# Patient Record
Sex: Male | Born: 1950 | Race: White | Hispanic: No | Marital: Married | Smoking: Former smoker
Health system: Southern US, Community
[De-identification: ages and names within clinical notes are randomized; demographics above are authoritative.]

## PROBLEM LIST (undated history)

## (undated) DIAGNOSIS — M1990 Unspecified osteoarthritis, unspecified site: Secondary | ICD-10-CM

## (undated) DIAGNOSIS — I639 Cerebral infarction, unspecified: Secondary | ICD-10-CM

## (undated) DIAGNOSIS — I10 Essential (primary) hypertension: Secondary | ICD-10-CM

## (undated) DIAGNOSIS — G629 Polyneuropathy, unspecified: Secondary | ICD-10-CM

## (undated) DIAGNOSIS — G4730 Sleep apnea, unspecified: Secondary | ICD-10-CM

## (undated) DIAGNOSIS — Z8739 Personal history of other diseases of the musculoskeletal system and connective tissue: Secondary | ICD-10-CM

## (undated) DIAGNOSIS — E119 Type 2 diabetes mellitus without complications: Secondary | ICD-10-CM

## (undated) DIAGNOSIS — I4891 Unspecified atrial fibrillation: Secondary | ICD-10-CM

## (undated) DIAGNOSIS — I213 ST elevation (STEMI) myocardial infarction of unspecified site: Secondary | ICD-10-CM

## (undated) DIAGNOSIS — E7800 Pure hypercholesterolemia, unspecified: Secondary | ICD-10-CM

## (undated) DIAGNOSIS — I472 Ventricular tachycardia, unspecified: Secondary | ICD-10-CM

## (undated) DIAGNOSIS — H3320 Serous retinal detachment, unspecified eye: Secondary | ICD-10-CM

## (undated) DIAGNOSIS — I739 Peripheral vascular disease, unspecified: Secondary | ICD-10-CM

## (undated) DIAGNOSIS — C439 Malignant melanoma of skin, unspecified: Secondary | ICD-10-CM

## (undated) DIAGNOSIS — M549 Dorsalgia, unspecified: Secondary | ICD-10-CM

## (undated) DIAGNOSIS — F419 Anxiety disorder, unspecified: Secondary | ICD-10-CM

## (undated) DIAGNOSIS — N189 Chronic kidney disease, unspecified: Secondary | ICD-10-CM

## (undated) DIAGNOSIS — I5021 Acute systolic (congestive) heart failure: Secondary | ICD-10-CM

## (undated) DIAGNOSIS — I2510 Atherosclerotic heart disease of native coronary artery without angina pectoris: Secondary | ICD-10-CM

## (undated) DIAGNOSIS — I219 Acute myocardial infarction, unspecified: Secondary | ICD-10-CM

## (undated) HISTORY — DX: Malignant melanoma of skin, unspecified: C43.9

## (undated) HISTORY — PX: APPENDECTOMY: SHX54

## (undated) HISTORY — DX: Peripheral vascular disease, unspecified: I73.9

## (undated) HISTORY — PX: RETINAL DETACHMENT SURGERY: SHX105

## (undated) HISTORY — DX: Serous retinal detachment, unspecified eye: H33.20

---

## 2004-07-25 ENCOUNTER — Ambulatory Visit (HOSPITAL_COMMUNITY): Admission: RE | Admit: 2004-07-25 | Discharge: 2004-07-25 | Payer: Self-pay | Admitting: Family Medicine

## 2005-04-05 ENCOUNTER — Ambulatory Visit (HOSPITAL_COMMUNITY): Admission: RE | Admit: 2005-04-05 | Discharge: 2005-04-05 | Payer: Self-pay | Admitting: Family Medicine

## 2007-01-28 ENCOUNTER — Emergency Department (HOSPITAL_COMMUNITY): Admission: EM | Admit: 2007-01-28 | Discharge: 2007-01-28 | Payer: Self-pay | Admitting: Emergency Medicine

## 2010-06-19 NOTE — Consult Note (Signed)
Xavier Caldwell, Xavier NO.:  [id number]   MEDICAL RECORD NO.:  [id number]          PATIENT TYPE:  EMS   LOCATION:  ED                            FACILITY:  [facility]   PHYSICIAN:  Dalia Heading, M.D.  DATE OF BIRTH:  May 27, 1950   DATE OF CONSULTATION:  01/28/2007  DATE OF DISCHARGE:                                 CONSULTATION   AGE:  60 years old.   REASON FOR CONSULTATION:  Perirectal abscess.   REFERRING PHYSICIAN:  [facility].   HISTORY OF PRESENT ILLNESS:  The patient is a 60 year old white male who  presented in the emergency room with an enlarging perirectal abscess.  He had been seen by Dr. Phillips Odor in the office earlier this week.  He  states his blood sugars have been elevated recently as he has not been  taking his medications.   PAST MEDICAL AND SURGICAL HISTORY:  Are noted on the chart.   LABORATORY DATA:  Noted on the chart.   PHYSICAL EXAMINATION:  On examination, a large perirectal abscess is  noted on the left side of the anus.   PROCEDURE NOTE:  Informed consent was obtained.  Incision and drainage  of the abscess was performed.  Cultures were taken.  The abscess cavity  was fully evacuated.  A dressing was then applied.  This was performed  using a topical anesthetic and prepped with Betadine.  He tolerated the  procedure well.   IMPRESSION:  Perirectal abscess.   PLAN:  Patient has been told to soak in a tub with Epsom salt twice a  day.  He will be started on:  1. Ciprofloxacin 500 mg p.o. b.i.d. x10 days.  2. Flagyl 250 mg p.o. 3 times a day x10 days.  3. Vicodin 1-2 tablets p.o. q.4 h p.r.n. pain.   He is to follow-up Dr. Franky Macho on February 03, 2007.  He is to  return to the emergency room should his condition worsen.      Dalia Heading, M.D.  Electronically Signed     MAJ/MEDQ  D:  01/28/2007  T:  01/28/2007  Job:  [id number]   cc:   Corrie Mckusick, M.D.  Fax: [phone]

## 2010-11-09 LAB — BASIC METABOLIC PANEL
CO2: 16 — ABNORMAL LOW
Calcium: 9.2
Chloride: 98
Creatinine, Ser: 1.24
GFR calc Af Amer: >60
Glucose, Bld: 323 — ABNORMAL HIGH
Potassium: 3 — ABNORMAL LOW

## 2010-11-09 LAB — BLOOD GAS, ARTERIAL
Acid-base deficit: 9 — ABNORMAL HIGH
Bicarbonate: 14.9 — ABNORMAL LOW
O2 Saturation: 97.7
TCO2: 13.1
pO2, Arterial: 105 — ABNORMAL HIGH

## 2010-11-09 LAB — CBC
Hemoglobin: 14.8
MCHC: 34.4
RBC: 4.85
WBC: 15.5 — ABNORMAL HIGH

## 2010-11-09 LAB — CULTURE, ROUTINE-ABSCESS

## 2010-11-09 LAB — DIFFERENTIAL
Basophils Absolute: 0
Basophils Relative: 0
Eosinophils Absolute: 0 — ABNORMAL LOW
Eosinophils Relative: 0
Lymphocytes Relative: 11 — ABNORMAL LOW

## 2012-02-09 ENCOUNTER — Encounter (HOSPITAL_COMMUNITY): Payer: Self-pay | Admitting: Emergency Medicine

## 2012-02-09 ENCOUNTER — Emergency Department (HOSPITAL_COMMUNITY)
Admission: EM | Admit: 2012-02-09 | Discharge: 2012-02-09 | Disposition: A | Discharge status: Home / Self Care | Payer: BC Managed Care – PPO | Attending: Emergency Medicine | Admitting: Emergency Medicine

## 2012-02-09 DIAGNOSIS — Z79899 Other long term (current) drug therapy: Secondary | ICD-10-CM | POA: Insufficient documentation

## 2012-02-09 DIAGNOSIS — E7800 Pure hypercholesterolemia, unspecified: Secondary | ICD-10-CM | POA: Insufficient documentation

## 2012-02-09 DIAGNOSIS — E119 Type 2 diabetes mellitus without complications: Secondary | ICD-10-CM | POA: Insufficient documentation

## 2012-02-09 DIAGNOSIS — M5430 Sciatica, unspecified side: Secondary | ICD-10-CM | POA: Insufficient documentation

## 2012-02-09 DIAGNOSIS — M545 Low back pain, unspecified: Secondary | ICD-10-CM | POA: Insufficient documentation

## 2012-02-09 DIAGNOSIS — F17200 Nicotine dependence, unspecified, uncomplicated: Secondary | ICD-10-CM | POA: Insufficient documentation

## 2012-02-09 HISTORY — DX: Pure hypercholesterolemia, unspecified: E78.00

## 2012-02-09 HISTORY — DX: Type 2 diabetes mellitus without complications: E11.9

## 2012-02-09 MED ORDER — PREDNISONE 50 MG PO TABS
60.0000 mg | ORAL_TABLET | Freq: Once | ORAL | Status: AC
  Administered 2012-02-09: 60 mg via ORAL
  Filled 2012-02-09: qty 1

## 2012-02-09 MED ORDER — PREDNISONE 10 MG PO TABS: 20.0000 mg | ORAL_TABLET | Freq: Every day | ORAL | Status: DC

## 2012-02-09 MED ORDER — HYDROCODONE-ACETAMINOPHEN 5-325 MG PO TABS: 1.0000 | ORAL_TABLET | ORAL | Status: AC | PRN

## 2012-02-09 MED ORDER — HYDROCODONE-ACETAMINOPHEN 5-325 MG PO TABS
2.0000 | ORAL_TABLET | Freq: Once | ORAL | Status: AC
  Administered 2012-02-09: 2 via ORAL
  Filled 2012-02-09: qty 2

## 2012-02-09 NOTE — ED Notes (Signed)
Patient states he was lifting a TV onto a blanket and felt a pain in the right side of his lower back.  Patient states he went to work and worked 12 hours; states pain has gotten progressively worse.

## 2012-02-09 NOTE — ED Provider Notes (Signed)
History     CSN: [id number]  Arrival date & time 02/09/12  0405   First MD Initiated Contact with Patient 02/09/12 [phone]      Chief Complaint  Patient presents with  . Back Pain    (Consider location/radiation/quality/duration/timing/severity/associated sxs/prior treatment) HPI Xavier Caldwell is a 62 y.o. male who presents to the Emergency Department complaining of back pain that began on Friday when he tried to lift a TV. Friday he had right sided back pain and was able to go to work. When he tried to get out of bed Saturday morning he had a great deal of difficulty due to the pain. Pain extends across his lower back and goes down his right buttock and leg. Pain is sharp and increases with walking. Nothing makes it better.    Past Medical History  Diagnosis Date  . Diabetes mellitus without complication   . High cholesterol     Past Surgical History  Procedure Date  . Appendectomy     No family history on file.  History  Substance Use Topics  . Smoking status: Current Every Day Smoker  . Smokeless tobacco: Not on file  . Alcohol Use: Yes      Review of Systems  Constitutional: Negative for fever.       10 Systems reviewed and are negative for acute change except as noted in the HPI.  HENT: Negative for congestion.   Eyes: Negative for discharge and redness.  Respiratory: Negative for cough and shortness of breath.   Cardiovascular: Negative for chest pain.  Gastrointestinal: Negative for vomiting and abdominal pain.  Musculoskeletal: Positive for back pain.  Skin: Negative for rash.  Neurological: Negative for syncope, numbness and headaches.  Psychiatric/Behavioral:       No behavior change.    Allergies  Review of patient's allergies indicates no known allergies.  Home Medications   Current Outpatient Rx  Name  Route  Sig  Dispense  Refill  . GLYBURIDE-METFORMIN 2.5-500 MG PO TABS   Oral   Take 1 tablet by mouth daily with breakfast.         .  LISINOPRIL 20 MG PO TABS   Oral   Take 20 mg by mouth daily.         Marland Kitchen SIMVASTATIN 10 MG PO TABS   Oral   Take 10 mg by mouth at bedtime.           BP 154/77  Pulse 118  Temp 97.7 F (36.5 C) (Oral)  Resp 20  Ht 5\' 11"  (1.803 m)  Wt 217 lb (98.431 kg)  BMI 30.27 kg/m2  SpO2 98%  Physical Exam  Nursing note and vitals reviewed. Constitutional: He appears well-developed and well-nourished.       Awake, alert, nontoxic appearance.  HENT:  Head: Atraumatic.  Eyes: Right eye exhibits no discharge. Left eye exhibits no discharge.  Neck: Neck supple.  Pulmonary/Chest: Effort normal and breath sounds normal. He exhibits no tenderness.  Abdominal: Soft. Bowel sounds are normal. There is no tenderness. There is no rebound.  Musculoskeletal: He exhibits no tenderness.       Baseline ROM, no obvious new focal weakness.No spinal tenderness to percussion. Mild lumbar paraspinal muscle tenderness with palpation, right greater than left. Positive straight leg raise on the right.  Neurological:       Mental status and motor strength appears baseline for patient and situation.  Skin: No rash noted.  Psychiatric: He has a normal mood and affect.  ED Course  Procedures (including critical care time)     MDM  Patient presents with lower back pain and sciatica after trying to lift a TV. Initiated steroid therapy and was given an analgesic. Pt stable in ED with no significant deterioration in condition.The patient appears reasonably screened and/or stabilized for discharge and I doubt any other medical condition or other [facility] requiring further screening, evaluation, or treatment in the ED at this time prior to discharge.  MDM Reviewed: nursing note and vitals           Nicoletta Dress. Colon Branch, MD 02/09/12 [phone]

## 2012-03-24 ENCOUNTER — Ambulatory Visit (HOSPITAL_COMMUNITY)
Admission: RE | Admit: 2012-03-24 | Discharge: 2012-03-24 | Disposition: A | Discharge status: Home / Self Care | Payer: BC Managed Care – PPO | Source: Ambulatory Visit | Attending: Family Medicine | Admitting: Family Medicine

## 2012-03-24 ENCOUNTER — Other Ambulatory Visit (HOSPITAL_COMMUNITY): Payer: Self-pay | Admitting: Family Medicine

## 2012-03-24 DIAGNOSIS — M545 Low back pain, unspecified: Secondary | ICD-10-CM

## 2012-04-03 ENCOUNTER — Ambulatory Visit (HOSPITAL_COMMUNITY): Payer: BC Managed Care – PPO

## 2012-04-03 ENCOUNTER — Other Ambulatory Visit (HOSPITAL_COMMUNITY): Payer: Self-pay | Admitting: Orthopedic Surgery

## 2012-04-03 DIAGNOSIS — M545 Low back pain, unspecified: Secondary | ICD-10-CM

## 2012-04-06 ENCOUNTER — Ambulatory Visit (HOSPITAL_COMMUNITY)
Admission: RE | Admit: 2012-04-06 | Discharge: 2012-04-06 | Disposition: A | Discharge status: Home / Self Care | Payer: BC Managed Care – PPO | Source: Ambulatory Visit | Attending: Orthopedic Surgery | Admitting: Orthopedic Surgery

## 2012-04-06 DIAGNOSIS — M545 Low back pain, unspecified: Secondary | ICD-10-CM | POA: Insufficient documentation

## 2012-04-06 DIAGNOSIS — M5126 Other intervertebral disc displacement, lumbar region: Secondary | ICD-10-CM | POA: Insufficient documentation

## 2012-04-06 DIAGNOSIS — M79609 Pain in unspecified limb: Secondary | ICD-10-CM | POA: Insufficient documentation

## 2014-01-19 ENCOUNTER — Encounter: Payer: BC Managed Care – PPO | Attending: "Endocrinology | Admitting: Nutrition

## 2014-01-19 VITALS — Ht 71.0 in | Wt 199.0 lb

## 2014-01-19 DIAGNOSIS — E1165 Type 2 diabetes mellitus with hyperglycemia: Secondary | ICD-10-CM

## 2014-01-19 DIAGNOSIS — IMO0002 Reserved for concepts with insufficient information to code with codable children: Secondary | ICD-10-CM

## 2014-01-19 DIAGNOSIS — E118 Type 2 diabetes mellitus with unspecified complications: Secondary | ICD-10-CM | POA: Diagnosis present

## 2014-01-19 DIAGNOSIS — Z713 Dietary counseling and surveillance: Secondary | ICD-10-CM | POA: Insufficient documentation

## 2014-01-19 NOTE — Progress Notes (Signed)
  Medical Nutrition Therapy:  Appt start time: 1430 end time:  1610.   Assessment:  Primary concerns today: Diabetes.  Most recent A1C was 8.6% today. He comes in with his wife. Has back problems that limits his physical movement. He is taking 2 of of Metformin daily and 300 mg of Invokana. Occasionally gets dizzy at times. Complains of going to the bathroom a lot and is drinking a lot of water to replace fluid losses from [place]. Had been on prednisone in the recent past and may have contributed to elevated A1C. Past A1C was 7.4%. Sees Dr. [name] for DM. Is trying to eat right but his blood sugars have not improved he noted.  Preferred Learning Style:     No preference indicated   Learning Readiness:     Ready  Change in progress   MEDICATIONS: See list   DIETARY INTAKE:  24-hr recall:  B ( AM): 1 egg, 1 slice toast and 1 slice bacon, water, coffee Snk ( AM): none  L ( PM): ham sandwich and carrots, water Snk ( PM)none D ( PM): hamburger patty, green beans, carrots, water Snk ( PM): fruit sometimes Beverages: water  Usual physical activity:Very limited due to back pain  Estimated energy needs: 1800 calories 180 g carbohydrates 135 g protein 50 g fat  Progress Towards Goal(s):  In progress.   Nutritional Diagnosis:  NB-1.1 Food and nutrition-related knowledge deficit As related to Diabetes.  As evidenced by A1C 8.6%.    Intervention:  Nutrition Counseling for diabetes, target ranges for blood sugars, meal planning, and complications related to DM. Plan:  Aim for 3-4 Carb Choices per meal (45-60 grams) +/- 1 either way  Avoid snacks between meals  Include protein in moderation with your meals and snacks Consider reading food labels for Total Carbohydrate and Fat Grams of foods Try to do chair exercises as tolerated Check blood sugars fasting and 2 hours after largest meal daily and record on BS log. Keep a food journal and bring at next visit. Consider taking  medication of Metformin and Invokana as directed by MD Goal: Get A1C down to 7%  or lower in three months.    Teaching Method Utilized:  Visual Auditory Hands on  Handouts given during visit include: The Plate Method Carb Counting and Food Label handouts Meal Plan Card   Barriers to learning/adherence to lifestyle change: back problems limits physical activity.  Demonstrated degree of understanding via:  Teach Back   Monitoring/Evaluation:  Dietary intake, exercise, meal planning, SBG, and body weight in 1 month(s).

## 2014-01-20 NOTE — Patient Instructions (Signed)
Plan:  Aim for 3-4 Carb Choices per meal (45-60 grams) +/- 1 either way  Avoid snacks between meals  Include protein in moderation with your meals and snacks Consider reading food labels for Total Carbohydrate and Fat Grams of foods Try to do chair exercises as tolerated Check blood sugars fasting and 2 hours after largest meal daily and record on BS log. Keep a food journal and bring at next visit. Consider taking medication of Metformin and Invokana as directed by MD Goal: Get A1C down to 7%  or lower in three months

## 2014-01-21 ENCOUNTER — Ambulatory Visit: Payer: BC Managed Care – PPO | Admitting: Nutrition

## 2014-02-24 ENCOUNTER — Ambulatory Visit: Payer: BC Managed Care – PPO | Admitting: Nutrition

## 2014-03-10 ENCOUNTER — Telehealth: Payer: Self-pay | Admitting: Neurology

## 2014-03-10 NOTE — Telephone Encounter (Signed)
Pt canceled new patient appt and does not feel like he needs to be seen at this time notified the referring dr

## 2014-04-19 ENCOUNTER — Encounter (HOSPITAL_COMMUNITY): Payer: Self-pay

## 2014-04-19 ENCOUNTER — Other Ambulatory Visit: Payer: Self-pay

## 2014-04-19 ENCOUNTER — Encounter (HOSPITAL_COMMUNITY)
Admission: RE | Admit: 2014-04-19 | Discharge: 2014-04-19 | Disposition: A | Discharge status: Home / Self Care | Payer: 59 | Source: Ambulatory Visit | Attending: Ophthalmology | Admitting: Ophthalmology

## 2014-04-19 DIAGNOSIS — Z01810 Encounter for preprocedural cardiovascular examination: Secondary | ICD-10-CM | POA: Insufficient documentation

## 2014-04-19 DIAGNOSIS — Z01812 Encounter for preprocedural laboratory examination: Secondary | ICD-10-CM | POA: Diagnosis not present

## 2014-04-19 DIAGNOSIS — H269 Unspecified cataract: Secondary | ICD-10-CM | POA: Insufficient documentation

## 2014-04-19 HISTORY — DX: Sleep apnea, unspecified: G47.30

## 2014-04-19 HISTORY — DX: Polyneuropathy, unspecified: G62.9

## 2014-04-19 HISTORY — DX: Dorsalgia, unspecified: M54.9

## 2014-04-19 HISTORY — DX: Essential (primary) hypertension: I10

## 2014-04-19 LAB — BASIC METABOLIC PANEL
Anion gap: 11 (ref 5–15)
BUN: 14 mg/dL (ref 6–23)
CALCIUM: 9.6 mg/dL (ref 8.4–10.5)
CHLORIDE: 103 mmol/L (ref 96–112)
CO2: 22 mmol/L (ref 19–32)
CREATININE: 0.66 mg/dL (ref 0.50–1.35)
GFR calc non Af Amer: >90 mL/min (ref >90)
Glucose, Bld: 123 mg/dL — ABNORMAL HIGH (ref 70–99)
Potassium: 4.5 mmol/L (ref 3.5–5.1)
Sodium: 136 mmol/L (ref 135–145)

## 2014-04-19 LAB — CBC
HCT: 43.6 % (ref 39.0–52.0)
Hemoglobin: 14.9 g/dL (ref 13.0–17.0)
MCH: 30.4 pg (ref 26.0–34.0)
MCHC: 34.2 g/dL (ref 30.0–36.0)
MCV: 89 fL (ref 78.0–100.0)
PLATELETS: 225 10*3/uL (ref 150–400)
RBC: 4.9 MIL/uL (ref 4.22–5.81)
RDW: 11.9 % (ref 11.5–15.5)
WBC: 12.1 10*3/uL — ABNORMAL HIGH (ref 4.0–10.5)

## 2014-04-19 NOTE — Progress Notes (Signed)
   04/19/14 1451  OBSTRUCTIVE SLEEP APNEA  Have you ever been diagnosed with sleep apnea through a sleep study? No  Do you snore loudly (loud enough to be heard through closed doors)?  0  Do you often feel tired, fatigued, or sleepy during the daytime? 1  Has anyone observed you stop breathing during your sleep? 0  Do you have, or are you being treated for high blood pressure? 1  BMI more than 35 kg/m2? 0  Age over 62 years old? 1  Neck circumference greater than 40 cm/16 inches? 0  Gender: 1  Obstructive Sleep Apnea Score 4

## 2014-04-19 NOTE — Patient Instructions (Signed)
Your procedure is scheduled on: 04/25/2014  Report to [facility] at  800  AM.  Call this number if you have problems the morning of surgery: [phone]   Do not eat food or drink liquids :After Midnight.      Take these medicines the morning of surgery with A SIP OF WATER: cymbalta, hydrocodone, lisinopril, lyrica   Do not wear jewelry, make-up or nail polish.  Do not wear lotions, powders, or perfumes.  Do not shave 48 hours prior to surgery.  Do not bring valuables to the hospital.  Contacts, dentures or bridgework may not be worn into surgery.  Leave suitcase in the car. After surgery it may be brought to your room.  For patients admitted to the hospital, checkout time is 11:00 AM the day of discharge.   Patients discharged the day of surgery will not be allowed to drive home.  :     Please read over the following fact sheets that you were given: Coughing and Deep Breathing, Surgical Site Infection Prevention, Anesthesia Post-op Instructions and Care and Recovery After Surgery    Cataract A cataract is a clouding of the lens of the eye. When a lens becomes cloudy, vision is reduced based on the degree and nature of the clouding. Many cataracts reduce vision to some degree. Some cataracts make people more near-sighted as they develop. Other cataracts increase glare. Cataracts that are ignored and become worse can sometimes look white. The white color can be seen through the pupil. CAUSES   Aging. However, cataracts may occur at any age, even in newborns.   Certain drugs.   Trauma to the eye.   Certain diseases such as diabetes.   Specific eye diseases such as chronic inflammation inside the eye or a sudden attack of a rare form of glaucoma.   Inherited or acquired medical problems.  SYMPTOMS   Gradual, progressive drop in vision in the affected eye.   Severe, rapid visual loss. This most often happens when trauma is the cause.  DIAGNOSIS  To detect a cataract, an eye  doctor examines the lens. Cataracts are best diagnosed with an exam of the eyes with the pupils enlarged (dilated) by drops.  TREATMENT  For an early cataract, vision may improve by using different eyeglasses or stronger lighting. If that does not help your vision, surgery is the only effective treatment. A cataract needs to be surgically removed when vision loss interferes with your everyday activities, such as driving, reading, or watching TV. A cataract may also have to be removed if it prevents examination or treatment of another eye problem. Surgery removes the cloudy lens and usually replaces it with a substitute lens (intraocular lens, IOL).  At a time when both you and your doctor agree, the cataract will be surgically removed. If you have cataracts in both eyes, only one is usually removed at a time. This allows the operated eye to heal and be out of danger from any possible problems after surgery (such as infection or poor wound healing). In rare cases, a cataract may be doing damage to your eye. In these cases, your caregiver may advise surgical removal right away. The vast majority of people who have cataract surgery have better vision afterward. HOME CARE INSTRUCTIONS  If you are not planning surgery, you may be asked to do the following:  Use different eyeglasses.   Use stronger or brighter lighting.   Ask your eye doctor about reducing your medicine dose or changing  medicines if it is thought that a medicine caused your cataract. Changing medicines does not make the cataract go away on its own.   Become familiar with your surroundings. Poor vision can lead to injury. Avoid bumping into things on the affected side. You are at a higher risk for tripping or falling.   Exercise extreme care when driving or operating machinery.   Wear sunglasses if you are sensitive to bright light or experiencing problems with glare.  SEEK IMMEDIATE MEDICAL CARE IF:   You have a worsening or sudden  vision loss.   You notice redness, swelling, or increasing pain in the eye.   You have a fever.  Document Released: 01/21/2005 Document Revised: 01/10/2011 Document Reviewed: 09/14/2010 [facility] Patient Information 2012 [place].PATIENT INSTRUCTIONS POST-ANESTHESIA  IMMEDIATELY FOLLOWING SURGERY:  Do not drive or operate machinery for the first twenty four hours after surgery.  Do not make any important decisions for twenty four hours after surgery or while taking narcotic pain medications or sedatives.  If you develop intractable nausea and vomiting or a severe headache please notify your doctor immediately.  FOLLOW-UP:  Please make an appointment with your surgeon as instructed. You do not need to follow up with anesthesia unless specifically instructed to do so.  WOUND CARE INSTRUCTIONS (if applicable):  Keep a dry clean dressing on the anesthesia/puncture wound site if there is drainage.  Once the wound has quit draining you may leave it open to air.  Generally you should leave the bandage intact for twenty four hours unless there is drainage.  If the epidural site drains for more than 36-48 hours please call the anesthesia department.  QUESTIONS?:  Please feel free to call your physician or the hospital operator if you have any questions, and they will be happy to assist you.

## 2014-04-22 MED ORDER — LIDOCAINE HCL 3.5 % OP GEL
OPHTHALMIC | Status: AC
  Filled 2014-04-22: qty 1

## 2014-04-22 MED ORDER — TETRACAINE HCL 0.5 % OP SOLN
OPHTHALMIC | Status: AC
  Filled 2014-04-22: qty 2

## 2014-04-22 MED ORDER — CYCLOPENTOLATE-PHENYLEPHRINE OP SOLN OPTIME - NO CHARGE
OPHTHALMIC | Status: AC
  Filled 2014-04-22: qty 2

## 2014-04-25 ENCOUNTER — Ambulatory Visit (HOSPITAL_COMMUNITY): Payer: 59 | Admitting: Anesthesiology

## 2014-04-25 ENCOUNTER — Encounter (HOSPITAL_COMMUNITY)
Admission: RE | Disposition: A | Discharge status: Home / Self Care | Payer: Self-pay | Source: Ambulatory Visit | Attending: Ophthalmology

## 2014-04-25 ENCOUNTER — Encounter (HOSPITAL_COMMUNITY): Payer: Self-pay | Admitting: *Deleted

## 2014-04-25 ENCOUNTER — Ambulatory Visit (HOSPITAL_COMMUNITY)
Admission: RE | Admit: 2014-04-25 | Discharge: 2014-04-25 | Disposition: A | Discharge status: Home / Self Care | Payer: 59 | Source: Ambulatory Visit | Attending: Ophthalmology | Admitting: Ophthalmology

## 2014-04-25 ENCOUNTER — Ambulatory Visit: Payer: Self-pay | Admitting: Neurology

## 2014-04-25 DIAGNOSIS — H2511 Age-related nuclear cataract, right eye: Secondary | ICD-10-CM | POA: Insufficient documentation

## 2014-04-25 DIAGNOSIS — Z87891 Personal history of nicotine dependence: Secondary | ICD-10-CM | POA: Insufficient documentation

## 2014-04-25 DIAGNOSIS — I10 Essential (primary) hypertension: Secondary | ICD-10-CM | POA: Insufficient documentation

## 2014-04-25 DIAGNOSIS — E119 Type 2 diabetes mellitus without complications: Secondary | ICD-10-CM | POA: Diagnosis not present

## 2014-04-25 HISTORY — PX: CATARACT EXTRACTION W/PHACO: SHX586

## 2014-04-25 LAB — GLUCOSE, CAPILLARY: GLUCOSE-CAPILLARY: 162 mg/dL — AB (ref 70–99)

## 2014-04-25 SURGERY — PHACOEMULSIFICATION, CATARACT, WITH IOL INSERTION
Anesthesia: Monitor Anesthesia Care | Site: Eye | Laterality: Right

## 2014-04-25 MED ORDER — EPINEPHRINE HCL 1 MG/ML IJ SOLN
INTRAMUSCULAR | Status: AC
  Filled 2014-04-25: qty 1

## 2014-04-25 MED ORDER — BSS IO SOLN
INTRAOCULAR | Status: DC | PRN
  Administered 2014-04-25: 15 mL

## 2014-04-25 MED ORDER — TETRACAINE HCL 0.5 % OP SOLN
1.0000 [drp] | OPHTHALMIC | Status: AC
  Administered 2014-04-25 (×3): 1 [drp] via OPHTHALMIC

## 2014-04-25 MED ORDER — NA HYALUR & NA CHOND-NA HYALUR 0.55-0.5 ML IO KIT
PACK | INTRAOCULAR | Status: DC | PRN
  Administered 2014-04-25: 1 via OPHTHALMIC

## 2014-04-25 MED ORDER — CYCLOPENTOLATE-PHENYLEPHRINE 0.2-1 % OP SOLN
1.0000 [drp] | OPHTHALMIC | Status: AC
  Administered 2014-04-25 (×3): 1 [drp] via OPHTHALMIC

## 2014-04-25 MED ORDER — FENTANYL CITRATE 0.05 MG/ML IJ SOLN
INTRAMUSCULAR | Status: AC
  Filled 2014-04-25: qty 2

## 2014-04-25 MED ORDER — MIDAZOLAM HCL 2 MG/2ML IJ SOLN
1.0000 mg | INTRAMUSCULAR | Status: DC | PRN
  Administered 2014-04-25: 2 mg via INTRAVENOUS

## 2014-04-25 MED ORDER — MIDAZOLAM HCL 5 MG/5ML IJ SOLN
INTRAMUSCULAR | Status: DC | PRN
  Administered 2014-04-25 (×2): 1 mg via INTRAVENOUS

## 2014-04-25 MED ORDER — LIDOCAINE HCL 3.5 % OP GEL
OPHTHALMIC | Status: DC | PRN
  Administered 2014-04-25: 1 via OPHTHALMIC

## 2014-04-25 MED ORDER — MIDAZOLAM HCL 2 MG/2ML IJ SOLN
INTRAMUSCULAR | Status: AC
  Filled 2014-04-25: qty 2

## 2014-04-25 MED ORDER — TETRACAINE 0.5 % OP SOLN OPTIME - NO CHARGE
OPHTHALMIC | Status: DC | PRN
  Administered 2014-04-25: 1 [drp] via OPHTHALMIC

## 2014-04-25 MED ORDER — LACTATED RINGERS IV SOLN
INTRAVENOUS | Status: DC
  Administered 2014-04-25: 09:00:00 via INTRAVENOUS

## 2014-04-25 MED ORDER — POVIDONE-IODINE 5 % OP SOLN
OPHTHALMIC | Status: DC | PRN
  Administered 2014-04-25: 1 via OPHTHALMIC

## 2014-04-25 MED ORDER — PHENYLEPHRINE-KETOROLAC 1-0.3 % IO SOLN
Freq: Once | INTRAOCULAR | Status: AC
  Administered 2014-04-25: 500 mL via OPHTHALMIC

## 2014-04-25 MED ORDER — FENTANYL CITRATE 0.05 MG/ML IJ SOLN
25.0000 ug | INTRAMUSCULAR | Status: AC
  Administered 2014-04-25 (×2): 25 ug via INTRAVENOUS

## 2014-04-25 SURGICAL SUPPLY — 8 items
CLOTH BEACON ORANGE TIMEOUT ST (SAFETY) IMPLANT
GLOVE BIOGEL PI IND STRL 7.0 (GLOVE) IMPLANT
GLOVE BIOGEL PI INDICATOR 7.0 (GLOVE)
GLOVE EXAM NITRILE MD LF STRL (GLOVE) IMPLANT
INST SET CATARACT ~~LOC~~ (KITS) ×2 IMPLANT
LENS ALC ACRYL/TECN (Ophthalmic Related) ×2 IMPLANT
PAD ARMBOARD 7.5X6 YLW CONV (MISCELLANEOUS) IMPLANT
WATER STERILE IRR 250ML POUR (IV SOLUTION) IMPLANT

## 2014-04-25 NOTE — Discharge Instructions (Signed)
GIBBS NAUGLE 04/25/2014 Dr. Iona Hansen Post operative Instructions for Cataract Patients  These instructions are for Xavier Caldwell and pertain to the operative eye.  1.  Resume your normal diet and previous oral medicines.  2. Your Follow-up appointment is at Dr. Iona Hansen' office in [place] on 04/26/14 @ 9:15am  3. You may leave the hospital when your driver is present and your nurse releases you.  4. Begin Pred Forte (prednisolone acetate 1%), Acular LS (ketorolac tromethamine .4%) and Gatifloxacin 0.5% eye drops; 1 drop each 4 times daily to operative eye. Moxifloxacin 0.5% may be substituted for Gatifloxacin using the same instructions.  68. Page Dr. Iona Hansen via beeper [phone] for significant pain in or around operative eye that is not relieved by Tylenol.  6. If you took Plavix before surgery, restart it at the usual dose on the evening of surgery.  7. Wear dark glasses as necessary for excessive light sensitivity.  8. Do no forcefully rub you your operative eye.  9. Keep your operative eye dry for 1 week. You may gently clean your eyelids with a damp washcloth.  10. You may resume normal occupational activities in one week and resume driving as tolerated after the first post operative visit.  11. It is normal to have blurred vision and a scratchy sensation following surgery.  Dr. Iona Hansen: [phone]

## 2014-04-25 NOTE — Transfer of Care (Signed)
Immediate Anesthesia Transfer of Care Note  Patient: Xavier Caldwell  Procedure(s) Performed: Procedure(s) with comments: CATARACT EXTRACTION PHACO AND INTRAOCULAR LENS PLACEMENT (IOC) (Right) - CDE:4.70  Patient Location: Short Stay  Anesthesia Type:MAC  Level of Consciousness: awake  Airway & Oxygen Therapy: Patient Spontanous Breathing  Post-op Assessment: Report given to RN  Post vital signs: Reviewed  Last Vitals:  Filed Vitals:   04/25/14 0925  BP: 125/67  Pulse:   Temp:   Resp: 13    Complications: No apparent anesthesia complications

## 2014-04-25 NOTE — Anesthesia Postprocedure Evaluation (Signed)
  Anesthesia Post-op Note  Patient: Xavier Caldwell  Procedure(s) Performed: Procedure(s) with comments: CATARACT EXTRACTION PHACO AND INTRAOCULAR LENS PLACEMENT (IOC) (Right) - CDE:4.70  Patient Location: Short Stay  Anesthesia Type:MAC  Level of Consciousness: awake, alert  and oriented  Airway and Oxygen Therapy: Patient Spontanous Breathing  Post-op Pain: none  Post-op Assessment: Post-op Vital signs reviewed, Patient's Cardiovascular Status Stable, Respiratory Function Stable, Patent Airway and No signs of Nausea or vomiting  Post-op Vital Signs: Reviewed and stable  Last Vitals:  Filed Vitals:   04/25/14 0925  BP: 125/67  Pulse:   Temp:   Resp: 13    Complications: No apparent anesthesia complications

## 2014-04-25 NOTE — Anesthesia Preprocedure Evaluation (Signed)
Anesthesia Evaluation  Patient identified by MRN, date of birth, ID band Patient awake    Reviewed: Allergy & Precautions, NPO status , Patient's Chart, lab work & pertinent test results  Airway Mallampati: II  TM Distance: >3 FB     Dental  (+) Teeth Intact   Pulmonary former smoker,  breath sounds clear to auscultation        Cardiovascular hypertension, Pt. on medications Rhythm:Regular Rate:Normal     Neuro/Psych    GI/Hepatic negative GI ROS,   Endo/Other  diabetes, Well Controlled, Type 2, Oral Hypoglycemic Agents  Renal/GU      Musculoskeletal   Abdominal   Peds  Hematology   Anesthesia Other Findings   Reproductive/Obstetrics                             Anesthesia Physical Anesthesia Plan  ASA: II  Anesthesia Plan: MAC   Post-op Pain Management:    Induction: Intravenous  Airway Management Planned: Nasal Cannula  Additional Equipment:   Intra-op Plan:   Post-operative Plan:   Informed Consent: I have reviewed the patients History and Physical, chart, labs and discussed the procedure including the risks, benefits and alternatives for the proposed anesthesia with the patient or authorized representative who has indicated his/her understanding and acceptance.     Plan Discussed with:   Anesthesia Plan Comments:         Anesthesia Quick Evaluation

## 2014-04-25 NOTE — Op Note (Signed)
04/25/2014  10:09 AM  PATIENT:  Xavier Caldwell  64 y.o. male  PRE-OPERATIVE DIAGNOSIS:  nuclear cataract right eye  POST-OPERATIVE DIAGNOSIS:  nuclear cataract right eye  PROCEDURE:  Procedure(s): CATARACT EXTRACTION PHACO AND INTRAOCULAR LENS PLACEMENT ([place])  SURGEON:  Surgeon(s): Williams Che, MD  ASSISTANTS: Bonney Roussel, CST   ANESTHESIA STAFF: Anesthesiologist: Lerry Liner, MD CRNA: Ollen Bowl, CRNA  ANESTHESIA:   topical and MAC  REQUESTED LENS POWER: 9.5  LENS IMPLANT INFORMATION:  Alcon [id number]  S/n [id number]  Exp 07/2015  CUMULATIVE DISSIPATED ENERGY:not recorded  INDICATIONS:see scanned office H&P  OP FINDINGS:moderate NS  COMPLICATIONS:None  PROCEDURE:  The patient was brought to the operating room in good condition.  The operative eye was prepped and draped in the usual fashion for intraocular surgery.  Lidocaine gel was dropped onto the eye.  A 2.4 mm 10 O'clock near clear corneal stepped incision and a 12 O'clock stab incision were created.  Viscoat was instilled into the anterior chamber.  The 5 mm anterior capsulorhexis was performed with a bent needle cystotome and Utrata forceps.  The lens was hydrodissected and hydrodelineated with a cannula and balanced salt solution and rotated with a Kuglen hook.  Phacoemulsification was perfomed in the divide and conquer technique.  The remaining cortex was removed with I&A and the capsular surfaces polished as necessary.  Provisc was placed into the capsular bag and the lens inserted with the Alcon inserter.  The viscoelastic was removed with I&A and the lens "rocked" into position.  The wounds were hydrated and te anterior chamber was refilled with balanced salt solution.  The wounds were checked for leakage and rehydrated as necessary.  The lid speculum and drapes were removed and the patient was transported to short stay in good condition.

## 2014-04-25 NOTE — Brief Op Note (Signed)
04/25/2014  10:09 AM  PATIENT:  Xavier Caldwell  64 y.o. male  PRE-OPERATIVE DIAGNOSIS:  nuclear cataract right eye  POST-OPERATIVE DIAGNOSIS:  nuclear cataract right eye  PROCEDURE:  Procedure(s): CATARACT EXTRACTION PHACO AND INTRAOCULAR LENS PLACEMENT ([place])  SURGEON:  Surgeon(s): Williams Che, MD  ASSISTANTS: Bonney Roussel, CST   ANESTHESIA STAFF: Anesthesiologist: Lerry Liner, MD CRNA: Ollen Bowl, CRNA  ANESTHESIA:   topical and MAC  REQUESTED LENS POWER: 9.5  LENS IMPLANT INFORMATION:  Alcon SN60WF  S/n [id number]  Exp 07/2015  CUMULATIVE DISSIPATED ENERGY:not recorded  INDICATIONS:see scanned office H&P  OP FINDINGS:moderate NS  COMPLICATIONS:None  DICTATION #: none  PLAN OF CARE: as above   PATIENT DISPOSITION:  Short Stay

## 2014-04-25 NOTE — H&P (Signed)
I have reviewed the pre printed H&P, the patient was re-examined, and I have identified no significant interval changes in the patient's medical condition.  There is no change in the plan of care since the history and physical of record. 

## 2014-04-26 ENCOUNTER — Encounter (HOSPITAL_COMMUNITY): Payer: Self-pay | Admitting: Ophthalmology

## 2014-07-06 ENCOUNTER — Encounter (HOSPITAL_COMMUNITY): Payer: Self-pay

## 2014-07-06 ENCOUNTER — Encounter (HOSPITAL_COMMUNITY)
Admission: RE | Admit: 2014-07-06 | Discharge: 2014-07-06 | Disposition: A | Discharge status: Home / Self Care | Payer: 59 | Source: Ambulatory Visit | Attending: Ophthalmology | Admitting: Ophthalmology

## 2014-07-06 DIAGNOSIS — H2512 Age-related nuclear cataract, left eye: Secondary | ICD-10-CM | POA: Diagnosis not present

## 2014-07-06 DIAGNOSIS — Z01818 Encounter for other preprocedural examination: Secondary | ICD-10-CM | POA: Insufficient documentation

## 2014-07-06 HISTORY — DX: Anxiety disorder, unspecified: F41.9

## 2014-07-06 HISTORY — DX: Personal history of other diseases of the musculoskeletal system and connective tissue: Z87.39

## 2014-07-06 HISTORY — DX: Unspecified osteoarthritis, unspecified site: M19.90

## 2014-07-06 LAB — BASIC METABOLIC PANEL
ANION GAP: 10 (ref 5–15)
BUN: 10 mg/dL (ref 6–20)
CALCIUM: 9.5 mg/dL (ref 8.9–10.3)
CO2: 25 mmol/L (ref 22–32)
CREATININE: 0.69 mg/dL (ref 0.61–1.24)
Chloride: 103 mmol/L (ref 101–111)
GFR calc Af Amer: >60 mL/min (ref >60)
GFR calc non Af Amer: >60 mL/min (ref >60)
Glucose, Bld: 116 mg/dL — ABNORMAL HIGH (ref 65–99)
Potassium: 4.4 mmol/L (ref 3.5–5.1)
SODIUM: 138 mmol/L (ref 135–145)

## 2014-07-06 LAB — CBC WITH DIFFERENTIAL/PLATELET
BASOS ABS: 0.1 10*3/uL (ref 0.0–0.1)
Basophils Relative: 0 % (ref 0–1)
Eosinophils Absolute: 0.1 10*3/uL (ref 0.0–0.7)
Eosinophils Relative: 1 % (ref 0–5)
HEMATOCRIT: 42.8 % (ref 39.0–52.0)
Hemoglobin: 14.5 g/dL (ref 13.0–17.0)
LYMPHS ABS: 3.4 10*3/uL (ref 0.7–4.0)
LYMPHS PCT: 24 % (ref 12–46)
MCH: 30.3 pg (ref 26.0–34.0)
MCHC: 33.9 g/dL (ref 30.0–36.0)
MCV: 89.4 fL (ref 78.0–100.0)
MONO ABS: 0.7 10*3/uL (ref 0.1–1.0)
MONOS PCT: 5 % (ref 3–12)
NEUTROS ABS: 9.7 10*3/uL — AB (ref 1.7–7.7)
NEUTROS PCT: 70 % (ref 43–77)
Platelets: 266 10*3/uL (ref 150–400)
RBC: 4.79 MIL/uL (ref 4.22–5.81)
RDW: 13 % (ref 11.5–15.5)
WBC: 13.9 10*3/uL — ABNORMAL HIGH (ref 4.0–10.5)

## 2014-07-06 NOTE — Patient Instructions (Signed)
Your procedure is scheduled on: 07/11/2014  Report to [facility] at  700  AM.  Call this number if you have problems the morning of surgery: [phone]   Do not eat food or drink liquids :After Midnight.      Take these medicines the morning of surgery with A SIP OF WATER: cymbalta, hydrocodone, lisinopril, lyrica   Do not wear jewelry, make-up or nail polish.  Do not wear lotions, powders, or perfumes.  Do not shave 48 hours prior to surgery.  Do not bring valuables to the hospital.  Contacts, dentures or bridgework may not be worn into surgery.  Leave suitcase in the car. After surgery it may be brought to your room.  For patients admitted to the hospital, checkout time is 11:00 AM the day of discharge.   Patients discharged the day of surgery will not be allowed to drive home.  :     Please read over the following fact sheets that you were given: Coughing and Deep Breathing, Surgical Site Infection Prevention, Anesthesia Post-op Instructions and Care and Recovery After Surgery    Cataract A cataract is a clouding of the lens of the eye. When a lens becomes cloudy, vision is reduced based on the degree and nature of the clouding. Many cataracts reduce vision to some degree. Some cataracts make people more near-sighted as they develop. Other cataracts increase glare. Cataracts that are ignored and become worse can sometimes look white. The white color can be seen through the pupil. CAUSES   Aging. However, cataracts may occur at any age, even in newborns.   Certain drugs.   Trauma to the eye.   Certain diseases such as diabetes.   Specific eye diseases such as chronic inflammation inside the eye or a sudden attack of a rare form of glaucoma.   Inherited or acquired medical problems.  SYMPTOMS   Gradual, progressive drop in vision in the affected eye.   Severe, rapid visual loss. This most often happens when trauma is the cause.  DIAGNOSIS  To detect a cataract, an eye doctor  examines the lens. Cataracts are best diagnosed with an exam of the eyes with the pupils enlarged (dilated) by drops.  TREATMENT  For an early cataract, vision may improve by using different eyeglasses or stronger lighting. If that does not help your vision, surgery is the only effective treatment. A cataract needs to be surgically removed when vision loss interferes with your everyday activities, such as driving, reading, or watching TV. A cataract may also have to be removed if it prevents examination or treatment of another eye problem. Surgery removes the cloudy lens and usually replaces it with a substitute lens (intraocular lens, IOL).  At a time when both you and your doctor agree, the cataract will be surgically removed. If you have cataracts in both eyes, only one is usually removed at a time. This allows the operated eye to heal and be out of danger from any possible problems after surgery (such as infection or poor wound healing). In rare cases, a cataract may be doing damage to your eye. In these cases, your caregiver may advise surgical removal right away. The vast majority of people who have cataract surgery have better vision afterward. HOME CARE INSTRUCTIONS  If you are not planning surgery, you may be asked to do the following:  Use different eyeglasses.   Use stronger or brighter lighting.   Ask your eye doctor about reducing your medicine dose or changing  medicines if it is thought that a medicine caused your cataract. Changing medicines does not make the cataract go away on its own.   Become familiar with your surroundings. Poor vision can lead to injury. Avoid bumping into things on the affected side. You are at a higher risk for tripping or falling.   Exercise extreme care when driving or operating machinery.   Wear sunglasses if you are sensitive to bright light or experiencing problems with glare.  SEEK IMMEDIATE MEDICAL CARE IF:   You have a worsening or sudden vision  loss.   You notice redness, swelling, or increasing pain in the eye.   You have a fever.  Document Released: 01/21/2005 Document Revised: 01/10/2011 Document Reviewed: 09/14/2010 [facility] Patient Information 2012 [place].PATIENT INSTRUCTIONS POST-ANESTHESIA  IMMEDIATELY FOLLOWING SURGERY:  Do not drive or operate machinery for the first twenty four hours after surgery.  Do not make any important decisions for twenty four hours after surgery or while taking narcotic pain medications or sedatives.  If you develop intractable nausea and vomiting or a severe headache please notify your doctor immediately.  FOLLOW-UP:  Please make an appointment with your surgeon as instructed. You do not need to follow up with anesthesia unless specifically instructed to do so.  WOUND CARE INSTRUCTIONS (if applicable):  Keep a dry clean dressing on the anesthesia/puncture wound site if there is drainage.  Once the wound has quit draining you may leave it open to air.  Generally you should leave the bandage intact for twenty four hours unless there is drainage.  If the epidural site drains for more than 36-48 hours please call the anesthesia department.  QUESTIONS?:  Please feel free to call your physician or the hospital operator if you have any questions, and they will be happy to assist you.

## 2014-07-06 NOTE — Pre-Procedure Instructions (Signed)
Patient given information to sign up for my chart at home. 

## 2014-07-08 MED ORDER — TETRACAINE HCL 0.5 % OP SOLN
OPHTHALMIC | Status: AC
  Filled 2014-07-08: qty 2

## 2014-07-08 MED ORDER — CYCLOPENTOLATE-PHENYLEPHRINE OP SOLN OPTIME - NO CHARGE
OPHTHALMIC | Status: AC
  Filled 2014-07-08: qty 2

## 2014-07-08 MED ORDER — LIDOCAINE HCL 3.5 % OP GEL
OPHTHALMIC | Status: AC
  Filled 2014-07-08: qty 1

## 2014-07-11 ENCOUNTER — Ambulatory Visit (HOSPITAL_COMMUNITY): Payer: 59 | Admitting: Anesthesiology

## 2014-07-11 ENCOUNTER — Ambulatory Visit (HOSPITAL_COMMUNITY)
Admission: RE | Admit: 2014-07-11 | Discharge: 2014-07-11 | Disposition: A | Discharge status: Home / Self Care | Payer: 59 | Source: Ambulatory Visit | Attending: Ophthalmology | Admitting: Ophthalmology

## 2014-07-11 ENCOUNTER — Encounter (HOSPITAL_COMMUNITY): Payer: Self-pay

## 2014-07-11 ENCOUNTER — Encounter (HOSPITAL_COMMUNITY)
Admission: RE | Disposition: A | Discharge status: Home / Self Care | Payer: Self-pay | Source: Ambulatory Visit | Attending: Ophthalmology

## 2014-07-11 DIAGNOSIS — H2512 Age-related nuclear cataract, left eye: Secondary | ICD-10-CM | POA: Insufficient documentation

## 2014-07-11 HISTORY — PX: CATARACT EXTRACTION W/PHACO: SHX586

## 2014-07-11 LAB — GLUCOSE, CAPILLARY: Glucose-Capillary: 111 mg/dL — ABNORMAL HIGH (ref 65–99)

## 2014-07-11 SURGERY — PHACOEMULSIFICATION, CATARACT, WITH IOL INSERTION
Anesthesia: Monitor Anesthesia Care | Laterality: Left

## 2014-07-11 MED ORDER — LIDOCAINE HCL 3.5 % OP GEL: 1.0000 "application " | Freq: Once | OPHTHALMIC | Status: DC

## 2014-07-11 MED ORDER — PHENYLEPHRINE-KETOROLAC 1-0.3 % IO SOLN
INTRAOCULAR | Status: AC
  Filled 2014-07-11: qty 4

## 2014-07-11 MED ORDER — NA HYALUR & NA CHOND-NA HYALUR 0.55-0.5 ML IO KIT
PACK | INTRAOCULAR | Status: DC | PRN
  Administered 2014-07-11: 1 via OPHTHALMIC

## 2014-07-11 MED ORDER — TETRACAINE HCL 0.5 % OP SOLN
1.0000 [drp] | OPHTHALMIC | Status: AC
  Administered 2014-07-11 (×3): 1 [drp] via OPHTHALMIC

## 2014-07-11 MED ORDER — TETRACAINE 0.5 % OP SOLN OPTIME - NO CHARGE
OPHTHALMIC | Status: DC | PRN
  Administered 2014-07-11: 2 [drp] via OPHTHALMIC

## 2014-07-11 MED ORDER — MIDAZOLAM HCL 2 MG/2ML IJ SOLN
1.0000 mg | INTRAMUSCULAR | Status: DC | PRN
  Administered 2014-07-11: 2 mg via INTRAVENOUS

## 2014-07-11 MED ORDER — FENTANYL CITRATE (PF) 100 MCG/2ML IJ SOLN
INTRAMUSCULAR | Status: AC
  Filled 2014-07-11: qty 2

## 2014-07-11 MED ORDER — POVIDONE-IODINE 5 % OP SOLN
OPHTHALMIC | Status: DC | PRN
  Administered 2014-07-11: 1 via OPHTHALMIC

## 2014-07-11 MED ORDER — CYCLOPENTOLATE-PHENYLEPHRINE 0.2-1 % OP SOLN
1.0000 [drp] | OPHTHALMIC | Status: AC
  Administered 2014-07-11 (×3): 1 [drp] via OPHTHALMIC

## 2014-07-11 MED ORDER — MIDAZOLAM HCL 2 MG/2ML IJ SOLN
INTRAMUSCULAR | Status: AC
  Filled 2014-07-11: qty 2

## 2014-07-11 MED ORDER — BSS IO SOLN
INTRAOCULAR | Status: DC | PRN
  Administered 2014-07-11: 15 mL

## 2014-07-11 MED ORDER — LIDOCAINE HCL 3.5 % OP GEL
OPHTHALMIC | Status: DC | PRN
  Administered 2014-07-11: 1 via OPHTHALMIC

## 2014-07-11 MED ORDER — PHENYLEPHRINE-KETOROLAC 1-0.3 % IO SOLN
INTRAOCULAR | Status: DC | PRN
  Administered 2014-07-11: 500 mL via OPHTHALMIC

## 2014-07-11 MED ORDER — FENTANYL CITRATE (PF) 100 MCG/2ML IJ SOLN
25.0000 ug | INTRAMUSCULAR | Status: AC
  Administered 2014-07-11 (×2): 25 ug via INTRAVENOUS

## 2014-07-11 MED ORDER — LACTATED RINGERS IV SOLN
INTRAVENOUS | Status: DC
  Administered 2014-07-11: 08:00:00 via INTRAVENOUS

## 2014-07-11 MED ORDER — SODIUM CHLORIDE 0.9 % IJ SOLN
INTRAMUSCULAR | Status: AC
  Filled 2014-07-11: qty 3

## 2014-07-11 SURGICAL SUPPLY — 7 items
CLOTH BEACON ORANGE TIMEOUT ST (SAFETY) ×2 IMPLANT
GLOVE BIOGEL PI IND STRL 7.0 (GLOVE) ×2 IMPLANT
GLOVE BIOGEL PI INDICATOR 7.0 (GLOVE) ×2
INST SET CATARACT ~~LOC~~ (KITS) ×2 IMPLANT
LENS ALC ACRYL/TECN (Ophthalmic Related) ×2 IMPLANT
PAD ARMBOARD 7.5X6 YLW CONV (MISCELLANEOUS) ×2 IMPLANT
WATER STERILE IRR 250ML POUR (IV SOLUTION) ×2 IMPLANT

## 2014-07-11 NOTE — Transfer of Care (Signed)
Immediate Anesthesia Transfer of Care Note  Patient: Xavier Caldwell  Procedure(s) Performed: Procedure(s): CATARACT EXTRACTION PHACO AND INTRAOCULAR LENS PLACEMENT LEFT EYE CDE=8.32 (Left)  Patient Location: Short Stay  Anesthesia Type:MAC  Level of Consciousness: awake, alert , oriented and patient cooperative  Airway & Oxygen Therapy: Patient Spontanous Breathing  Post-op Assessment: Report given to RN, Post -op Vital signs reviewed and stable and Patient moving all extremities  Post vital signs: Reviewed and stable  Last Vitals:  Filed Vitals:   07/11/14 0730  BP: 130/75  Pulse: 82  Temp: 37.4 C    Complications: No apparent anesthesia complications

## 2014-07-11 NOTE — Anesthesia Preprocedure Evaluation (Signed)
Anesthesia Evaluation  Patient identified by MRN, date of birth, ID band Patient awake    Reviewed: Allergy & Precautions, NPO status , Patient's Chart, lab work & pertinent test results  Airway Mallampati: II  TM Distance: >3 FB     Dental  (+) Teeth Intact   Pulmonary former smoker,  breath sounds clear to auscultation        Cardiovascular hypertension, Pt. on medications Rhythm:Regular Rate:Normal     Neuro/Psych    GI/Hepatic negative GI ROS,   Endo/Other  diabetes, Well Controlled, Type 2, Oral Hypoglycemic Agents  Renal/GU      Musculoskeletal   Abdominal   Peds  Hematology   Anesthesia Other Findings   Reproductive/Obstetrics                             Anesthesia Physical Anesthesia Plan  ASA: II  Anesthesia Plan: MAC   Post-op Pain Management:    Induction: Intravenous  Airway Management Planned: Nasal Cannula  Additional Equipment:   Intra-op Plan:   Post-operative Plan:   Informed Consent: I have reviewed the patients History and Physical, chart, labs and discussed the procedure including the risks, benefits and alternatives for the proposed anesthesia with the patient or authorized representative who has indicated his/her understanding and acceptance.     Plan Discussed with:   Anesthesia Plan Comments:         Anesthesia Quick Evaluation

## 2014-07-11 NOTE — Discharge Instructions (Signed)
Xavier Caldwell 07/11/2014 Dr. Iona Hansen Post operative Instructions for Cataract Patients  These instructions are for Xavier Caldwell and pertain to the operative eye.  1.  Resume your normal diet and previous oral medicines.  2. Your Follow-up appointment is at Dr. Iona Hansen' office in [place] on 07/12/14 at 10:45am  3. You may leave the hospital when your driver is present and your nurse releases you.  4. Begin Pred Forte (prednisolone acetate 1%), Acular LS (ketorolac tromethamine .4%) and Gatifloxacin 0.5% eye drops; 1 drop each 4 times daily to operative eye. Begin 3 hours after discharge from Short Stay Unit.  Moxifloxacin 0.5% may be substituted for Gatifloxacin using the same instructions.  48. Page Dr. Iona Hansen via beeper [phone] for significant pain in or around operative eye that is not relieved by Tylenol.  6. If you took Plavix before surgery, restart it at the usual dose on the evening of surgery.  7. Wear dark glasses as necessary for excessive light sensitivity.  8. Do no forcefully rub you your operative eye.  9. Keep your operative eye dry for 1 week. You may gently clean your eyelids with a damp washcloth.  10. You may resume normal occupational activities in one week and resume driving as tolerated after the first post operative visit.  11. It is normal to have blurred vision and a scratchy sensation following surgery.  Dr. Iona Hansen: [phone]  PATIENT INSTRUCTIONS POST-ANESTHESIA  IMMEDIATELY FOLLOWING SURGERY:  Do not drive or operate machinery for the first twenty four hours after surgery.  Do not make any important decisions for twenty four hours after surgery or while taking narcotic pain medications or sedatives.  If you develop intractable nausea and vomiting or a severe headache please notify your doctor immediately.  FOLLOW-UP:  Please make an appointment with your surgeon as instructed. You do not need to follow up with anesthesia unless specifically instructed to do  so.  WOUND CARE INSTRUCTIONS (if applicable):  Keep a dry clean dressing on the anesthesia/puncture wound site if there is drainage.  Once the wound has quit draining you may leave it open to air.  Generally you should leave the bandage intact for twenty four hours unless there is drainage.  If the epidural site drains for more than 36-48 hours please call the anesthesia department.  QUESTIONS?:  Please feel free to call your physician or the hospital operator if you have any questions, and they will be happy to assist you.

## 2014-07-11 NOTE — H&P (Signed)
I have reviewed the pre printed H&P, the patient was re-examined, and I have identified no significant interval changes in the patient's medical condition.  There is no change in the plan of care since the history and physical of record. 

## 2014-07-11 NOTE — Anesthesia Postprocedure Evaluation (Signed)
  Anesthesia Post-op Note  Patient: Xavier Caldwell  Procedure(s) Performed: Procedure(s): CATARACT EXTRACTION PHACO AND INTRAOCULAR LENS PLACEMENT LEFT EYE CDE=8.32 (Left)  Patient Location: Short Stay  Anesthesia Type:MAC  Level of Consciousness: awake, alert , oriented and patient cooperative  Airway and Oxygen Therapy: Patient Spontanous Breathing  Post-op Pain: none  Post-op Assessment: Post-op Vital signs reviewed, Patient's Cardiovascular Status Stable, Respiratory Function Stable, Patent Airway, No signs of Nausea or vomiting and Pain level controlled  Post-op Vital Signs: Reviewed and stable  Last Vitals:  Filed Vitals:   07/11/14 0730  BP: 130/75  Pulse: 82  Temp: 83.6 C    Complications: No apparent anesthesia complications

## 2014-07-11 NOTE — Op Note (Signed)
07/11/2014  9:04 AM  PATIENT:  Xavier Caldwell  64 y.o. male  PRE-OPERATIVE DIAGNOSIS:  nuclear cataract left eye, difficulty performing daily activities  POST-OPERATIVE DIAGNOSIS:  nuclear cataract left eye, difficulty performing daily activities  PROCEDURE:  Procedure(s): CATARACT EXTRACTION PHACO AND INTRAOCULAR LENS PLACEMENT LEFT EYE CDE=8.32  SURGEON:  Surgeon(s): Williams Che, MD  ASSISTANTS:  Bonney Roussel, CST   ANESTHESIA STAFF: Anesthesiologist: Lerry Liner, MD CRNA: Charmaine Downs, CRNA  ANESTHESIA:   topical and MAC  REQUESTED LENS POWER: 13.0  LENS IMPLANT INFORMATION:  AlconSn60WF  S/n [id number]    Exp 09/2016  CUMULATIVE DISSIPATED ENERGY:8.32  INDICATIONS:see office H&P indications  OP FINDINGS:mod dense NS  COMPLICATIONS:None  PROCEDURE: The was brought to the operating room in good condition and had general anesthesia administered, then prepped and draped in the usual fashion for intraocular surgery. A 360 degree conjunctival peritomy was performed with bishop harmon forceps and iris scissors. The cornea was excised with a #11 blade. The ocular contents were removed with an evisceration spoon. The ocular interior was carefully stripped with a frazier suction until free of all pigment. Hemostasis was obtained with gentile pressure from a sterile test tube inserted into the posterior pole. A 50VW silicone ball implant Was placed with an implant inserter. Good tissue overlap was noted. The sclera was winged and sutured with 5-0 white vicryl interrupted sutures. The conjunctiva was closed with a running 5-0 white vicryl suture. Bacitracin Oph ung was placed onto the suture line and a silicone conformer placed. A retrobulbar marcaine injection was performed to insure postoperative comfort. The patient was extubated and transferred to PACU for recovery.

## 2014-07-11 NOTE — Brief Op Note (Signed)
07/11/2014  9:04 AM  PATIENT:  Xavier Caldwell  64 y.o. male  PRE-OPERATIVE DIAGNOSIS:  nuclear cataract left eye, difficulty performing daily activities  POST-OPERATIVE DIAGNOSIS:  nuclear cataract left eye, difficulty performing daily activities  PROCEDURE:  Procedure(s): CATARACT EXTRACTION PHACO AND INTRAOCULAR LENS PLACEMENT LEFT EYE CDE=8.32  SURGEON:  Surgeon(s): Williams Che, MD  ASSISTANTS:  Bonney Roussel, CST   ANESTHESIA STAFF: Anesthesiologist: Lerry Liner, MD CRNA: Charmaine Downs, CRNA  ANESTHESIA:   topical and MAC  REQUESTED LENS POWER: 13.0  LENS IMPLANT INFORMATION:  AlconSn60WF  S/n [id number]    Exp 09/2016  CUMULATIVE DISSIPATED ENERGY:8.32  INDICATIONS:see office H&P indications  OP FINDINGS:mod dense NS  COMPLICATIONS:None  DICTATION #: none  PLAN OF CARE: as above   PATIENT DISPOSITION:  Short Stay

## 2014-07-12 ENCOUNTER — Encounter (HOSPITAL_COMMUNITY): Payer: Self-pay | Admitting: Ophthalmology

## 2014-12-12 ENCOUNTER — Other Ambulatory Visit: Payer: Self-pay | Admitting: "Endocrinology

## 2014-12-13 LAB — COMPREHENSIVE METABOLIC PANEL
A/G RATIO: 1.9 (ref 1.1–2.5)
ALK PHOS: 72 IU/L (ref 39–117)
ALT: 13 IU/L (ref 0–44)
AST: 15 IU/L (ref 0–40)
Albumin: 4.3 g/dL (ref 3.6–4.8)
BILIRUBIN TOTAL: 0.3 mg/dL (ref 0.0–1.2)
BUN/Creatinine Ratio: 12 (ref 10–22)
BUN: 10 mg/dL (ref 8–27)
CO2: 22 mmol/L (ref 18–29)
Calcium: 9.8 mg/dL (ref 8.6–10.2)
Chloride: 98 mmol/L (ref 97–106)
Creatinine, Ser: 0.83 mg/dL (ref 0.76–1.27)
GFR calc Af Amer: 109 mL/min/{1.73_m2} (ref >59)
GFR calc non Af Amer: 94 mL/min/{1.73_m2} (ref >59)
GLUCOSE: 129 mg/dL — AB (ref 65–99)
Globulin, Total: 2.3 g/dL (ref 1.5–4.5)
Potassium: 4.6 mmol/L (ref 3.5–5.2)
Sodium: 137 mmol/L (ref 136–144)
Total Protein: 6.6 g/dL (ref 6.0–8.5)

## 2014-12-13 LAB — HGB A1C W/O EAG: HEMOGLOBIN A1C: 7.9 % — AB (ref 4.8–5.6)

## 2014-12-19 ENCOUNTER — Encounter: Payer: Self-pay | Admitting: "Endocrinology

## 2014-12-19 ENCOUNTER — Ambulatory Visit (INDEPENDENT_AMBULATORY_CARE_PROVIDER_SITE_OTHER): Payer: PPO | Admitting: "Endocrinology

## 2014-12-19 VITALS — BP 145/77 | HR 87 | Ht 70.3 in | Wt 212.0 lb

## 2014-12-19 DIAGNOSIS — Z794 Long term (current) use of insulin: Secondary | ICD-10-CM | POA: Diagnosis not present

## 2014-12-19 DIAGNOSIS — E118 Type 2 diabetes mellitus with unspecified complications: Secondary | ICD-10-CM | POA: Diagnosis not present

## 2014-12-19 DIAGNOSIS — IMO0002 Reserved for concepts with insufficient information to code with codable children: Secondary | ICD-10-CM

## 2014-12-19 DIAGNOSIS — E1169 Type 2 diabetes mellitus with other specified complication: Secondary | ICD-10-CM | POA: Insufficient documentation

## 2014-12-19 DIAGNOSIS — I10 Essential (primary) hypertension: Secondary | ICD-10-CM

## 2014-12-19 DIAGNOSIS — E1165 Type 2 diabetes mellitus with hyperglycemia: Secondary | ICD-10-CM

## 2014-12-19 DIAGNOSIS — E785 Hyperlipidemia, unspecified: Secondary | ICD-10-CM

## 2014-12-19 NOTE — Patient Instructions (Signed)

## 2014-12-19 NOTE — Progress Notes (Signed)
Subjective:    Patient ID: Xavier Caldwell, male    DOB: 1951/01/24,    Past Medical History  Diagnosis Date  . Diabetes mellitus without complication ([place])   . High cholesterol   . Hypertension   . Neuropathy ([place])   . Back pain   . Sleep apnea     Stop Bang score of 4  . Anxiety   . History of gout   . Arthritis    Past Surgical History  Procedure Laterality Date  . Appendectomy    . Cataract extraction w/phaco Right 04/25/2014    Procedure: CATARACT EXTRACTION PHACO AND INTRAOCULAR LENS PLACEMENT (IOC);  Surgeon: Williams Che, MD;  Location: [facility];  Service: Ophthalmology;  Laterality: Right;  CDE:4.70  . Cataract extraction w/phaco Left 07/11/2014    Procedure: CATARACT EXTRACTION PHACO AND INTRAOCULAR LENS PLACEMENT LEFT EYE CDE=8.32;  Surgeon: Williams Che, MD;  Location: [facility];  Service: Ophthalmology;  Laterality: Left;   Social History   Social History  . Marital Status: Married    Spouse Name: N/A  . Number of Children: N/A  . Years of Education: N/A   Social History Main Topics  . Smoking status: Former Smoker -- 1.50 packs/day for 30 years    Types: Cigarettes    Quit date: 07/05/2012  . Smokeless tobacco: None  . Alcohol Use: No  . Drug Use: No  . Sexual Activity: No   Other Topics Concern  . None   Social History Narrative   Outpatient Encounter Prescriptions as of 12/19/2014  Medication Sig  . ALPRAZolam (XANAX) 0.5 MG tablet Take 0.5 mg by mouth 2 (two) times daily as needed for anxiety.  . canagliflozin (INVOKANA) 300 MG TABS tablet Take 300 mg by mouth daily before breakfast.  . DULoxetine (CYMBALTA) 60 MG capsule Take 60 mg by mouth daily.  Marland Kitchen HYDROcodone-acetaminophen (NORCO/VICODIN) 5-325 MG per tablet Take 1 tablet by mouth every 6 (six) hours as needed for moderate pain.  . Insulin Glargine (TOUJEO SOLOSTAR) 300 UNIT/ML SOPN Inject 20 Units into the skin.  Marland Kitchen lisinopril (PRINIVIL,ZESTRIL) 40 MG tablet Take 40 mg by mouth daily.  .  metFORMIN (GLUCOPHAGE) 1000 MG tablet Take 1,000 mg by mouth 2 (two) times daily with a meal.  . pregabalin (LYRICA) 75 MG capsule Take 75 mg by mouth daily. In the morning and 150 mg in the evening.  Marland Kitchen rOPINIRole (REQUIP) 1 MG tablet Take 1 mg by mouth 3 (three) times daily.  . simvastatin (ZOCOR) 20 MG tablet Take 20 mg by mouth daily.  Marland Kitchen aspirin EC 81 MG tablet Take 81 mg by mouth daily.  . predniSONE (DELTASONE) 10 MG tablet Take 2 tablets (20 mg total) by mouth daily. (Patient not taking: Reported on 01/19/2014)   No facility-administered encounter medications on file as of 12/19/2014.   ALLERGIES: No Known Allergies VACCINATION STATUS:  There is no immunization history on file for this patient.  Diabetes He presents for his follow-up diabetic visit. He has type 2 diabetes mellitus. Pertinent negatives for hypoglycemia include no confusion, headaches, pallor or seizures. Pertinent negatives for diabetes include no chest pain, no fatigue, no polydipsia, no polyphagia, no polyuria and no weakness.  Hyperlipidemia Pertinent negatives include no chest pain, myalgias or shortness of breath.  Hypertension Pertinent negatives include no chest pain, headaches, neck pain, palpitations or shortness of breath.     Review of Systems  Constitutional: Negative for fatigue and unexpected weight change.  HENT: Negative  for dental problem, mouth sores and trouble swallowing.   Eyes: Negative for visual disturbance.  Respiratory: Negative for cough, choking, chest tightness, shortness of breath and wheezing.   Cardiovascular: Negative for chest pain, palpitations and leg swelling.  Gastrointestinal: Negative for nausea, vomiting, abdominal pain, diarrhea, constipation and abdominal distention.  Endocrine: Negative for polydipsia, polyphagia and polyuria.  Genitourinary: Negative for dysuria, urgency, hematuria and flank pain.  Musculoskeletal: Negative for myalgias, back pain, gait problem and  neck pain.  Skin: Negative for pallor, rash and wound.  Neurological: Negative for seizures, syncope, weakness, numbness and headaches.  Psychiatric/Behavioral: Negative.  Negative for confusion and dysphoric mood.    Objective:    BP 145/77 mmHg  Pulse 87  Ht 5' 10.3" (1.786 m)  Wt 212 lb (96.163 kg)  BMI 30.15 kg/m2  SpO2 98%  Wt Readings from Last 3 Encounters:  12/19/14 212 lb (96.163 kg)  07/11/14 200 lb (90.719 kg)  07/06/14 200 lb (90.719 kg)    Physical Exam  Constitutional: He is oriented to person, place, and time. He appears well-developed and well-nourished. He is cooperative. No distress.  HENT:  Head: Normocephalic and atraumatic.  Eyes: EOM are normal.  Neck: Normal range of motion. Neck supple. No tracheal deviation present. No thyromegaly present.  Cardiovascular: Normal rate, S1 normal, S2 normal and normal heart sounds.  Exam reveals no gallop.   No murmur heard. Pulses:      Dorsalis pedis pulses are 1+ on the right side, and 1+ on the left side.       Posterior tibial pulses are 1+ on the right side, and 1+ on the left side.  Pulmonary/Chest: Breath sounds normal. No respiratory distress. He has no wheezes.  Abdominal: Soft. Bowel sounds are normal. He exhibits no distension. There is no tenderness. There is no guarding and no CVA tenderness.  Musculoskeletal: He exhibits no edema.       Right shoulder: He exhibits no swelling and no deformity.  Neurological: He is alert and oriented to person, place, and time. He has normal strength and normal reflexes. No cranial nerve deficit or sensory deficit. Gait normal.  Skin: Skin is warm and dry. No rash noted. No cyanosis. Nails show no clubbing.  Psychiatric: He has a normal mood and affect. His speech is normal and behavior is normal. Judgment and thought content normal. Cognition and memory are normal.    Results for orders placed or performed in visit on 12/12/14  Comprehensive metabolic panel  Result Value  Ref Range   Glucose 129 (H) 65 - 99 mg/dL   BUN 10 8 - 27 mg/dL   Creatinine, Ser 0.83 0.76 - 1.27 mg/dL   GFR calc non Af Amer 94 >59 mL/min/1.73   GFR calc Af Amer 109 >59 mL/min/1.73   BUN/Creatinine Ratio 12 10 - 22   Sodium 137 136 - 144 mmol/L   Potassium 4.6 3.5 - 5.2 mmol/L   Chloride 98 97 - 106 mmol/L   CO2 22 18 - 29 mmol/L   Calcium 9.8 8.6 - 10.2 mg/dL   Total Protein 6.6 6.0 - 8.5 g/dL   Albumin 4.3 3.6 - 4.8 g/dL   Globulin, Total 2.3 1.5 - 4.5 g/dL   Albumin/Globulin Ratio 1.9 1.1 - 2.5   Bilirubin Total 0.3 0.0 - 1.2 mg/dL   Alkaline Phosphatase 72 39 - 117 IU/L   AST 15 0 - 40 IU/L   ALT 13 0 - 44 IU/L  Hgb A1c w/o eAG  Result Value Ref Range   Hgb A1c MFr Bld 7.9 (H) 4.8 - 5.6 %   Complete Blood Count (Most recent): Lab Results  Component Value Date   WBC 13.9* 07/06/2014   HGB 14.5 07/06/2014   HCT 42.8 07/06/2014   MCV 89.4 07/06/2014   PLT 266 07/06/2014   Chemistry (most recent): Lab Results  Component Value Date   NA 137 12/12/2014   K 4.6 12/12/2014   CL 98 12/12/2014   CO2 22 12/12/2014   BUN 10 12/12/2014   CREATININE 0.83 12/12/2014   Diabetic Labs (most recent): Lab Results  Component Value Date   HGBA1C 7.9* 12/12/2014    Assessment & Plan:   1. Uncontrolled type 2 diabetes mellitus with complication, with long-term current use of insulin ([place])   - patient remains at a high risk for more acute and chronic complications of diabetes which include CAD, CVA, CKD, retinopathy, and neuropathy. These are all discussed in detail with the patient.  Patient came with above target glucose profile, and  recent A1c of 7.9 %.  Glucose logs and insulin administration records pertaining to this visit,  to be scanned into patient's records.  Recent labs reviewed.   - I have re-counseled the patient on diet management and weight loss  by adopting a carbohydrate restricted / protein rich  Diet.  - Suggestion is made for patient to avoid simple  carbohydrates   from their diet including Cakes , Desserts, Ice Cream,  Soda (  diet and regular) , Sweet Tea , Candies,  Chips, Cookies, Artificial Sweeteners,   and "Sugar-free" Products .  This will help patient to have stable blood glucose profile and potentially avoid unintended  Weight gain.  - Patient is advised to stick to a routine mealtimes to eat 3 meals  a day and avoid unnecessary snacks ( to snack only to correct hypoglycemia).  - The patient  has been  scheduled with Jearld Fenton, RDN, CDE for individualized DM education.  - I have approached patient with the following individualized plan to manage diabetes and patient agrees.  -He will continue on a basal insulin, increase  Toujeo to 20 units qhs, associated with monitoring of BG QAM. -I will continue Metformin 1000mg  po BID and Invokana 300mg  po qday.  - Patient will be considered for incretin therapy as appropriate next visit. - Patient specific target  for A1c; LDL, HDL, Triglycerides, and  Waist Circumference were discussed in detail.  2) BP/HTN: Controlled. Continue current medications including ACEI/ARB. 3) Lipids/HPL:  Controlled, continue statins. 4)  Weight/Diet: CDE consult in progress, exercise, and carbohydrates information provided.  5) Chronic Care/Health Maintenance:  -Patient is on ACEI/ARB and Statin medications and encouraged to continue to follow up with Ophthalmology, Podiatrist at least yearly or according to recommendations, and advised to  stay away from smoking. I have recommended yearly flu vaccine and pneumonia vaccination at least every 5 years; moderate intensity exercise for up to 150 minutes weekly; and  sleep for at least 7 hours a day.  I advised patient to maintain close follow up with their PCP for primary care needs.  Patient is asked to bring meter and  blood glucose logs during their next visit.   Follow up plan: Return in about 3 months (around 03/21/2015) for diabetes, high blood  pressure, high cholesterol.  Glade Lloyd, MD Phone: [phone]  Fax: [phone]   12/19/2014, 11:40 AM

## 2015-01-09 ENCOUNTER — Other Ambulatory Visit: Payer: Self-pay | Admitting: "Endocrinology

## 2015-01-26 ENCOUNTER — Other Ambulatory Visit: Payer: Self-pay

## 2015-01-26 ENCOUNTER — Telehealth: Payer: Self-pay

## 2015-01-26 ENCOUNTER — Other Ambulatory Visit: Payer: Self-pay | Admitting: "Endocrinology

## 2015-01-26 MED ORDER — CANAGLIFLOZIN 300 MG PO TABS: 300.0000 mg | ORAL_TABLET | Freq: Every day | ORAL | Status: DC

## 2015-01-26 MED ORDER — EMPAGLIFLOZIN 25 MG PO TABS: 25.0000 mg | ORAL_TABLET | Freq: Every day | ORAL | Status: DC

## 2015-01-26 NOTE — Telephone Encounter (Signed)
Pt states he can no longer afford the Invokana. The copay is now $175.  He is requesting something cheaper

## 2015-02-02 ENCOUNTER — Other Ambulatory Visit: Payer: Self-pay | Admitting: "Endocrinology

## 2015-02-02 NOTE — Telephone Encounter (Signed)
Pt notified by voicemail.

## 2015-02-02 NOTE — Telephone Encounter (Signed)
I will send a prescription for Jardiance 25 mg by mouth daily to his pharmacy, however he will need to be a coupon from clinic.

## 2015-02-20 LAB — HEMOGLOBIN A1C: Hemoglobin A1C: 7.7

## 2015-03-14 ENCOUNTER — Other Ambulatory Visit: Payer: Self-pay

## 2015-03-14 MED ORDER — INSULIN PEN NEEDLE 31G X 4 MM MISC: 1.0000 | Freq: Every day | Status: DC

## 2015-03-23 ENCOUNTER — Ambulatory Visit (INDEPENDENT_AMBULATORY_CARE_PROVIDER_SITE_OTHER): Payer: PPO | Admitting: "Endocrinology

## 2015-03-23 ENCOUNTER — Encounter: Payer: Self-pay | Admitting: "Endocrinology

## 2015-03-23 VITALS — BP 128/78 | HR 85 | Ht 70.3 in | Wt 210.0 lb

## 2015-03-23 DIAGNOSIS — E1165 Type 2 diabetes mellitus with hyperglycemia: Secondary | ICD-10-CM | POA: Diagnosis not present

## 2015-03-23 DIAGNOSIS — I10 Essential (primary) hypertension: Secondary | ICD-10-CM

## 2015-03-23 DIAGNOSIS — Z794 Long term (current) use of insulin: Secondary | ICD-10-CM | POA: Diagnosis not present

## 2015-03-23 DIAGNOSIS — E785 Hyperlipidemia, unspecified: Secondary | ICD-10-CM

## 2015-03-23 DIAGNOSIS — IMO0002 Reserved for concepts with insufficient information to code with codable children: Secondary | ICD-10-CM

## 2015-03-23 DIAGNOSIS — E118 Type 2 diabetes mellitus with unspecified complications: Secondary | ICD-10-CM | POA: Diagnosis not present

## 2015-03-23 NOTE — Progress Notes (Signed)
Subjective:    Patient ID: Xavier Caldwell, male    DOB: 1950-05-26,    Past Medical History  Diagnosis Date  . Diabetes mellitus without complication ([place])   . High cholesterol   . Hypertension   . Neuropathy ([place])   . Back pain   . Sleep apnea     Stop Bang score of 4  . Anxiety   . History of gout   . Arthritis    Past Surgical History  Procedure Laterality Date  . Appendectomy    . Cataract extraction w/phaco Right 04/25/2014    Procedure: CATARACT EXTRACTION PHACO AND INTRAOCULAR LENS PLACEMENT (IOC);  Surgeon: Williams Che, MD;  Location: [facility];  Service: Ophthalmology;  Laterality: Right;  CDE:4.70  . Cataract extraction w/phaco Left 07/11/2014    Procedure: CATARACT EXTRACTION PHACO AND INTRAOCULAR LENS PLACEMENT LEFT EYE CDE=8.32;  Surgeon: Williams Che, MD;  Location: [facility];  Service: Ophthalmology;  Laterality: Left;  . Retinal detachment surgery     Social History   Social History  . Marital Status: Married    Spouse Name: N/A  . Number of Children: N/A  . Years of Education: N/A   Social History Main Topics  . Smoking status: Former Smoker -- 1.50 packs/day for 30 years    Types: Cigarettes    Quit date: 07/05/2012  . Smokeless tobacco: None  . Alcohol Use: No  . Drug Use: No  . Sexual Activity: No   Other Topics Concern  . None   Social History Narrative   Outpatient Encounter Prescriptions as of 03/23/2015  Medication Sig  . ALPRAZolam (XANAX) 0.5 MG tablet Take 0.5 mg by mouth 2 (two) times daily as needed for anxiety.  Marland Kitchen aspirin EC 81 MG tablet Take 81 mg by mouth daily.  . DULoxetine (CYMBALTA) 60 MG capsule Take 60 mg by mouth daily.  . empagliflozin (JARDIANCE) 25 MG TABS tablet Take 25 mg by mouth daily.  Marland Kitchen HYDROcodone-acetaminophen (NORCO/VICODIN) 5-325 MG per tablet Take 1 tablet by mouth every 6 (six) hours as needed for moderate pain.  . Insulin Glargine (TOUJEO SOLOSTAR) 300 UNIT/ML SOPN Inject 20 Units into the skin.  .  Insulin Pen Needle 31G X 4 MM MISC 1 each by Does not apply route at bedtime.  Marland Kitchen lisinopril (PRINIVIL,ZESTRIL) 40 MG tablet Take 40 mg by mouth daily.  . metFORMIN (GLUCOPHAGE) 1000 MG tablet TAKE ONE TABLET BY MOUTH TWICE DAILY  . predniSONE (DELTASONE) 10 MG tablet Take 2 tablets (20 mg total) by mouth daily. (Patient not taking: Reported on 01/19/2014)  . pregabalin (LYRICA) 75 MG capsule Take 75 mg by mouth daily. In the morning and 150 mg in the evening.  Marland Kitchen rOPINIRole (REQUIP) 1 MG tablet Take 1 mg by mouth 3 (three) times daily.  . simvastatin (ZOCOR) 20 MG tablet Take 20 mg by mouth daily.   No facility-administered encounter medications on file as of 03/23/2015.   ALLERGIES: No Known Allergies VACCINATION STATUS:  There is no immunization history on file for this patient.  Diabetes He presents for his follow-up diabetic visit. He has type 2 diabetes mellitus. His disease course has been improving. Pertinent negatives for hypoglycemia include no confusion, headaches, pallor or seizures. Pertinent negatives for diabetes include no chest pain, no fatigue, no polydipsia, no polyphagia, no polyuria and no weakness. Symptoms are improving. Risk factors for coronary artery disease include diabetes mellitus, hypertension and tobacco exposure. Current diabetic treatment includes insulin injections and  oral agent (monotherapy). He is compliant with treatment most of the time. His weight is increasing steadily. He is following a generally unhealthy diet. When asked about meal planning, he reported none. He has had a previous visit with a dietitian. He never participates in exercise. His breakfast blood glucose range is generally 140-180 mg/dl. An ACE inhibitor/angiotensin II receptor blocker is being taken.  Hyperlipidemia Pertinent negatives include no chest pain, myalgias or shortness of breath.  Hypertension Pertinent negatives include no chest pain, headaches, neck pain, palpitations or  shortness of breath.     Review of Systems  Constitutional: Negative for fatigue and unexpected weight change.  HENT: Negative for dental problem, mouth sores and trouble swallowing.   Eyes: Negative for visual disturbance.  Respiratory: Negative for cough, choking, chest tightness, shortness of breath and wheezing.   Cardiovascular: Negative for chest pain, palpitations and leg swelling.  Gastrointestinal: Negative for nausea, vomiting, abdominal pain, diarrhea, constipation and abdominal distention.  Endocrine: Negative for polydipsia, polyphagia and polyuria.  Genitourinary: Negative for dysuria, urgency, hematuria and flank pain.  Musculoskeletal: Negative for myalgias, back pain, gait problem and neck pain.  Skin: Negative for pallor, rash and wound.  Neurological: Negative for seizures, syncope, weakness, numbness and headaches.  Psychiatric/Behavioral: Negative.  Negative for confusion and dysphoric mood.    Objective:    BP 128/78 mmHg  Pulse 85  Ht 5' 10.3" (1.786 m)  Wt 210 lb (95.255 kg)  BMI 29.86 kg/m2  SpO2 100%  Wt Readings from Last 3 Encounters:  03/23/15 210 lb (95.255 kg)  12/19/14 212 lb (96.163 kg)  07/11/14 200 lb (90.719 kg)    Physical Exam  Constitutional: He is oriented to person, place, and time. He appears well-developed and well-nourished. He is cooperative. No distress.  HENT:  Head: Normocephalic and atraumatic.  Eyes: EOM are normal.  Neck: Normal range of motion. Neck supple. No tracheal deviation present. No thyromegaly present.  Cardiovascular: Normal rate, S1 normal, S2 normal and normal heart sounds.  Exam reveals no gallop.   No murmur heard. Pulses:      Dorsalis pedis pulses are 1+ on the right side, and 1+ on the left side.       Posterior tibial pulses are 1+ on the right side, and 1+ on the left side.  Pulmonary/Chest: Breath sounds normal. No respiratory distress. He has no wheezes.  Abdominal: Soft. Bowel sounds are normal. He  exhibits no distension. There is no tenderness. There is no guarding and no CVA tenderness.  Musculoskeletal: He exhibits no edema.       Right shoulder: He exhibits no swelling and no deformity.  Neurological: He is alert and oriented to person, place, and time. He has normal strength and normal reflexes. No cranial nerve deficit or sensory deficit. Gait normal.  Skin: Skin is warm and dry. No rash noted. No cyanosis. Nails show no clubbing.  Psychiatric: He has a normal mood and affect. His speech is normal and behavior is normal. Judgment and thought content normal. Cognition and memory are normal.    Results for orders placed or performed in visit on 03/23/15  Hemoglobin A1c  Result Value Ref Range   Hemoglobin A1C 7.7    Complete Blood Count (Most recent): Lab Results  Component Value Date   WBC 13.9* 07/06/2014   HGB 14.5 07/06/2014   HCT 42.8 07/06/2014   MCV 89.4 07/06/2014   PLT 266 07/06/2014   Chemistry (most recent): Lab Results  Component Value Date  NA 137 12/12/2014   K 4.6 12/12/2014   CL 98 12/12/2014   CO2 22 12/12/2014   BUN 10 12/12/2014   CREATININE 0.83 12/12/2014   Diabetic Labs (most recent): Lab Results  Component Value Date   HGBA1C 7.7 02/20/2015   HGBA1C 7.9* 12/12/2014    Assessment & Plan:   1. Uncontrolled type 2 diabetes mellitus with complication, with long-term current use of insulin ([place])   - patient remains at a high risk for more acute and chronic complications of diabetes which include CAD, CVA, CKD, retinopathy, and neuropathy. These are all discussed in detail with the patient.  Patient came with above target glucose profile, and  recent A1c  7.7% stable from 7.9 %.  Glucose logs and insulin administration records pertaining to this visit,  to be scanned into patient's records.  Recent labs reviewed.   - I have re-counseled the patient on diet management and weight loss  by adopting a carbohydrate restricted / protein rich   Diet.  - Suggestion is made for patient to avoid simple carbohydrates   from their diet including Cakes , Desserts, Ice Cream,  Soda (  diet and regular) , Sweet Tea , Candies,  Chips, Cookies, Artificial Sweeteners,   and "Sugar-free" Products .  This will help patient to have stable blood glucose profile and potentially avoid unintended  Weight gain.  - Patient is advised to stick to a routine mealtimes to eat 3 meals  a day and avoid unnecessary snacks ( to snack only to correct hypoglycemia).  - The patient  has been  scheduled with Jearld Fenton, RDN, CDE for individualized DM education.  - I have approached patient with the following individualized plan to manage diabetes and patient agrees.  -He will continue on a basal insulin, Toujeo  20 units qhs, associated with monitoring of BG QAM. -I will continue Metformin 1000mg  po BID and Jardiance 10 mg by mouth daily. - Patient will be considered for incretin therapy as appropriate next visit. - Patient specific target  for A1c; LDL, HDL, Triglycerides, and  Waist Circumference were discussed in detail.  2) BP/HTN: Controlled. Continue current medications including ACEI/ARB. 3) Lipids/HPL:  Controlled, continue statins. 4)  Weight/Diet: CDE consult in progress, exercise, and carbohydrates information provided.  5) Chronic Care/Health Maintenance:  -Patient is on ACEI/ARB and Statin medications and encouraged to continue to follow up with Ophthalmology, Podiatrist at least yearly or according to recommendations, and advised to  stay away from smoking. I have recommended yearly flu vaccine and pneumonia vaccination at least every 5 years; moderate intensity exercise for up to 150 minutes weekly; and  sleep for at least 7 hours a day.  I advised patient to maintain close follow up with their PCP for primary care needs.  Patient is asked to bring meter and  blood glucose logs during their next visit.   Follow up plan: Return in about 3  months (around 06/20/2015) for diabetes, high blood pressure, high cholesterol, follow up with pre-visit labs, meter, and logs.  Glade Lloyd, MD Phone: [phone]  Fax: [phone]   03/23/2015, 10:52 AM

## 2015-03-23 NOTE — Patient Instructions (Signed)

## 2015-04-10 ENCOUNTER — Other Ambulatory Visit: Payer: Self-pay | Admitting: "Endocrinology

## 2015-05-08 ENCOUNTER — Telehealth: Payer: Self-pay

## 2015-05-08 ENCOUNTER — Other Ambulatory Visit: Payer: Self-pay | Admitting: "Endocrinology

## 2015-05-08 NOTE — Telephone Encounter (Signed)
We can try Jardiance prescription to see if it is covered better.

## 2015-05-08 NOTE — Telephone Encounter (Signed)
Pts Invokana is now costing him $179. He is in the "dougnut hole" he is requesting a new prescription for a cheaper medication.

## 2015-05-11 NOTE — Telephone Encounter (Signed)
Pt aware.

## 2015-06-12 ENCOUNTER — Other Ambulatory Visit: Payer: Self-pay | Admitting: "Endocrinology

## 2015-06-13 LAB — BASIC METABOLIC PANEL
BUN/Creatinine Ratio: 13 (ref 10–24)
BUN: 9 mg/dL (ref 8–27)
CALCIUM: 9.4 mg/dL (ref 8.6–10.2)
CHLORIDE: 99 mmol/L (ref 96–106)
CO2: 24 mmol/L (ref 18–29)
Creatinine, Ser: 0.67 mg/dL — ABNORMAL LOW (ref 0.76–1.27)
GFR calc Af Amer: 119 mL/min/{1.73_m2} (ref >59)
GFR, EST NON AFRICAN AMERICAN: 103 mL/min/{1.73_m2} (ref >59)
GLUCOSE: 115 mg/dL — AB (ref 65–99)
POTASSIUM: 4.5 mmol/L (ref 3.5–5.2)
SODIUM: 138 mmol/L (ref 134–144)

## 2015-06-13 LAB — HGB A1C W/O EAG: Hgb A1c MFr Bld: 8.2 % — ABNORMAL HIGH (ref 4.8–5.6)

## 2015-06-21 ENCOUNTER — Ambulatory Visit (INDEPENDENT_AMBULATORY_CARE_PROVIDER_SITE_OTHER): Payer: PPO | Admitting: "Endocrinology

## 2015-06-21 ENCOUNTER — Encounter: Payer: Self-pay | Admitting: "Endocrinology

## 2015-06-21 VITALS — BP 110/67 | HR 96 | Ht 70.3 in | Wt 212.0 lb

## 2015-06-21 DIAGNOSIS — I10 Essential (primary) hypertension: Secondary | ICD-10-CM | POA: Diagnosis not present

## 2015-06-21 DIAGNOSIS — E785 Hyperlipidemia, unspecified: Secondary | ICD-10-CM | POA: Diagnosis not present

## 2015-06-21 DIAGNOSIS — IMO0002 Reserved for concepts with insufficient information to code with codable children: Secondary | ICD-10-CM

## 2015-06-21 DIAGNOSIS — E1165 Type 2 diabetes mellitus with hyperglycemia: Secondary | ICD-10-CM

## 2015-06-21 DIAGNOSIS — E118 Type 2 diabetes mellitus with unspecified complications: Secondary | ICD-10-CM | POA: Diagnosis not present

## 2015-06-21 DIAGNOSIS — Z794 Long term (current) use of insulin: Secondary | ICD-10-CM

## 2015-06-21 NOTE — Progress Notes (Signed)
Subjective:    Patient ID: Xavier Caldwell, male    DOB: Jun 22, 1950,    Past Medical History  Diagnosis Date  . Diabetes mellitus without complication ([place])   . High cholesterol   . Hypertension   . Neuropathy ([place])   . Back pain   . Sleep apnea     Stop Bang score of 4  . Anxiety   . History of gout   . Arthritis    Past Surgical History  Procedure Laterality Date  . Appendectomy    . Cataract extraction w/phaco Right 04/25/2014    Procedure: CATARACT EXTRACTION PHACO AND INTRAOCULAR LENS PLACEMENT (IOC);  Surgeon: Williams Che, MD;  Location: [facility];  Service: Ophthalmology;  Laterality: Right;  CDE:4.70  . Cataract extraction w/phaco Left 07/11/2014    Procedure: CATARACT EXTRACTION PHACO AND INTRAOCULAR LENS PLACEMENT LEFT EYE CDE=8.32;  Surgeon: Williams Che, MD;  Location: [facility];  Service: Ophthalmology;  Laterality: Left;  . Retinal detachment surgery     Social History   Social History  . Marital Status: Married    Spouse Name: N/A  . Number of Children: N/A  . Years of Education: N/A   Social History Main Topics  . Smoking status: Former Smoker -- 1.50 packs/day for 30 years    Types: Cigarettes    Quit date: 07/05/2012  . Smokeless tobacco: None  . Alcohol Use: No  . Drug Use: No  . Sexual Activity: No   Other Topics Concern  . None   Social History Narrative   Outpatient Encounter Prescriptions as of 06/21/2015  Medication Sig  . ALPRAZolam (XANAX) 0.5 MG tablet Take 0.5 mg by mouth 2 (two) times daily as needed for anxiety.  Marland Kitchen aspirin EC 81 MG tablet Take 81 mg by mouth daily.  . DULoxetine (CYMBALTA) 60 MG capsule Take 60 mg by mouth daily.  . empagliflozin (JARDIANCE) 25 MG TABS tablet Take 25 mg by mouth daily.  Marland Kitchen HYDROcodone-acetaminophen (NORCO/VICODIN) 5-325 MG per tablet Take 1 tablet by mouth every 6 (six) hours as needed for moderate pain.  . Insulin Glargine (TOUJEO SOLOSTAR) 300 UNIT/ML SOPN Inject 20 Units into the skin.  .  Insulin Pen Needle 31G X 4 MM MISC 1 each by Does not apply route at bedtime.  Marland Kitchen lisinopril (PRINIVIL,ZESTRIL) 40 MG tablet Take 40 mg by mouth daily.  . metFORMIN (GLUCOPHAGE) 1000 MG tablet TAKE ONE TABLET BY MOUTH TWICE DAILY  . pregabalin (LYRICA) 75 MG capsule Take 75 mg by mouth daily. In the morning and 150 mg in the evening.  Marland Kitchen rOPINIRole (REQUIP) 1 MG tablet Take 1 mg by mouth 3 (three) times daily.  . simvastatin (ZOCOR) 20 MG tablet Take 20 mg by mouth daily.  . [DISCONTINUED] predniSONE (DELTASONE) 10 MG tablet Take 2 tablets (20 mg total) by mouth daily. (Patient not taking: Reported on 01/19/2014)   No facility-administered encounter medications on file as of 06/21/2015.   ALLERGIES: No Known Allergies VACCINATION STATUS:  There is no immunization history on file for this patient.  Diabetes He presents for his follow-up diabetic visit. He has type 2 diabetes mellitus. His disease course has been improving. Pertinent negatives for hypoglycemia include no confusion, headaches, pallor or seizures. Pertinent negatives for diabetes include no chest pain, no fatigue, no polydipsia, no polyphagia, no polyuria and no weakness. Symptoms are improving. Risk factors for coronary artery disease include diabetes mellitus, hypertension and tobacco exposure. Current diabetic treatment includes insulin injections  and oral agent (monotherapy). He is compliant with treatment most of the time. His weight is increasing steadily. He is following a generally unhealthy diet. When asked about meal planning, he reported none. He has had a previous visit with a dietitian. He never participates in exercise. His breakfast blood glucose range is generally 130-140 mg/dl. An ACE inhibitor/angiotensin II receptor blocker is being taken.  Hyperlipidemia Pertinent negatives include no chest pain, myalgias or shortness of breath.  Hypertension Pertinent negatives include no chest pain, headaches, neck pain,  palpitations or shortness of breath.     Review of Systems  Constitutional: Negative for fatigue and unexpected weight change.  HENT: Negative for dental problem, mouth sores and trouble swallowing.   Eyes: Negative for visual disturbance.  Respiratory: Negative for cough, choking, chest tightness, shortness of breath and wheezing.   Cardiovascular: Negative for chest pain, palpitations and leg swelling.  Gastrointestinal: Negative for nausea, vomiting, abdominal pain, diarrhea, constipation and abdominal distention.  Endocrine: Negative for polydipsia, polyphagia and polyuria.  Genitourinary: Negative for dysuria, urgency, hematuria and flank pain.  Musculoskeletal: Negative for myalgias, back pain, gait problem and neck pain.  Skin: Negative for pallor, rash and wound.  Neurological: Negative for seizures, syncope, weakness, numbness and headaches.  Psychiatric/Behavioral: Negative.  Negative for confusion and dysphoric mood.    Objective:    BP 110/67 mmHg  Pulse 102  Ht 5' 10.3" (1.786 m)  Wt 212 lb (96.163 kg)  BMI 30.15 kg/m2  SpO2 96%  Wt Readings from Last 3 Encounters:  06/21/15 212 lb (96.163 kg)  03/23/15 210 lb (95.255 kg)  12/19/14 212 lb (96.163 kg)    Physical Exam  Constitutional: He is oriented to person, place, and time. He appears well-developed and well-nourished. He is cooperative. No distress.  HENT:  Head: Normocephalic and atraumatic.  Eyes: EOM are normal.  Neck: Normal range of motion. Neck supple. No tracheal deviation present. No thyromegaly present.  Cardiovascular: Normal rate, S1 normal, S2 normal and normal heart sounds.  Exam reveals no gallop.   No murmur heard. Pulses:      Dorsalis pedis pulses are 1+ on the right side, and 1+ on the left side.       Posterior tibial pulses are 1+ on the right side, and 1+ on the left side.  Pulmonary/Chest: Breath sounds normal. No respiratory distress. He has no wheezes.  Abdominal: Soft. Bowel sounds  are normal. He exhibits no distension. There is no tenderness. There is no guarding and no CVA tenderness.  Musculoskeletal: He exhibits no edema.       Right shoulder: He exhibits no swelling and no deformity.  Neurological: He is alert and oriented to person, place, and time. He has normal strength and normal reflexes. No cranial nerve deficit or sensory deficit. Gait normal.  Skin: Skin is warm and dry. No rash noted. No cyanosis. Nails show no clubbing.  Psychiatric: He has a normal mood and affect. His speech is normal and behavior is normal. Judgment and thought content normal. Cognition and memory are normal.    Results for orders placed or performed in visit on [id number]  Basic metabolic panel  Result Value Ref Range   Glucose 115 (H) 65 - 99 mg/dL   BUN 9 8 - 27 mg/dL   Creatinine, Ser 0.67 (L) 0.76 - 1.27 mg/dL   GFR calc non Af Amer 103 >59 mL/min/1.73   GFR calc Af Amer 119 >59 mL/min/1.73   BUN/Creatinine Ratio 13 10 -  24   Sodium 138 134 - 144 mmol/L   Potassium 4.5 3.5 - 5.2 mmol/L   Chloride 99 96 - 106 mmol/L   CO2 24 18 - 29 mmol/L   Calcium 9.4 8.6 - 10.2 mg/dL  Hgb A1c w/o eAG  Result Value Ref Range   Hgb A1c MFr Bld 8.2 (H) 4.8 - 5.6 %   Diabetic Labs (most recent): Lab Results  Component Value Date   HGBA1C 8.2* 06/12/2015   HGBA1C 7.7 02/20/2015   HGBA1C 7.9* 12/12/2014    Assessment & Plan:   1. Uncontrolled type 2 diabetes mellitus with complication, with long-term current use of insulin ([place])   - patient remains at a high risk for more acute and chronic complications of diabetes which include CAD, CVA, CKD, retinopathy, and neuropathy. These are all discussed in detail with the patient.  Patient came with above target glucose profile, and  recent A1c  8.2%  Still recovering from his recent high dose steroid exposure.   Glucose logs and insulin administration records pertaining to this visit,  to be scanned into patient's records.  Recent labs  reviewed.   - I have re-counseled the patient on diet management and weight loss  by adopting a carbohydrate restricted / protein rich  Diet.  - Suggestion is made for patient to avoid simple carbohydrates   from their diet including Cakes , Desserts, Ice Cream,  Soda (  diet and regular) , Sweet Tea , Candies,  Chips, Cookies, Artificial Sweeteners,   and "Sugar-free" Products .  This will help patient to have stable blood glucose profile and potentially avoid unintended  Weight gain.  - Patient is advised to stick to a routine mealtimes to eat 3 meals  a day and avoid unnecessary snacks ( to snack only to correct hypoglycemia).  - The patient  has been  scheduled with Jearld Fenton, RDN, CDE for individualized DM education.  - I have approached patient with the following individualized plan to manage diabetes and patient agrees.  -He will continue on a basal insulin, Toujeo  20 units qhs, associated with monitoring of BG QAM. -I will continue Metformin 1000mg  po BID and Jardiance 25 mg by mouth daily. - Patient will be considered for incretin therapy as appropriate next visit. - Patient specific target  for A1c; LDL, HDL, Triglycerides, and  Waist Circumference were discussed in detail.  2) BP/HTN: Controlled. Continue current medications including ACEI/ARB. 3) Lipids/HPL:  Controlled, continue statins. 4)  Weight/Diet: CDE consult in progress, exercise, and carbohydrates information provided.  5) Chronic Care/Health Maintenance:  -Patient is on ACEI/ARB and Statin medications and encouraged to continue to follow up with Ophthalmology, Podiatrist at least yearly or according to recommendations, and advised to  stay away from smoking. I have recommended yearly flu vaccine and pneumonia vaccination at least every 5 years; moderate intensity exercise for up to 150 minutes weekly; and  sleep for at least 7 hours a day.  I advised patient to maintain close follow up with their PCP for primary  care needs.  Patient is asked to bring meter and  blood glucose logs during their next visit.   Follow up plan: Return in about 3 months (around 09/21/2015) for diabetes, high blood pressure, high cholesterol, follow up with pre-visit labs, meter, and logs.  Glade Lloyd, MD Phone: [phone]  Fax: [phone]   06/21/2015, 10:31 AM

## 2015-06-21 NOTE — Patient Instructions (Signed)

## 2015-07-10 ENCOUNTER — Other Ambulatory Visit: Payer: Self-pay | Admitting: "Endocrinology

## 2015-07-10 ENCOUNTER — Other Ambulatory Visit: Payer: Self-pay

## 2015-07-10 MED ORDER — EMPAGLIFLOZIN 25 MG PO TABS: 25.0000 mg | ORAL_TABLET | Freq: Every day | ORAL | Status: DC

## 2015-09-28 ENCOUNTER — Other Ambulatory Visit: Payer: Self-pay | Admitting: "Endocrinology

## 2015-09-29 LAB — COMPREHENSIVE METABOLIC PANEL WITH GFR
ALT: 16 IU/L (ref 0–44)
AST: 15 IU/L (ref 0–40)
Albumin/Globulin Ratio: 2.2 (ref 1.2–2.2)
Albumin: 4.4 g/dL (ref 3.6–4.8)
Alkaline Phosphatase: 73 IU/L (ref 39–117)
BUN/Creatinine Ratio: 18 (ref 10–24)
BUN: 13 mg/dL (ref 8–27)
Bilirubin Total: 0.2 mg/dL (ref 0.0–1.2)
CO2: 24 mmol/L (ref 18–29)
Calcium: 9.1 mg/dL (ref 8.6–10.2)
Chloride: 103 mmol/L (ref 96–106)
Creatinine, Ser: 0.71 mg/dL — ABNORMAL LOW (ref 0.76–1.27)
GFR calc Af Amer: 115 mL/min/1.73
GFR calc non Af Amer: 100 mL/min/1.73
Globulin, Total: 2 g/dL (ref 1.5–4.5)
Glucose: 133 mg/dL — ABNORMAL HIGH (ref 65–99)
Potassium: 4.8 mmol/L (ref 3.5–5.2)
Sodium: 141 mmol/L (ref 134–144)
Total Protein: 6.4 g/dL (ref 6.0–8.5)

## 2015-09-29 LAB — HGB A1C W/O EAG: Hgb A1c MFr Bld: 7.6 % — ABNORMAL HIGH (ref 4.8–5.6)

## 2015-10-06 ENCOUNTER — Encounter: Payer: Self-pay | Admitting: "Endocrinology

## 2015-10-06 ENCOUNTER — Ambulatory Visit (INDEPENDENT_AMBULATORY_CARE_PROVIDER_SITE_OTHER): Payer: PPO | Admitting: "Endocrinology

## 2015-10-06 VITALS — BP 108/65 | HR 74 | Ht 70.3 in | Wt 210.0 lb

## 2015-10-06 DIAGNOSIS — I10 Essential (primary) hypertension: Secondary | ICD-10-CM

## 2015-10-06 DIAGNOSIS — E118 Type 2 diabetes mellitus with unspecified complications: Secondary | ICD-10-CM

## 2015-10-06 DIAGNOSIS — E785 Hyperlipidemia, unspecified: Secondary | ICD-10-CM

## 2015-10-06 DIAGNOSIS — Z794 Long term (current) use of insulin: Secondary | ICD-10-CM | POA: Diagnosis not present

## 2015-10-06 DIAGNOSIS — IMO0002 Reserved for concepts with insufficient information to code with codable children: Secondary | ICD-10-CM

## 2015-10-06 DIAGNOSIS — E1165 Type 2 diabetes mellitus with hyperglycemia: Secondary | ICD-10-CM

## 2015-10-06 NOTE — Patient Instructions (Signed)

## 2015-10-06 NOTE — Progress Notes (Signed)
Subjective:    Patient ID: Xavier Caldwell, male    DOB: 1950/09/09,    Past Medical History:  Diagnosis Date  . Anxiety   . Arthritis   . Back pain   . Diabetes mellitus without complication ([place])   . High cholesterol   . History of gout   . Hypertension   . Neuropathy ([place])   . Sleep apnea    Stop Bang score of 4   Past Surgical History:  Procedure Laterality Date  . APPENDECTOMY    . CATARACT EXTRACTION W/PHACO Right 04/25/2014   Procedure: CATARACT EXTRACTION PHACO AND INTRAOCULAR LENS PLACEMENT (IOC);  Surgeon: Williams Che, MD;  Location: [facility];  Service: Ophthalmology;  Laterality: Right;  CDE:4.70  . CATARACT EXTRACTION W/PHACO Left 07/11/2014   Procedure: CATARACT EXTRACTION PHACO AND INTRAOCULAR LENS PLACEMENT LEFT EYE CDE=8.32;  Surgeon: Williams Che, MD;  Location: [facility];  Service: Ophthalmology;  Laterality: Left;  . RETINAL DETACHMENT SURGERY     Social History   Social History  . Marital status: Married    Spouse name: N/A  . Number of children: N/A  . Years of education: N/A   Social History Main Topics  . Smoking status: Former Smoker    Packs/day: 1.50    Years: 30.00    Types: Cigarettes    Quit date: 07/05/2012  . Smokeless tobacco: Never Used  . Alcohol use No  . Drug use: No  . Sexual activity: No   Other Topics Concern  . None   Social History Narrative  . None   Outpatient Encounter Prescriptions as of 10/06/2015  Medication Sig  . canagliflozin (INVOKANA) 100 MG TABS tablet Take by mouth daily before breakfast.  . ALPRAZolam (XANAX) 0.5 MG tablet Take 0.5 mg by mouth 2 (two) times daily as needed for anxiety.  Marland Kitchen aspirin EC 81 MG tablet Take 81 mg by mouth daily.  . DULoxetine (CYMBALTA) 60 MG capsule Take 60 mg by mouth daily.  Marland Kitchen HYDROcodone-acetaminophen (NORCO/VICODIN) 5-325 MG per tablet Take 1 tablet by mouth every 6 (six) hours as needed for moderate pain.  . Insulin Glargine (TOUJEO SOLOSTAR) 300 UNIT/ML SOPN Inject 20  Units into the skin.  . Insulin Pen Needle 31G X 4 MM MISC 1 each by Does not apply route at bedtime.  Marland Kitchen lisinopril (PRINIVIL,ZESTRIL) 40 MG tablet Take 40 mg by mouth daily.  . metFORMIN (GLUCOPHAGE) 1000 MG tablet TAKE ONE TABLET BY MOUTH TWICE DAILY  . pregabalin (LYRICA) 75 MG capsule Take 75 mg by mouth daily. In the morning and 150 mg in the evening.  Marland Kitchen rOPINIRole (REQUIP) 1 MG tablet Take 1 mg by mouth 3 (three) times daily.  . simvastatin (ZOCOR) 20 MG tablet Take 20 mg by mouth daily.  . [DISCONTINUED] empagliflozin (JARDIANCE) 25 MG TABS tablet Take 25 mg by mouth daily.   No facility-administered encounter medications on file as of 10/06/2015.    ALLERGIES: No Known Allergies VACCINATION STATUS:  There is no immunization history on file for this patient.  Diabetes  He presents for his follow-up diabetic visit. He has type 2 diabetes mellitus. His disease course has been improving. Pertinent negatives for hypoglycemia include no confusion, headaches, pallor or seizures. Pertinent negatives for diabetes include no chest pain, no fatigue, no polydipsia, no polyphagia, no polyuria and no weakness. Symptoms are improving. Risk factors for coronary artery disease include diabetes mellitus, hypertension and tobacco exposure. Current diabetic treatment includes insulin injections and  oral agent (monotherapy). He is compliant with treatment most of the time. His weight is increasing steadily. He is following a generally unhealthy diet. When asked about meal planning, he reported none. He has had a previous visit with a dietitian. He never participates in exercise. His breakfast blood glucose range is generally 130-140 mg/dl. An ACE inhibitor/angiotensin II receptor blocker is being taken.  Hyperlipidemia  Pertinent negatives include no chest pain, myalgias or shortness of breath.  Hypertension  Pertinent negatives include no chest pain, headaches, neck pain, palpitations or shortness of breath.      Review of Systems  Constitutional: Negative for fatigue and unexpected weight change.  HENT: Negative for dental problem, mouth sores and trouble swallowing.   Eyes: Negative for visual disturbance.  Respiratory: Negative for cough, choking, chest tightness, shortness of breath and wheezing.   Cardiovascular: Negative for chest pain, palpitations and leg swelling.  Gastrointestinal: Negative for abdominal distention, abdominal pain, constipation, diarrhea, nausea and vomiting.  Endocrine: Negative for polydipsia, polyphagia and polyuria.  Genitourinary: Negative for dysuria, flank pain, hematuria and urgency.  Musculoskeletal: Negative for back pain, gait problem, myalgias and neck pain.  Skin: Negative for pallor, rash and wound.  Neurological: Negative for seizures, syncope, weakness, numbness and headaches.  Psychiatric/Behavioral: Negative.  Negative for confusion and dysphoric mood.    Objective:    BP 108/65   Pulse 74   Ht 5' 10.3" (1.786 m)   Wt 210 lb (95.3 kg)   BMI 29.88 kg/m   Wt Readings from Last 3 Encounters:  10/06/15 210 lb (95.3 kg)  06/21/15 212 lb (96.2 kg)  03/23/15 210 lb (95.3 kg)    Physical Exam  Constitutional: He is oriented to person, place, and time. He appears well-developed and well-nourished. He is cooperative. No distress.  HENT:  Head: Normocephalic and atraumatic.  Eyes: EOM are normal.  Neck: Normal range of motion. Neck supple. No tracheal deviation present. No thyromegaly present.  Cardiovascular: Normal rate, S1 normal, S2 normal and normal heart sounds.  Exam reveals no gallop.   No murmur heard. Pulses:      Dorsalis pedis pulses are 1+ on the right side, and 1+ on the left side.       Posterior tibial pulses are 1+ on the right side, and 1+ on the left side.  Pulmonary/Chest: Breath sounds normal. No respiratory distress. He has no wheezes.  Abdominal: Soft. Bowel sounds are normal. He exhibits no distension. There is no  tenderness. There is no guarding and no CVA tenderness.  Musculoskeletal: He exhibits no edema.       Right shoulder: He exhibits no swelling and no deformity.  Neurological: He is alert and oriented to person, place, and time. He has normal strength and normal reflexes. No cranial nerve deficit or sensory deficit. Gait normal.  Skin: Skin is warm and dry. No rash noted. No cyanosis. Nails show no clubbing.  Psychiatric: He has a normal mood and affect. His speech is normal and behavior is normal. Judgment and thought content normal. Cognition and memory are normal.    Results for orders placed or performed in visit on 09/28/15  Comprehensive metabolic panel  Result Value Ref Range   Glucose 133 (H) 65 - 99 mg/dL   BUN 13 8 - 27 mg/dL   Creatinine, Ser 0.71 (L) 0.76 - 1.27 mg/dL   GFR calc non Af Amer 100 >59 mL/min/1.73   GFR calc Af Amer 115 >59 mL/min/1.73   BUN/Creatinine Ratio 18  10 - 24   Sodium 141 134 - 144 mmol/L   Potassium 4.8 3.5 - 5.2 mmol/L   Chloride 103 96 - 106 mmol/L   CO2 24 18 - 29 mmol/L   Calcium 9.1 8.6 - 10.2 mg/dL   Total Protein 6.4 6.0 - 8.5 g/dL   Albumin 4.4 3.6 - 4.8 g/dL   Globulin, Total 2.0 1.5 - 4.5 g/dL   Albumin/Globulin Ratio 2.2 1.2 - 2.2   Bilirubin Total 0.2 0.0 - 1.2 mg/dL   Alkaline Phosphatase 73 39 - 117 IU/L   AST 15 0 - 40 IU/L   ALT 16 0 - 44 IU/L  Hgb A1c w/o eAG  Result Value Ref Range   Hgb A1c MFr Bld 7.6 (H) 4.8 - 5.6 %   Diabetic Labs (most recent): Lab Results  Component Value Date   HGBA1C 7.6 (H) 09/28/2015   HGBA1C 8.2 (H) 06/12/2015   HGBA1C 7.7 02/20/2015    Assessment & Plan:   1. Uncontrolled type 2 diabetes mellitus with complication, with long-term current use of insulin ([place])   - patient remains at a high risk for more acute and chronic complications of diabetes which include CAD, CVA, CKD, retinopathy, and neuropathy. These are all discussed in detail with the patient.  Patient came with above target  glucose profile, and  recent A1c  7.6% improving from 8.2%  .    Glucose logs and insulin administration records pertaining to this visit,  to be scanned into patient's records.  Recent labs reviewed.   - I have re-counseled the patient on diet management and weight loss  by adopting a carbohydrate restricted / protein rich  Diet.  - Suggestion is made for patient to avoid simple carbohydrates   from their diet including Cakes , Desserts, Ice Cream,  Soda (  diet and regular) , Sweet Tea , Candies,  Chips, Cookies, Artificial Sweeteners,   and "Sugar-free" Products .  This will help patient to have stable blood glucose profile and potentially avoid unintended  Weight gain.  - Patient is advised to stick to a routine mealtimes to eat 3 meals  a day and avoid unnecessary snacks ( to snack only to correct hypoglycemia).  - The patient  has been  scheduled with Jearld Fenton, RDN, CDE for individualized DM education.  - I have approached patient with the following individualized plan to manage diabetes and patient agrees.  -He will continue on a basal insulin, Toujeo  20 units qhs, associated with monitoring of BG QAM. -I will continue Metformin 1000mg  po BID and Jardiance 25 mg by mouth daily. - Patient will be considered for incretin therapy as appropriate next visit. - Patient specific target  for A1c; LDL, HDL, Triglycerides, and  Waist Circumference were discussed in detail.  2) BP/HTN: Controlled. Continue current medications including ACEI/ARB. 3) Lipids/HPL:  Controlled, continue statins. 4)  Weight/Diet: CDE consult in progress, exercise, and carbohydrates information provided.  5) Chronic Care/Health Maintenance:  -Patient is on ACEI/ARB and Statin medications and encouraged to continue to follow up with Ophthalmology, Podiatrist at least yearly or according to recommendations, and advised to  stay away from smoking. I have recommended yearly flu vaccine and pneumonia vaccination at  least every 5 years; moderate intensity exercise for up to 150 minutes weekly; and  sleep for at least 7 hours a day.  I advised patient to maintain close follow up with their PCP for primary care needs.  Patient is asked to bring meter  and  blood glucose logs during their next visit.   Follow up plan: Return in about 3 months (around 01/05/2016) for follow up with pre-visit labs, meter, and logs.  Glade Lloyd, MD Phone: [phone]  Fax: [phone]   10/06/2015, 2:14 PM

## 2015-10-09 ENCOUNTER — Other Ambulatory Visit: Payer: Self-pay | Admitting: "Endocrinology

## 2015-12-11 ENCOUNTER — Other Ambulatory Visit: Payer: Self-pay | Admitting: "Endocrinology

## 2015-12-14 ENCOUNTER — Other Ambulatory Visit: Payer: Self-pay | Admitting: "Endocrinology

## 2015-12-14 MED ORDER — INSULIN GLARGINE 300 UNIT/ML ~~LOC~~ SOPN: 20.0000 [IU] | PEN_INJECTOR | Freq: Every day | SUBCUTANEOUS | 3 refills | Status: DC

## 2016-01-01 ENCOUNTER — Other Ambulatory Visit: Payer: Self-pay | Admitting: "Endocrinology

## 2016-01-02 LAB — COMPREHENSIVE METABOLIC PANEL
A/G RATIO: 1.4 (ref 1.2–2.2)
ALBUMIN: 3.8 g/dL (ref 3.6–4.8)
ALK PHOS: 66 IU/L (ref 39–117)
ALT: 13 IU/L (ref 0–44)
AST: 12 IU/L (ref 0–40)
BILIRUBIN TOTAL: 0.3 mg/dL (ref 0.0–1.2)
BUN / CREAT RATIO: 16 (ref 10–24)
BUN: 11 mg/dL (ref 8–27)
CHLORIDE: 101 mmol/L (ref 96–106)
CO2: 22 mmol/L (ref 18–29)
Calcium: 8.9 mg/dL (ref 8.6–10.2)
Creatinine, Ser: 0.68 mg/dL — ABNORMAL LOW (ref 0.76–1.27)
GFR calc non Af Amer: 102 mL/min/{1.73_m2} (ref >59)
GFR, EST AFRICAN AMERICAN: 117 mL/min/{1.73_m2} (ref >59)
GLOBULIN, TOTAL: 2.7 g/dL (ref 1.5–4.5)
GLUCOSE: 134 mg/dL — AB (ref 65–99)
POTASSIUM: 4.6 mmol/L (ref 3.5–5.2)
SODIUM: 141 mmol/L (ref 134–144)
TOTAL PROTEIN: 6.5 g/dL (ref 6.0–8.5)

## 2016-01-02 LAB — T4, FREE: FREE T4: 1.05 ng/dL (ref 0.82–1.77)

## 2016-01-02 LAB — LIPID PANEL W/O CHOL/HDL RATIO
CHOLESTEROL TOTAL: 160 mg/dL (ref 100–199)
HDL: 38 mg/dL — ABNORMAL LOW (ref >39)
LDL Calculated: 95 mg/dL (ref 0–99)
Triglycerides: 133 mg/dL (ref 0–149)
VLDL CHOLESTEROL CAL: 27 mg/dL (ref 5–40)

## 2016-01-02 LAB — HGB A1C W/O EAG: Hgb A1c MFr Bld: 7.7 % — ABNORMAL HIGH (ref 4.8–5.6)

## 2016-01-02 LAB — MICROALBUMIN, URINE: Microalbumin, Urine: <3 ug/mL

## 2016-01-02 LAB — TSH: TSH: 0.41 u[IU]/mL — AB (ref 0.450–4.500)

## 2016-01-09 ENCOUNTER — Ambulatory Visit (INDEPENDENT_AMBULATORY_CARE_PROVIDER_SITE_OTHER): Payer: PPO | Admitting: "Endocrinology

## 2016-01-09 ENCOUNTER — Encounter: Payer: Self-pay | Admitting: "Endocrinology

## 2016-01-09 VITALS — BP 130/79 | HR 75 | Ht 70.3 in | Wt 214.0 lb

## 2016-01-09 DIAGNOSIS — I10 Essential (primary) hypertension: Secondary | ICD-10-CM | POA: Diagnosis not present

## 2016-01-09 DIAGNOSIS — E118 Type 2 diabetes mellitus with unspecified complications: Secondary | ICD-10-CM

## 2016-01-09 DIAGNOSIS — E782 Mixed hyperlipidemia: Secondary | ICD-10-CM | POA: Diagnosis not present

## 2016-01-09 DIAGNOSIS — E1165 Type 2 diabetes mellitus with hyperglycemia: Secondary | ICD-10-CM

## 2016-01-09 DIAGNOSIS — E11319 Type 2 diabetes mellitus with unspecified diabetic retinopathy without macular edema: Secondary | ICD-10-CM

## 2016-01-09 DIAGNOSIS — Z794 Long term (current) use of insulin: Secondary | ICD-10-CM

## 2016-01-09 DIAGNOSIS — IMO0002 Reserved for concepts with insufficient information to code with codable children: Secondary | ICD-10-CM

## 2016-01-09 MED ORDER — INSULIN GLARGINE 300 UNIT/ML ~~LOC~~ SOPN: 24.0000 [IU] | PEN_INJECTOR | Freq: Every day | SUBCUTANEOUS | 3 refills | Status: DC

## 2016-01-09 NOTE — Progress Notes (Signed)
Subjective:    Patient ID: Xavier Caldwell, male    DOB: May 14, 1950,    Past Medical History:  Diagnosis Date  . Anxiety   . Arthritis   . Back pain   . Diabetes mellitus without complication ([place])   . High cholesterol   . History of gout   . Hypertension   . Neuropathy ([place])   . Sleep apnea    Stop Bang score of 4   Past Surgical History:  Procedure Laterality Date  . APPENDECTOMY    . CATARACT EXTRACTION W/PHACO Right 04/25/2014   Procedure: CATARACT EXTRACTION PHACO AND INTRAOCULAR LENS PLACEMENT (IOC);  Surgeon: Williams Che, MD;  Location: [facility];  Service: Ophthalmology;  Laterality: Right;  CDE:4.70  . CATARACT EXTRACTION W/PHACO Left 07/11/2014   Procedure: CATARACT EXTRACTION PHACO AND INTRAOCULAR LENS PLACEMENT LEFT EYE CDE=8.32;  Surgeon: Williams Che, MD;  Location: [facility];  Service: Ophthalmology;  Laterality: Left;  . RETINAL DETACHMENT SURGERY     Social History   Social History  . Marital status: Married    Spouse name: N/A  . Number of children: N/A  . Years of education: N/A   Social History Main Topics  . Smoking status: Former Smoker    Packs/day: 1.50    Years: 30.00    Types: Cigarettes    Quit date: 07/05/2012  . Smokeless tobacco: Never Used  . Alcohol use No  . Drug use: No  . Sexual activity: No   Other Topics Concern  . None   Social History Narrative  . None   Outpatient Encounter Prescriptions as of 01/09/2016  Medication Sig  . ALPRAZolam (XANAX) 0.5 MG tablet Take 0.5 mg by mouth 2 (two) times daily as needed for anxiety.  Marland Kitchen aspirin EC 81 MG tablet Take 81 mg by mouth daily.  . canagliflozin (INVOKANA) 100 MG TABS tablet Take by mouth daily before breakfast.  . DULoxetine (CYMBALTA) 60 MG capsule Take 60 mg by mouth daily.  Marland Kitchen HYDROcodone-acetaminophen (NORCO/VICODIN) 5-325 MG per tablet Take 1 tablet by mouth every 6 (six) hours as needed for moderate pain.  . Insulin Glargine (TOUJEO SOLOSTAR) 300 UNIT/ML SOPN Inject 24  Units into the skin at bedtime.  . Insulin Pen Needle 31G X 4 MM MISC 1 each by Does not apply route at bedtime.  Marland Kitchen lisinopril (PRINIVIL,ZESTRIL) 40 MG tablet Take 40 mg by mouth daily.  . metFORMIN (GLUCOPHAGE) 1000 MG tablet TAKE ONE TABLET BY MOUTH TWICE DAILY  . pregabalin (LYRICA) 75 MG capsule Take 75 mg by mouth daily. In the morning and 150 mg in the evening.  Marland Kitchen rOPINIRole (REQUIP) 1 MG tablet Take 1 mg by mouth 3 (three) times daily.  . simvastatin (ZOCOR) 20 MG tablet Take 20 mg by mouth daily.  . [DISCONTINUED] Insulin Glargine (TOUJEO SOLOSTAR) 300 UNIT/ML SOPN Inject 20 Units into the skin at bedtime.   No facility-administered encounter medications on file as of 01/09/2016.    ALLERGIES: No Known Allergies VACCINATION STATUS:  There is no immunization history on file for this patient.  Diabetes  He presents for his follow-up diabetic visit. He has type 2 diabetes mellitus. His disease course has been stable. Pertinent negatives for hypoglycemia include no confusion, headaches, pallor or seizures. Pertinent negatives for diabetes include no chest pain, no fatigue, no polydipsia, no polyphagia, no polyuria and no weakness. Symptoms are stable. Risk factors for coronary artery disease include diabetes mellitus, hypertension and tobacco exposure. Current diabetic  treatment includes insulin injections and oral agent (monotherapy). He is compliant with treatment most of the time. His weight is increasing steadily. He is following a generally unhealthy diet. When asked about meal planning, he reported none. He has had a previous visit with a dietitian. He never participates in exercise. His breakfast blood glucose range is generally 130-140 mg/dl. An ACE inhibitor/angiotensin II receptor blocker is being taken.  Hyperlipidemia  Pertinent negatives include no chest pain, myalgias or shortness of breath.  Hypertension  Pertinent negatives include no chest pain, headaches, neck pain,  palpitations or shortness of breath.     Review of Systems  Constitutional: Negative for fatigue and unexpected weight change.  HENT: Negative for dental problem, mouth sores and trouble swallowing.   Eyes: Negative for visual disturbance.  Respiratory: Negative for cough, choking, chest tightness, shortness of breath and wheezing.   Cardiovascular: Negative for chest pain, palpitations and leg swelling.  Gastrointestinal: Negative for abdominal distention, abdominal pain, constipation, diarrhea, nausea and vomiting.  Endocrine: Negative for polydipsia, polyphagia and polyuria.  Genitourinary: Negative for dysuria, flank pain, hematuria and urgency.  Musculoskeletal: Negative for back pain, gait problem, myalgias and neck pain.  Skin: Negative for pallor, rash and wound.  Neurological: Negative for seizures, syncope, weakness, numbness and headaches.  Psychiatric/Behavioral: Negative.  Negative for confusion and dysphoric mood.    Objective:    BP 130/79   Pulse 75   Ht 5' 10.3" (1.786 m)   Wt 214 lb (97.1 kg)   BMI 30.44 kg/m   Wt Readings from Last 3 Encounters:  01/09/16 214 lb (97.1 kg)  10/06/15 210 lb (95.3 kg)  06/21/15 212 lb (96.2 kg)    Physical Exam  Constitutional: He is oriented to person, place, and time. He appears well-developed and well-nourished. He is cooperative. No distress.  HENT:  Head: Normocephalic and atraumatic.  Eyes: EOM are normal.  Neck: Normal range of motion. Neck supple. No tracheal deviation present. No thyromegaly present.  Cardiovascular: Normal rate, S1 normal, S2 normal and normal heart sounds.  Exam reveals no gallop.   No murmur heard. Pulses:      Dorsalis pedis pulses are 1+ on the right side, and 1+ on the left side.       Posterior tibial pulses are 1+ on the right side, and 1+ on the left side.  Pulmonary/Chest: Breath sounds normal. No respiratory distress. He has no wheezes.  Abdominal: Soft. Bowel sounds are normal. He  exhibits no distension. There is no tenderness. There is no guarding and no CVA tenderness.  Musculoskeletal: He exhibits no edema.       Right shoulder: He exhibits no swelling and no deformity.  Neurological: He is alert and oriented to person, place, and time. He has normal strength and normal reflexes. No cranial nerve deficit or sensory deficit. Gait normal.  Skin: Skin is warm and dry. No rash noted. No cyanosis. Nails show no clubbing.  Psychiatric: He has a normal mood and affect. His speech is normal and behavior is normal. Judgment and thought content normal. Cognition and memory are normal.    Results for orders placed or performed in visit on 01/01/16  Comprehensive metabolic panel  Result Value Ref Range   Glucose 134 (H) 65 - 99 mg/dL   BUN 11 8 - 27 mg/dL   Creatinine, Ser 0.68 (L) 0.76 - 1.27 mg/dL   GFR calc non Af Amer 102 >59 mL/min/1.73   GFR calc Af Amer 117 >59 mL/min/1.73  BUN/Creatinine Ratio 16 10 - 24   Sodium 141 134 - 144 mmol/L   Potassium 4.6 3.5 - 5.2 mmol/L   Chloride 101 96 - 106 mmol/L   CO2 22 18 - 29 mmol/L   Calcium 8.9 8.6 - 10.2 mg/dL   Total Protein 6.5 6.0 - 8.5 g/dL   Albumin 3.8 3.6 - 4.8 g/dL   Globulin, Total 2.7 1.5 - 4.5 g/dL   Albumin/Globulin Ratio 1.4 1.2 - 2.2   Bilirubin Total 0.3 0.0 - 1.2 mg/dL   Alkaline Phosphatase 66 39 - 117 IU/L   AST 12 0 - 40 IU/L   ALT 13 0 - 44 IU/L  Lipid Panel w/o Chol/HDL Ratio  Result Value Ref Range   Cholesterol, Total 160 100 - 199 mg/dL   Triglycerides 133 0 - 149 mg/dL   HDL 38 (L) >39 mg/dL   VLDL Cholesterol Cal 27 5 - 40 mg/dL   LDL Calculated 95 0 - 99 mg/dL  Hgb A1c w/o eAG  Result Value Ref Range   Hgb A1c MFr Bld 7.7 (H) 4.8 - 5.6 %  T4, free  Result Value Ref Range   Free T4 1.05 0.82 - 1.77 ng/dL  TSH  Result Value Ref Range   TSH 0.410 (L) 0.450 - 4.500 uIU/mL  Microalbumin, urine  Result Value Ref Range   Microalbum.,U,Random <3.0 Not Estab. ug/mL   Diabetic Labs  (most recent): Lab Results  Component Value Date   HGBA1C 7.7 (H) 01/01/2016   HGBA1C 7.6 (H) 09/28/2015   HGBA1C 8.2 (H) 06/12/2015    Assessment & Plan:   1. Uncontrolled type 2 diabetes mellitus with complication, with long-term current use of insulin ([place])   - patient remains at a high risk for more acute and chronic complications of diabetes which include CAD, CVA, CKD, retinopathy, and neuropathy. These are all discussed in detail with the patient.  Patient came with near target glucose profile, and  recent A1c  7.7% improving from 8.2%  .    Glucose logs and insulin administration records pertaining to this visit,  to be scanned into patient's records.  Recent labs reviewed.   - I have re-counseled the patient on diet management and weight loss  by adopting a carbohydrate restricted / protein rich  Diet.  - Suggestion is made for patient to avoid simple carbohydrates   from their diet including Cakes , Desserts, Ice Cream,  Soda (  diet and regular) , Sweet Tea , Candies,  Chips, Cookies, Artificial Sweeteners,   and "Sugar-free" Products .  This will help patient to have stable blood glucose profile and potentially avoid unintended  Weight gain.  - Patient is advised to stick to a routine mealtimes to eat 3 meals  a day and avoid unnecessary snacks ( to snack only to correct hypoglycemia).  - The patient  has been  scheduled with Jearld Fenton, RDN, CDE for individualized DM education.  - I have approached patient with the following individualized plan to manage diabetes and patient agrees.  - I will increase Toujeo  To 24 units qhs, associated with monitoring of BG QAM. -I will continue Metformin 1000mg  po BID and Invokana 100 mg by mouth daily. - Patient will be considered for incretin therapy as appropriate next visit. - Patient specific target  for A1c; LDL, HDL, Triglycerides, and  Waist Circumference were discussed in detail.  2) BP/HTN: Controlled. Continue current  medications including ACEI/ARB. 3) Lipids/HPL:  Controlled, continue statins. 4)  Weight/Diet: CDE consult in progress, exercise, and carbohydrates information provided.  5) Chronic Care/Health Maintenance:  -Patient is on ACEI/ARB and Statin medications and encouraged to continue to follow up with Ophthalmology, Podiatrist at least yearly or according to recommendations, and advised to  stay away from smoking. I have recommended yearly flu vaccine and pneumonia vaccination at least every 5 years; moderate intensity exercise for up to 150 minutes weekly; and  sleep for at least 7 hours a day.  I advised patient to maintain close follow up with their PCP for primary care needs.  Patient is asked to bring meter and  blood glucose logs during their next visit.   Follow up plan: Return in about 3 months (around 04/08/2016).  Glade Lloyd, MD Phone: [phone]  Fax: [phone]   01/09/2016, 8:57 AM

## 2016-01-09 NOTE — Patient Instructions (Signed)

## 2016-01-11 ENCOUNTER — Other Ambulatory Visit: Payer: Self-pay | Admitting: "Endocrinology

## 2016-03-20 DIAGNOSIS — E669 Obesity, unspecified: Secondary | ICD-10-CM | POA: Diagnosis not present

## 2016-03-20 DIAGNOSIS — Z1389 Encounter for screening for other disorder: Secondary | ICD-10-CM | POA: Diagnosis not present

## 2016-03-20 DIAGNOSIS — M1991 Primary osteoarthritis, unspecified site: Secondary | ICD-10-CM | POA: Diagnosis not present

## 2016-03-20 DIAGNOSIS — G894 Chronic pain syndrome: Secondary | ICD-10-CM | POA: Diagnosis not present

## 2016-03-20 DIAGNOSIS — Z0001 Encounter for general adult medical examination with abnormal findings: Secondary | ICD-10-CM | POA: Diagnosis not present

## 2016-03-20 DIAGNOSIS — M2550 Pain in unspecified joint: Secondary | ICD-10-CM | POA: Diagnosis not present

## 2016-03-20 DIAGNOSIS — Z6831 Body mass index (BMI) 31.0-31.9, adult: Secondary | ICD-10-CM | POA: Diagnosis not present

## 2016-03-20 DIAGNOSIS — E11319 Type 2 diabetes mellitus with unspecified diabetic retinopathy without macular edema: Secondary | ICD-10-CM | POA: Diagnosis not present

## 2016-03-20 DIAGNOSIS — E1165 Type 2 diabetes mellitus with hyperglycemia: Secondary | ICD-10-CM | POA: Diagnosis not present

## 2016-03-20 DIAGNOSIS — E6609 Other obesity due to excess calories: Secondary | ICD-10-CM | POA: Diagnosis not present

## 2016-04-01 ENCOUNTER — Other Ambulatory Visit: Payer: Self-pay | Admitting: "Endocrinology

## 2016-04-01 DIAGNOSIS — Z794 Long term (current) use of insulin: Secondary | ICD-10-CM | POA: Diagnosis not present

## 2016-04-01 DIAGNOSIS — E118 Type 2 diabetes mellitus with unspecified complications: Secondary | ICD-10-CM | POA: Diagnosis not present

## 2016-04-01 DIAGNOSIS — E1165 Type 2 diabetes mellitus with hyperglycemia: Secondary | ICD-10-CM | POA: Diagnosis not present

## 2016-04-01 LAB — COMPREHENSIVE METABOLIC PANEL
ALT: 11 U/L (ref 9–46)
AST: 12 U/L (ref 10–35)
Albumin: 3.9 g/dL (ref 3.6–5.1)
Alkaline Phosphatase: 58 U/L (ref 40–115)
BILIRUBIN TOTAL: 0.5 mg/dL (ref 0.2–1.2)
BUN: 16 mg/dL (ref 7–25)
CHLORIDE: 103 mmol/L (ref 98–110)
CO2: 26 mmol/L (ref 20–31)
CREATININE: 0.83 mg/dL (ref 0.70–1.25)
Calcium: 9.3 mg/dL (ref 8.6–10.3)
Glucose, Bld: 133 mg/dL — ABNORMAL HIGH (ref 65–99)
Potassium: 4.4 mmol/L (ref 3.5–5.3)
SODIUM: 137 mmol/L (ref 135–146)
TOTAL PROTEIN: 6.2 g/dL (ref 6.1–8.1)

## 2016-04-01 LAB — HEMOGLOBIN A1C
HEMOGLOBIN A1C: 7.6 % — AB (ref <5.7)
Mean Plasma Glucose: 171 mg/dL

## 2016-04-03 DIAGNOSIS — T380X5A Adverse effect of glucocorticoids and synthetic analogues, initial encounter: Secondary | ICD-10-CM | POA: Diagnosis not present

## 2016-04-03 DIAGNOSIS — H40042 Steroid responder, left eye: Secondary | ICD-10-CM | POA: Diagnosis not present

## 2016-04-03 DIAGNOSIS — H33022 Retinal detachment with multiple breaks, left eye: Secondary | ICD-10-CM | POA: Diagnosis not present

## 2016-04-08 ENCOUNTER — Other Ambulatory Visit: Payer: Self-pay | Admitting: "Endocrinology

## 2016-04-08 ENCOUNTER — Encounter: Payer: Self-pay | Admitting: "Endocrinology

## 2016-04-08 ENCOUNTER — Ambulatory Visit (INDEPENDENT_AMBULATORY_CARE_PROVIDER_SITE_OTHER): Payer: Medicare HMO | Admitting: "Endocrinology

## 2016-04-08 ENCOUNTER — Telehealth: Payer: Self-pay

## 2016-04-08 VITALS — BP 146/82 | HR 72 | Ht 70.3 in | Wt 218.0 lb

## 2016-04-08 DIAGNOSIS — E118 Type 2 diabetes mellitus with unspecified complications: Secondary | ICD-10-CM

## 2016-04-08 DIAGNOSIS — Z794 Long term (current) use of insulin: Secondary | ICD-10-CM

## 2016-04-08 DIAGNOSIS — I10 Essential (primary) hypertension: Secondary | ICD-10-CM

## 2016-04-08 DIAGNOSIS — E1165 Type 2 diabetes mellitus with hyperglycemia: Secondary | ICD-10-CM

## 2016-04-08 DIAGNOSIS — E782 Mixed hyperlipidemia: Secondary | ICD-10-CM

## 2016-04-08 DIAGNOSIS — IMO0002 Reserved for concepts with insufficient information to code with codable children: Secondary | ICD-10-CM

## 2016-04-08 NOTE — Progress Notes (Signed)
Subjective:    Patient ID: Xavier Caldwell, male    DOB: [birth year],    Past Medical History:  Diagnosis Date  . Anxiety   . Arthritis   . Back pain   . Diabetes mellitus without complication ([place])   . High cholesterol   . History of gout   . Hypertension   . Neuropathy ([place])   . Sleep apnea    Stop Bang score of 4   Past Surgical History:  Procedure Laterality Date  . APPENDECTOMY    . CATARACT EXTRACTION W/PHACO Right 04/25/2014   Procedure: CATARACT EXTRACTION PHACO AND INTRAOCULAR LENS PLACEMENT (IOC);  Surgeon: Williams Che, MD;  Location: [facility];  Service: Ophthalmology;  Laterality: Right;  CDE:4.70  . CATARACT EXTRACTION W/PHACO Left 07/11/2014   Procedure: CATARACT EXTRACTION PHACO AND INTRAOCULAR LENS PLACEMENT LEFT EYE CDE=8.32;  Surgeon: Williams Che, MD;  Location: [facility];  Service: Ophthalmology;  Laterality: Left;  . RETINAL DETACHMENT SURGERY     Social History   Social History  . Marital status: Married    Spouse name: N/A  . Number of children: N/A  . Years of education: N/A   Social History Main Topics  . Smoking status: Former Smoker    Packs/day: 1.50    Years: 30.00    Types: Cigarettes    Quit date: 07/05/2012  . Smokeless tobacco: Never Used  . Alcohol use No  . Drug use: No  . Sexual activity: No   Other Topics Concern  . None   Social History Narrative  . None   Outpatient Encounter Prescriptions as of 04/08/2016  Medication Sig  . B Complex-C (SUPER B COMPLEX PO) Take by mouth.  . Cholecalciferol (VITAMIN D PO) Take by mouth daily.  Marland Kitchen lidocaine (LIDODERM) 5 % Place 1 patch onto the skin daily. Remove & Discard patch within 12 hours or as directed by MD  . naloxegol oxalate (MOVANTIK) 25 MG TABS tablet Take by mouth daily.  Marland Kitchen ALPRAZolam (XANAX) 0.5 MG tablet Take 0.5 mg by mouth 2 (two) times daily as needed for anxiety.  Marland Kitchen aspirin EC 81 MG tablet Take 81 mg by mouth daily.  . canagliflozin (INVOKANA) 100 MG TABS tablet Take by  mouth daily before breakfast.  . DULoxetine (CYMBALTA) 60 MG capsule Take 60 mg by mouth daily.  Marland Kitchen HYDROcodone-acetaminophen (NORCO/VICODIN) 5-325 MG per tablet Take 1 tablet by mouth every 6 (six) hours as needed for moderate pain.  . Insulin Glargine (TOUJEO SOLOSTAR) 300 UNIT/ML SOPN Inject 24 Units into the skin at bedtime.  . Insulin Pen Needle 31G X 4 MM MISC 1 each by Does not apply route at bedtime.  Marland Kitchen lisinopril (PRINIVIL,ZESTRIL) 40 MG tablet Take 40 mg by mouth daily.  . metFORMIN (GLUCOPHAGE) 1000 MG tablet TAKE ONE TABLET BY MOUTH TWICE DAILY  . pregabalin (LYRICA) 75 MG capsule Take 75 mg by mouth daily. In the morning and 150 mg in the evening.  Marland Kitchen rOPINIRole (REQUIP) 1 MG tablet Take 1 mg by mouth 3 (three) times daily.  . simvastatin (ZOCOR) 20 MG tablet Take 20 mg by mouth daily.   No facility-administered encounter medications on file as of 04/08/2016.    ALLERGIES: No Known Allergies VACCINATION STATUS:  There is no immunization history on file for this patient.  Diabetes  He presents for his follow-up diabetic visit. He has type 2 diabetes mellitus. His disease course has been improving. Pertinent negatives for hypoglycemia include no confusion, headaches,  pallor or seizures. Pertinent negatives for diabetes include no chest pain, no fatigue, no polydipsia, no polyphagia, no polyuria and no weakness. Symptoms are improving. Risk factors for coronary artery disease include diabetes mellitus, hypertension and tobacco exposure. Current diabetic treatment includes insulin injections and oral agent (monotherapy). He is compliant with treatment most of the time. His weight is increasing steadily. He is following a generally unhealthy diet. When asked about meal planning, he reported none. He has had a previous visit with a dietitian. He never participates in exercise. His breakfast blood glucose range is generally 130-140 mg/dl. An ACE inhibitor/angiotensin II receptor blocker is  being taken.  Hyperlipidemia  Pertinent negatives include no chest pain, myalgias or shortness of breath.  Hypertension  Pertinent negatives include no chest pain, headaches, neck pain, palpitations or shortness of breath.     Review of Systems  Constitutional: Negative for fatigue and unexpected weight change.  HENT: Negative for dental problem, mouth sores and trouble swallowing.   Eyes: Negative for visual disturbance.  Respiratory: Negative for cough, choking, chest tightness, shortness of breath and wheezing.   Cardiovascular: Negative for chest pain, palpitations and leg swelling.  Gastrointestinal: Negative for abdominal distention, abdominal pain, constipation, diarrhea, nausea and vomiting.  Endocrine: Negative for polydipsia, polyphagia and polyuria.  Genitourinary: Negative for dysuria, flank pain, hematuria and urgency.  Musculoskeletal: Negative for back pain, gait problem, myalgias and neck pain.  Skin: Negative for pallor, rash and wound.  Neurological: Negative for seizures, syncope, weakness, numbness and headaches.  Psychiatric/Behavioral: Negative.  Negative for confusion and dysphoric mood.    Objective:    BP (!) 146/82   Pulse 72   Ht 5' 10.3" (1.786 m)   Wt 218 lb (98.9 kg)   BMI 31.01 kg/m   Wt Readings from Last 3 Encounters:  04/08/16 218 lb (98.9 kg)  01/09/16 214 lb (97.1 kg)  10/06/15 210 lb (95.3 kg)    Physical Exam  Constitutional: He is oriented to person, place, and time. He appears well-developed and well-nourished. He is cooperative. No distress.  HENT:  Head: Normocephalic and atraumatic.  Eyes: EOM are normal.  Neck: Normal range of motion. Neck supple. No tracheal deviation present. No thyromegaly present.  Cardiovascular: Normal rate, S1 normal, S2 normal and normal heart sounds.  Exam reveals no gallop.   No murmur heard. Pulses:      Dorsalis pedis pulses are 1+ on the right side, and 1+ on the left side.       Posterior tibial  pulses are 1+ on the right side, and 1+ on the left side.  Pulmonary/Chest: Breath sounds normal. No respiratory distress. He has no wheezes.  Abdominal: Soft. Bowel sounds are normal. He exhibits no distension. There is no tenderness. There is no guarding and no CVA tenderness.  Musculoskeletal: He exhibits no edema.       Right shoulder: He exhibits no swelling and no deformity.  Neurological: He is alert and oriented to person, place, and time. He has normal strength and normal reflexes. No cranial nerve deficit or sensory deficit. Gait normal.  Skin: Skin is warm and dry. No rash noted. No cyanosis. Nails show no clubbing.  Psychiatric: He has a normal mood and affect. His speech is normal and behavior is normal. Judgment and thought content normal. Cognition and memory are normal.    Results for orders placed or performed in visit on 04/01/16  Comprehensive metabolic panel  Result Value Ref Range   Sodium 137 135 -  146 mmol/L   Potassium 4.4 3.5 - 5.3 mmol/L   Chloride 103 98 - 110 mmol/L   CO2 26 20 - 31 mmol/L   Glucose, Bld 133 (H) 65 - 99 mg/dL   BUN 16 7 - 25 mg/dL   Creat 0.83 0.70 - 1.25 mg/dL   Total Bilirubin 0.5 0.2 - 1.2 mg/dL   Alkaline Phosphatase 58 40 - 115 U/L   AST 12 10 - 35 U/L   ALT 11 9 - 46 U/L   Total Protein 6.2 6.1 - 8.1 g/dL   Albumin 3.9 3.6 - 5.1 g/dL   Calcium 9.3 8.6 - 10.3 mg/dL  Hemoglobin A1c  Result Value Ref Range   Hgb A1c MFr Bld 7.6 (H) <5.7 %   Mean Plasma Glucose 171 mg/dL   Diabetic Labs (most recent): Lab Results  Component Value Date   HGBA1C 7.6 (H) 04/01/2016   HGBA1C 7.7 (H) 01/01/2016   HGBA1C 7.6 (H) 09/28/2015    Assessment & Plan:   1. Uncontrolled type 2 diabetes mellitus with complication, with long-term current use of insulin ([place])   - patient remains at a high risk for more acute and chronic complications of diabetes which include CAD, CVA, CKD, retinopathy, and neuropathy. These are all discussed in detail with  the patient.  Patient came with near target glucose profile, and  recent A1c  7.6% improving from 8.2%  .    Glucose logs and insulin administration records pertaining to this visit,  to be scanned into patient's records.  Recent labs reviewed.   - I have re-counseled the patient on diet management and weight loss  by adopting a carbohydrate restricted / protein rich  Diet.  - Suggestion is made for patient to avoid simple carbohydrates   from their diet including Cakes , Desserts, Ice Cream,  Soda (  diet and regular) , Sweet Tea , Candies,  Chips, Cookies, Artificial Sweeteners,   and "Sugar-free" Products .  This will help patient to have stable blood glucose profile and potentially avoid unintended  Weight gain.  - Patient is advised to stick to a routine mealtimes to eat 3 meals  a day and avoid unnecessary snacks ( to snack only to correct hypoglycemia).  - The patient  has been  scheduled with Jearld Fenton, RDN, CDE for individualized DM education.  - I have approached patient with the following individualized plan to manage diabetes and patient agrees.  - I will continueToujeo  24 units qhs, associated with monitoring of BG QAM. -I will continue Metformin 1000mg  po BID and Invokana 100 mg by mouth daily. - Patient will be considered for incretin therapy as appropriate next visit. - Patient specific target  for A1c; LDL, HDL, Triglycerides, and  Waist Circumference were discussed in detail.  2) BP/HTN: Controlled. Continue current medications including ACEI/ARB. 3) Lipids/HPL:  Controlled, continue statins. 4)  Weight/Diet: CDE consult in progress, exercise, and carbohydrates information provided.  5) Chronic Care/Health Maintenance:  -Patient is on ACEI/ARB and Statin medications and encouraged to continue to follow up with Ophthalmology, Podiatrist at least yearly or according to recommendations, and advised to  stay away from smoking. I have recommended yearly flu vaccine and  pneumonia vaccination at least every 5 years; moderate intensity exercise for up to 150 minutes weekly; and  sleep for at least 7 hours a day.  I advised patient to maintain close follow up with their PCP for primary care needs.  Patient is asked to bring meter  and  blood glucose logs during their next visit.   Follow up plan: Return in about 3 months (around 07/09/2016) for follow up with pre-visit labs, meter, and logs.  Glade Lloyd, MD Phone: [phone]  Fax: [phone]   04/08/2016, 9:09 AM

## 2016-04-08 NOTE — Patient Instructions (Signed)

## 2016-04-08 NOTE — Telephone Encounter (Signed)
Pt received triage letter from [facility]> Please call [phone]

## 2016-04-08 NOTE — Telephone Encounter (Signed)
Patient needs ov due to polypharmacy and need for propofol.

## 2016-04-08 NOTE — Telephone Encounter (Signed)
Tried to call with no answer  

## 2016-04-08 NOTE — Telephone Encounter (Signed)
Gastroenterology Pre-Procedure Review  Request Date: Requesting Physician:   PATIENT REVIEW QUESTIONS: The patient responded to the following health history questions as indicated:    1. Diabetes Melitis: YES 2. Joint replacements in the past 12 months: NO 3. Major health problems in the past 3 months: NO 4. Has an artificial valve or MVP: NO 5. Has a defibrillator: NO 6. Has been advised in past to take antibiotics in advance of a procedure like teeth cleaning: NO 7. Family history of colon cancer: NO 8. Alcohol Use:NO 9. History of sleep apnea: NO 10. History of coronary artery or other vascular stents placed within the last 12 months: NO    MEDICATIONS & ALLERGIES:    Patient reports the following regarding taking any blood thinners:   Plavix? NO Aspirin? NO Coumadin? NO Brilinta? NO Xarelto? NO Eliquis? NO Pradaxa? NO Savaysa? NO Effient? NO  Patient confirms/reports the following medications:  Current Outpatient Prescriptions  Medication Sig Dispense Refill  . ALPRAZolam (XANAX) 0.5 MG tablet Take 0.5 mg by mouth 2 (two) times daily as needed for anxiety.    Marland Kitchen aspirin EC 81 MG tablet Take 81 mg by mouth daily.    . B Complex-C (SUPER B COMPLEX PO) Take by mouth.    . canagliflozin (INVOKANA) 100 MG TABS tablet Take by mouth daily before breakfast.    . Cholecalciferol (VITAMIN D PO) Take by mouth daily.    . DULoxetine (CYMBALTA) 60 MG capsule Take 60 mg by mouth daily.    Marland Kitchen HYDROcodone-acetaminophen (NORCO/VICODIN) 5-325 MG per tablet Take 1 tablet by mouth every 6 (six) hours as needed for moderate pain.    . Insulin Glargine (TOUJEO SOLOSTAR) 300 UNIT/ML SOPN Inject 24 Units into the skin at bedtime. 3 pen 3  . Insulin Pen Needle 31G X 4 MM MISC 1 each by Does not apply route at bedtime. 100 each 5  . lidocaine (LIDODERM) 5 % Place 1 patch onto the skin daily. Remove & Discard patch within 12 hours or as directed by MD    . lisinopril (PRINIVIL,ZESTRIL) 40 MG tablet  Take 40 mg by mouth daily.    . metFORMIN (GLUCOPHAGE) 1000 MG tablet TAKE ONE TABLET BY MOUTH TWICE DAILY 60 tablet 2  . naloxegol oxalate (MOVANTIK) 25 MG TABS tablet Take by mouth daily.    . pregabalin (LYRICA) 75 MG capsule Take 75 mg by mouth daily. In the morning and 150 mg in the evening.    Marland Kitchen rOPINIRole (REQUIP) 1 MG tablet Take 1 mg by mouth 3 (three) times daily.    . simvastatin (ZOCOR) 20 MG tablet Take 20 mg by mouth daily.     No current facility-administered medications for this visit.     Patient confirms/reports the following allergies:  No Known Allergies  No orders of the defined types were placed in this encounter.   AUTHORIZATION INFORMATION Primary Insurance:ANTEA ,  ID #: [id number],  Group #: [id number] Pre-Cert / Auth required:  Pre-Cert / Auth #:   SCHEDULE INFORMATION: Procedure has been scheduled as follows:  Date: , Time:   Location:   This Gastroenterology Pre-Precedure Review Form is being routed to the following provider(s):

## 2016-04-09 NOTE — Telephone Encounter (Signed)
Pt is set up for OV on 05/09/16 @ 11:00 am. He is aware

## 2016-04-10 DIAGNOSIS — H33022 Retinal detachment with multiple breaks, left eye: Secondary | ICD-10-CM | POA: Diagnosis not present

## 2016-04-10 DIAGNOSIS — T380X5A Adverse effect of glucocorticoids and synthetic analogues, initial encounter: Secondary | ICD-10-CM | POA: Diagnosis not present

## 2016-04-10 DIAGNOSIS — H353112 Nonexudative age-related macular degeneration, right eye, intermediate dry stage: Secondary | ICD-10-CM | POA: Diagnosis not present

## 2016-04-10 DIAGNOSIS — H40042 Steroid responder, left eye: Secondary | ICD-10-CM | POA: Diagnosis not present

## 2016-04-10 DIAGNOSIS — E113553 Type 2 diabetes mellitus with stable proliferative diabetic retinopathy, bilateral: Secondary | ICD-10-CM | POA: Diagnosis not present

## 2016-05-01 DIAGNOSIS — T8529XA Other mechanical complication of intraocular lens, initial encounter: Secondary | ICD-10-CM | POA: Diagnosis not present

## 2016-05-01 DIAGNOSIS — H44752 Retained (nonmagnetic) (old) foreign body in vitreous body, left eye: Secondary | ICD-10-CM | POA: Diagnosis not present

## 2016-05-09 ENCOUNTER — Ambulatory Visit: Payer: Self-pay | Admitting: Gastroenterology

## 2016-06-07 DIAGNOSIS — J069 Acute upper respiratory infection, unspecified: Secondary | ICD-10-CM | POA: Diagnosis not present

## 2016-06-07 DIAGNOSIS — Z79899 Other long term (current) drug therapy: Secondary | ICD-10-CM | POA: Diagnosis not present

## 2016-06-07 DIAGNOSIS — E6609 Other obesity due to excess calories: Secondary | ICD-10-CM | POA: Diagnosis not present

## 2016-06-07 DIAGNOSIS — H5330 Unspecified disorder of binocular vision: Secondary | ICD-10-CM | POA: Diagnosis not present

## 2016-06-07 DIAGNOSIS — G959 Disease of spinal cord, unspecified: Secondary | ICD-10-CM | POA: Diagnosis not present

## 2016-06-07 DIAGNOSIS — R69 Illness, unspecified: Secondary | ICD-10-CM | POA: Diagnosis not present

## 2016-06-07 DIAGNOSIS — E119 Type 2 diabetes mellitus without complications: Secondary | ICD-10-CM | POA: Diagnosis not present

## 2016-06-07 DIAGNOSIS — Z6830 Body mass index (BMI) 30.0-30.9, adult: Secondary | ICD-10-CM | POA: Diagnosis not present

## 2016-06-09 ENCOUNTER — Other Ambulatory Visit: Payer: Self-pay | Admitting: "Endocrinology

## 2016-06-11 ENCOUNTER — Telehealth: Payer: Self-pay | Admitting: "Endocrinology

## 2016-06-11 NOTE — Telephone Encounter (Signed)
Freda Munro is asking if Dr. [name] would change Xavier Caldwell Invokana to [facility] purposes. Freda Munro states that she has assistance to pay for the [employer]

## 2016-06-12 ENCOUNTER — Other Ambulatory Visit: Payer: Self-pay | Admitting: "Endocrinology

## 2016-06-12 MED ORDER — EMPAGLIFLOZIN 10 MG PO TABS: 10.0000 mg | ORAL_TABLET | Freq: Every day | ORAL | 3 refills | Status: DC

## 2016-06-12 NOTE — Telephone Encounter (Signed)
Yes, I will change his Invokana to Jardiance 10 mg by mouth every morning.

## 2016-06-13 DIAGNOSIS — Z1211 Encounter for screening for malignant neoplasm of colon: Secondary | ICD-10-CM | POA: Diagnosis not present

## 2016-06-13 DIAGNOSIS — T380X5A Adverse effect of glucocorticoids and synthetic analogues, initial encounter: Secondary | ICD-10-CM | POA: Diagnosis not present

## 2016-06-13 DIAGNOSIS — H401121 Primary open-angle glaucoma, left eye, mild stage: Secondary | ICD-10-CM | POA: Diagnosis not present

## 2016-06-14 ENCOUNTER — Telehealth: Payer: Self-pay | Admitting: *Deleted

## 2016-06-14 ENCOUNTER — Other Ambulatory Visit: Payer: Self-pay | Admitting: "Endocrinology

## 2016-06-14 MED ORDER — EMPAGLIFLOZIN 10 MG PO TABS: 10.0000 mg | ORAL_TABLET | Freq: Every day | ORAL | 0 refills | Status: DC

## 2016-06-14 NOTE — Patient Outreach (Addendum)
[facility]) Care Management  06/14/2016  Xavier Caldwell [birth year] [id number]   Care Coordination  Xavier Caldwell CM received an email from Xavier Caldwell at [facility] [phone] ext. [phone] inquiring if pt eligible for Xavier Caldwell services and noting he is in donut hole with medications Xavier Caldwell CM checked Pt demographic information This pt is listed per [employer] media tab to have [name] which would make him not eligible for Xavier Caldwell pharmacy/CM services at this time if verified to be correct  Plan Xavier Caldwell updated and encouraged to have him check with [employer] CM services (using toll free numbers for customer services or his pharmacy/medication vendor on the insurance card and/or his pharmacist for possible medication donut hole assistance programs available to him if he is verified to be an Xavier Caldwell patient  Xavier Millin L. Lavina Hamman, RN, BSN, [facility] [phone]

## 2016-06-18 ENCOUNTER — Other Ambulatory Visit: Payer: Self-pay | Admitting: "Endocrinology

## 2016-06-24 ENCOUNTER — Other Ambulatory Visit: Payer: Self-pay

## 2016-06-24 MED ORDER — EMPAGLIFLOZIN 10 MG PO TABS: 10.0000 mg | ORAL_TABLET | Freq: Every day | ORAL | 1 refills | Status: DC

## 2016-06-26 ENCOUNTER — Ambulatory Visit: Payer: Self-pay | Admitting: Nurse Practitioner

## 2016-07-03 ENCOUNTER — Other Ambulatory Visit: Payer: Self-pay | Admitting: "Endocrinology

## 2016-07-03 DIAGNOSIS — E1165 Type 2 diabetes mellitus with hyperglycemia: Secondary | ICD-10-CM | POA: Diagnosis not present

## 2016-07-03 DIAGNOSIS — Z794 Long term (current) use of insulin: Secondary | ICD-10-CM | POA: Diagnosis not present

## 2016-07-03 DIAGNOSIS — E118 Type 2 diabetes mellitus with unspecified complications: Secondary | ICD-10-CM | POA: Diagnosis not present

## 2016-07-04 LAB — COMPREHENSIVE METABOLIC PANEL
A/G RATIO: 1.5 (ref 1.2–2.2)
ALT: 13 IU/L (ref 0–44)
AST: 13 IU/L (ref 0–40)
Albumin: 4 g/dL (ref 3.6–4.8)
Alkaline Phosphatase: 72 IU/L (ref 39–117)
BILIRUBIN TOTAL: 0.2 mg/dL (ref 0.0–1.2)
BUN/Creatinine Ratio: 20 (ref 10–24)
BUN: 15 mg/dL (ref 8–27)
CALCIUM: 9.4 mg/dL (ref 8.6–10.2)
CHLORIDE: 102 mmol/L (ref 96–106)
CO2: 22 mmol/L (ref 18–29)
Creatinine, Ser: 0.76 mg/dL (ref 0.76–1.27)
GFR, EST AFRICAN AMERICAN: 112 mL/min/{1.73_m2} (ref >59)
GFR, EST NON AFRICAN AMERICAN: 97 mL/min/{1.73_m2} (ref >59)
GLOBULIN, TOTAL: 2.6 g/dL (ref 1.5–4.5)
Glucose: 159 mg/dL — ABNORMAL HIGH (ref 65–99)
POTASSIUM: 5.1 mmol/L (ref 3.5–5.2)
SODIUM: 138 mmol/L (ref 134–144)
Total Protein: 6.6 g/dL (ref 6.0–8.5)

## 2016-07-04 LAB — HGB A1C W/O EAG: Hgb A1c MFr Bld: 8.7 % — ABNORMAL HIGH (ref 4.8–5.6)

## 2016-07-10 ENCOUNTER — Encounter: Payer: Self-pay | Admitting: "Endocrinology

## 2016-07-10 ENCOUNTER — Ambulatory Visit (INDEPENDENT_AMBULATORY_CARE_PROVIDER_SITE_OTHER): Payer: Medicare HMO | Admitting: "Endocrinology

## 2016-07-10 VITALS — BP 129/80 | HR 77 | Ht 70.3 in | Wt 212.0 lb

## 2016-07-10 DIAGNOSIS — E11319 Type 2 diabetes mellitus with unspecified diabetic retinopathy without macular edema: Secondary | ICD-10-CM | POA: Diagnosis not present

## 2016-07-10 DIAGNOSIS — E1165 Type 2 diabetes mellitus with hyperglycemia: Secondary | ICD-10-CM

## 2016-07-10 DIAGNOSIS — E782 Mixed hyperlipidemia: Secondary | ICD-10-CM

## 2016-07-10 DIAGNOSIS — I10 Essential (primary) hypertension: Secondary | ICD-10-CM

## 2016-07-10 DIAGNOSIS — IMO0002 Reserved for concepts with insufficient information to code with codable children: Secondary | ICD-10-CM

## 2016-07-10 DIAGNOSIS — E118 Type 2 diabetes mellitus with unspecified complications: Secondary | ICD-10-CM | POA: Diagnosis not present

## 2016-07-10 DIAGNOSIS — Z794 Long term (current) use of insulin: Secondary | ICD-10-CM | POA: Diagnosis not present

## 2016-07-10 MED ORDER — EMPAGLIFLOZIN 25 MG PO TABS: 25.0000 mg | ORAL_TABLET | Freq: Every day | ORAL | 1 refills | Status: DC

## 2016-07-10 NOTE — Progress Notes (Signed)
Subjective:    Patient ID: Xavier Caldwell, male    DOB: [birth year],    Past Medical History:  Diagnosis Date  . Anxiety   . Arthritis   . Back pain   . Diabetes mellitus without complication ([place])   . High cholesterol   . History of gout   . Hypertension   . Neuropathy   . Sleep apnea    Stop Bang score of 4   Past Surgical History:  Procedure Laterality Date  . APPENDECTOMY    . CATARACT EXTRACTION W/PHACO Right 04/25/2014   Procedure: CATARACT EXTRACTION PHACO AND INTRAOCULAR LENS PLACEMENT (I[facility]);  Surgeon: Williams Che, MD;  Location: [facility];  Service: Ophthalmology;  Laterality: Right;  CDE:4.70  . CATARACT EXTRACTION W/PHACO Left 07/11/2014   Procedure: CATARACT EXTRACTION PHACO AND INTRAOCULAR LENS PLACEMENT LEFT EYE CDE=8.32;  Surgeon: Williams Che, MD;  Location: [facility];  Service: Ophthalmology;  Laterality: Left;  . RETINAL DETACHMENT SURGERY     Social History   Social History  . Marital status: Married    Spouse name: N/A  . Number of children: N/A  . Years of education: N/A   Social History Main Topics  . Smoking status: Former Smoker    Packs/day: 1.50    Years: 30.00    Types: Cigarettes    Quit date: 07/05/2012  . Smokeless tobacco: Never Used  . Alcohol use No  . Drug use: No  . Sexual activity: No   Other Topics Concern  . None   Social History Narrative  . None   Outpatient Encounter Prescriptions as of 07/10/2016  Medication Sig  . ALPRAZolam (XANAX) 0.5 MG tablet Take 0.5 mg by mouth 2 (two) times daily as needed for anxiety.  Marland Kitchen aspirin EC 81 MG tablet Take 81 mg by mouth daily.  . B Complex-C (SUPER B COMPLEX PO) Take by mouth.  . Cholecalciferol (VITAMIN D PO) Take by mouth daily.  . DULoxetine (CYMBALTA) 60 MG capsule Take 60 mg by mouth daily.  . empagliflozin (JARDIANCE) 25 MG TABS tablet Take 25 mg by mouth daily.  Marland Kitchen HYDROcodone-acetaminophen (NORCO/VICODIN) 5-325 MG per tablet Take 1 tablet by mouth every 6 (six) hours as needed  for moderate pain.  . Insulin Glargine (TOUJEO SOLOSTAR) 300 UNIT/ML SOPN Inject 24 Units into the skin at bedtime.  . lidocaine (LIDODERM) 5 % Place 1 patch onto the skin daily. Remove & Discard patch within 12 hours or as directed by MD  . lisinopril (PRINIVIL,ZESTRIL) 40 MG tablet Take 40 mg by mouth daily.  . metFORMIN (GLUCOPHAGE) 1000 MG tablet TAKE 1 TABLET BY MOUTH TWICE DAILY  . naloxegol oxalate (MOVANTIK) 25 MG TABS tablet Take by mouth daily.  . pregabalin (LYRICA) 75 MG capsule Take 75 mg by mouth daily. In the morning and 150 mg in the evening.  Marland Kitchen rOPINIRole (REQUIP) 1 MG tablet Take 1 mg by mouth 3 (three) times daily.  . simvastatin (ZOCOR) 20 MG tablet Take 20 mg by mouth daily.  Marland Kitchen UNIFINE PENTIPS 31G X 5 MM MISC USE TO INJECT INSULIN AT BEDTIME  . [DISCONTINUED] empagliflozin (JARDIANCE) 10 MG TABS tablet Take 10 mg by mouth daily with breakfast.   No facility-administered encounter medications on file as of 07/10/2016.    ALLERGIES: No Known Allergies VACCINATION STATUS:  There is no immunization history on file for this patient.  Diabetes  He presents for his follow-up diabetic visit. He has type 2 diabetes mellitus. His disease  course has been worsening. Pertinent negatives for hypoglycemia include no confusion, headaches, pallor or seizures. Pertinent negatives for diabetes include no chest pain, no fatigue, no polydipsia, no polyphagia, no polyuria and no weakness. Symptoms are worsening. Risk factors for coronary artery disease include diabetes mellitus, hypertension and tobacco exposure. Current diabetic treatment includes insulin injections and oral agent (monotherapy). He is compliant with treatment most of the time. His weight is decreasing steadily. He is following a generally unhealthy diet. When asked about meal planning, he reported none. He has had a previous visit with a dietitian. He never participates in exercise. His breakfast blood glucose range is generally  140-180 mg/dl. His overall blood glucose range is 180-200 mg/dl. An ACE inhibitor/angiotensin II receptor blocker is being taken.  Hyperlipidemia  Pertinent negatives include no chest pain, myalgias or shortness of breath.  Hypertension  Pertinent negatives include no chest pain, headaches, neck pain, palpitations or shortness of breath.     Review of Systems  Constitutional: Negative for fatigue and unexpected weight change.  HENT: Negative for dental problem, mouth sores and trouble swallowing.   Eyes: Negative for visual disturbance.  Respiratory: Negative for cough, choking, chest tightness, shortness of breath and wheezing.   Cardiovascular: Negative for chest pain, palpitations and leg swelling.  Gastrointestinal: Negative for abdominal distention, abdominal pain, constipation, diarrhea, nausea and vomiting.  Endocrine: Negative for polydipsia, polyphagia and polyuria.  Genitourinary: Negative for dysuria, flank pain, hematuria and urgency.  Musculoskeletal: Negative for back pain, gait problem, myalgias and neck pain.  Skin: Negative for pallor, rash and wound.  Neurological: Negative for seizures, syncope, weakness, numbness and headaches.  Psychiatric/Behavioral: Negative.  Negative for confusion and dysphoric mood.    Objective:    BP 129/80   Pulse 77   Ht 5' 10.3" (1.786 m)   Wt 212 lb (96.2 kg)   BMI 30.16 kg/m   Wt Readings from Last 3 Encounters:  07/10/16 212 lb (96.2 kg)  04/08/16 218 lb (98.9 kg)  01/09/16 214 lb (97.1 kg)    Physical Exam  Constitutional: He is oriented to person, place, and time. He appears well-developed and well-nourished. He is cooperative. No distress.  HENT:  Head: Normocephalic and atraumatic.  Eyes: EOM are normal.  Neck: Normal range of motion. Neck supple. No tracheal deviation present. No thyromegaly present.  Cardiovascular: Normal rate, S1 normal, S2 normal and normal heart sounds.  Exam reveals no gallop.   No murmur  heard. Pulses:      Dorsalis pedis pulses are 1+ on the right side, and 1+ on the left side.       Posterior tibial pulses are 1+ on the right side, and 1+ on the left side.  Pulmonary/Chest: Breath sounds normal. No respiratory distress. He has no wheezes.  Abdominal: Soft. Bowel sounds are normal. He exhibits no distension. There is no tenderness. There is no guarding and no CVA tenderness.  Musculoskeletal: He exhibits no edema.       Right shoulder: He exhibits no swelling and no deformity.  Neurological: He is alert and oriented to person, place, and time. He has normal strength and normal reflexes. No cranial nerve deficit or sensory deficit. Gait normal.  Skin: Skin is warm and dry. No rash noted. No cyanosis. Nails show no clubbing.  Psychiatric: He has a normal mood and affect. His speech is normal and behavior is normal. Judgment and thought content normal. Cognition and memory are normal.    Results for orders placed or  performed in visit on 07/03/16  Comprehensive metabolic panel  Result Value Ref Range   Glucose 159 (H) 65 - 99 mg/dL   BUN 15 8 - 27 mg/dL   Creatinine, Ser 0.76 0.76 - 1.27 mg/dL   GFR calc non Af Amer 97 >59 mL/min/1.73   GFR calc Af Amer 112 >59 mL/min/1.73   BUN/Creatinine Ratio 20 10 - 24   Sodium 138 134 - 144 mmol/L   Potassium 5.1 3.5 - 5.2 mmol/L   Chloride 102 96 - 106 mmol/L   CO2 22 18 - 29 mmol/L   Calcium 9.4 8.6 - 10.2 mg/dL   Total Protein 6.6 6.0 - 8.5 g/dL   Albumin 4.0 3.6 - 4.8 g/dL   Globulin, Total 2.6 1.5 - 4.5 g/dL   Albumin/Globulin Ratio 1.5 1.2 - 2.2   Bilirubin Total 0.2 0.0 - 1.2 mg/dL   Alkaline Phosphatase 72 39 - 117 IU/L   AST 13 0 - 40 IU/L   ALT 13 0 - 44 IU/L  Hgb A1c w/o eAG  Result Value Ref Range   Hgb A1c MFr Bld 8.7 (H) 4.8 - 5.6 %   Diabetic Labs (most recent): Lab Results  Component Value Date   HGBA1C 8.7 (H) 07/03/2016   HGBA1C 7.6 (H) 04/01/2016   HGBA1C 7.7 (H) 01/01/2016    Assessment & Plan:    1. Uncontrolled type 2 diabetes mellitus with complication, with long-term current use of insulin ([place])   - patient remains at a high risk for more acute and chronic complications of diabetes which include CAD, CVA, CKD, retinopathy, and neuropathy. These are all discussed in detail with the patient.  Patient came with above target glucose profile, and  recent A1c  8.7% increasing from 7.6%.    Glucose logs and insulin administration records pertaining to this visit,  to be scanned into patient's records.  Recent labs reviewed.   - I have re-counseled the patient on diet management and weight loss  by adopting a carbohydrate restricted / protein rich  Diet.  - Suggestion is made for patient to avoid simple carbohydrates   from his diet including Cakes , Desserts, Ice Cream,  Soda (  diet and regular) , Sweet Tea , Candies,  Chips, Cookies, Artificial Sweeteners,   and "Sugar-free" Products .  This will help patient to have stable blood glucose profile and potentially avoid unintended  Weight gain.  - Patient is advised to stick to a routine mealtimes to eat 3 meals  a day and avoid unnecessary snacks ( to snack only to correct hypoglycemia).  - The patient  has been  scheduled with Jearld Fenton, RDN, CDE for individualized DM education.  - I have approached patient with the following individualized plan to manage diabetes and patient agrees.  - I will increase Toujeo  to 30 units qhs, associated with monitoring of blood glucose 2 times a day-before breakfast and at bedtime.  -I will continue Metformin 1000mg  po BID and  Increase Jardiance to 25 mg by mouth every morning. Side effects and precautions discussed with him.   - Patient will be considered for incretin therapy as appropriate next visit. - Patient specific target  for A1c; LDL, HDL, Triglycerides, and  Waist Circumference were discussed in detail.  2) BP/HTN: Controlled. Continue current medications including ACEI/ARB. 3)  Lipids/HPL:  Controlled, continue statins. 4)  Weight/Diet: CDE consult in progress, exercise, and carbohydrates information provided.  5) Chronic Care/Health Maintenance:  -Patient is on ACEI/ARB and  Statin medications and encouraged to continue to follow up with Ophthalmology, Podiatrist at least yearly or according to recommendations, and advised to  stay away from smoking. I have recommended yearly flu vaccine and pneumonia vaccination at least every 5 years; moderate intensity exercise for up to 150 minutes weekly; and  sleep for at least 7 hours a day.  I advised patient to maintain close follow up with their PCP for primary care needs.  Patient is asked to bring meter and  blood glucose logs during his next visit.   Follow up plan: Return for meter, and logs.  Glade Lloyd, MD Phone: [phone]  Fax: [phone]   07/10/2016, 9:51 AM

## 2016-07-10 NOTE — Patient Instructions (Signed)

## 2016-07-22 DIAGNOSIS — R2689 Other abnormalities of gait and mobility: Secondary | ICD-10-CM | POA: Diagnosis not present

## 2016-07-22 DIAGNOSIS — G20 Parkinson's disease: Secondary | ICD-10-CM | POA: Diagnosis not present

## 2016-07-22 DIAGNOSIS — Z79899 Other long term (current) drug therapy: Secondary | ICD-10-CM | POA: Diagnosis not present

## 2016-07-22 DIAGNOSIS — G3184 Mild cognitive impairment, so stated: Secondary | ICD-10-CM | POA: Diagnosis not present

## 2016-07-22 DIAGNOSIS — E1140 Type 2 diabetes mellitus with diabetic neuropathy, unspecified: Secondary | ICD-10-CM | POA: Diagnosis not present

## 2016-07-22 DIAGNOSIS — I10 Essential (primary) hypertension: Secondary | ICD-10-CM | POA: Diagnosis not present

## 2016-07-22 DIAGNOSIS — E785 Hyperlipidemia, unspecified: Secondary | ICD-10-CM | POA: Diagnosis not present

## 2016-07-22 DIAGNOSIS — M549 Dorsalgia, unspecified: Secondary | ICD-10-CM | POA: Diagnosis not present

## 2016-07-22 DIAGNOSIS — M5416 Radiculopathy, lumbar region: Secondary | ICD-10-CM | POA: Diagnosis not present

## 2016-07-22 DIAGNOSIS — M545 Low back pain: Secondary | ICD-10-CM | POA: Diagnosis not present

## 2016-08-02 ENCOUNTER — Other Ambulatory Visit: Payer: Self-pay | Admitting: "Endocrinology

## 2016-08-08 ENCOUNTER — Other Ambulatory Visit: Payer: Self-pay

## 2016-08-08 MED ORDER — METFORMIN HCL 1000 MG PO TABS
1000.0000 mg | ORAL_TABLET | Freq: Two times a day (BID) | ORAL | 1 refills | Status: DC
Start: 2016-08-08 — End: 2018-06-25

## 2016-09-18 ENCOUNTER — Other Ambulatory Visit: Payer: Self-pay

## 2016-09-18 ENCOUNTER — Ambulatory Visit (INDEPENDENT_AMBULATORY_CARE_PROVIDER_SITE_OTHER): Payer: Medicare HMO | Admitting: Nurse Practitioner

## 2016-09-18 ENCOUNTER — Encounter: Payer: Self-pay | Admitting: Nurse Practitioner

## 2016-09-18 ENCOUNTER — Telehealth: Payer: Self-pay

## 2016-09-18 DIAGNOSIS — K5900 Constipation, unspecified: Secondary | ICD-10-CM

## 2016-09-18 DIAGNOSIS — R69 Illness, unspecified: Secondary | ICD-10-CM

## 2016-09-18 MED ORDER — PEG 3350-KCL-NA BICARB-NACL 420 G PO SOLR: 4000.0000 mL | ORAL | 0 refills | Status: DC

## 2016-09-18 NOTE — Patient Instructions (Addendum)
Xavier Caldwell  09/18/2016     @PREFPERIOPPHARMACY @   Your procedure is scheduled on  09/23/2016   Report to [facility] at 9:30AM.   Call this number if you have problems the morning of surgery:  [phone]   Remember:  Do not eat food or drink liquids after midnight.  Take these medicines the morning of surgery with A SIP OF WATER  Xanax, cymbalta, hydrocodone, lisinopril, lyrica, requip. Take 1/2 of your usual insulin dosage the night before your procedure. DO NOT take any medications for diabetes the morning of your procedure.   Do not wear jewelry, make-up or nail polish.  Do not wear lotions, powders, or perfumes, or deoderant.  Do not shave 48 hours prior to surgery.  Men may shave face and neck.  Do not bring valuables to the hospital.  [facility] is not responsible for any belongings or valuables.  Contacts, dentures or bridgework may not be worn into surgery.  Leave your suitcase in the car.  After surgery it may be brought to your room.  For patients admitted to the hospital, discharge time will be determined by your treatment team.  Patients discharged the day of surgery will not be allowed to drive home.   Name and phone number of your driver:   Family Special instructions:  Follow the diet and prep instructions given to you by Dr Roseanne Kaufman office.  Please read over the following fact sheets that you were given. Anesthesia Post-op Instructions and Care and Recovery After Surgery       Colonoscopy, Adult A colonoscopy is an exam to look at the entire large intestine. During the exam, a lubricated, bendable tube is inserted into the anus and then passed into the rectum, colon, and other parts of the large intestine. A colonoscopy is often done as a part of normal colorectal screening or in response to certain symptoms, such as anemia, persistent diarrhea, abdominal pain, and blood in the stool. The exam can help screen for and diagnose medical  problems, including:  Tumors.  Polyps.  Inflammation.  Areas of bleeding.  Tell a health care provider about:  Any allergies you have.  All medicines you are taking, including vitamins, herbs, eye drops, creams, and over-the-counter medicines.  Any problems you or family members have had with anesthetic medicines.  Any blood disorders you have.  Any surgeries you have had.  Any medical conditions you have.  Any problems you have had passing stool. What are the risks? Generally, this is a safe procedure. However, problems may occur, including:  Bleeding.  A tear in the intestine.  A reaction to medicines given during the exam.  Infection (rare).  What happens before the procedure? Eating and drinking restrictions Follow instructions from your health care provider about eating and drinking, which may include:  A few days before the procedure - follow a low-fiber diet. Avoid nuts, seeds, dried fruit, raw fruits, and vegetables.  1-3 days before the procedure - follow a clear liquid diet. Drink only clear liquids, such as clear broth or bouillon, black coffee or tea, clear juice, clear soft drinks or sports drinks, gelatin dessert, and popsicles. Avoid any liquids that contain red or purple dye.  On the day of the procedure - do not eat or drink anything during the 2 hours before the procedure, or within the time period that your health care provider recommends.  Bowel prep If you were prescribed  an oral bowel prep to clean out your colon:  Take it as told by your health care provider. Starting the day before your procedure, you will need to drink a large amount of medicated liquid. The liquid will cause you to have multiple loose stools until your stool is almost clear or light green.  If your skin or anus gets irritated from diarrhea, you may use these to relieve the irritation: ? Medicated wipes, such as adult wet wipes with aloe and vitamin E. ? A skin  soothing-product like petroleum jelly.  If you vomit while drinking the bowel prep, take a break for up to 60 minutes and then begin the bowel prep again. If vomiting continues and you cannot take the bowel prep without vomiting, call your health care provider.  General instructions  Ask your health care provider about changing or stopping your regular medicines. This is especially important if you are taking diabetes medicines or blood thinners.  Plan to have someone take you home from the hospital or clinic. What happens during the procedure?  An IV tube may be inserted into one of your veins.  You will be given medicine to help you relax (sedative).  To reduce your risk of infection: ? Your health care team will wash or sanitize their hands. ? Your anal area will be washed with soap.  You will be asked to lie on your side with your knees bent.  Your health care provider will lubricate a long, thin, flexible tube. The tube will have a camera and a light on the end.  The tube will be inserted into your anus.  The tube will be gently eased through your rectum and colon.  Air will be delivered into your colon to keep it open. You may feel some pressure or cramping.  The camera will be used to take images during the procedure.  A small tissue sample may be removed from your body to be examined under a microscope (biopsy). If any potential problems are found, the tissue will be sent to a lab for testing.  If small polyps are found, your health care provider may remove them and have them checked for cancer cells.  The tube that was inserted into your anus will be slowly removed. The procedure may vary among health care providers and hospitals. What happens after the procedure?  Your blood pressure, heart rate, breathing rate, and blood oxygen level will be monitored until the medicines you were given have worn off.  Do not drive for 24 hours after the exam.  You may have a  small amount of blood in your stool.  You may pass gas and have mild abdominal cramping or bloating due to the air that was used to inflate your colon during the exam.  It is up to you to get the results of your procedure. Ask your health care provider, or the department performing the procedure, when your results will be ready. This information is not intended to replace advice given to you by your health care provider. Make sure you discuss any questions you have with your health care provider. Document Released: 01/19/2000 Document Revised: 11/22/2015 Document Reviewed: 04/04/2015 Elsevier Interactive Patient Education  2018 [employer].  Colonoscopy, Adult, Care After This sheet gives you information about how to care for yourself after your procedure. Your health care provider may also give you more specific instructions. If you have problems or questions, contact your health care provider. What can I expect after the  procedure? After the procedure, it is common to have:  A small amount of blood in your stool for 24 hours after the procedure.  Some gas.  Mild abdominal cramping or bloating.  Follow these instructions at home: General instructions   For the first 24 hours after the procedure: ? Do not drive or use machinery. ? Do not sign important documents. ? Do not drink alcohol. ? Do your regular daily activities at a slower pace than normal. ? Eat soft, easy-to-digest foods. ? Rest often.  Take over-the-counter or prescription medicines only as told by your health care provider.  It is up to you to get the results of your procedure. Ask your health care provider, or the department performing the procedure, when your results will be ready. Relieving cramping and bloating  Try walking around when you have cramps or feel bloated.  Apply heat to your abdomen as told by your health care provider. Use a heat source that your health care provider recommends, such as a moist  heat pack or a heating pad. ? Place a towel between your skin and the heat source. ? Leave the heat on for 20-30 minutes. ? Remove the heat if your skin turns bright red. This is especially important if you are unable to feel pain, heat, or cold. You may have a greater risk of getting burned. Eating and drinking  Drink enough fluid to keep your urine clear or pale yellow.  Resume your normal diet as instructed by your health care provider. Avoid heavy or fried foods that are hard to digest.  Avoid drinking alcohol for as long as instructed by your health care provider. Contact a health care provider if:  You have blood in your stool 2-3 days after the procedure. Get help right away if:  You have more than a small spotting of blood in your stool.  You pass large blood clots in your stool.  Your abdomen is swollen.  You have nausea or vomiting.  You have a fever.  You have increasing abdominal pain that is not relieved with medicine. This information is not intended to replace advice given to you by your health care provider. Make sure you discuss any questions you have with your health care provider. Document Released: 09/05/2003 Document Revised: 10/16/2015 Document Reviewed: 04/04/2015 Elsevier Interactive Patient Education  2018 [employer] Anesthesia is a term that refers to techniques, procedures, and medicines that help a person stay safe and comfortable during a medical procedure. Monitored anesthesia care, or sedation, is one type of anesthesia. Your anesthesia specialist may recommend sedation if you will be having a procedure that does not require you to be unconscious, such as:  Cataract surgery.  A dental procedure.  A biopsy.  A colonoscopy.  During the procedure, you may receive a medicine to help you relax (sedative). There are three levels of sedation:  Mild sedation. At this level, you may feel awake and relaxed. You will be  able to follow directions.  Moderate sedation. At this level, you will be sleepy. You may not remember the procedure.  Deep sedation. At this level, you will be asleep. You will not remember the procedure.  The more medicine you are given, the deeper your level of sedation will be. Depending on how you respond to the procedure, the anesthesia specialist may change your level of sedation or the type of anesthesia to fit your needs. An anesthesia specialist will monitor you closely during the procedure.  Let your health care provider know about:  Any allergies you have.  All medicines you are taking, including vitamins, herbs, eye drops, creams, and over-the-counter medicines.  Any use of steroids (by mouth or as a cream).  Any problems you or family members have had with sedatives and anesthetic medicines.  Any blood disorders you have.  Any surgeries you have had.  Any medical conditions you have, such as sleep apnea.  Whether you are pregnant or may be pregnant.  Any use of cigarettes, alcohol, or street drugs. What are the risks? Generally, this is a safe procedure. However, problems may occur, including:  Getting too much medicine (oversedation).  Nausea.  Allergic reaction to medicines.  Trouble breathing. If this happens, a breathing tube may be used to help with breathing. It will be removed when you are awake and breathing on your own.  Heart trouble.  Lung trouble.  Before the procedure Staying hydrated Follow instructions from your health care provider about hydration, which may include:  Up to 2 hours before the procedure - you may continue to drink clear liquids, such as water, clear fruit juice, black coffee, and plain tea.  Eating and drinking restrictions Follow instructions from your health care provider about eating and drinking, which may include:  8 hours before the procedure - stop eating heavy meals or foods such as meat, fried foods, or fatty  foods.  6 hours before the procedure - stop eating light meals or foods, such as toast or cereal.  6 hours before the procedure - stop drinking milk or drinks that contain milk.  2 hours before the procedure - stop drinking clear liquids.  Medicines Ask your health care provider about:  Changing or stopping your regular medicines. This is especially important if you are taking diabetes medicines or blood thinners.  Taking medicines such as aspirin and ibuprofen. These medicines can thin your blood. Do not take these medicines before your procedure if your health care provider instructs you not to.  Tests and exams  You will have a physical exam.  You may have blood tests done to show: ? How well your kidneys and liver are working. ? How well your blood can clot.  General instructions  Plan to have someone take you home from the hospital or clinic.  If you will be going home right after the procedure, plan to have someone with you for 24 hours.  What happens during the procedure?  Your blood pressure, heart rate, breathing, level of pain and overall condition will be monitored.  An IV tube will be inserted into one of your veins.  Your anesthesia specialist will give you medicines as needed to keep you comfortable during the procedure. This may mean changing the level of sedation.  The procedure will be performed. After the procedure  Your blood pressure, heart rate, breathing rate, and blood oxygen level will be monitored until the medicines you were given have worn off.  Do not drive for 24 hours if you received a sedative.  You may: ? Feel sleepy, clumsy, or nauseous. ? Feel forgetful about what happened after the procedure. ? Have a sore throat if you had a breathing tube during the procedure. ? Vomit. This information is not intended to replace advice given to you by your health care provider. Make sure you discuss any questions you have with your health care  provider. Document Released: 10/17/2004 Document Revised: 06/30/2015 Document Reviewed: 05/14/2015 Elsevier Interactive Patient Education  2018 [employer] Monitored Anesthesia Care, Care After These instructions provide you with information about caring for yourself after your procedure. Your health care provider may also give you more specific instructions. Your treatment has been planned according to current medical practices, but problems sometimes occur. Call your health care provider if you have any problems or questions after your procedure. What can I expect after the procedure? After your procedure, it is common to:  Feel sleepy for several hours.  Feel clumsy and have poor balance for several hours.  Feel forgetful about what happened after the procedure.  Have poor judgment for several hours.  Feel nauseous or vomit.  Have a sore throat if you had a breathing tube during the procedure.  Follow these instructions at home: For at least 24 hours after the procedure:   Do not: ? Participate in activities in which you could fall or become injured. ? Drive. ? Use heavy machinery. ? Drink alcohol. ? Take sleeping pills or medicines that cause drowsiness. ? Make important decisions or sign legal documents. ? Take care of children on your own.  Rest. Eating and drinking  Follow the diet that is recommended by your health care provider.  If you vomit, drink water, juice, or soup when you can drink without vomiting.  Make sure you have little or no nausea before eating solid foods. General instructions  Have a responsible adult stay with you until you are awake and alert.  Take over-the-counter and prescription medicines only as told by your health care provider.  If you smoke, do not smoke without supervision.  Keep all follow-up visits as told by your health care provider. This is important. Contact a health care provider if:  You keep feeling nauseous or you  keep vomiting.  You feel light-headed.  You develop a rash.  You have a fever. Get help right away if:  You have trouble breathing. This information is not intended to replace advice given to you by your health care provider. Make sure you discuss any questions you have with your health care provider. Document Released: 05/14/2015 Document Revised: 09/13/2015 Document Reviewed: 05/14/2015 Elsevier Interactive Patient Education  [employer].

## 2016-09-18 NOTE — Progress Notes (Signed)
Primary Care Physician:  Redmond School, MD Primary Gastroenterologist:  Dr. Gala Romney  Chief Complaint  Patient presents with  . Colonoscopy    never had tcs; hx abd pain/constipation but not now    HPI:   Xavier Caldwell is a 66 y.o. male who presents to schedule colonoscopy. Initially attempted phone triage but this is aborted due to polypharmacy and likely need for propofol. Based on triage report patient on Xanax, Cymbalta, hydrocodone. No previous colonoscopy or endoscopy found in our system.  Today he states he has never had a colonoscopy before. About 1 month ago he was having hemorrhoid pain associated with constipation and using OTC medications but his hemorrhoids have improved. Constipation is somewhat better than what it was, but continues intermittently; this is new for him as of the past 1-1.5 years. Denies hematochezia, melena, abdominal pain, N/V, unintentional weight loss, fever, chills. Denies chest pain, dyspnea, dizziness, lightheadedness, syncope, near syncope. Denies any other upper or lower GI symptoms.   Has tried [employer] which did not help.  Past Medical History:  Diagnosis Date  . Anxiety   . Arthritis   . Back pain   . Diabetes mellitus without complication ([place])   . High cholesterol   . History of gout   . Hypertension   . Neuropathy   . 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 Sleep apnea    Stop Bang score of 4    Past Surgical History:  Procedure Laterality Date  . APPENDECTOMY    . CATARACT EXTRACTION W/PHACO Right 04/25/2014   Procedure: CATARACT EXTRACTION PHACO AND INTRAOCULAR LENS PLACEMENT (IOC);  Surgeon: Williams Che, MD;  Location: [facility];  Service: Ophthalmology;  Laterality: Right;  CDE:4.70  . CATARACT EXTRACTION W/PHACO Left 07/11/2014   Procedure: CATARACT EXTRACTION PHACO AND INTRAOCULAR LENS PLACEMENT LEFT EYE CDE=8.32;  Surgeon: Williams Che, MD;  Location: [facility];  Service: Ophthalmology;  Laterality:  Left;  . RETINAL DETACHMENT SURGERY      Current Outpatient Prescriptions  Medication Sig Dispense Refill  . ALPRAZolam (XANAX) 0.5 MG tablet Take 0.5 mg by mouth 2 (two) times daily as needed for anxiety.    Marland Kitchen aspirin EC 81 MG tablet Take 81 mg by mouth daily.    . B Complex-C (SUPER B COMPLEX PO) Take by mouth daily.     . DULoxetine (CYMBALTA) 60 MG capsule Take 60 mg by mouth daily.    . empagliflozin (JARDIANCE) 10 MG TABS tablet Take 10 mg by mouth daily.    . ergocalciferol (VITAMIN D2) 50000 units capsule Take 50,000 Units by mouth once a week.    Marland Kitchen HYDROcodone-acetaminophen (NORCO/VICODIN) 5-325 MG per tablet Take 1 tablet by mouth every 6 (six) hours as needed for moderate pain.    . Insulin Glargine (TOUJEO SOLOSTAR) 300 UNIT/ML SOPN Inject 30 Units into the skin at bedtime. 4.5 mL 3  . lidocaine (LIDODERM) 5 % Place 1 patch onto the skin as needed. Remove & Discard patch within 12 hours or as directed by MD     . lisinopril (PRINIVIL,ZESTRIL) 40 MG tablet Take 40 mg by mouth daily.    . metFORMIN (GLUCOPHAGE) 1000 MG tablet Take 1 tablet (1,000 mg total) by mouth 2 (two) times daily. 180 tablet 1  . naloxegol oxalate (MOVANTIK) 25 MG TABS tablet Take by mouth daily.    . pregabalin (LYRICA) 75 MG capsule Take 75 mg by mouth daily.  In the morning and 150 mg in the evening.    Marland Kitchen rOPINIRole (REQUIP) 1 MG tablet Take 1 mg by mouth 3 (three) times daily.    . simvastatin (ZOCOR) 20 MG tablet Take 20 mg by mouth daily.    Marland Kitchen UNIFINE PENTIPS 31G X 5 MM MISC USE TO INJECT INSULIN AT BEDTIME 100 each 5   No current facility-administered medications for this visit.     Allergies as of 09/18/2016  . (No Known Allergies)    Family History  Problem Relation Age of Onset  . Colon cancer Neg Hx     Social History   Social History  . Marital status: Married    Spouse name: N/A  . Number of children: N/A  . Years of education: N/A   Occupational History  . Not on file.    Social History Main Topics  . Smoking status: Former Smoker    Packs/day: 1.50    Years: 30.00    Types: Cigarettes    Quit date: 07/05/2012  . Smokeless tobacco: Never Used  . Alcohol use No  . Drug use: No  . Sexual activity: No   Other Topics Concern  . Not on file   Social History Narrative  . No narrative on file    Review of Systems: General: Negative for anorexia, weight loss, fever, chills, fatigue, weakness. Eyes: Negative for vision changes.  ENT: Negative for hoarseness, difficulty swallowing , nasal congestion. CV: Negative for chest pain, angina, palpitations, dyspnea on exertion, peripheral edema.  Respiratory: Negative for dyspnea at rest, dyspnea on exertion, cough, sputum, wheezing.  GI: See history of present illness. GU:  Negative for dysuria, hematuria, urinary incontinence, urinary frequency, nocturnal urination.  MS: Negative for joint pain, low back pain.  Derm: Negative for rash or itching.  Neuro: Negative for weakness, abnormal sensation, seizure, frequent headaches, memory loss, confusion.  Psych: Negative for anxiety, depression, suicidal ideation, hallucinations.  Endo: Negative for unusual weight change.  Heme: Negative for bruising or bleeding. Allergy: Negative for rash or hives.    Physical Exam: BP (!) 160/77   Pulse 90   Temp (!) 97.1 F (36.2 C) (Oral)   Ht 5\' 11"  (1.803 m)   Wt 216 lb 3.2 oz (98.1 kg)   BMI 30.15 kg/m  General:   Alert and oriented. Pleasant and cooperative. Well-nourished and well-developed.  Head:  Normocephalic and atraumatic. Eyes:  Wearing sunglasses today. Ears:  Normal auditory acuity. Cardiovascular:  S1, S2 present without murmurs appreciated. Extremities without clubbing or edema. Respiratory:  Clear to auscultation bilaterally. No wheezes, rales, or rhonchi. No distress.  Gastrointestinal:  +BS, rounded but soft, non-tender and non-distended. No HSM noted. No guarding or rebound. No masses  appreciated.  Rectal:  Deferred  Musculoskalatal:  Symmetrical without gross deformities. Ambulates with a cane. Neurologic:  Alert and oriented x4;  grossly normal neurologically. Psych:  Alert and cooperative. Normal mood and affect. Heme/Lymph/Immune: No excessive bruising noted.    09/18/2016 10:54 AM   Disclaimer: This note was dictated with voice recognition software. Similar sounding words can inadvertently be transcribed and may not be corrected upon review.

## 2016-09-18 NOTE — Assessment & Plan Note (Signed)
The patient started having constipation about 1-1-1/2 years ago. He did have a bout of worsening/significant constipation but this is resolved. He is not generally satisfied with his bowel regimen at this time. He is currently on Movantik 25 mg daily. We'll likely need further constipation management. He is on chronic pain medications and therapy directed towards [facility] would likely be most beneficial. Has previously tried and failed Linzess. We will proceed with scheduling of his overdue colonoscopy. Return for follow-up in 2 months to discuss constipation and see if his symptoms are improved after cleanout from colonoscopy bowel prep.  Proceed with TCS on propofol/MAC with Dr. Gala Romney in near future: the risks, benefits, and alternatives have been discussed with the patient in detail. The patient states understanding and desires to proceed.  The patient is currently on Xanax, Cymbalta, hydrocodone. No other anticoagulants, anxiolytics, chronic pain medications, or antidepressants. We will schedule the procedure on propofol/MAC to promote adequate sedation.

## 2016-09-18 NOTE — Assessment & Plan Note (Signed)
The patient is taking multiple medications which will necessitate the use of propofol/MAC for his procedure.

## 2016-09-18 NOTE — Progress Notes (Signed)
cc'ed to pcp °

## 2016-09-18 NOTE — Telephone Encounter (Signed)
Called and informed pt's wife of pre-op appt 09/20/16 at 2:15pm.

## 2016-09-18 NOTE — Patient Instructions (Signed)
1. Continue your current medications. 2. We will schedule your colonoscopy for you. 3. Return for follow-up in 2 months to further address any residual constipation symptoms. 4. Call us if you have any questions or concerns.

## 2016-09-20 ENCOUNTER — Encounter (HOSPITAL_COMMUNITY): Payer: Self-pay

## 2016-09-20 ENCOUNTER — Telehealth: Payer: Self-pay

## 2016-09-20 ENCOUNTER — Encounter (HOSPITAL_COMMUNITY)
Admission: RE | Admit: 2016-09-20 | Discharge: 2016-09-20 | Disposition: A | Discharge status: Home / Self Care | Payer: Medicare HMO | Source: Ambulatory Visit | Attending: Internal Medicine | Admitting: Internal Medicine

## 2016-09-20 DIAGNOSIS — D120 Benign neoplasm of cecum: Secondary | ICD-10-CM | POA: Diagnosis not present

## 2016-09-20 DIAGNOSIS — R194 Change in bowel habit: Secondary | ICD-10-CM | POA: Diagnosis present

## 2016-09-20 DIAGNOSIS — G4730 Sleep apnea, unspecified: Secondary | ICD-10-CM | POA: Diagnosis not present

## 2016-09-20 DIAGNOSIS — E7800 Pure hypercholesterolemia, unspecified: Secondary | ICD-10-CM | POA: Diagnosis not present

## 2016-09-20 DIAGNOSIS — F419 Anxiety disorder, unspecified: Secondary | ICD-10-CM | POA: Diagnosis not present

## 2016-09-20 DIAGNOSIS — Z87891 Personal history of nicotine dependence: Secondary | ICD-10-CM | POA: Diagnosis not present

## 2016-09-20 DIAGNOSIS — Z79899 Other long term (current) drug therapy: Secondary | ICD-10-CM | POA: Diagnosis not present

## 2016-09-20 DIAGNOSIS — Z7982 Long term (current) use of aspirin: Secondary | ICD-10-CM | POA: Diagnosis not present

## 2016-09-20 DIAGNOSIS — K621 Rectal polyp: Secondary | ICD-10-CM | POA: Diagnosis not present

## 2016-09-20 DIAGNOSIS — K635 Polyp of colon: Secondary | ICD-10-CM | POA: Diagnosis not present

## 2016-09-20 DIAGNOSIS — M109 Gout, unspecified: Secondary | ICD-10-CM | POA: Diagnosis not present

## 2016-09-20 DIAGNOSIS — E119 Type 2 diabetes mellitus without complications: Secondary | ICD-10-CM | POA: Diagnosis not present

## 2016-09-20 DIAGNOSIS — Z794 Long term (current) use of insulin: Secondary | ICD-10-CM | POA: Diagnosis not present

## 2016-09-20 DIAGNOSIS — R69 Illness, unspecified: Secondary | ICD-10-CM | POA: Diagnosis not present

## 2016-09-20 DIAGNOSIS — I10 Essential (primary) hypertension: Secondary | ICD-10-CM | POA: Diagnosis not present

## 2016-09-20 LAB — CBC WITH DIFFERENTIAL/PLATELET
BASOS ABS: 0.1 10*3/uL (ref 0.0–0.1)
Basophils Relative: 0 %
EOS PCT: 1 %
Eosinophils Absolute: 0.1 10*3/uL (ref 0.0–0.7)
HEMATOCRIT: 43.3 % (ref 39.0–52.0)
Hemoglobin: 14.9 g/dL (ref 13.0–17.0)
LYMPHS ABS: 3.1 10*3/uL (ref 0.7–4.0)
LYMPHS PCT: 28 %
MCH: 30.3 pg (ref 26.0–34.0)
MCHC: 34.4 g/dL (ref 30.0–36.0)
MCV: 88.2 fL (ref 78.0–100.0)
Monocytes Absolute: 0.7 10*3/uL (ref 0.1–1.0)
Monocytes Relative: 7 %
Neutro Abs: 7.1 10*3/uL (ref 1.7–7.7)
Neutrophils Relative %: 64 %
PLATELETS: 221 10*3/uL (ref 150–400)
RBC: 4.91 MIL/uL (ref 4.22–5.81)
RDW: 13.4 % (ref 11.5–15.5)
WBC: 11.1 10*3/uL — ABNORMAL HIGH (ref 4.0–10.5)

## 2016-09-20 LAB — BASIC METABOLIC PANEL
Anion gap: 10 (ref 5–15)
BUN: 14 mg/dL (ref 6–20)
CHLORIDE: 106 mmol/L (ref 101–111)
CO2: 24 mmol/L (ref 22–32)
Calcium: 9.1 mg/dL (ref 8.9–10.3)
Creatinine, Ser: 0.94 mg/dL (ref 0.61–1.24)
GFR calc Af Amer: >60 mL/min (ref >60)
GLUCOSE: 209 mg/dL — AB (ref 65–99)
POTASSIUM: 4.2 mmol/L (ref 3.5–5.1)
Sodium: 140 mmol/L (ref 135–145)

## 2016-09-20 NOTE — Telephone Encounter (Signed)
Pt is aware to be at the hospital Monday morning at 9:45 for an 11:00 am TCS. He is also aware to drink the second half of prep at 6:00 am and nothing at 8:00 am.

## 2016-09-23 ENCOUNTER — Ambulatory Visit (HOSPITAL_COMMUNITY): Payer: Medicare HMO | Admitting: Anesthesiology

## 2016-09-23 ENCOUNTER — Ambulatory Visit (HOSPITAL_COMMUNITY)
Admission: RE | Admit: 2016-09-23 | Discharge: 2016-09-23 | Disposition: A | Discharge status: Home / Self Care | Payer: Medicare HMO | Source: Ambulatory Visit | Attending: Internal Medicine | Admitting: Internal Medicine

## 2016-09-23 ENCOUNTER — Encounter (HOSPITAL_COMMUNITY): Payer: Self-pay | Admitting: *Deleted

## 2016-09-23 ENCOUNTER — Encounter (HOSPITAL_COMMUNITY)
Admission: RE | Disposition: A | Discharge status: Home / Self Care | Payer: Self-pay | Source: Ambulatory Visit | Attending: Internal Medicine

## 2016-09-23 DIAGNOSIS — F419 Anxiety disorder, unspecified: Secondary | ICD-10-CM | POA: Insufficient documentation

## 2016-09-23 DIAGNOSIS — K5900 Constipation, unspecified: Secondary | ICD-10-CM | POA: Diagnosis not present

## 2016-09-23 DIAGNOSIS — K621 Rectal polyp: Secondary | ICD-10-CM | POA: Insufficient documentation

## 2016-09-23 DIAGNOSIS — E7800 Pure hypercholesterolemia, unspecified: Secondary | ICD-10-CM | POA: Diagnosis not present

## 2016-09-23 DIAGNOSIS — Z794 Long term (current) use of insulin: Secondary | ICD-10-CM | POA: Insufficient documentation

## 2016-09-23 DIAGNOSIS — Z87891 Personal history of nicotine dependence: Secondary | ICD-10-CM | POA: Insufficient documentation

## 2016-09-23 DIAGNOSIS — G4730 Sleep apnea, unspecified: Secondary | ICD-10-CM | POA: Diagnosis not present

## 2016-09-23 DIAGNOSIS — K635 Polyp of colon: Secondary | ICD-10-CM | POA: Insufficient documentation

## 2016-09-23 DIAGNOSIS — R194 Change in bowel habit: Secondary | ICD-10-CM | POA: Diagnosis not present

## 2016-09-23 DIAGNOSIS — I10 Essential (primary) hypertension: Secondary | ICD-10-CM | POA: Insufficient documentation

## 2016-09-23 DIAGNOSIS — R69 Illness, unspecified: Secondary | ICD-10-CM | POA: Diagnosis not present

## 2016-09-23 DIAGNOSIS — E119 Type 2 diabetes mellitus without complications: Secondary | ICD-10-CM | POA: Diagnosis not present

## 2016-09-23 DIAGNOSIS — D120 Benign neoplasm of cecum: Secondary | ICD-10-CM | POA: Diagnosis not present

## 2016-09-23 DIAGNOSIS — Z7982 Long term (current) use of aspirin: Secondary | ICD-10-CM | POA: Insufficient documentation

## 2016-09-23 DIAGNOSIS — M109 Gout, unspecified: Secondary | ICD-10-CM | POA: Insufficient documentation

## 2016-09-23 DIAGNOSIS — D124 Benign neoplasm of descending colon: Secondary | ICD-10-CM | POA: Diagnosis not present

## 2016-09-23 DIAGNOSIS — D122 Benign neoplasm of ascending colon: Secondary | ICD-10-CM | POA: Diagnosis not present

## 2016-09-23 DIAGNOSIS — Z79899 Other long term (current) drug therapy: Secondary | ICD-10-CM | POA: Insufficient documentation

## 2016-09-23 HISTORY — PX: COLONOSCOPY WITH PROPOFOL: SHX5780

## 2016-09-23 HISTORY — PX: POLYPECTOMY: SHX5525

## 2016-09-23 LAB — GLUCOSE, CAPILLARY
GLUCOSE-CAPILLARY: 159 mg/dL — AB (ref 65–99)
Glucose-Capillary: 122 mg/dL — ABNORMAL HIGH (ref 65–99)

## 2016-09-23 SURGERY — COLONOSCOPY WITH PROPOFOL
Anesthesia: Monitor Anesthesia Care

## 2016-09-23 MED ORDER — CHLORHEXIDINE GLUCONATE CLOTH 2 % EX PADS: 6.0000 | MEDICATED_PAD | Freq: Once | CUTANEOUS | Status: DC

## 2016-09-23 MED ORDER — PROPOFOL 10 MG/ML IV BOLUS: INTRAVENOUS | Status: DC | PRN

## 2016-09-23 MED ORDER — MIDAZOLAM HCL 2 MG/2ML IJ SOLN
1.0000 mg | Freq: Once | INTRAMUSCULAR | Status: AC | PRN
  Administered 2016-09-23: 2 mg via INTRAVENOUS
  Filled 2016-09-23: qty 2

## 2016-09-23 MED ORDER — PROPOFOL 10 MG/ML IV BOLUS
INTRAVENOUS | Status: AC
  Filled 2016-09-23: qty 60

## 2016-09-23 MED ORDER — PROPOFOL 500 MG/50ML IV EMUL
INTRAVENOUS | Status: DC | PRN
  Administered 2016-09-23: 150 ug/kg/min via INTRAVENOUS
  Administered 2016-09-23 (×2): via INTRAVENOUS

## 2016-09-23 MED ORDER — LACTATED RINGERS IV SOLN
INTRAVENOUS | Status: DC
Start: 2016-09-23 — End: 2016-09-23
  Administered 2016-09-23 (×2): via INTRAVENOUS

## 2016-09-23 NOTE — Transfer of Care (Signed)
Immediate Anesthesia Transfer of Care Note  Patient: Xavier Caldwell  Procedure(s) Performed: Procedure(s) with comments: COLONOSCOPY WITH PROPOFOL (N/A) POLYPECTOMY - colon  Patient Location: PACU  Anesthesia Type:MAC  Level of Consciousness: awake, alert , oriented and patient cooperative  Airway & Oxygen Therapy: Patient Spontanous Breathing  Post-op Assessment: Report given to RN and Post -op Vital signs reviewed and stable  Post vital signs: Reviewed and stable  Last Vitals:  Vitals:   09/23/16 0915 09/23/16 0920  BP: (!) 108/54 96/63  Pulse:    Resp: (P) 10 (!) 0  Temp:    SpO2: 95% 93%    Last Pain:  Vitals:   09/23/16 0853  TempSrc: Oral      Patients Stated Pain Goal: 7 (50/93/26 7124)  Complications: No apparent anesthesia complications

## 2016-09-23 NOTE — H&P (View-Only) (Signed)
Primary Care Physician:  Redmond School, MD Primary Gastroenterologist:  Dr. Gala Romney  Chief Complaint  Patient presents with  . Colonoscopy    never had tcs; hx abd pain/constipation but not now    HPI:   Xavier Caldwell is a 66 y.o. male who presents to schedule colonoscopy. Initially attempted phone triage but this is aborted due to polypharmacy and likely need for propofol. Based on triage report patient on Xanax, Cymbalta, hydrocodone. No previous colonoscopy or endoscopy found in our system.  Today he states he has never had a colonoscopy before. About 1 month ago he was having hemorrhoid pain associated with constipation and using OTC medications but his hemorrhoids have improved. Constipation is somewhat better than what it was, but continues intermittently; this is new for him as of the past 1-1.5 years. Denies hematochezia, melena, abdominal pain, N/V, unintentional weight loss, fever, chills. Denies chest pain, dyspnea, dizziness, lightheadedness, syncope, near syncope. Denies any other upper or lower GI symptoms.   Has tried [employer] which did not help.  Past Medical History:  Diagnosis Date  . Anxiety   . Arthritis   . Back pain   . Diabetes mellitus without complication ([place])   . High cholesterol   . History of gout   . Hypertension   . Neuropathy   . 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 Sleep apnea    Stop Bang score of 4    Past Surgical History:  Procedure Laterality Date  . APPENDECTOMY    . CATARACT EXTRACTION W/PHACO Right 04/25/2014   Procedure: CATARACT EXTRACTION PHACO AND INTRAOCULAR LENS PLACEMENT (IOC);  Surgeon: Williams Che, MD;  Location: [facility];  Service: Ophthalmology;  Laterality: Right;  CDE:4.70  . CATARACT EXTRACTION W/PHACO Left 07/11/2014   Procedure: CATARACT EXTRACTION PHACO AND INTRAOCULAR LENS PLACEMENT LEFT EYE CDE=8.32;  Surgeon: Williams Che, MD;  Location: [facility];  Service: Ophthalmology;  Laterality:  Left;  . RETINAL DETACHMENT SURGERY      Current Outpatient Prescriptions  Medication Sig Dispense Refill  . ALPRAZolam (XANAX) 0.5 MG tablet Take 0.5 mg by mouth 2 (two) times daily as needed for anxiety.    Marland Kitchen aspirin EC 81 MG tablet Take 81 mg by mouth daily.    . B Complex-C (SUPER B COMPLEX PO) Take by mouth daily.     . DULoxetine (CYMBALTA) 60 MG capsule Take 60 mg by mouth daily.    . empagliflozin (JARDIANCE) 10 MG TABS tablet Take 10 mg by mouth daily.    . ergocalciferol (VITAMIN D2) 50000 units capsule Take 50,000 Units by mouth once a week.    Marland Kitchen HYDROcodone-acetaminophen (NORCO/VICODIN) 5-325 MG per tablet Take 1 tablet by mouth every 6 (six) hours as needed for moderate pain.    . Insulin Glargine (TOUJEO SOLOSTAR) 300 UNIT/ML SOPN Inject 30 Units into the skin at bedtime. 4.5 mL 3  . lidocaine (LIDODERM) 5 % Place 1 patch onto the skin as needed. Remove & Discard patch within 12 hours or as directed by MD     . lisinopril (PRINIVIL,ZESTRIL) 40 MG tablet Take 40 mg by mouth daily.    . metFORMIN (GLUCOPHAGE) 1000 MG tablet Take 1 tablet (1,000 mg total) by mouth 2 (two) times daily. 180 tablet 1  . naloxegol oxalate (MOVANTIK) 25 MG TABS tablet Take by mouth daily.    . pregabalin (LYRICA) 75 MG capsule Take 75 mg by mouth daily.  In the morning and 150 mg in the evening.    Marland Kitchen rOPINIRole (REQUIP) 1 MG tablet Take 1 mg by mouth 3 (three) times daily.    . simvastatin (ZOCOR) 20 MG tablet Take 20 mg by mouth daily.    Marland Kitchen UNIFINE PENTIPS 31G X 5 MM MISC USE TO INJECT INSULIN AT BEDTIME 100 each 5   No current facility-administered medications for this visit.     Allergies as of 09/18/2016  . (No Known Allergies)    Family History  Problem Relation Age of Onset  . Colon cancer Neg Hx     Social History   Social History  . Marital status: Married    Spouse name: N/A  . Number of children: N/A  . Years of education: N/A   Occupational History  . Not on file.    Social History Main Topics  . Smoking status: Former Smoker    Packs/day: 1.50    Years: 30.00    Types: Cigarettes    Quit date: 07/05/2012  . Smokeless tobacco: Never Used  . Alcohol use No  . Drug use: No  . Sexual activity: No   Other Topics Concern  . Not on file   Social History Narrative  . No narrative on file    Review of Systems: General: Negative for anorexia, weight loss, fever, chills, fatigue, weakness. Eyes: Negative for vision changes.  ENT: Negative for hoarseness, difficulty swallowing , nasal congestion. CV: Negative for chest pain, angina, palpitations, dyspnea on exertion, peripheral edema.  Respiratory: Negative for dyspnea at rest, dyspnea on exertion, cough, sputum, wheezing.  GI: See history of present illness. GU:  Negative for dysuria, hematuria, urinary incontinence, urinary frequency, nocturnal urination.  MS: Negative for joint pain, low back pain.  Derm: Negative for rash or itching.  Neuro: Negative for weakness, abnormal sensation, seizure, frequent headaches, memory loss, confusion.  Psych: Negative for anxiety, depression, suicidal ideation, hallucinations.  Endo: Negative for unusual weight change.  Heme: Negative for bruising or bleeding. Allergy: Negative for rash or hives.    Physical Exam: BP (!) 160/77   Pulse 90   Temp (!) 97.1 F (36.2 C) (Oral)   Ht 5\' 11"  (1.803 m)   Wt 216 lb 3.2 oz (98.1 kg)   BMI 30.15 kg/m  General:   Alert and oriented. Pleasant and cooperative. Well-nourished and well-developed.  Head:  Normocephalic and atraumatic. Eyes:  Wearing sunglasses today. Ears:  Normal auditory acuity. Cardiovascular:  S1, S2 present without murmurs appreciated. Extremities without clubbing or edema. Respiratory:  Clear to auscultation bilaterally. No wheezes, rales, or rhonchi. No distress.  Gastrointestinal:  +BS, rounded but soft, non-tender and non-distended. No HSM noted. No guarding or rebound. No masses  appreciated.  Rectal:  Deferred  Musculoskalatal:  Symmetrical without gross deformities. Ambulates with a cane. Neurologic:  Alert and oriented x4;  grossly normal neurologically. Psych:  Alert and cooperative. Normal mood and affect. Heme/Lymph/Immune: No excessive bruising noted.    09/18/2016 10:54 AM   Disclaimer: This note was dictated with voice recognition software. Similar sounding words can inadvertently be transcribed and may not be corrected upon review.

## 2016-09-23 NOTE — Interval H&P Note (Signed)
History and Physical Interval Note:  09/23/2016 9:35 AM  Xavier Caldwell  has presented today for surgery, with the diagnosis of CONSTPATION  The various methods of treatment have been discussed with the patient and family. After consideration of risks, benefits and other options for treatment, the patient has consented to  Procedure(s): COLONOSCOPY WITH PROPOFOL (N/A) as a surgical intervention .  The patient's history has been reviewed, patient examined, no change in status, stable for surgery.  I have reviewed the patient's chart and labs.  Questions were answered to the patient's satisfaction.    No change;  first ever colonoscopy.  Constipation.  The risks, benefits, limitations, alternatives and imponderables have been reviewed with the patient. Questions have been answered. All parties are agreeable.  Manus Rudd

## 2016-09-23 NOTE — Discharge Instructions (Signed)
Colon Polyps °Polyps are tissue growths inside the body. Polyps can grow in many places, including the large intestine (colon). A polyp may be a round bump or a mushroom-shaped growth. You could have one polyp or several. °Most colon polyps are noncancerous (benign). However, some colon polyps can become cancerous over time. °What are the causes? °The exact cause of colon polyps is not known. °What increases the risk? °This condition is more likely to develop in people who: °· Have a family history of colon cancer or colon polyps. °· Are older than 50 or older than 45 if they are African American. °· Have inflammatory bowel disease, such as ulcerative colitis or Crohn disease. °· Are overweight. °· Smoke cigarettes. °· Do not get enough exercise. °· Drink too much alcohol. °· Eat a diet that is: °? High in fat and red meat. °? Low in fiber. °· Had childhood cancer that was treated with abdominal radiation. ° °What are the signs or symptoms? °Most polyps do not cause symptoms. If you have symptoms, they may include: °· Blood coming from your rectum when having a bowel movement. °· Blood in your stool. The stool may look dark red or black. °· A change in bowel habits, such as constipation or diarrhea. ° °How is this diagnosed? °This condition is diagnosed with a colonoscopy. This is a procedure that uses a lighted, flexible scope to look at the inside of your colon. °How is this treated? °Treatment for this condition involves removing any polyps that are found. Those polyps will then be tested for cancer. If cancer is found, your health care provider will talk to you about options for colon cancer treatment. °Follow these instructions at home: °Diet °· Eat plenty of fiber, such as fruits, vegetables, and whole grains. °· Eat foods that are high in calcium and vitamin D, such as milk, cheese, yogurt, eggs, liver, fish, and broccoli. °· Limit foods high in fat, red meats, and processed meats, such as hot dogs, sausage,  bacon, and lunch meats. °· Maintain a healthy weight, or lose weight if recommended by your health care provider. °General instructions °· Do not smoke cigarettes. °· Do not drink alcohol excessively. °· Keep all follow-up visits as told by your health care provider. This is important. This includes keeping regularly scheduled colonoscopies. Talk to your health care provider about when you need a colonoscopy. °· Exercise every day or as told by your health care provider. °Contact a health care provider if: °· You have new or worsening bleeding during a bowel movement. °· You have new or increased blood in your stool. °· You have a change in bowel habits. °· You unexpectedly lose weight. °This information is not intended to replace advice given to you by your health care provider. Make sure you discuss any questions you have with your health care provider. °Document Released: 10/18/2003 Document Revised: 06/29/2015 Document Reviewed: 12/12/2014 °Elsevier Interactive Patient Education © 2018 [employer] ° °Colonoscopy °Discharge Instructions ° °Read the instructions outlined below and refer to this sheet in the next few weeks. These discharge instructions provide you with general information on caring for yourself after you leave the hospital. Your doctor may also give you specific instructions. While your treatment has been planned according to the most current medical practices available, unavoidable complications occasionally occur. If you have any problems or questions after discharge, call Dr. Rourk at [phone]. °ACTIVITY °· You may resume your regular activity, but move at a slower pace for the next 24   hours.   Take frequent rest periods for the next 24 hours.   Walking will help get rid of the air and reduce the bloated feeling in your belly (abdomen).   No driving for 24 hours (because of the medicine (anesthesia) used during the test).    Do not sign any important legal documents or operate any  machinery for 24 hours (because of the anesthesia used during the test).  NUTRITION  Drink plenty of fluids.   You may resume your normal diet as instructed by your doctor.   Begin with a light meal and progress to your normal diet. Heavy or fried foods are harder to digest and may make you feel sick to your stomach (nauseated).   Avoid alcoholic beverages for 24 hours or as instructed.  MEDICATIONS  You may resume your normal medications unless your doctor tells you otherwise.  WHAT YOU CAN EXPECT TODAY  Some feelings of bloating in the abdomen.   Passage of more gas than usual.   Spotting of blood in your stool or on the toilet paper.  IF YOU HAD POLYPS REMOVED DURING THE COLONOSCOPY:  No aspirin products for 7 days or as instructed.   No alcohol for 7 days or as instructed.   Eat a soft diet for the next 24 hours.  FINDING OUT THE RESULTS OF YOUR TEST Not all test results are available during your visit. If your test results are not back during the visit, make an appointment with your caregiver to find out the results. Do not assume everything is normal if you have not heard from your caregiver or the medical facility. It is important for you to follow up on all of your test results.  SEEK IMMEDIATE MEDICAL ATTENTION IF:  You have more than a spotting of blood in your stool.   Your belly is swollen (abdominal distention).   You are nauseated or vomiting.   You have a temperature over 101.   You have abdominal pain or discomfort that is severe or gets worse throughout the day.    Multiple colonic polyps removed today  Do not take aspirin,  Advil or Aleve, etc. for the next 2 weeks  May use Tylenol or nonaspirin products for pain  Further recommendations to follow pending review of pathology report

## 2016-09-23 NOTE — Op Note (Signed)
[facility] Patient Name: Xavier Caldwell Procedure Date: 09/23/2016 9:38 AM MRN: [id number] Date of Birth: [birth year] Attending MD: Norvel Richards , MD CSN: [id number] Age: 66 Admit Type: Outpatient Procedure:                Colonoscopy Indications:              Change in bowel habits Providers:                Norvel Richards, MD, [name]. Sharon Seller, RN,                            Lurline Del, RN Referring MD:             Redmond School, MD Medicines:                Propofol per Anesthesia Complications:            No immediate complications. Estimated Blood Loss:     Estimated blood loss was minimal. Procedure:                Pre-Anesthesia Assessment:                           - Prior to the procedure, a History and Physical                            was performed, and patient medications and                            allergies were reviewed. The patient's tolerance of                            previous anesthesia was also reviewed. The risks                            and benefits of the procedure and the sedation                            options and risks were discussed with the patient.                            All questions were answered, and informed consent                            was obtained. Prior Anticoagulants: The patient has                            taken no previous anticoagulant or antiplatelet                            agents. ASA Grade Assessment: II - A patient with                            mild systemic disease. After reviewing the risks  and benefits, the patient was deemed in                            satisfactory condition to undergo the procedure.                           After obtaining informed consent, the colonoscope                            was passed under direct vision. Throughout the                            procedure, the patient's blood pressure, pulse, and                            oxygen  saturations were monitored continuously. The                            EC-3890Li ([id number]) scope was introduced through                            the and advanced to the the cecum, identified by                            appendiceal orifice and ileocecal valve. The                            ileocecal valve, appendiceal orifice, and rectum                            were photographed. The entire colon was well                            visualized. The quality of the bowel preparation                            was adequate. The EC-3890Li ([id number]) scope was                            introduced through the and advanced to the. Scope In: 71:24:58 AM Scope Out: 09:98:33 AM Scope Withdrawal Time: 0 hours 37 minutes 9 seconds  Total Procedure Duration: 0 hours 40 minutes 4 seconds  Findings:      The perianal and digital rectal examinations were normal.      Four semi-pedunculated polyps were found in the descending colon,       ascending colon and ileocecal valve. The polyps were 8 to 20 mm in size.       These polyps were removed with a hot snare. Resection and retrieval were       complete. Estimated blood loss: none. the 2 largest polyps- 16 and 20 mm       located just distal to the ileocecal valve and ascending segment,       respectively      Five semi-pedunculated polyps were found in the rectum, sigmoid colon  and cecum. The polyps were 4 to 6 mm in size. These polyps were removed       with a cold snare. Resection and retrieval were complete. Estimated       blood loss was minimal.      The exam was otherwise without abnormality on direct and retroflexion       views. Impression:               - Four 8 to 20 mm polyps in the descending colon,                            in the ascending colon and at the ileocecal valve,                            removed with a hot snare. Resected and retrieved.                           - Five 4 to 6 mm polyps in the rectum, in the                             sigmoid colon and in the cecum, removed with a cold                            snare. Resected and retrieved.                           - The examination was otherwise normal on direct                            and retroflexion views. Moderate Sedation:      Moderate (conscious) sedation was personally administered by an       anesthesia professional. The following parameters were monitored: oxygen       saturation, heart rate, blood pressure, respiratory rate, EKG, adequacy       of pulmonary ventilation, and response to care. Total physician       intraservice time was 47 minutes. Recommendation:           - Patient has a contact number available for                            emergencies. The signs and symptoms of potential                            delayed complications were discussed with the                            patient. Return to normal activities tomorrow.                            Written discharge instructions were provided to the                            patient.                           -  Resume previous diet.                           - Continue present medications. avoid aspirin and                            NSAIDs ?14 days                           - Repeat colonoscopy date to be determined after                            pending pathology results are reviewed for                            surveillance based on pathology results.                           - Return to GI clinic in 6 weeks. Procedure Code(s):        --- Professional ---                           [id number], Colonoscopy, flexible; with removal of                            tumor(s), polyp(s), or other lesion(s) by snare                            technique Diagnosis Code(s):        --- Professional ---                           D12.4, Benign neoplasm of descending colon                           D12.2, Benign neoplasm of ascending colon                           K62.1, Rectal  polyp                           D12.5, Benign neoplasm of sigmoid colon                           D12.0, Benign neoplasm of cecum                           R19.4, Change in bowel habit CPT copyright 2016 American Medical Association. All rights reserved. The codes documented in this report are preliminary and upon coder review may  be revised to meet current compliance requirements. Cristopher Estimable. Dam Ashraf, MD Norvel Richards, MD 09/23/2016 11:05:42 AM This report has been signed electronically. Number of Addenda: 0

## 2016-09-23 NOTE — Anesthesia Procedure Notes (Signed)
Procedure Name: MAC Date/Time: 09/23/2016 9:55 AM Performed by: Andree Elk, Adler Alton A Pre-anesthesia Checklist: Patient identified, Suction available, Emergency Drugs available, Patient being monitored and Timeout performed Oxygen Delivery Method: Simple face mask

## 2016-09-23 NOTE — Anesthesia Postprocedure Evaluation (Signed)
Anesthesia Post Note  Patient: Xavier Caldwell  Procedure(s) Performed: Procedure(s) (LRB): COLONOSCOPY WITH PROPOFOL (N/A) POLYPECTOMY  Patient location during evaluation: PACU Anesthesia Type: MAC Level of consciousness: awake and alert, oriented and patient cooperative Pain management: pain level controlled Vital Signs Assessment: post-procedure vital signs reviewed and stable Respiratory status: spontaneous breathing Cardiovascular status: stable Postop Assessment: no signs of nausea or vomiting Anesthetic complications: no     Last Vitals:  Vitals:   09/23/16 0915 09/23/16 0920  BP: (!) 108/54 96/63  Pulse:    Resp: (P) 10 (!) 0  Temp:    SpO2: 95% 93%    Last Pain:  Vitals:   09/23/16 0853  TempSrc: Oral                 Alicha Raspberry A

## 2016-09-23 NOTE — Anesthesia Preprocedure Evaluation (Addendum)
Anesthesia Evaluation    Airway Mallampati: III  TM Distance: >3 FB Neck ROM: Full    Dental  (+) Teeth Intact   Pulmonary sleep apnea , former smoker,   Slightly barrel chested Pulmonary exam normal        Cardiovascular hypertension, Pt. on medications  Rhythm:Regular Rate:Normal     Neuro/Psych Anxiety negative psych ROS   GI/Hepatic   Endo/Other  diabetes, Poorly Controlled, Type 2, Insulin Dependent  Renal/GU      Musculoskeletal   Abdominal (+) + obese,   Peds  Hematology   Anesthesia Other Findings   Reproductive/Obstetrics                             Anesthesia Physical Anesthesia Plan  ASA: III  Anesthesia Plan: MAC   Post-op Pain Management:    Induction: Intravenous  PONV Risk Score and Plan:   Airway Management Planned: Mask and Natural Airway  Additional Equipment:   Intra-op Plan:   Post-operative Plan:   Informed Consent: I have reviewed the patients History and Physical, chart, labs and discussed the procedure including the risks, benefits and alternatives for the proposed anesthesia with the patient or authorized representative who has indicated his/her understanding and acceptance.     Plan Discussed with: CRNA  Anesthesia Plan Comments:         Anesthesia Quick Evaluation

## 2016-09-25 ENCOUNTER — Encounter (HOSPITAL_COMMUNITY): Payer: Self-pay | Admitting: Internal Medicine

## 2016-09-29 ENCOUNTER — Encounter: Payer: Self-pay | Admitting: Internal Medicine

## 2016-10-02 DIAGNOSIS — E113553 Type 2 diabetes mellitus with stable proliferative diabetic retinopathy, bilateral: Secondary | ICD-10-CM | POA: Diagnosis not present

## 2016-10-02 DIAGNOSIS — H401121 Primary open-angle glaucoma, left eye, mild stage: Secondary | ICD-10-CM | POA: Diagnosis not present

## 2016-10-02 DIAGNOSIS — T380X5A Adverse effect of glucocorticoids and synthetic analogues, initial encounter: Secondary | ICD-10-CM | POA: Diagnosis not present

## 2016-10-02 DIAGNOSIS — T380X5S Adverse effect of glucocorticoids and synthetic analogues, sequela: Secondary | ICD-10-CM | POA: Diagnosis not present

## 2016-10-03 ENCOUNTER — Other Ambulatory Visit: Payer: Self-pay | Admitting: "Endocrinology

## 2016-10-03 DIAGNOSIS — E118 Type 2 diabetes mellitus with unspecified complications: Secondary | ICD-10-CM | POA: Diagnosis not present

## 2016-10-03 DIAGNOSIS — E1165 Type 2 diabetes mellitus with hyperglycemia: Secondary | ICD-10-CM | POA: Diagnosis not present

## 2016-10-03 DIAGNOSIS — Z794 Long term (current) use of insulin: Secondary | ICD-10-CM | POA: Diagnosis not present

## 2016-10-04 LAB — HGB A1C W/O EAG: Hgb A1c MFr Bld: 7.4 % — ABNORMAL HIGH (ref 4.8–5.6)

## 2016-10-04 LAB — RENAL FUNCTION PANEL
Albumin: 4.2 g/dL (ref 3.6–4.8)
BUN/Creatinine Ratio: 18 (ref 10–24)
BUN: 13 mg/dL (ref 8–27)
CO2: 20 mmol/L (ref 20–29)
CREATININE: 0.72 mg/dL — AB (ref 0.76–1.27)
Calcium: 9.2 mg/dL (ref 8.6–10.2)
Chloride: 101 mmol/L (ref 96–106)
GFR calc Af Amer: 114 mL/min/{1.73_m2} (ref >59)
GFR calc non Af Amer: 99 mL/min/{1.73_m2} (ref >59)
Glucose: 146 mg/dL — ABNORMAL HIGH (ref 65–99)
PHOSPHORUS: 2.9 mg/dL (ref 2.5–4.5)
Potassium: 4.8 mmol/L (ref 3.5–5.2)
SODIUM: 138 mmol/L (ref 134–144)

## 2016-10-10 ENCOUNTER — Encounter: Payer: Self-pay | Admitting: "Endocrinology

## 2016-10-10 ENCOUNTER — Ambulatory Visit (INDEPENDENT_AMBULATORY_CARE_PROVIDER_SITE_OTHER): Payer: Medicare HMO | Admitting: "Endocrinology

## 2016-10-10 VITALS — BP 134/85 | HR 76 | Ht 71.0 in | Wt 214.0 lb

## 2016-10-10 DIAGNOSIS — E118 Type 2 diabetes mellitus with unspecified complications: Secondary | ICD-10-CM

## 2016-10-10 DIAGNOSIS — Z794 Long term (current) use of insulin: Secondary | ICD-10-CM

## 2016-10-10 DIAGNOSIS — E782 Mixed hyperlipidemia: Secondary | ICD-10-CM

## 2016-10-10 DIAGNOSIS — I10 Essential (primary) hypertension: Secondary | ICD-10-CM | POA: Diagnosis not present

## 2016-10-10 DIAGNOSIS — IMO0002 Reserved for concepts with insufficient information to code with codable children: Secondary | ICD-10-CM

## 2016-10-10 DIAGNOSIS — E1165 Type 2 diabetes mellitus with hyperglycemia: Secondary | ICD-10-CM

## 2016-10-10 NOTE — Patient Instructions (Signed)

## 2016-10-10 NOTE — Progress Notes (Signed)
Subjective:    Patient ID: Xavier Caldwell, male    DOB: 19-Feb-1950,    Past Medical History:  Diagnosis Date  . Anxiety   . Arthritis   . Back pain   . Diabetes mellitus without complication ([place])   . High cholesterol   . History of gout   . Hypertension   . Neuropathy   . 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 Sleep apnea    Stop Bang score of 4   Past Surgical History:  Procedure Laterality Date  . APPENDECTOMY    . CATARACT EXTRACTION W/PHACO Right 04/25/2014   Procedure: CATARACT EXTRACTION PHACO AND INTRAOCULAR LENS PLACEMENT (IOC);  Surgeon: Williams Che, MD;  Location: [facility];  Service: Ophthalmology;  Laterality: Right;  CDE:4.70  . CATARACT EXTRACTION W/PHACO Left 07/11/2014   Procedure: CATARACT EXTRACTION PHACO AND INTRAOCULAR LENS PLACEMENT LEFT EYE CDE=8.32;  Surgeon: Williams Che, MD;  Location: [facility];  Service: Ophthalmology;  Laterality: Left;  . COLONOSCOPY WITH PROPOFOL N/A 09/23/2016   Procedure: COLONOSCOPY WITH PROPOFOL;  Surgeon: Daneil Dolin, MD;  Location: [facility];  Service: Endoscopy;  Laterality: N/A;  . POLYPECTOMY  09/23/2016   Procedure: POLYPECTOMY;  Surgeon: Daneil Dolin, MD;  Location: [facility];  Service: Endoscopy;;  colon  . RETINAL DETACHMENT SURGERY     Social History   Social History  . Marital status: Married    Spouse name: N/A  . Number of children: N/A  . Years of education: N/A   Social History Main Topics  . Smoking status: Former Smoker    Packs/day: 1.50    Years: 30.00    Types: Cigarettes    Quit date: 07/05/2012  . Smokeless tobacco: Never Used  . Alcohol use No  . Drug use: No  . Sexual activity: No   Other Topics Concern  . None   Social History Narrative  . None   Outpatient Encounter Prescriptions as of 10/10/2016  Medication Sig  . ALPRAZolam (XANAX) 0.5 MG tablet Take 0.5 mg by mouth 2 (two) times daily as needed for anxiety.  Marland Kitchen aspirin EC 81 MG tablet Take 81 mg  by mouth daily.  . B Complex-C (SUPER B COMPLEX PO) Take 1 tablet by mouth daily.   . DULoxetine (CYMBALTA) 60 MG capsule Take 60 mg by mouth every evening.   . empagliflozin (JARDIANCE) 10 MG TABS tablet Take 10 mg by mouth daily.  . ergocalciferol (VITAMIN D2) 50000 units capsule Take 50,000 Units by mouth once a week. Tuesdays  . HYDROcodone-acetaminophen (NORCO/VICODIN) 5-325 MG per tablet Take 1 tablet by mouth every 6 (six) hours as needed for moderate pain.  . Insulin Glargine (TOUJEO SOLOSTAR) 300 UNIT/ML SOPN Inject 30 Units into the skin at bedtime.  . lidocaine (LIDODERM) 5 % Place 1 patch onto the skin daily as needed (pain). Remove & Discard patch within 12 hours or as directed by MD   . lisinopril (PRINIVIL,ZESTRIL) 40 MG tablet Take 40 mg by mouth daily.  . metFORMIN (GLUCOPHAGE) 1000 MG tablet Take 1 tablet (1,000 mg total) by mouth 2 (two) times daily.  . naloxegol oxalate (MOVANTIK) 25 MG TABS tablet Take 25 mg by mouth daily.   . polyethylene glycol-electrolytes (TRILYTE) 420 g solution Take 4,000 mLs by mouth as directed.  . pregabalin (LYRICA) 75 MG capsule Take 75-150 mg by mouth 2 (two) times daily. Takes 75mg  in the morning  and 150mg  in the evening.  Marland Kitchen rOPINIRole (REQUIP) 1 MG tablet Take 1 mg by mouth 3 (three) times daily.  . simvastatin (ZOCOR) 20 MG tablet Take 20 mg by mouth at bedtime.   . timolol (TIMOPTIC) 0.5 % ophthalmic solution Place 1 drop into the left eye daily.  Marland Kitchen UNIFINE PENTIPS 31G X 5 MM MISC USE TO INJECT INSULIN AT BEDTIME   No facility-administered encounter medications on file as of 10/10/2016.    ALLERGIES: No Known Allergies VACCINATION STATUS:  There is no immunization history on file for this patient.  Diabetes  He presents for his follow-up diabetic visit. He has type 2 diabetes mellitus. His disease course has been improving. Pertinent negatives for hypoglycemia include no confusion, headaches, pallor or seizures. Pertinent negatives for  diabetes include no chest pain, no fatigue, no polydipsia, no polyphagia, no polyuria and no weakness. Symptoms are improving. Risk factors for coronary artery disease include diabetes mellitus, hypertension and tobacco exposure. Current diabetic treatment includes insulin injections and oral agent (monotherapy). He is compliant with treatment most of the time. His weight is decreasing steadily. He is following a generally unhealthy diet. When asked about meal planning, he reported none. He has had a previous visit with a dietitian. He never participates in exercise. His breakfast blood glucose range is generally 140-180 mg/dl. His bedtime blood glucose range is generally 140-180 mg/dl. His overall blood glucose range is 140-180 mg/dl. An ACE inhibitor/angiotensin II receptor blocker is being taken.  Hyperlipidemia  Pertinent negatives include no chest pain, myalgias or shortness of breath.  Hypertension  Pertinent negatives include no chest pain, headaches, neck pain, palpitations or shortness of breath.    Review of Systems  Constitutional: Negative for fatigue and unexpected weight change.  HENT: Negative for dental problem, mouth sores and trouble swallowing.   Eyes: Negative for visual disturbance.  Respiratory: Negative for cough, choking, chest tightness, shortness of breath and wheezing.   Cardiovascular: Negative for chest pain, palpitations and leg swelling.  Gastrointestinal: Negative for abdominal distention, abdominal pain, constipation, diarrhea, nausea and vomiting.  Endocrine: Negative for polydipsia, polyphagia and polyuria.  Genitourinary: Negative for dysuria, flank pain, hematuria and urgency.  Musculoskeletal: Negative for back pain, gait problem, myalgias and neck pain.  Skin: Negative for pallor, rash and wound.  Neurological: Negative for seizures, syncope, weakness, numbness and headaches.  Psychiatric/Behavioral: Negative.  Negative for confusion and dysphoric mood.     Objective:    BP 134/85   Pulse 76   Ht 5\' 11"  (1.803 m)   Wt 214 lb (97.1 kg)   BMI 29.85 kg/m   Wt Readings from Last 3 Encounters:  10/10/16 214 lb (97.1 kg)  09/23/16 220 lb (99.8 kg)  09/20/16 220 lb (99.8 kg)    Physical Exam  Constitutional: He is oriented to person, place, and time. He appears well-developed and well-nourished. He is cooperative. No distress.  HENT:  Head: Normocephalic and atraumatic.  Eyes: EOM are normal.  Neck: Normal range of motion. Neck supple. No tracheal deviation present. No thyromegaly present.  Cardiovascular: Normal rate, S1 normal, S2 normal and normal heart sounds.  Exam reveals no gallop.   No murmur heard. Pulses:      Dorsalis pedis pulses are 1+ on the right side, and 1+ on the left side.       Posterior tibial pulses are 1+ on the right side, and 1+ on the left side.  Pulmonary/Chest: Breath sounds normal. No respiratory distress. He has no  wheezes.  Abdominal: Soft. Bowel sounds are normal. He exhibits no distension. There is no tenderness. There is no guarding and no CVA tenderness.  Musculoskeletal: He exhibits no edema.       Right shoulder: He exhibits no swelling and no deformity.  Neurological: He is alert and oriented to person, place, and time. He has normal strength and normal reflexes. No cranial nerve deficit or sensory deficit. Gait normal.  Skin: Skin is warm and dry. No rash noted. No cyanosis. Nails show no clubbing.  Psychiatric: He has a normal mood and affect. His speech is normal and behavior is normal. Judgment and thought content normal. Cognition and memory are normal.    Results for orders placed or performed in visit on 10/03/16  Renal function panel  Result Value Ref Range   Glucose 146 (H) 65 - 99 mg/dL   BUN 13 8 - 27 mg/dL   Creatinine, Ser 0.72 (L) 0.76 - 1.27 mg/dL   GFR calc non Af Amer 99 >59 mL/min/1.73   GFR calc Af Amer 114 >59 mL/min/1.73   BUN/Creatinine Ratio 18 10 - 24   Sodium 138 134  - 144 mmol/L   Potassium 4.8 3.5 - 5.2 mmol/L   Chloride 101 96 - 106 mmol/L   CO2 20 20 - 29 mmol/L   Calcium 9.2 8.6 - 10.2 mg/dL   Phosphorus 2.9 2.5 - 4.5 mg/dL   Albumin 4.2 3.6 - 4.8 g/dL  Hgb A1c w/o eAG  Result Value Ref Range   Hgb A1c MFr Bld 7.4 (H) 4.8 - 5.6 %   Diabetic Labs (most recent): Lab Results  Component Value Date   HGBA1C 7.4 (H) 10/03/2016   HGBA1C 8.7 (H) 07/03/2016   HGBA1C 7.6 (H) 04/01/2016    Assessment & Plan:   1. Uncontrolled type 2 diabetes mellitus with complication, with long-term current use of insulin ([place])  - patient remains at a high risk for more acute and chronic complications of diabetes which include CAD, CVA, CKD, retinopathy, and neuropathy. These are all discussed in detail with the patient.  Patient came with near target glucose profile, and  recent A1c   Improving to 7.4% from 8.7%.    Glucose logs and insulin administration records pertaining to this visit,  to be scanned into patient's records.  Recent labs reviewed.   - I have re-counseled the patient on diet management and weight loss  by adopting a carbohydrate restricted / protein rich  Diet.  - Suggestion is made for him to avoid simple carbohydrates  from his diet including Cakes, Sweet Desserts, Ice Cream, Soda (diet and regular), Sweet Tea, Candies, Chips, Cookies, Store Bought Juices, Alcohol in Excess of  1-2 drinks a day, Artificial Sweeteners, and "Sugar-free" Products. This will help patient to have stable blood glucose profile and potentially avoid unintended weight gain.   - Patient is advised to stick to a routine mealtimes to eat 3 meals  a day and avoid unnecessary snacks ( to snack only to correct hypoglycemia).  - I have approached patient with the following individualized plan to manage diabetes and patient agrees.  - I will increase Toujeo  to 30 units qhs, associated with monitoring of blood glucose 2 times a day-before breakfast and at bedtime.  -I will  continue Metformin 1000mg  po BID and  continue Jardiance at 10 mg by mouth every morning. Side effects and precautions discussed with him.   - Patient will be considered for incretin therapy as appropriate next  visit. - Patient specific target  for A1c; LDL, HDL, Triglycerides, and  Waist Circumference were discussed in detail.  2) BP/HTN: Controlled. Continue current medications including ACEI/ARB. 3) Lipids/HPL:  Controlled, continue statins. 4)  Weight/Diet: CDE consult in progress, exercise, and carbohydrates information provided.  5) Chronic Care/Health Maintenance:  -Patient is on ACEI/ARB and Statin medications and encouraged to continue to follow up with Ophthalmology, Podiatrist at least yearly or according to recommendations, and advised to  stay away from smoking. I have recommended yearly flu vaccine and pneumonia vaccination at least every 5 years; moderate intensity exercise for up to 150 minutes weekly; and  sleep for at least 7 hours a day.  I advised patient to maintain close follow up with his PCP for primary care needs.  Patient is asked to bring meter and  blood glucose logs during his next visit.  - Time spent with the patient: 25 min, of which >50% was spent in reviewing his sugar logs , discussing his hypo- and hyper-glycemic episodes, reviewing his current and  previous labs and insulin doses and developing a plan to avoid hypo- and hyper-glycemia.   Follow up plan: Return in about 3 months (around 01/09/2017) for follow up with pre-visit labs, meter, and logs.  Glade Lloyd, MD Phone: [phone]  Fax: [phone]  This note was partially dictated with voice recognition software. Similar sounding words can be transcribed inadequately or may not  be corrected upon review.  10/10/2016, 8:32 AM

## 2016-10-18 DIAGNOSIS — E1165 Type 2 diabetes mellitus with hyperglycemia: Secondary | ICD-10-CM | POA: Diagnosis not present

## 2016-10-18 DIAGNOSIS — H40011 Open angle with borderline findings, low risk, right eye: Secondary | ICD-10-CM | POA: Diagnosis not present

## 2016-10-18 DIAGNOSIS — H524 Presbyopia: Secondary | ICD-10-CM | POA: Diagnosis not present

## 2016-11-01 DIAGNOSIS — R69 Illness, unspecified: Secondary | ICD-10-CM | POA: Diagnosis not present

## 2016-11-06 DIAGNOSIS — Z1389 Encounter for screening for other disorder: Secondary | ICD-10-CM | POA: Diagnosis not present

## 2016-11-06 DIAGNOSIS — M5410 Radiculopathy, site unspecified: Secondary | ICD-10-CM | POA: Diagnosis not present

## 2016-11-06 DIAGNOSIS — E6609 Other obesity due to excess calories: Secondary | ICD-10-CM | POA: Diagnosis not present

## 2016-11-06 DIAGNOSIS — Z6830 Body mass index (BMI) 30.0-30.9, adult: Secondary | ICD-10-CM | POA: Diagnosis not present

## 2016-11-06 DIAGNOSIS — G8324 Monoplegia of upper limb affecting left nondominant side: Secondary | ICD-10-CM | POA: Diagnosis not present

## 2016-11-15 DIAGNOSIS — M545 Low back pain: Secondary | ICD-10-CM | POA: Diagnosis not present

## 2016-11-15 DIAGNOSIS — Z79899 Other long term (current) drug therapy: Secondary | ICD-10-CM | POA: Diagnosis not present

## 2016-11-15 DIAGNOSIS — R2689 Other abnormalities of gait and mobility: Secondary | ICD-10-CM | POA: Diagnosis not present

## 2016-11-15 DIAGNOSIS — M79602 Pain in left arm: Secondary | ICD-10-CM | POA: Diagnosis not present

## 2016-11-18 ENCOUNTER — Encounter: Payer: Self-pay | Admitting: Nurse Practitioner

## 2016-11-18 ENCOUNTER — Other Ambulatory Visit (HOSPITAL_COMMUNITY): Payer: Self-pay | Admitting: Neurology

## 2016-11-18 ENCOUNTER — Ambulatory Visit (HOSPITAL_COMMUNITY)
Admission: RE | Admit: 2016-11-18 | Discharge: 2016-11-18 | Disposition: A | Discharge status: Home / Self Care | Payer: Medicare HMO | Source: Ambulatory Visit | Attending: Neurology | Admitting: Neurology

## 2016-11-18 ENCOUNTER — Ambulatory Visit (INDEPENDENT_AMBULATORY_CARE_PROVIDER_SITE_OTHER): Payer: Medicare HMO | Admitting: Nurse Practitioner

## 2016-11-18 VITALS — BP 130/79 | HR 71 | Temp 97.2°F | Ht 71.0 in | Wt 214.6 lb

## 2016-11-18 DIAGNOSIS — M47812 Spondylosis without myelopathy or radiculopathy, cervical region: Secondary | ICD-10-CM | POA: Diagnosis not present

## 2016-11-18 DIAGNOSIS — I700 Atherosclerosis of aorta: Secondary | ICD-10-CM | POA: Diagnosis not present

## 2016-11-18 DIAGNOSIS — K5900 Constipation, unspecified: Secondary | ICD-10-CM | POA: Diagnosis not present

## 2016-11-18 DIAGNOSIS — M5412 Radiculopathy, cervical region: Secondary | ICD-10-CM

## 2016-11-18 DIAGNOSIS — R079 Chest pain, unspecified: Secondary | ICD-10-CM

## 2016-11-18 DIAGNOSIS — K649 Unspecified hemorrhoids: Secondary | ICD-10-CM | POA: Diagnosis not present

## 2016-11-18 DIAGNOSIS — M4802 Spinal stenosis, cervical region: Secondary | ICD-10-CM | POA: Diagnosis not present

## 2016-11-18 NOTE — Assessment & Plan Note (Signed)
His hemorrhoids have improved with improvement in constipation. Recommend she call and notify us of any worsening hemorrhoid symptoms for which we can send in a prescription topical cream for him. Return for follow-up in 6 months.

## 2016-11-18 NOTE — Patient Instructions (Signed)
1. Call us if you have any worsening hemorrhoid symptoms. 2. As we discussed, split the Movantik pill and to have this in take a half a pill every other day to help prevent straining. 3. If he stopped taking pain medications, stopped taking Movantik. 4. Return for follow-up in 6 months. 5. Call us if you have any questions or concerns.

## 2016-11-18 NOTE — Progress Notes (Signed)
CC'ED TO PCP 

## 2016-11-18 NOTE — Assessment & Plan Note (Signed)
The patient's constipation is improved but not optimally controlled. He is on Movantik about once a week, 25 mg. He has straining about every other day. I recommended he take Movantik 12.5 mg every other day to help with his regular straining. Call notify us of any worsening symptoms. Return for follow-up in 6 months for further evaluation.

## 2016-11-18 NOTE — Progress Notes (Signed)
Referring Provider: Redmond School, MD Primary Care Physician:  Redmond School, MD Primary GI:  Dr. Gala Romney  Chief Complaint  Patient presents with  . Constipation    improved    HPI:   Xavier Caldwell is a 66 y.o. male who presents For follow-up on constipation and abdominal pain. The patient was last seen in our office 09/18/2016 also for constipation and to schedule colonoscopy in the setting of chronic medications that pose a higher risk. Noted hemorrhoid symptoms with constipation and over-the-counter medication use his hemorrhoid symptoms improved. Constipation somewhat improved at his last visit but continues. This is chronic for the past 1-1-1/2 years. No other GI symptoms. Linzess did not help previously. He was arranged for colonoscopy.  Colonoscopy completed 09/23/2016 and found four 8-20 mm polyps in the descending colon, five 4-6 mm polyps in the rectum.  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 Recommended repeat exam in 3 years. He is on recall for this.  Today he states he's doing well overall. Reviewed colonoscopy results with him. His constipation is improved, having about once bowel movement a day, empties completely. Occasionally strains (about every other day). Not on any constipation medications. Has recently had to restart pain medication due to shoulder pain. Denies hematochezia, abdominal pain, N/V, melena, fever, chills, unintentional weight loss. Denies chest pain, dyspnea, dizziness, lightheadedness, syncope, near syncope. Denies any other upper or lower GI symptoms.  Past Medical History:  Diagnosis Date  . Anxiety   . Arthritis   . Back pain   . Diabetes mellitus without complication ([place])   . High cholesterol   . History of gout   . Hypertension   . Neuropathy   . 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 Sleep apnea    Stop Bang score of 4    Past Surgical  History:  Procedure Laterality Date  . APPENDECTOMY    . CATARACT EXTRACTION W/PHACO Right 04/25/2014   Procedure: CATARACT EXTRACTION PHACO AND INTRAOCULAR LENS PLACEMENT (IOC);  Surgeon: Williams Che, MD;  Location: [facility];  Service: Ophthalmology;  Laterality: Right;  CDE:4.70  . CATARACT EXTRACTION W/PHACO Left 07/11/2014   Procedure: CATARACT EXTRACTION PHACO AND INTRAOCULAR LENS PLACEMENT LEFT EYE CDE=8.32;  Surgeon: Williams Che, MD;  Location: [facility];  Service: Ophthalmology;  Laterality: Left;  . COLONOSCOPY WITH PROPOFOL N/A 09/23/2016   Procedure: COLONOSCOPY WITH PROPOFOL;  Surgeon: Daneil Dolin, MD;  [facility];  Service: Endoscopy;  Laterality: N/A;  . POLYPECTOMY  09/23/2016   Procedure: POLYPECTOMY;  Surgeon: Daneil Dolin, MD;  [facility];  Service: Endoscopy;;  colon  . RETINAL DETACHMENT SURGERY      Current Outpatient Prescriptions  Medication Sig Dispense Refill  . ALPRAZolam (XANAX) 0.5 MG tablet Take 0.5 mg by mouth 2 (two) times daily as needed for anxiety.    Marland Kitchen aspirin EC 81 MG tablet Take 81 mg by mouth daily.    . B Complex-C (SUPER B COMPLEX PO) Take 1 tablet by mouth daily.     . DULoxetine (CYMBALTA) 60 MG capsule Take 60 mg by mouth every evening.     . empagliflozin (JARDIANCE) 10 MG TABS tablet Take 10 mg by mouth daily.    . ergocalciferol (VITAMIN D2) 50000 units capsule Take 50,000 Units by mouth once a week. Tuesdays    . HYDROcodone-acetaminophen (NORCO/VICODIN)  5-325 MG per tablet Take 1 tablet by mouth every 6 (six) hours as needed for moderate pain.    . Insulin Glargine (TOUJEO SOLOSTAR) 300 UNIT/ML SOPN Inject 30 Units into the skin at bedtime. 4.5 mL 3  . lidocaine (LIDODERM) 5 % Place 1 patch onto the skin daily as needed (pain). Remove & Discard patch within 12 hours or as directed by MD     . lisinopril (PRINIVIL,ZESTRIL) 40 MG tablet Take 40 mg by mouth daily.    . metFORMIN (GLUCOPHAGE) 1000 MG tablet Take 1  tablet (1,000 mg total) by mouth 2 (two) times daily. 180 tablet 1  . naloxegol oxalate (MOVANTIK) 25 MG TABS tablet Take 25 mg by mouth daily.     . pregabalin (LYRICA) 75 MG capsule Take 75-150 mg by mouth 2 (two) times daily. Takes 150mg  in the morning and 150mg  in the evening.    Marland Kitchen rOPINIRole (REQUIP) 1 MG tablet Take 1 mg by mouth 3 (three) times daily.    . simvastatin (ZOCOR) 20 MG tablet Take 20 mg by mouth at bedtime.     . timolol (TIMOPTIC) 0.5 % ophthalmic solution Place 1 drop into the left eye daily.    Marland Kitchen UNIFINE PENTIPS 31G X 5 MM MISC USE TO INJECT INSULIN AT BEDTIME 100 each 5   No current facility-administered medications for this visit.     Allergies as of 11/18/2016  . (No Known Allergies)    Family History  Problem Relation Age of Onset  . Colon cancer Neg Hx     Social History   Social History  . Marital status: Married    Spouse name: N/A  . Number of children: N/A  . Years of education: N/A   Social History Main Topics  . Smoking status: Former Smoker    Packs/day: 1.50    Years: 30.00    Types: Cigarettes    Quit date: 07/05/2012  . Smokeless tobacco: Never Used  . Alcohol use No  . Drug use: No  . Sexual activity: No   Other Topics Concern  . None   Social History Narrative  . None    Review of Systems: General: Negative for anorexia, weight loss, fever, chills, fatigue, weakness. ENT: Negative for hoarseness, difficulty swallowing. CV: Negative for chest pain, angina, palpitations, peripheral edema.  Respiratory: Negative for dyspnea at rest, cough, sputum, wheezing.  GI: See history of present illness. Endo: Negative for unusual weight change.  Heme: Negative for bruising or bleeding.  Physical Exam: BP 130/79   Pulse 71   Temp (!) 97.2 F (36.2 C) (Oral)   Ht 5\' 11"  (1.803 m)   Wt 214 lb 9.6 oz (97.3 kg)   BMI 29.93 kg/m  General:   Alert and oriented. Pleasant and cooperative. Well-nourished and well-developed.  Eyes:   Without icterus, sclera clear and conjunctiva pink.  Ears:  Normal auditory acuity. Cardiovascular:  S1, S2 present without murmurs appreciated. Extremities without clubbing or edema. Respiratory:  Clear to auscultation bilaterally. No wheezes, rales, or rhonchi. No distress.  Gastrointestinal:  +BS, soft, non-tender and non-distended. No HSM noted. No guarding or rebound. No masses appreciated.  Rectal:  Deferred  Musculoskalatal:  Symmetrical without gross deformities. Ambulates with a cane. Neurologic:  Alert and oriented x4;  grossly normal neurologically. Psych:  Alert and cooperative. Normal mood and affect. Heme/Lymph/Immune: No excessive bruising noted.    11/18/2016 11:01 AM   Disclaimer: This note was dictated with voice recognition software. Similar sounding  words can inadvertently be transcribed and may not be corrected upon review.

## 2016-12-13 ENCOUNTER — Other Ambulatory Visit: Payer: Self-pay

## 2016-12-13 MED ORDER — EMPAGLIFLOZIN 10 MG PO TABS: 10.0000 mg | ORAL_TABLET | Freq: Every day | ORAL | 0 refills | Status: DC

## 2016-12-17 DIAGNOSIS — E113553 Type 2 diabetes mellitus with stable proliferative diabetic retinopathy, bilateral: Secondary | ICD-10-CM | POA: Diagnosis not present

## 2016-12-17 DIAGNOSIS — H401121 Primary open-angle glaucoma, left eye, mild stage: Secondary | ICD-10-CM | POA: Diagnosis not present

## 2016-12-17 DIAGNOSIS — T380X5S Adverse effect of glucocorticoids and synthetic analogues, sequela: Secondary | ICD-10-CM | POA: Diagnosis not present

## 2016-12-17 DIAGNOSIS — H33022 Retinal detachment with multiple breaks, left eye: Secondary | ICD-10-CM | POA: Diagnosis not present

## 2016-12-19 ENCOUNTER — Other Ambulatory Visit: Payer: Self-pay

## 2016-12-19 MED ORDER — EMPAGLIFLOZIN 10 MG PO TABS: 10.0000 mg | ORAL_TABLET | Freq: Every day | ORAL | 0 refills | Status: DC

## 2017-01-07 ENCOUNTER — Other Ambulatory Visit: Payer: Self-pay | Admitting: "Endocrinology

## 2017-01-07 DIAGNOSIS — Z794 Long term (current) use of insulin: Secondary | ICD-10-CM | POA: Diagnosis not present

## 2017-01-07 DIAGNOSIS — E118 Type 2 diabetes mellitus with unspecified complications: Secondary | ICD-10-CM | POA: Diagnosis not present

## 2017-01-07 DIAGNOSIS — E1165 Type 2 diabetes mellitus with hyperglycemia: Secondary | ICD-10-CM | POA: Diagnosis not present

## 2017-01-07 DIAGNOSIS — E782 Mixed hyperlipidemia: Secondary | ICD-10-CM | POA: Diagnosis not present

## 2017-01-08 LAB — RENAL FUNCTION PANEL
Albumin: 3.6 g/dL (ref 3.6–4.8)
BUN/Creatinine Ratio: 15 (ref 10–24)
BUN: 11 mg/dL (ref 8–27)
CO2: 24 mmol/L (ref 20–29)
CREATININE: 0.73 mg/dL — AB (ref 0.76–1.27)
Calcium: 9 mg/dL (ref 8.6–10.2)
Chloride: 101 mmol/L (ref 96–106)
GFR calc non Af Amer: 98 mL/min/{1.73_m2} (ref >59)
GFR, EST AFRICAN AMERICAN: 113 mL/min/{1.73_m2} (ref >59)
Glucose: 106 mg/dL — ABNORMAL HIGH (ref 65–99)
Phosphorus: 3.3 mg/dL (ref 2.5–4.5)
Potassium: 4.8 mmol/L (ref 3.5–5.2)
Sodium: 137 mmol/L (ref 134–144)

## 2017-01-08 LAB — HGB A1C W/O EAG: HEMOGLOBIN A1C: 7.7 % — AB (ref 4.8–5.6)

## 2017-01-08 LAB — LIPID PANEL W/O CHOL/HDL RATIO
Cholesterol, Total: 133 mg/dL (ref 100–199)
HDL: 32 mg/dL — ABNORMAL LOW (ref >39)
LDL Calculated: 83 mg/dL (ref 0–99)
Triglycerides: 89 mg/dL (ref 0–149)
VLDL CHOLESTEROL CAL: 18 mg/dL (ref 5–40)

## 2017-01-08 LAB — T4, FREE: FREE T4: 1.13 ng/dL (ref 0.82–1.77)

## 2017-01-08 LAB — MICROALBUMIN, URINE: Microalbumin, Urine: 4.2 ug/mL

## 2017-01-08 LAB — TSH: TSH: 0.314 u[IU]/mL — AB (ref 0.450–4.500)

## 2017-01-09 ENCOUNTER — Other Ambulatory Visit: Payer: Self-pay

## 2017-01-09 MED ORDER — EMPAGLIFLOZIN 10 MG PO TABS: 10.0000 mg | ORAL_TABLET | Freq: Every day | ORAL | 0 refills | Status: DC

## 2017-01-14 ENCOUNTER — Ambulatory Visit: Payer: Medicare HMO | Admitting: "Endocrinology

## 2017-01-17 ENCOUNTER — Other Ambulatory Visit: Payer: Self-pay

## 2017-01-17 MED ORDER — EMPAGLIFLOZIN 10 MG PO TABS: 10.0000 mg | ORAL_TABLET | Freq: Every day | ORAL | 0 refills | Status: DC

## 2017-01-17 NOTE — Telephone Encounter (Signed)
Jardiance Rx mailed to [facility]

## 2017-01-20 ENCOUNTER — Encounter: Payer: Self-pay | Admitting: "Endocrinology

## 2017-01-20 ENCOUNTER — Ambulatory Visit: Payer: Medicare HMO | Admitting: "Endocrinology

## 2017-01-20 VITALS — BP 148/82 | HR 62 | Ht 71.0 in | Wt 214.0 lb

## 2017-01-20 DIAGNOSIS — Z794 Long term (current) use of insulin: Secondary | ICD-10-CM

## 2017-01-20 DIAGNOSIS — E782 Mixed hyperlipidemia: Secondary | ICD-10-CM

## 2017-01-20 DIAGNOSIS — I10 Essential (primary) hypertension: Secondary | ICD-10-CM | POA: Diagnosis not present

## 2017-01-20 DIAGNOSIS — E118 Type 2 diabetes mellitus with unspecified complications: Secondary | ICD-10-CM

## 2017-01-20 DIAGNOSIS — IMO0002 Reserved for concepts with insufficient information to code with codable children: Secondary | ICD-10-CM

## 2017-01-20 DIAGNOSIS — E1140 Type 2 diabetes mellitus with diabetic neuropathy, unspecified: Secondary | ICD-10-CM | POA: Diagnosis not present

## 2017-01-20 DIAGNOSIS — M461 Sacroiliitis, not elsewhere classified: Secondary | ICD-10-CM | POA: Diagnosis not present

## 2017-01-20 DIAGNOSIS — E1165 Type 2 diabetes mellitus with hyperglycemia: Secondary | ICD-10-CM | POA: Diagnosis not present

## 2017-01-20 DIAGNOSIS — R2689 Other abnormalities of gait and mobility: Secondary | ICD-10-CM | POA: Diagnosis not present

## 2017-01-20 DIAGNOSIS — G20 Parkinson's disease: Secondary | ICD-10-CM | POA: Diagnosis not present

## 2017-01-20 MED ORDER — INSULIN GLARGINE 300 UNIT/ML ~~LOC~~ SOPN: 40.0000 [IU] | PEN_INJECTOR | Freq: Every day | SUBCUTANEOUS | 3 refills | Status: DC

## 2017-01-20 NOTE — Progress Notes (Signed)
Subjective:    Patient ID: Xavier Caldwell, male    DOB: [birth year],    Past Medical History:  Diagnosis Date  . Anxiety   . Arthritis   . Back pain   . Diabetes mellitus without complication ([place])   . High cholesterol   . History of gout   . Hypertension   . Neuropathy   . 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 Sleep apnea    Stop Bang score of 4   Past Surgical History:  Procedure Laterality Date  . APPENDECTOMY    . CATARACT EXTRACTION W/PHACO Right 04/25/2014   Procedure: CATARACT EXTRACTION PHACO AND INTRAOCULAR LENS PLACEMENT (IOC);  Surgeon: Williams Che, MD;  Location: [facility];  Service: Ophthalmology;  Laterality: Right;  CDE:4.70  . CATARACT EXTRACTION W/PHACO Left 07/11/2014   Procedure: CATARACT EXTRACTION PHACO AND INTRAOCULAR LENS PLACEMENT LEFT EYE CDE=8.32;  Surgeon: Williams Che, MD;  Location: [facility];  Service: Ophthalmology;  Laterality: Left;  . COLONOSCOPY WITH PROPOFOL N/A 09/23/2016   Procedure: COLONOSCOPY WITH PROPOFOL;  Surgeon: Daneil Dolin, MD;  Location: [facility];  Service: Endoscopy;  Laterality: N/A;  . POLYPECTOMY  09/23/2016   Procedure: POLYPECTOMY;  Surgeon: Daneil Dolin, MD;  Location: [facility];  Service: Endoscopy;;  colon  . RETINAL DETACHMENT SURGERY     Social History   Socioeconomic History  . Marital status: Married    Spouse name: None  . Number of children: None  . Years of education: None  . Highest education level: None  Social Needs  . Financial resource strain: None  . Food insecurity - worry: None  . Food insecurity - inability: None  . Transportation needs - medical: None  . Transportation needs - non-medical: None  Occupational History  . None  Tobacco Use  . Smoking status: Former Smoker    Packs/day: 1.50    Years: 30.00    Pack years: 45.00    Types: Cigarettes    Last attempt to quit: 07/05/2012    Years since quitting: 4.5  . Smokeless tobacco: Never Used  Substance  and Sexual Activity  . Alcohol use: No  . Drug use: No  . Sexual activity: No    Birth control/protection: None  Other Topics Concern  . None  Social History Narrative  . None   Outpatient Encounter Medications as of 01/20/2017  Medication Sig  . ALPRAZolam (XANAX) 0.5 MG tablet Take 0.5 mg by mouth 2 (two) times daily as needed for anxiety.  Marland Kitchen aspirin EC 81 MG tablet Take 81 mg by mouth daily.  . B Complex-C (SUPER B COMPLEX PO) Take 1 tablet by mouth daily.   . DULoxetine (CYMBALTA) 60 MG capsule Take 60 mg by mouth every evening.   . empagliflozin (JARDIANCE) 10 MG TABS tablet Take 10 mg by mouth daily.  . ergocalciferol (VITAMIN D2) 50000 units capsule Take 50,000 Units by mouth once a week. Tuesdays  . HYDROcodone-acetaminophen (NORCO/VICODIN) 5-325 MG per tablet Take 1 tablet by mouth every 6 (six) hours as needed for moderate pain.  . Insulin Glargine (TOUJEO SOLOSTAR) 300 UNIT/ML SOPN Inject 40 Units into the skin at bedtime.  . lidocaine (LIDODERM) 5 % Place 1 patch onto the skin daily as needed (pain). Remove & Discard patch within 12 hours or as directed by MD   . lisinopril (PRINIVIL,ZESTRIL) 40 MG tablet Take 40 mg by mouth daily.  Marland Kitchen  metFORMIN (GLUCOPHAGE) 1000 MG tablet Take 1 tablet (1,000 mg total) by mouth 2 (two) times daily.  . naloxegol oxalate (MOVANTIK) 25 MG TABS tablet Take 25 mg by mouth daily.   . pregabalin (LYRICA) 75 MG capsule Take 75-150 mg by mouth 2 (two) times daily. Takes 150mg  in the morning and 150mg  in the evening.  Marland Kitchen rOPINIRole (REQUIP) 1 MG tablet Take 1 mg by mouth 3 (three) times daily.  . simvastatin (ZOCOR) 20 MG tablet Take 20 mg by mouth at bedtime.   . timolol (TIMOPTIC) 0.5 % ophthalmic solution Place 1 drop into the left eye daily.  Marland Kitchen UNIFINE PENTIPS 31G X 5 MM MISC USE TO INJECT INSULIN AT BEDTIME  . [DISCONTINUED] Insulin Glargine (TOUJEO SOLOSTAR) 300 UNIT/ML SOPN Inject 30 Units into the skin at bedtime.   No facility-administered  encounter medications on file as of 01/20/2017.    ALLERGIES: No Known Allergies VACCINATION STATUS:  There is no immunization history on file for this patient.  Diabetes  He presents for his follow-up diabetic visit. He has type 2 diabetes mellitus. His disease course has been improving. Pertinent negatives for hypoglycemia include no confusion, headaches, pallor or seizures. Pertinent negatives for diabetes include no chest pain, no fatigue, no polydipsia, no polyphagia, no polyuria and no weakness. Symptoms are improving. Risk factors for coronary artery disease include diabetes mellitus, hypertension and tobacco exposure. Current diabetic treatment includes insulin injections and oral agent (monotherapy). He is compliant with treatment most of the time. His weight is decreasing steadily. He is following a generally unhealthy diet. When asked about meal planning, he reported none. He has had a previous visit with a dietitian. He never participates in exercise. His breakfast blood glucose range is generally 140-180 mg/dl. His bedtime blood glucose range is generally 140-180 mg/dl. His overall blood glucose range is 140-180 mg/dl. An ACE inhibitor/angiotensin II receptor blocker is being taken.  Hyperlipidemia  Pertinent negatives include no chest pain, myalgias or shortness of breath.  Hypertension  Pertinent negatives include no chest pain, headaches, neck pain, palpitations or shortness of breath.    Review of Systems  Constitutional: Negative for fatigue and unexpected weight change.  HENT: Negative for dental problem, mouth sores and trouble swallowing.   Eyes: Negative for visual disturbance.  Respiratory: Negative for cough, choking, chest tightness, shortness of breath and wheezing.   Cardiovascular: Negative for chest pain, palpitations and leg swelling.  Gastrointestinal: Negative for abdominal distention, abdominal pain, constipation, diarrhea, nausea and vomiting.  Endocrine:  Negative for polydipsia, polyphagia and polyuria.  Genitourinary: Negative for dysuria, flank pain, hematuria and urgency.  Musculoskeletal: Negative for back pain, gait problem, myalgias and neck pain.  Skin: Negative for pallor, rash and wound.  Neurological: Negative for seizures, syncope, weakness, numbness and headaches.  Psychiatric/Behavioral: Negative.  Negative for confusion and dysphoric mood.    Objective:    BP (!) 148/82   Pulse 62   Ht 5\' 11"  (1.803 m)   Wt 214 lb (97.1 kg)   BMI 29.85 kg/m   Wt Readings from Last 3 Encounters:  01/20/17 214 lb (97.1 kg)  11/18/16 214 lb 9.6 oz (97.3 kg)  10/10/16 214 lb (97.1 kg)    Physical Exam  Constitutional: He is oriented to person, place, and time. He appears well-developed and well-nourished. He is cooperative. No distress.  HENT:  Head: Normocephalic and atraumatic.  Eyes: EOM are normal.  Neck: Normal range of motion. Neck supple. No tracheal deviation present. No thyromegaly present.  Cardiovascular: Normal rate, S1 normal, S2 normal and normal heart sounds. Exam reveals no gallop.  No murmur heard. Pulses:      Dorsalis pedis pulses are 1+ on the right side, and 1+ on the left side.       Posterior tibial pulses are 1+ on the right side, and 1+ on the left side.  Pulmonary/Chest: Breath sounds normal. No respiratory distress. He has no wheezes.  Abdominal: Soft. Bowel sounds are normal. He exhibits no distension. There is no tenderness. There is no guarding and no CVA tenderness.  Musculoskeletal: He exhibits no edema.       Right shoulder: He exhibits no swelling and no deformity.  Neurological: He is alert and oriented to person, place, and time. He has normal strength and normal reflexes. No cranial nerve deficit or sensory deficit. Gait normal.  Skin: Skin is warm and dry. No rash noted. No cyanosis. Nails show no clubbing.  Psychiatric: He has a normal mood and affect. His speech is normal and behavior is normal.  Judgment and thought content normal. Cognition and memory are normal.    Results for orders placed or performed in visit on 01/07/17  Renal function panel  Result Value Ref Range   Glucose 106 (H) 65 - 99 mg/dL   BUN 11 8 - 27 mg/dL   Creatinine, Ser 0.73 (L) 0.76 - 1.27 mg/dL   GFR calc non Af Amer 98 >59 mL/min/1.73   GFR calc Af Amer 113 >59 mL/min/1.73   BUN/Creatinine Ratio 15 10 - 24   Sodium 137 134 - 144 mmol/L   Potassium 4.8 3.5 - 5.2 mmol/L   Chloride 101 96 - 106 mmol/L   CO2 24 20 - 29 mmol/L   Calcium 9.0 8.6 - 10.2 mg/dL   Phosphorus 3.3 2.5 - 4.5 mg/dL   Albumin 3.6 3.6 - 4.8 g/dL  Lipid Panel w/o Chol/HDL Ratio  Result Value Ref Range   Cholesterol, Total 133 100 - 199 mg/dL   Triglycerides 89 0 - 149 mg/dL   HDL 32 (L) >39 mg/dL   VLDL Cholesterol Cal 18 5 - 40 mg/dL   LDL Calculated 83 0 - 99 mg/dL  Hgb A1c w/o eAG  Result Value Ref Range   Hgb A1c MFr Bld 7.7 (H) 4.8 - 5.6 %  T4, free  Result Value Ref Range   Free T4 1.13 0.82 - 1.77 ng/dL  TSH  Result Value Ref Range   TSH 0.314 (L) 0.450 - 4.500 uIU/mL  Microalbumin, urine  Result Value Ref Range   Albumin, Urine 4.2 Not Estab. ug/mL   Diabetic Labs (most recent): Lab Results  Component Value Date   HGBA1C 7.7 (H) 01/07/2017   HGBA1C 7.4 (H) 10/03/2016   HGBA1C 8.7 (H) 07/03/2016    Assessment & Plan:   1. Uncontrolled type 2 diabetes mellitus with complication, with long-term current use of insulin ([place])  - patient remains at a high risk for more acute and chronic complications of diabetes which include CAD, CVA, CKD, retinopathy, and neuropathy. These are all discussed in detail with the patient.  Patient came with near target  fasting blood glucose profile, A1c 7.7% slightly increasing from 7.4% during last visit.    Glucose logs and insulin administration records pertaining to this visit,  to be scanned into patient's records.  Recent labs reviewed.   - I have re-counseled the  patient on diet management and weight loss  by adopting a carbohydrate restricted / protein rich  Diet.  -  Suggestion is made for him to avoid simple carbohydrates  from his diet including Cakes, Sweet Desserts / Pastries, Ice Cream, Soda (diet and regular), Sweet Tea, Candies, Chips, Cookies, Store Bought Juices, Alcohol in Excess of  1-2 drinks a day, Artificial Sweeteners, and "Sugar-free" Products. This will help patient to have stable blood glucose profile and potentially avoid unintended weight gain.  - Patient is advised to stick to a routine mealtimes to eat 3 meals  a day and avoid unnecessary snacks ( to snack only to correct hypoglycemia).  - I have approached patient with the following individualized plan to manage diabetes and patient agrees.  - I will increase Toujeo  to 40 units qhs, associated with monitoring of blood glucose 2 times a day-before breakfast and at bedtime.  -I will continue Metformin 1000mg  po BID and  discontinue Jardiance.  - Patient will be considered for incretin therapy as appropriate next visit. - Patient specific target  for A1c; LDL, HDL, Triglycerides, and  Waist Circumference were discussed in detail.  2) BP/HTN: uncontrolled. I advised him to continue current blood pressure medications including ACEI/ARB. 3) Lipids/HPL:  Controlled, continue statins. 4)  Weight/Diet: CDE consult in progress, exercise, and carbohydrates information provided.  5) Chronic Care/Health Maintenance:  -Patient is on ACEI/ARB and Statin medications and encouraged to continue to follow up with Ophthalmology, Podiatrist at least yearly or according to recommendations, and advised to  stay away from smoking. I have recommended yearly flu vaccine and pneumonia vaccination at least every 5 years; moderate intensity exercise for up to 150 minutes weekly; and  sleep for at least 7 hours a day.  I advised patient to maintain close follow up with his PCP for primary care needs. -  Time spent with the patient: 25 min, of which >50% was spent in reviewing his sugar logs , discussing his hypo- and hyper-glycemic episodes, reviewing his current and  previous labs and insulin doses and developing a plan to avoid hypo- and hyper-glycemia.    Follow up plan: Return in about 3 months (around 04/20/2017) for follow up with pre-visit labs, meter, and logs.  Glade Lloyd, MD Phone: [phone]  Fax: [phone]  This note was partially dictated with voice recognition software. Similar sounding words can be transcribed inadequately or may not  be corrected upon review.  01/20/2017, 5:20 PM

## 2017-01-20 NOTE — Patient Instructions (Signed)

## 2017-02-14 DIAGNOSIS — M461 Sacroiliitis, not elsewhere classified: Secondary | ICD-10-CM | POA: Diagnosis not present

## 2017-02-14 DIAGNOSIS — M545 Low back pain: Secondary | ICD-10-CM | POA: Diagnosis not present

## 2017-02-20 ENCOUNTER — Other Ambulatory Visit: Payer: Self-pay | Admitting: "Endocrinology

## 2017-02-20 MED ORDER — INSULIN DEGLUDEC 100 UNIT/ML ~~LOC~~ SOPN: 40.0000 [IU] | PEN_INJECTOR | Freq: Every day | SUBCUTANEOUS | 2 refills | Status: DC

## 2017-02-24 ENCOUNTER — Telehealth: Payer: Self-pay

## 2017-02-24 NOTE — Telephone Encounter (Signed)
May try Basaglar same dose.

## 2017-02-24 NOTE — Telephone Encounter (Signed)
Pt states that the Tresibas copay is $180 and wants to know if we can send something else

## 2017-02-25 MED ORDER — BASAGLAR KWIKPEN 100 UNIT/ML ~~LOC~~ SOPN: 40.0000 [IU] | PEN_INJECTOR | Freq: Every day | SUBCUTANEOUS | 2 refills | Status: DC

## 2017-03-19 DIAGNOSIS — M461 Sacroiliitis, not elsewhere classified: Secondary | ICD-10-CM | POA: Diagnosis not present

## 2017-03-19 DIAGNOSIS — M5412 Radiculopathy, cervical region: Secondary | ICD-10-CM | POA: Diagnosis not present

## 2017-03-19 DIAGNOSIS — G20 Parkinson's disease: Secondary | ICD-10-CM | POA: Diagnosis not present

## 2017-03-19 DIAGNOSIS — G894 Chronic pain syndrome: Secondary | ICD-10-CM | POA: Diagnosis not present

## 2017-03-25 DIAGNOSIS — H353112 Nonexudative age-related macular degeneration, right eye, intermediate dry stage: Secondary | ICD-10-CM | POA: Diagnosis not present

## 2017-03-25 DIAGNOSIS — E113553 Type 2 diabetes mellitus with stable proliferative diabetic retinopathy, bilateral: Secondary | ICD-10-CM | POA: Diagnosis not present

## 2017-03-25 DIAGNOSIS — T380X5S Adverse effect of glucocorticoids and synthetic analogues, sequela: Secondary | ICD-10-CM | POA: Diagnosis not present

## 2017-03-25 DIAGNOSIS — H401121 Primary open-angle glaucoma, left eye, mild stage: Secondary | ICD-10-CM | POA: Diagnosis not present

## 2017-03-25 DIAGNOSIS — H33022 Retinal detachment with multiple breaks, left eye: Secondary | ICD-10-CM | POA: Diagnosis not present

## 2017-03-27 DIAGNOSIS — Z1389 Encounter for screening for other disorder: Secondary | ICD-10-CM | POA: Diagnosis not present

## 2017-03-27 DIAGNOSIS — R69 Illness, unspecified: Secondary | ICD-10-CM | POA: Diagnosis not present

## 2017-03-27 DIAGNOSIS — Z6830 Body mass index (BMI) 30.0-30.9, adult: Secondary | ICD-10-CM | POA: Diagnosis not present

## 2017-03-27 DIAGNOSIS — E6609 Other obesity due to excess calories: Secondary | ICD-10-CM | POA: Diagnosis not present

## 2017-03-27 DIAGNOSIS — L820 Inflamed seborrheic keratosis: Secondary | ICD-10-CM | POA: Diagnosis not present

## 2017-03-30 ENCOUNTER — Other Ambulatory Visit: Payer: Self-pay | Admitting: "Endocrinology

## 2017-04-14 ENCOUNTER — Other Ambulatory Visit: Payer: Self-pay | Admitting: "Endocrinology

## 2017-04-14 DIAGNOSIS — Z794 Long term (current) use of insulin: Secondary | ICD-10-CM | POA: Diagnosis not present

## 2017-04-14 DIAGNOSIS — E1165 Type 2 diabetes mellitus with hyperglycemia: Secondary | ICD-10-CM | POA: Diagnosis not present

## 2017-04-14 DIAGNOSIS — E118 Type 2 diabetes mellitus with unspecified complications: Secondary | ICD-10-CM | POA: Diagnosis not present

## 2017-04-15 LAB — COMPREHENSIVE METABOLIC PANEL
A/G RATIO: 1.5 (ref 1.2–2.2)
ALT: 14 IU/L (ref 0–44)
AST: 12 IU/L (ref 0–40)
Albumin: 3.8 g/dL (ref 3.6–4.8)
Alkaline Phosphatase: 71 IU/L (ref 39–117)
BILIRUBIN TOTAL: 0.2 mg/dL (ref 0.0–1.2)
BUN/Creatinine Ratio: 12 (ref 10–24)
BUN: 9 mg/dL (ref 8–27)
CHLORIDE: 103 mmol/L (ref 96–106)
CO2: 24 mmol/L (ref 20–29)
Calcium: 9.6 mg/dL (ref 8.6–10.2)
Creatinine, Ser: 0.73 mg/dL — ABNORMAL LOW (ref 0.76–1.27)
GFR calc Af Amer: 113 mL/min/{1.73_m2} (ref >59)
GFR calc non Af Amer: 98 mL/min/{1.73_m2} (ref >59)
GLUCOSE: 145 mg/dL — AB (ref 65–99)
Globulin, Total: 2.5 g/dL (ref 1.5–4.5)
POTASSIUM: 4.9 mmol/L (ref 3.5–5.2)
Sodium: 141 mmol/L (ref 134–144)
TOTAL PROTEIN: 6.3 g/dL (ref 6.0–8.5)

## 2017-04-15 LAB — HGB A1C W/O EAG: HEMOGLOBIN A1C: 7.8 % — AB (ref 4.8–5.6)

## 2017-04-16 ENCOUNTER — Encounter (INDEPENDENT_AMBULATORY_CARE_PROVIDER_SITE_OTHER): Payer: Self-pay | Admitting: Orthopaedic Surgery

## 2017-04-16 ENCOUNTER — Ambulatory Visit (INDEPENDENT_AMBULATORY_CARE_PROVIDER_SITE_OTHER): Payer: Medicare HMO

## 2017-04-16 ENCOUNTER — Ambulatory Visit (INDEPENDENT_AMBULATORY_CARE_PROVIDER_SITE_OTHER): Payer: Medicare HMO | Admitting: Orthopaedic Surgery

## 2017-04-16 VITALS — BP 113/80 | HR 90 | Resp 14 | Ht 71.0 in | Wt 210.0 lb

## 2017-04-16 DIAGNOSIS — M25512 Pain in left shoulder: Secondary | ICD-10-CM | POA: Diagnosis not present

## 2017-04-16 DIAGNOSIS — M542 Cervicalgia: Secondary | ICD-10-CM | POA: Diagnosis not present

## 2017-04-16 NOTE — Progress Notes (Signed)
Office Visit Note   Patient: Xavier Caldwell           Date of Birth: 02-06-1950           MRN: [id number] Visit Date: 04/16/2017              Requested by: [address], [place], [place] [address] PCP: [facility] [address], MD   Assessment & Plan: Visit Diagnoses:  1. Acute pain of left shoulder   2. Cervicalgia     Plan: left upper extremity burning and tingling could have several etiologies. Has history of diabetic neuropathy. Is experiencing some pain from the cervical spine. We will order MRI scan of cervical spine. Presently on appropriate medicines with Lyrica and hydrocodone  Follow-Up Instructions: Return after MRI c-spine.   Orders:  Orders Placed This Encounter  Procedures  . XR Shoulder Left   No orders of the defined types were placed in this encounter.     Procedures: No procedures performed   Clinical Data: No additional findings.   Subjective: Xavier Caldwell companied by his wife and here for evaluation of chronic left upper extremity pain.is accompanied by his wife here for evaluation of chronic left upper extremity pain.  Noted insidious onset of pain approx 3 mos ago without injury or trauma..  He has been experiencing burning and tingling into the arm and forearm.  There is some association between neck motion is pain also having some mild left shoulder discomfort.  He does have a history of diabetes with neuropathy and presently is on Lyrica 150 mg twice a day.  Has been taking hydrocodone as needed.Dr Gweneth Dimitri a Medrol Dosepak for his present pain without much relief. No Symptoms referable to his right arm.  Has been on disability for approximately 3 years related to his diabetes occasions of diabetes.  Has had recent cervical spine films which I reviewed on [facility].  Degenerative changes are identified at C4-5 and C5-6 with anterior calcification.  No listhesis.  Straightening of the normal lordotic curve HPI  Review of Systems    Constitutional: Positive for fatigue. Negative for fever.  HENT: Negative for ear pain.   Eyes: Negative for pain.  Respiratory: Negative for cough and shortness of breath.   Cardiovascular: Negative for leg swelling.  Gastrointestinal: Negative for blood in stool, constipation and diarrhea.  Genitourinary: Negative for dysuria.  Musculoskeletal: Positive for back pain.  Skin: Positive for wound. Negative for rash.  Allergic/Immunologic: Negative for food allergies.  Neurological: Positive for weakness and numbness. Negative for dizziness, light-headedness and headaches.  Hematological: Does not bruise/bleed easily.  Psychiatric/Behavioral: Positive for sleep disturbance.     Objective: Vital Signs: BP 113/80 (BP Location: Left Arm, Patient Position: Sitting, Cuff Size: Normal)   Pulse 90   Resp 14   Ht 5\' 11"  (1.803 m)   Wt 210 lb (95.3 kg)   BMI 29.29 kg/m   Physical Exam  Ortho Exam awake alert and oriented x3.  Does experience some discomfort into his left upper extremity at rest including did not reproduce the pain in his left arm with motion of his left shoulder.  Can aggravate the left arm pain with motion of his neck flexion extension patient.  Biceps reflex slightly decreased from the right.  Good grip and good release.  Impingement with motion of the shoulder.  Good strength.  Skin intact.  Lacks full neck extension by approximately 30 degrees.  Specialty Comments:  No specialty comments  available.  Imaging: Xr Shoulder Left  Result Date: 04/16/2017 Films of the left shoulder obtained in 2 projections. The humeral head is centered about the glenoid.Some mild derative changes about the glenoid. Normal space between the humeral head and the acromium. Hypertrophic changes about the acromioclavicular joint consistent with arthritis. Inferiorly directed spurring of acromium    PMFS History: Patient Active Problem List   Diagnosis Date Noted  . Cervicalgia 04/16/2017   . Acute pain of left shoulder 04/16/2017  . Hemorrhoids 11/18/2016  . Taking multiple medications for chronic disease 09/18/2016  . Constipation 09/18/2016  . Uncontrolled type 2 diabetes mellitus with retinopathy, with long-term current use of insulin ([place]) 01/09/2016  . Uncontrolled type 2 diabetes mellitus with complication, with long-term current use of insulin ([place]) 12/19/2014  . Mixed hyperlipidemia 12/19/2014  . Essential hypertension, benign 12/19/2014   Past Medical History:  Diagnosis Date  . Anxiety   . Arthritis   . Back pain   . Diabetes mellitus without complication ([place])   . High cholesterol   . History of gout   . Hypertension   . Neuropathy   . 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 Sleep apnea    Stop Bang score of 4    Family History  Problem Relation Age of Onset  . Colon cancer Neg Hx     Past Surgical History:  Procedure Laterality Date  . APPENDECTOMY    . CATARACT EXTRACTION W/PHACO Right 04/25/2014   Procedure: CATARACT EXTRACTION PHACO AND INTRAOCULAR LENS PLACEMENT (IOC);  Surgeon: Williams Che, MD;  Location: [facility];  Service: Ophthalmology;  Laterality: Right;  CDE:4.70  . CATARACT EXTRACTION W/PHACO Left 07/11/2014   Procedure: CATARACT EXTRACTION PHACO AND INTRAOCULAR LENS PLACEMENT LEFT EYE CDE=8.32;  Surgeon: Williams Che, MD;  Location: [facility];  Service: Ophthalmology;  Laterality: Left;  . COLONOSCOPY WITH PROPOFOL N/A 09/23/2016   Procedure: COLONOSCOPY WITH PROPOFOL;  Surgeon: Daneil Dolin, MD;  [facility];  Service: Endoscopy;  Laterality: N/A;  . POLYPECTOMY  09/23/2016   Procedure: POLYPECTOMY;  Surgeon: Daneil Dolin, MD;  [facility];  Service: Endoscopy;;  colon  . RETINAL DETACHMENT SURGERY     Social History   Occupational History  . Not on file  Tobacco Use  . Smoking status: Former Smoker    Packs/day: 1.50    Years: 30.00    Pack years: 45.00    Types: Cigarettes     Last attempt to quit: 07/05/2012    Years since quitting: 4.7  . Smokeless tobacco: Never Used  Substance and Sexual Activity  . Alcohol use: No  . Drug use: No  . Sexual activity: No    Birth control/protection: None

## 2017-04-21 ENCOUNTER — Encounter: Payer: Self-pay | Admitting: "Endocrinology

## 2017-04-21 ENCOUNTER — Ambulatory Visit: Payer: Medicare HMO | Admitting: "Endocrinology

## 2017-04-21 VITALS — BP 135/83 | HR 86 | Ht 71.0 in | Wt 214.0 lb

## 2017-04-21 DIAGNOSIS — E782 Mixed hyperlipidemia: Secondary | ICD-10-CM

## 2017-04-21 DIAGNOSIS — I10 Essential (primary) hypertension: Secondary | ICD-10-CM

## 2017-04-21 DIAGNOSIS — E1165 Type 2 diabetes mellitus with hyperglycemia: Secondary | ICD-10-CM

## 2017-04-21 DIAGNOSIS — Z794 Long term (current) use of insulin: Secondary | ICD-10-CM

## 2017-04-21 DIAGNOSIS — E118 Type 2 diabetes mellitus with unspecified complications: Secondary | ICD-10-CM | POA: Diagnosis not present

## 2017-04-21 DIAGNOSIS — IMO0002 Reserved for concepts with insufficient information to code with codable children: Secondary | ICD-10-CM

## 2017-04-21 MED ORDER — BASAGLAR KWIKPEN 100 UNIT/ML ~~LOC~~ SOPN: 50.0000 [IU] | PEN_INJECTOR | Freq: Every day | SUBCUTANEOUS | 2 refills | Status: DC

## 2017-04-21 NOTE — Patient Instructions (Signed)

## 2017-04-21 NOTE — Progress Notes (Signed)
Subjective:    Patient ID: Xavier Caldwell, male    DOB: [birth year],    Past Medical History:  Diagnosis Date  . Anxiety   . Arthritis   . Back pain   . Diabetes mellitus without complication ([place])   . High cholesterol   . History of gout   . Hypertension   . Neuropathy   . 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 Sleep apnea    Stop Bang score of 4   Past Surgical History:  Procedure Laterality Date  . APPENDECTOMY    . CATARACT EXTRACTION W/PHACO Right 04/25/2014   Procedure: CATARACT EXTRACTION PHACO AND INTRAOCULAR LENS PLACEMENT (IOC);  Surgeon: Williams Che, MD;  Location: [facility];  Service: Ophthalmology;  Laterality: Right;  CDE:4.70  . CATARACT EXTRACTION W/PHACO Left 07/11/2014   Procedure: CATARACT EXTRACTION PHACO AND INTRAOCULAR LENS PLACEMENT LEFT EYE CDE=8.32;  Surgeon: Williams Che, MD;  Location: [facility];  Service: Ophthalmology;  Laterality: Left;  . COLONOSCOPY WITH PROPOFOL N/A 09/23/2016   Procedure: COLONOSCOPY WITH PROPOFOL;  Surgeon: Daneil Dolin, MD;  Location: [facility];  Service: Endoscopy;  Laterality: N/A;  . POLYPECTOMY  09/23/2016   Procedure: POLYPECTOMY;  Surgeon: Daneil Dolin, MD;  Location: [facility];  Service: Endoscopy;;  colon  . RETINAL DETACHMENT SURGERY     Social History   Socioeconomic History  . Marital status: Married    Spouse name: None  . Number of children: None  . Years of education: None  . Highest education level: None  Social Needs  . Financial resource strain: None  . Food insecurity - worry: None  . Food insecurity - inability: None  . Transportation needs - medical: None  . Transportation needs - non-medical: None  Occupational History  . None  Tobacco Use  . Smoking status: Former Smoker    Packs/day: 1.50    Years: 30.00    Pack years: 45.00    Types: Cigarettes    Last attempt to quit: 07/05/2012    Years since quitting: 4.7  . Smokeless tobacco: Never Used  Substance  and Sexual Activity  . Alcohol use: No  . Drug use: No  . Sexual activity: No    Birth control/protection: None  Other Topics Concern  . None  Social History Narrative  . None   Outpatient Encounter Medications as of 04/21/2017  Medication Sig  . Insulin Glargine (BASAGLAR KWIKPEN) 100 UNIT/ML SOPN Inject 0.5 mLs (50 Units total) into the skin at bedtime.  . metFORMIN (GLUCOPHAGE) 1000 MG tablet Take 1 tablet (1,000 mg total) by mouth 2 (two) times daily.  . methylPREDNISolone (MEDROL DOSEPAK) 4 MG TBPK tablet Take by mouth as directed.  . [DISCONTINUED] Insulin Glargine (BASAGLAR KWIKPEN) 100 UNIT/ML SOPN Inject 0.4 mLs (40 Units total) into the skin at bedtime.  . [DISCONTINUED] metFORMIN (GLUCOPHAGE) 1000 MG tablet TAKE 1 TABLET BY MOUTH TWICE DAILY  . ALPRAZolam (XANAX) 0.5 MG tablet Take 0.5 mg by mouth 2 (two) times daily as needed for anxiety.  Marland Kitchen aspirin EC 81 MG tablet Take 81 mg by mouth daily.  . B Complex-C (SUPER B COMPLEX PO) Take 1 tablet by mouth daily.   . DULoxetine (CYMBALTA) 60 MG capsule Take 60 mg by mouth every evening.   . empagliflozin (JARDIANCE) 10 MG TABS tablet Take 10 mg by mouth daily. (Patient not taking: Reported on 04/16/2017)  . ergocalciferol (VITAMIN  D2) 50000 units capsule Take 50,000 Units by mouth once a week. Tuesdays  . HYDROcodone-acetaminophen (NORCO/VICODIN) 5-325 MG per tablet Take 1 tablet by mouth every 6 (six) hours as needed for moderate pain.  Marland Kitchen lidocaine (LIDODERM) 5 % Place 1 patch onto the skin daily as needed (pain). Remove & Discard patch within 12 hours or as directed by MD   . lisinopril (PRINIVIL,ZESTRIL) 40 MG tablet Take 40 mg by mouth daily.  . naloxegol oxalate (MOVANTIK) 25 MG TABS tablet Take 25 mg by mouth daily.   . pregabalin (LYRICA) 75 MG capsule Take 75-150 mg by mouth 2 (two) times daily. Takes 150mg  in the morning and 150mg  in the evening.  Marland Kitchen rOPINIRole (REQUIP) 1 MG tablet Take 1 mg by mouth 3 (three) times daily.  .  simvastatin (ZOCOR) 20 MG tablet Take 20 mg by mouth at bedtime.   . timolol (TIMOPTIC) 0.5 % ophthalmic solution Place 1 drop into the left eye daily.  Marland Kitchen UNIFINE PENTIPS 31G X 5 MM MISC USE TO INJECT INSULIN AT BEDTIME  . [DISCONTINUED] insulin degludec (TRESIBA FLEXTOUCH) 100 UNIT/ML SOPN FlexTouch Pen Inject 0.4 mLs (40 Units total) into the skin daily at 10 pm.   No facility-administered encounter medications on file as of 04/21/2017.    ALLERGIES: No Known Allergies VACCINATION STATUS:  There is no immunization history on file for this patient.  Diabetes  He presents for his follow-up diabetic visit. He has type 2 diabetes mellitus. His disease course has been stable. Pertinent negatives for hypoglycemia include no confusion, headaches, pallor or seizures. Pertinent negatives for diabetes include no chest pain, no fatigue, no polydipsia, no polyphagia, no polyuria and no weakness. Symptoms are stable. Risk factors for coronary artery disease include diabetes mellitus, hypertension and tobacco exposure. Current diabetic treatment includes insulin injections and oral agent (monotherapy). He is compliant with treatment most of the time. His weight is stable. He is following a generally unhealthy diet. When asked about meal planning, he reported none. He has had a previous visit with a dietitian. He never participates in exercise. His breakfast blood glucose range is generally 140-180 mg/dl. His bedtime blood glucose range is generally 140-180 mg/dl. His overall blood glucose range is 140-180 mg/dl. An ACE inhibitor/angiotensin II receptor blocker is being taken.  Hyperlipidemia  This is a chronic problem. The current episode started more than 1 year ago. The problem is controlled. Exacerbating diseases include diabetes. Pertinent negatives include no chest pain, myalgias or shortness of breath. Risk factors for coronary artery disease include dyslipidemia, diabetes mellitus, hypertension, male sex  and a sedentary lifestyle.  Hypertension  This is a chronic problem. The current episode started more than 1 year ago. The problem is controlled. Pertinent negatives include no chest pain, headaches, neck pain, palpitations or shortness of breath. Risk factors for coronary artery disease include diabetes mellitus, dyslipidemia, male gender, sedentary lifestyle and smoking/tobacco exposure.    Review of Systems  Constitutional: Negative for fatigue and unexpected weight change.  HENT: Negative for dental problem, mouth sores and trouble swallowing.   Eyes: Negative for visual disturbance.  Respiratory: Negative for cough, choking, chest tightness, shortness of breath and wheezing.   Cardiovascular: Negative for chest pain, palpitations and leg swelling.  Gastrointestinal: Negative for abdominal distention, abdominal pain, constipation, diarrhea, nausea and vomiting.  Endocrine: Negative for polydipsia, polyphagia and polyuria.  Genitourinary: Negative for dysuria, flank pain, hematuria and urgency.  Musculoskeletal: Negative for back pain, gait problem, myalgias and neck pain.  Skin: Negative for pallor, rash and wound.  Neurological: Negative for seizures, syncope, weakness, numbness and headaches.  Psychiatric/Behavioral: Negative.  Negative for confusion and dysphoric mood.    Objective:    BP 135/83   Pulse 86   Ht 5\' 11"  (1.803 m)   Wt 214 lb (97.1 kg)   BMI 29.85 kg/m   Wt Readings from Last 3 Encounters:  04/21/17 214 lb (97.1 kg)  04/16/17 210 lb (95.3 kg)  01/20/17 214 lb (97.1 kg)    Physical Exam  Constitutional: He is oriented to person, place, and time. He appears well-developed and well-nourished. He is cooperative. No distress.  HENT:  Head: Normocephalic and atraumatic.  Eyes: EOM are normal.  Neck: Normal range of motion. Neck supple. No tracheal deviation present. No thyromegaly present.  Cardiovascular: Normal rate, S1 normal, S2 normal and normal heart  sounds. Exam reveals no gallop.  No murmur heard. Pulses:      Dorsalis pedis pulses are 1+ on the right side, and 1+ on the left side.       Posterior tibial pulses are 1+ on the right side, and 1+ on the left side.  Pulmonary/Chest: Breath sounds normal. No respiratory distress. He has no wheezes.  Abdominal: Soft. Bowel sounds are normal. He exhibits no distension. There is no tenderness. There is no guarding and no CVA tenderness.  Musculoskeletal: He exhibits no edema.       Right shoulder: He exhibits no swelling and no deformity.  Neurological: He is alert and oriented to person, place, and time. He has normal strength and normal reflexes. No cranial nerve deficit or sensory deficit. Gait normal.  Skin: Skin is warm and dry. No rash noted. No cyanosis. Nails show no clubbing.  Psychiatric: He has a normal mood and affect. His speech is normal and behavior is normal. Judgment and thought content normal. Cognition and memory are normal.    Results for orders placed or performed in visit on 04/14/17  Comprehensive metabolic panel  Result Value Ref Range   Glucose 145 (H) 65 - 99 mg/dL   BUN 9 8 - 27 mg/dL   Creatinine, Ser 0.73 (L) 0.76 - 1.27 mg/dL   GFR calc non Af Amer 98 >59 mL/min/1.73   GFR calc Af Amer 113 >59 mL/min/1.73   BUN/Creatinine Ratio 12 10 - 24   Sodium 141 134 - 144 mmol/L   Potassium 4.9 3.5 - 5.2 mmol/L   Chloride 103 96 - 106 mmol/L   CO2 24 20 - 29 mmol/L   Calcium 9.6 8.6 - 10.2 mg/dL   Total Protein 6.3 6.0 - 8.5 g/dL   Albumin 3.8 3.6 - 4.8 g/dL   Globulin, Total 2.5 1.5 - 4.5 g/dL   Albumin/Globulin Ratio 1.5 1.2 - 2.2   Bilirubin Total 0.2 0.0 - 1.2 mg/dL   Alkaline Phosphatase 71 39 - 117 IU/L   AST 12 0 - 40 IU/L   ALT 14 0 - 44 IU/L  Hgb A1c w/o eAG  Result Value Ref Range   Hgb A1c MFr Bld 7.8 (H) 4.8 - 5.6 %   Diabetic Labs (most recent): Lab Results  Component Value Date   HGBA1C 7.8 (H) 04/14/2017   HGBA1C 7.7 (H) 01/07/2017   HGBA1C  7.4 (H) 10/03/2016   Lipid Panel     Component Value Date/Time   CHOL 133 01/07/2017 0950   TRIG 89 01/07/2017 0950   HDL 32 (L) 01/07/2017 0950   LDLCALC 83 01/07/2017 0950  Assessment & Plan:   1. Uncontrolled type 2 diabetes mellitus with complication, with long-term current use of insulin ([place])  - patient remains at a high risk for more acute and chronic complications of diabetes which include CAD, CVA, CKD, retinopathy, and neuropathy. These are all discussed in detail with the patient.  Patient came with near target  fasting blood glucose profile, A1c remains above target at 7.8%.      Glucose logs and insulin administration records pertaining to this visit,  to be scanned into patient's records.  Recent labs reviewed.   - I have re-counseled the patient on diet management and weight loss  by adopting a carbohydrate restricted / protein rich  Diet.  -  Suggestion is made for him to avoid simple carbohydrates  from his diet including Cakes, Sweet Desserts / Pastries, Ice Cream, Soda (diet and regular), Sweet Tea, Candies, Chips, Cookies, Store Bought Juices, Alcohol in Excess of  1-2 drinks a day, Artificial Sweeteners, and "Sugar-free" Products. This will help patient to have stable blood glucose profile and potentially avoid unintended weight gain.   - Patient is advised to stick to a routine mealtimes to eat 3 meals  a day and avoid unnecessary snacks ( to snack only to correct hypoglycemia).  - I have approached patient with the following individualized plan to manage diabetes and patient agrees.  -He will need higher dose of insulin to control glycemia to target range.  -Insurance did not provide coverage for [place].  I advised him to continue on basal glargine at a higher dose of 50 units nightly associated with monitoring of blood glucose 2 times a day-before breakfast and at bedtime.  -I will continue Metformin 1000mg  po twice daily with meals .  - Patient will be  considered for incretin therapy as appropriate next visit. - Patient specific target  for A1c; LDL, HDL, Triglycerides, and  Waist Circumference were discussed in detail.  2) BP/HTN: His blood pressure is controlled to target.  He is advised to continue his current blood pressure medications including  ACEI/ARB. 3) Lipids/HPL:  Controlled, continue statins. 4)  Weight/Diet: CDE consult in progress, exercise, and carbohydrates information provided.  5) Chronic Care/Health Maintenance:  -Patient is on ACEI/ARB and Statin medications and encouraged to continue to follow up with Ophthalmology, Podiatrist at least yearly or according to recommendations, and advised to  stay away from smoking. I have recommended yearly flu vaccine and pneumonia vaccination at least every 5 years; moderate intensity exercise for up to 150 minutes weekly; and  sleep for at least 7 hours a day.  I advised patient to maintain close follow up with his PCP for primary care needs.   - Time spent with the patient: 25 min, of which >50% was spent in reviewing his blood glucose logs , discussing his hypo- and hyper-glycemic episodes, reviewing his current and  previous labs and insulin doses and developing a plan to avoid hypo- and hyper-glycemia. Please refer to Patient Instructions for Blood Glucose Monitoring and Insulin/Medications Dosing Guide"  in media tab for additional information. Xavier Caldwell participated in the discussions, expressed understanding, and voiced agreement with the above plans.  All questions were answered to his satisfaction. he is encouraged to contact clinic should he have any questions or concerns prior to his return visit.  Follow up plan: Return in about 4 months (around 08/21/2017) for meter, and logs.  Glade Lloyd, MD Phone: [phone]  Fax: [phone]  This note was partially dictated with  voice recognition software. Similar sounding words can be transcribed inadequately or may not  be  corrected upon review.  04/21/2017, 1:25 PM

## 2017-05-19 ENCOUNTER — Ambulatory Visit: Payer: Medicare HMO | Admitting: Gastroenterology

## 2017-06-16 DIAGNOSIS — Z0000 Encounter for general adult medical examination without abnormal findings: Secondary | ICD-10-CM | POA: Diagnosis not present

## 2017-06-16 DIAGNOSIS — E782 Mixed hyperlipidemia: Secondary | ICD-10-CM | POA: Diagnosis not present

## 2017-06-16 DIAGNOSIS — Z0001 Encounter for general adult medical examination with abnormal findings: Secondary | ICD-10-CM | POA: Diagnosis not present

## 2017-06-16 DIAGNOSIS — R69 Illness, unspecified: Secondary | ICD-10-CM | POA: Diagnosis not present

## 2017-06-16 DIAGNOSIS — E6609 Other obesity due to excess calories: Secondary | ICD-10-CM | POA: Diagnosis not present

## 2017-06-16 DIAGNOSIS — E119 Type 2 diabetes mellitus without complications: Secondary | ICD-10-CM | POA: Diagnosis not present

## 2017-06-16 DIAGNOSIS — Z1389 Encounter for screening for other disorder: Secondary | ICD-10-CM | POA: Diagnosis not present

## 2017-06-16 DIAGNOSIS — Z6830 Body mass index (BMI) 30.0-30.9, adult: Secondary | ICD-10-CM | POA: Diagnosis not present

## 2017-06-18 DIAGNOSIS — M461 Sacroiliitis, not elsewhere classified: Secondary | ICD-10-CM | POA: Diagnosis not present

## 2017-06-18 DIAGNOSIS — M5412 Radiculopathy, cervical region: Secondary | ICD-10-CM | POA: Diagnosis not present

## 2017-06-18 DIAGNOSIS — G894 Chronic pain syndrome: Secondary | ICD-10-CM | POA: Diagnosis not present

## 2017-06-18 DIAGNOSIS — G20 Parkinson's disease: Secondary | ICD-10-CM | POA: Diagnosis not present

## 2017-06-26 DIAGNOSIS — H3320 Serous retinal detachment, unspecified eye: Secondary | ICD-10-CM | POA: Diagnosis not present

## 2017-06-26 DIAGNOSIS — H40059 Ocular hypertension, unspecified eye: Secondary | ICD-10-CM | POA: Diagnosis not present

## 2017-06-26 DIAGNOSIS — F419 Anxiety disorder, unspecified: Secondary | ICD-10-CM | POA: Diagnosis not present

## 2017-06-26 DIAGNOSIS — G2581 Restless legs syndrome: Secondary | ICD-10-CM | POA: Diagnosis not present

## 2017-06-26 DIAGNOSIS — R69 Illness, unspecified: Secondary | ICD-10-CM | POA: Diagnosis not present

## 2017-06-26 DIAGNOSIS — G8929 Other chronic pain: Secondary | ICD-10-CM | POA: Diagnosis not present

## 2017-06-26 DIAGNOSIS — Z794 Long term (current) use of insulin: Secondary | ICD-10-CM | POA: Diagnosis not present

## 2017-06-26 DIAGNOSIS — E785 Hyperlipidemia, unspecified: Secondary | ICD-10-CM | POA: Diagnosis not present

## 2017-06-26 DIAGNOSIS — E1142 Type 2 diabetes mellitus with diabetic polyneuropathy: Secondary | ICD-10-CM | POA: Diagnosis not present

## 2017-06-27 DIAGNOSIS — R69 Illness, unspecified: Secondary | ICD-10-CM | POA: Diagnosis not present

## 2017-06-28 ENCOUNTER — Other Ambulatory Visit: Payer: Self-pay | Admitting: "Endocrinology

## 2017-07-07 DIAGNOSIS — R69 Illness, unspecified: Secondary | ICD-10-CM | POA: Diagnosis not present

## 2017-08-10 ENCOUNTER — Other Ambulatory Visit: Payer: Self-pay | Admitting: "Endocrinology

## 2017-08-14 ENCOUNTER — Other Ambulatory Visit: Payer: Self-pay | Admitting: "Endocrinology

## 2017-08-14 DIAGNOSIS — Z794 Long term (current) use of insulin: Secondary | ICD-10-CM | POA: Diagnosis not present

## 2017-08-14 DIAGNOSIS — E1165 Type 2 diabetes mellitus with hyperglycemia: Secondary | ICD-10-CM | POA: Diagnosis not present

## 2017-08-14 DIAGNOSIS — E118 Type 2 diabetes mellitus with unspecified complications: Secondary | ICD-10-CM | POA: Diagnosis not present

## 2017-08-14 DIAGNOSIS — E1140 Type 2 diabetes mellitus with diabetic neuropathy, unspecified: Secondary | ICD-10-CM | POA: Diagnosis not present

## 2017-08-15 LAB — COMPREHENSIVE METABOLIC PANEL
A/G RATIO: 1.9 (ref 1.2–2.2)
ALT: 11 IU/L (ref 0–44)
AST: 12 IU/L (ref 0–40)
Albumin: 4 g/dL (ref 3.6–4.8)
Alkaline Phosphatase: 69 IU/L (ref 39–117)
BILIRUBIN TOTAL: 0.3 mg/dL (ref 0.0–1.2)
BUN/Creatinine Ratio: 9 — ABNORMAL LOW (ref 10–24)
BUN: 7 mg/dL — ABNORMAL LOW (ref 8–27)
CHLORIDE: 104 mmol/L (ref 96–106)
CO2: 23 mmol/L (ref 20–29)
Calcium: 9.1 mg/dL (ref 8.6–10.2)
Creatinine, Ser: 0.74 mg/dL — ABNORMAL LOW (ref 0.76–1.27)
GFR calc non Af Amer: 97 mL/min/{1.73_m2} (ref >59)
GFR, EST AFRICAN AMERICAN: 112 mL/min/{1.73_m2} (ref >59)
Globulin, Total: 2.1 g/dL (ref 1.5–4.5)
Glucose: 121 mg/dL — ABNORMAL HIGH (ref 65–99)
Potassium: 4.7 mmol/L (ref 3.5–5.2)
Sodium: 140 mmol/L (ref 134–144)
TOTAL PROTEIN: 6.1 g/dL (ref 6.0–8.5)

## 2017-08-15 LAB — HGB A1C W/O EAG: Hgb A1c MFr Bld: 6.6 % — ABNORMAL HIGH (ref 4.8–5.6)

## 2017-08-15 LAB — SPECIMEN STATUS REPORT

## 2017-08-21 ENCOUNTER — Ambulatory Visit: Payer: Medicare HMO | Admitting: "Endocrinology

## 2017-08-21 ENCOUNTER — Encounter: Payer: Self-pay | Admitting: "Endocrinology

## 2017-08-21 VITALS — BP 133/82 | HR 82 | Ht 71.0 in | Wt 218.0 lb

## 2017-08-21 DIAGNOSIS — I10 Essential (primary) hypertension: Secondary | ICD-10-CM | POA: Diagnosis not present

## 2017-08-21 DIAGNOSIS — E1165 Type 2 diabetes mellitus with hyperglycemia: Secondary | ICD-10-CM

## 2017-08-21 DIAGNOSIS — E782 Mixed hyperlipidemia: Secondary | ICD-10-CM | POA: Diagnosis not present

## 2017-08-21 DIAGNOSIS — IMO0002 Reserved for concepts with insufficient information to code with codable children: Secondary | ICD-10-CM

## 2017-08-21 DIAGNOSIS — Z794 Long term (current) use of insulin: Secondary | ICD-10-CM | POA: Diagnosis not present

## 2017-08-21 DIAGNOSIS — E118 Type 2 diabetes mellitus with unspecified complications: Secondary | ICD-10-CM

## 2017-08-21 NOTE — Progress Notes (Signed)
Endocrinology follow-up note   Subjective:    Patient ID: Xavier Caldwell, male    DOB: February 26, 1950,    Past Medical History:  Diagnosis Date  . Anxiety   . Arthritis   . Back pain   . Diabetes mellitus without complication ([place])   . High cholesterol   . History of gout   . Hypertension   . Neuropathy   . 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 Sleep apnea    Stop Bang score of 4   Past Surgical History:  Procedure Laterality Date  . APPENDECTOMY    . CATARACT EXTRACTION W/PHACO Right 04/25/2014   Procedure: CATARACT EXTRACTION PHACO AND INTRAOCULAR LENS PLACEMENT (IOC);  Surgeon: Williams Che, MD;  Location: [facility];  Service: Ophthalmology;  Laterality: Right;  CDE:4.70  . CATARACT EXTRACTION W/PHACO Left 07/11/2014   Procedure: CATARACT EXTRACTION PHACO AND INTRAOCULAR LENS PLACEMENT LEFT EYE CDE=8.32;  Surgeon: Williams Che, MD;  Location: [facility];  Service: Ophthalmology;  Laterality: Left;  . COLONOSCOPY WITH PROPOFOL N/A 09/23/2016   Procedure: COLONOSCOPY WITH PROPOFOL;  Surgeon: Daneil Dolin, MD;  Location: [facility];  Service: Endoscopy;  Laterality: N/A;  . POLYPECTOMY  09/23/2016   Procedure: POLYPECTOMY;  Surgeon: Daneil Dolin, MD;  Location: [facility];  Service: Endoscopy;;  colon  . RETINAL DETACHMENT SURGERY     Social History   Socioeconomic History  . Marital status: Married    Spouse name: Not on file  . Number of children: Not on file  . Years of education: Not on file  . Highest education level: Not on file  Occupational History  . Not on file  Social Needs  . Financial resource strain: Not on file  . Food insecurity:    Worry: Not on file    Inability: Not on file  . Transportation needs:    Medical: Not on file    Non-medical: Not on file  Tobacco Use  . Smoking status: Former Smoker    Packs/day: 1.50    Years: 30.00    Pack years: 45.00    Types: Cigarettes    Last attempt to  quit: 07/05/2012    Years since quitting: 5.1  . Smokeless tobacco: Never Used  Substance and Sexual Activity  . Alcohol use: No  . Drug use: No  . Sexual activity: Never    Birth control/protection: None  Lifestyle  . Physical activity:    Days per week: Not on file    Minutes per session: Not on file  . Stress: Not on file  Relationships  . Social connections:    Talks on phone: Not on file    Gets together: Not on file    Attends religious service: Not on file    Active member of club or organization: Not on file    Attends meetings of clubs or organizations: Not on file    Relationship status: Not on file  Other Topics Concern  . Not on file  Social History Narrative  . Not on file   Outpatient Encounter Medications as of 08/21/2017  Medication Sig  . ALPRAZolam (XANAX) 0.5 MG tablet Take 0.5 mg by mouth 2 (two) times daily as needed for anxiety.  Marland Kitchen aspirin EC 81 MG tablet Take 81 mg by mouth daily.  Marland Kitchen  B Complex-C (SUPER B COMPLEX PO) Take 1 tablet by mouth daily.   . DULoxetine (CYMBALTA) 60 MG capsule Take 60 mg by mouth every evening.   . empagliflozin (JARDIANCE) 10 MG TABS tablet Take 10 mg by mouth daily. (Patient not taking: Reported on 04/16/2017)  . ergocalciferol (VITAMIN D2) 50000 units capsule Take 50,000 Units by mouth once a week. Tuesdays  . HYDROcodone-acetaminophen (NORCO/VICODIN) 5-325 MG per tablet Take 1 tablet by mouth every 6 (six) hours as needed for moderate pain.  . Insulin Glargine (BASAGLAR KWIKPEN) 100 UNIT/ML SOPN Inject 0.5 mLs (50 Units total) into the skin at bedtime.  . Insulin Glargine (BASAGLAR KWIKPEN) 100 UNIT/ML SOPN Inject 0.5 mLs (50 Units total) into the skin at bedtime.  . lidocaine (LIDODERM) 5 % Place 1 patch onto the skin daily as needed (pain). Remove & Discard patch within 12 hours or as directed by MD   . lisinopril (PRINIVIL,ZESTRIL) 40 MG tablet Take 40 mg by mouth daily.  . metFORMIN (GLUCOPHAGE) 1000 MG tablet Take 1 tablet  (1,000 mg total) by mouth 2 (two) times daily.  . metFORMIN (GLUCOPHAGE) 1000 MG tablet TAKE 1 TABLET BY MOUTH TWICE DAILY  . methylPREDNISolone (MEDROL DOSEPAK) 4 MG TBPK tablet Take by mouth as directed.  . naloxegol oxalate (MOVANTIK) 25 MG TABS tablet Take 25 mg by mouth daily.   . pregabalin (LYRICA) 75 MG capsule Take 75-150 mg by mouth 2 (two) times daily. Takes 150mg  in the morning and 150mg  in the evening.  Marland Kitchen rOPINIRole (REQUIP) 1 MG tablet Take 1 mg by mouth 3 (three) times daily.  . simvastatin (ZOCOR) 20 MG tablet Take 20 mg by mouth at bedtime.   . timolol (TIMOPTIC) 0.5 % ophthalmic solution Place 1 drop into the left eye daily.  Marland Kitchen UNIFINE PENTIPS 31G X 5 MM MISC USE TO INJECT INSULIN AT BEDTIME   No facility-administered encounter medications on file as of 08/21/2017.    ALLERGIES: No Known Allergies VACCINATION STATUS:  There is no immunization history on file for this patient.  Diabetes  He presents for his follow-up diabetic visit. He has type 2 diabetes mellitus. His disease course has been improving. Pertinent negatives for hypoglycemia include no confusion, headaches, pallor or seizures. Pertinent negatives for diabetes include no chest pain, no fatigue, no polydipsia, no polyphagia, no polyuria and no weakness. Symptoms are improving. Risk factors for coronary artery disease include diabetes mellitus, hypertension and tobacco exposure. Current diabetic treatment includes insulin injections and oral agent (monotherapy). He is compliant with treatment most of the time. His weight is stable. He is following a generally unhealthy diet. When asked about meal planning, he reported none. He has had a previous visit with a dietitian. He never participates in exercise. His breakfast blood glucose range is generally 130-140 mg/dl. His bedtime blood glucose range is generally 140-180 mg/dl. His overall blood glucose range is 140-180 mg/dl. An ACE inhibitor/angiotensin II receptor blocker  is being taken.  Hyperlipidemia  This is a chronic problem. The current episode started more than 1 year ago. The problem is controlled. Exacerbating diseases include diabetes. Pertinent negatives include no chest pain, myalgias or shortness of breath. Risk factors for coronary artery disease include dyslipidemia, diabetes mellitus, hypertension, male sex and a sedentary lifestyle.  Hypertension  This is a chronic problem. The current episode started more than 1 year ago. The problem is controlled. Pertinent negatives include no chest pain, headaches, neck pain, palpitations or shortness of breath. Risk factors for coronary  artery disease include diabetes mellitus, dyslipidemia, male gender, sedentary lifestyle and smoking/tobacco exposure.    Review of Systems  Constitutional: Negative for fatigue and unexpected weight change.  HENT: Negative for dental problem, mouth sores and trouble swallowing.   Eyes: Negative for visual disturbance.  Respiratory: Negative for cough, choking, chest tightness, shortness of breath and wheezing.   Cardiovascular: Negative for chest pain, palpitations and leg swelling.  Gastrointestinal: Negative for abdominal distention, abdominal pain, constipation, diarrhea, nausea and vomiting.  Endocrine: Negative for polydipsia, polyphagia and polyuria.  Genitourinary: Negative for dysuria, flank pain, hematuria and urgency.  Musculoskeletal: Negative for back pain, gait problem, myalgias and neck pain.  Skin: Negative for pallor, rash and wound.  Neurological: Negative for seizures, syncope, weakness, numbness and headaches.  Psychiatric/Behavioral: Negative.  Negative for confusion and dysphoric mood.    Objective:    BP 133/82   Pulse 82   Ht 5\' 11"  (1.803 m)   Wt 218 lb (98.9 kg)   BMI 30.40 kg/m   Wt Readings from Last 3 Encounters:  08/21/17 218 lb (98.9 kg)  04/21/17 214 lb (97.1 kg)  04/16/17 210 lb (95.3 kg)    Physical Exam  Constitutional: He is  oriented to person, place, and time. He appears well-developed. He is cooperative. No distress.  HENT:  Head: Normocephalic and atraumatic.  Eyes: EOM are normal.  Neck: Normal range of motion. Neck supple. No tracheal deviation present. No thyromegaly present.  Cardiovascular: Normal rate, S1 normal and S2 normal. Exam reveals no gallop.  No murmur heard. Pulses:      Dorsalis pedis pulses are 1+ on the right side, and 1+ on the left side.       Posterior tibial pulses are 1+ on the right side, and 1+ on the left side.  Pulmonary/Chest: Effort normal. No respiratory distress. He has no wheezes.  Abdominal: Soft. He exhibits no distension. There is no tenderness. There is no guarding and no CVA tenderness.  Musculoskeletal: He exhibits no edema.       Right shoulder: He exhibits no swelling and no deformity.  Neurological: He is alert and oriented to person, place, and time. He has normal strength. No cranial nerve deficit or sensory deficit. Gait normal.  Skin: Skin is warm and dry. No rash noted. No cyanosis. Nails show no clubbing.  Psychiatric: He has a normal mood and affect. His speech is normal. Judgment normal. Cognition and memory are normal.    Results for orders placed or performed in visit on 08/14/17  Comprehensive metabolic panel  Result Value Ref Range   Glucose 121 (H) 65 - 99 mg/dL   BUN 7 (L) 8 - 27 mg/dL   Creatinine, Ser 0.74 (L) 0.76 - 1.27 mg/dL   GFR calc non Af Amer 97 >59 mL/min/1.73   GFR calc Af Amer 112 >59 mL/min/1.73   BUN/Creatinine Ratio 9 (L) 10 - 24   Sodium 140 134 - 144 mmol/L   Potassium 4.7 3.5 - 5.2 mmol/L   Chloride 104 96 - 106 mmol/L   CO2 23 20 - 29 mmol/L   Calcium 9.1 8.6 - 10.2 mg/dL   Total Protein 6.1 6.0 - 8.5 g/dL   Albumin 4.0 3.6 - 4.8 g/dL   Globulin, Total 2.1 1.5 - 4.5 g/dL   Albumin/Globulin Ratio 1.9 1.2 - 2.2   Bilirubin Total 0.3 0.0 - 1.2 mg/dL   Alkaline Phosphatase 69 39 - 117 IU/L   AST 12 0 - 40 IU/L  ALT 11 0 -  44 IU/L  Hgb A1c w/o eAG  Result Value Ref Range   Hgb A1c MFr Bld 6.6 (H) 4.8 - 5.6 %  Specimen status report  Result Value Ref Range   specimen status report Comment    Diabetic Labs (most recent): Lab Results  Component Value Date   HGBA1C 6.6 (H) 08/14/2017   HGBA1C 7.8 (H) 04/14/2017   HGBA1C 7.7 (H) 01/07/2017   Lipid Panel     Component Value Date/Time   CHOL 133 01/07/2017 0950   TRIG 89 01/07/2017 0950   HDL 32 (L) 01/07/2017 0950   LDLCALC 83 01/07/2017 0950    Assessment & Plan:   1. Uncontrolled type 2 diabetes mellitus with complication, with long-term current use of insulin ([place])  -Patient came with significant improvement in his glycemic profile and A1c of 6.6%.   - Patient remains at a high risk for more acute and chronic complications of diabetes which include CAD, CVA, CKD, retinopathy, and neuropathy. These are all discussed in detail with the patient.    Glucose logs and insulin administration records pertaining to this visit,  to be scanned into patient's records.  Recent labs reviewed.   - I have re-counseled the patient on diet management and weight loss  by adopting a carbohydrate restricted / protein rich  Diet.  -  Suggestion is made for him to avoid simple carbohydrates  from his diet including Cakes, Sweet Desserts / Pastries, Ice Cream, Soda (diet and regular), Sweet Tea, Candies, Chips, Cookies, Store Bought Juices, Alcohol in Excess of  1-2 drinks a day, Artificial Sweeteners, and "Sugar-free" Products. This will help patient to have stable blood glucose profile and potentially avoid unintended weight gain.   - Patient is advised to stick to a routine mealtimes to eat 3 meals  a day and avoid unnecessary snacks ( to snack only to correct hypoglycemia).  - I have approached patient with the following individualized plan to manage diabetes and patient agrees.  -Based on his presentation with controlled glycemia and A1c of 6.6%, he will not  require higher dose of insulin at this time.   -I advised him to continue Basaglar 50 units nightly, associated with monitoring of blood glucose twice a day-daily before breakfast and at bedtime.   -He is advised not to take insulin without proper monitoring of blood glucose as ordered. -He is encouraged to call clinic if blood glucose readings are below 70 or above 2003. -I will continue Metformin 1000mg  po twice daily with meals .  - Patient specific target  for A1c; LDL, HDL, Triglycerides, and  Waist Circumference were discussed in detail.  2) BP/HTN: His blood pressure is controlled to target.   He is advised to continue his current blood pressure medications including lisinopril 40 mg p.o. daily.  3) Lipids/HPL: His recent lipid panel showed controlled LDL at 83.  He is advised to continue simvastatin 20 mg p.o. nightly.    4)  Weight/Diet: CDE consult in progress, exercise, and carbohydrates information provided.  5) Chronic Care/Health Maintenance:  -Patient is on ACEI  and Statin medications and encouraged to continue to follow up with Ophthalmology, Podiatrist at least yearly or according to recommendations, and advised to  stay away from smoking. I have recommended yearly flu vaccine and pneumonia vaccination at least every 5 years; moderate intensity exercise for up to 150 minutes weekly; and  sleep for at least 7 hours a day.  I advised patient to maintain  close follow up with his PCP for primary care needs.  - Time spent with the patient: 25 min, of which >50% was spent in reviewing his blood glucose logs , discussing his hypo- and hyper-glycemic episodes, reviewing his current and  previous labs and insulin doses and developing a plan to avoid hypo- and hyper-glycemia. Please refer to Patient Instructions for Blood Glucose Monitoring and Insulin/Medications Dosing Guide"  in media tab for additional information. [name] participated in the discussions, expressed  understanding, and voiced agreement with the above plans.  All questions were answered to his satisfaction. he is encouraged to contact clinic should he have any questions or concerns prior to his return visit.   Follow up plan: Return in about 4 months (around 12/22/2017) for follow up with pre-visit labs, meter, and logs.  Glade Lloyd, MD Phone: [phone]  Fax: [phone]  This note was partially dictated with voice recognition software. Similar sounding words can be transcribed inadequately or may not  be corrected upon review.  08/21/2017, 11:07 AM

## 2017-08-21 NOTE — Patient Instructions (Signed)

## 2017-09-16 DIAGNOSIS — E113553 Type 2 diabetes mellitus with stable proliferative diabetic retinopathy, bilateral: Secondary | ICD-10-CM | POA: Diagnosis not present

## 2017-09-16 DIAGNOSIS — T380X5A Adverse effect of glucocorticoids and synthetic analogues, initial encounter: Secondary | ICD-10-CM | POA: Diagnosis not present

## 2017-09-16 DIAGNOSIS — H401121 Primary open-angle glaucoma, left eye, mild stage: Secondary | ICD-10-CM | POA: Diagnosis not present

## 2017-09-16 DIAGNOSIS — T380X5S Adverse effect of glucocorticoids and synthetic analogues, sequela: Secondary | ICD-10-CM | POA: Diagnosis not present

## 2017-09-18 ENCOUNTER — Other Ambulatory Visit: Payer: Self-pay | Admitting: "Endocrinology

## 2017-10-15 DIAGNOSIS — M461 Sacroiliitis, not elsewhere classified: Secondary | ICD-10-CM | POA: Diagnosis not present

## 2017-10-15 DIAGNOSIS — M5412 Radiculopathy, cervical region: Secondary | ICD-10-CM | POA: Diagnosis not present

## 2017-10-15 DIAGNOSIS — G894 Chronic pain syndrome: Secondary | ICD-10-CM | POA: Diagnosis not present

## 2017-10-15 DIAGNOSIS — G20 Parkinson's disease: Secondary | ICD-10-CM | POA: Diagnosis not present

## 2017-10-21 DIAGNOSIS — R69 Illness, unspecified: Secondary | ICD-10-CM | POA: Diagnosis not present

## 2017-11-03 DIAGNOSIS — L989 Disorder of the skin and subcutaneous tissue, unspecified: Secondary | ICD-10-CM | POA: Diagnosis not present

## 2017-11-03 DIAGNOSIS — Z6830 Body mass index (BMI) 30.0-30.9, adult: Secondary | ICD-10-CM | POA: Diagnosis not present

## 2017-11-03 DIAGNOSIS — Z1389 Encounter for screening for other disorder: Secondary | ICD-10-CM | POA: Diagnosis not present

## 2017-12-16 ENCOUNTER — Other Ambulatory Visit: Payer: Self-pay | Admitting: "Endocrinology

## 2017-12-16 DIAGNOSIS — Z794 Long term (current) use of insulin: Secondary | ICD-10-CM | POA: Diagnosis not present

## 2017-12-16 DIAGNOSIS — E119 Type 2 diabetes mellitus without complications: Secondary | ICD-10-CM | POA: Diagnosis not present

## 2017-12-16 DIAGNOSIS — E118 Type 2 diabetes mellitus with unspecified complications: Secondary | ICD-10-CM | POA: Diagnosis not present

## 2017-12-16 DIAGNOSIS — E1165 Type 2 diabetes mellitus with hyperglycemia: Secondary | ICD-10-CM | POA: Diagnosis not present

## 2017-12-17 LAB — COMPREHENSIVE METABOLIC PANEL
A/G RATIO: 1.8 (ref 1.2–2.2)
ALT: 15 IU/L (ref 0–44)
AST: 14 IU/L (ref 0–40)
Albumin: 3.9 g/dL (ref 3.6–4.8)
Alkaline Phosphatase: 67 IU/L (ref 39–117)
BUN / CREAT RATIO: 16 (ref 10–24)
BUN: 13 mg/dL (ref 8–27)
Bilirubin Total: 0.3 mg/dL (ref 0.0–1.2)
CALCIUM: 9 mg/dL (ref 8.6–10.2)
CO2: 24 mmol/L (ref 20–29)
CREATININE: 0.79 mg/dL (ref 0.76–1.27)
Chloride: 102 mmol/L (ref 96–106)
GFR calc Af Amer: 109 mL/min/{1.73_m2} (ref >59)
GFR, EST NON AFRICAN AMERICAN: 94 mL/min/{1.73_m2} (ref >59)
Globulin, Total: 2.2 g/dL (ref 1.5–4.5)
Glucose: 90 mg/dL (ref 65–99)
Potassium: 5 mmol/L (ref 3.5–5.2)
SODIUM: 138 mmol/L (ref 134–144)
Total Protein: 6.1 g/dL (ref 6.0–8.5)

## 2017-12-17 LAB — SPECIMEN STATUS REPORT

## 2017-12-17 LAB — HGB A1C W/O EAG: Hgb A1c MFr Bld: 6.7 % — ABNORMAL HIGH (ref 4.8–5.6)

## 2017-12-23 ENCOUNTER — Other Ambulatory Visit: Payer: Self-pay | Admitting: "Endocrinology

## 2017-12-23 ENCOUNTER — Encounter: Payer: Self-pay | Admitting: "Endocrinology

## 2017-12-23 ENCOUNTER — Ambulatory Visit: Payer: Medicare HMO | Admitting: "Endocrinology

## 2017-12-23 VITALS — BP 146/83 | HR 70 | Ht 71.0 in | Wt 219.0 lb

## 2017-12-23 DIAGNOSIS — E1165 Type 2 diabetes mellitus with hyperglycemia: Secondary | ICD-10-CM

## 2017-12-23 DIAGNOSIS — E118 Type 2 diabetes mellitus with unspecified complications: Secondary | ICD-10-CM | POA: Diagnosis not present

## 2017-12-23 DIAGNOSIS — IMO0002 Reserved for concepts with insufficient information to code with codable children: Secondary | ICD-10-CM

## 2017-12-23 DIAGNOSIS — I10 Essential (primary) hypertension: Secondary | ICD-10-CM

## 2017-12-23 DIAGNOSIS — E782 Mixed hyperlipidemia: Secondary | ICD-10-CM | POA: Diagnosis not present

## 2017-12-23 DIAGNOSIS — Z794 Long term (current) use of insulin: Secondary | ICD-10-CM | POA: Diagnosis not present

## 2017-12-23 NOTE — Patient Instructions (Signed)

## 2017-12-23 NOTE — Progress Notes (Signed)
Endocrinology follow-up note   Subjective:    Patient ID: Xavier Caldwell, male    DOB: 09-18-1950,    Past Medical History:  Diagnosis Date  . Anxiety   . Arthritis   . Back pain   . Diabetes mellitus without complication ([place])   . High cholesterol   . History of gout   . Hypertension   . Neuropathy   . 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 Sleep apnea    Stop Bang score of 4   Past Surgical History:  Procedure Laterality Date  . APPENDECTOMY    . CATARACT EXTRACTION W/PHACO Right 04/25/2014   Procedure: CATARACT EXTRACTION PHACO AND INTRAOCULAR LENS PLACEMENT (IOC);  Surgeon: Williams Che, MD;  Location: [facility];  Service: Ophthalmology;  Laterality: Right;  CDE:4.70  . CATARACT EXTRACTION W/PHACO Left 07/11/2014   Procedure: CATARACT EXTRACTION PHACO AND INTRAOCULAR LENS PLACEMENT LEFT EYE CDE=8.32;  Surgeon: Williams Che, MD;  Location: [facility];  Service: Ophthalmology;  Laterality: Left;  . COLONOSCOPY WITH PROPOFOL N/A 09/23/2016   Procedure: COLONOSCOPY WITH PROPOFOL;  Surgeon: Daneil Dolin, MD;  Location: [facility];  Service: Endoscopy;  Laterality: N/A;  . POLYPECTOMY  09/23/2016   Procedure: POLYPECTOMY;  Surgeon: Daneil Dolin, MD;  Location: [facility];  Service: Endoscopy;;  colon  . RETINAL DETACHMENT SURGERY     Social History   Socioeconomic History  . Marital status: Married    Spouse name: Not on file  . Number of children: Not on file  . Years of education: Not on file  . Highest education level: Not on file  Occupational History  . Not on file  Social Needs  . Financial resource strain: Not on file  . Food insecurity:    Worry: Not on file    Inability: Not on file  . Transportation needs:    Medical: Not on file    Non-medical: Not on file  Tobacco Use  . Smoking status: Former Smoker    Packs/day: 1.50    Years: 30.00    Pack years: 45.00    Types: Cigarettes    Last attempt to  quit: 07/05/2012    Years since quitting: 5.4  . Smokeless tobacco: Never Used  Substance and Sexual Activity  . Alcohol use: No  . Drug use: No  . Sexual activity: Never    Birth control/protection: None  Lifestyle  . Physical activity:    Days per week: Not on file    Minutes per session: Not on file  . Stress: Not on file  Relationships  . Social connections:    Talks on phone: Not on file    Gets together: Not on file    Attends religious service: Not on file    Active member of club or organization: Not on file    Attends meetings of clubs or organizations: Not on file    Relationship status: Not on file  Other Topics Concern  . Not on file  Social History Narrative  . Not on file   Outpatient Encounter Medications as of 12/23/2017  Medication Sig  . ALPRAZolam (XANAX) 0.5 MG tablet Take 0.5 mg by mouth 2 (two) times daily as needed for anxiety.  Marland Kitchen aspirin EC 81 MG tablet Take 81 mg by mouth daily.  Marland Kitchen  B Complex-C (SUPER B COMPLEX PO) Take 1 tablet by mouth daily.   . DULoxetine (CYMBALTA) 60 MG capsule Take 60 mg by mouth every evening.   . ergocalciferol (VITAMIN D2) 50000 units capsule Take 50,000 Units by mouth once a week. Tuesdays  . HYDROcodone-acetaminophen (NORCO/VICODIN) 5-325 MG per tablet Take 1 tablet by mouth every 6 (six) hours as needed for moderate pain.  . Insulin Glargine (BASAGLAR KWIKPEN) 100 UNIT/ML SOPN Inject 0.5 mLs (50 Units total) into the skin at bedtime.  . lidocaine (LIDODERM) 5 % Place 1 patch onto the skin daily as needed (pain). Remove & Discard patch within 12 hours or as directed by MD   . lisinopril (PRINIVIL,ZESTRIL) 40 MG tablet Take 40 mg by mouth daily.  . metFORMIN (GLUCOPHAGE) 1000 MG tablet Take 1 tablet (1,000 mg total) by mouth 2 (two) times daily.  . naloxegol oxalate (MOVANTIK) 25 MG TABS tablet Take 25 mg by mouth daily.   . pregabalin (LYRICA) 75 MG capsule Take 75-150 mg by mouth 2 (two) times daily. Takes 150mg  in the morning  and 150mg  in the evening.  Marland Kitchen rOPINIRole (REQUIP) 1 MG tablet Take 1 mg by mouth 3 (three) times daily.  . simvastatin (ZOCOR) 20 MG tablet Take 20 mg by mouth at bedtime.   . timolol (TIMOPTIC) 0.5 % ophthalmic solution Place 1 drop into the left eye daily.  Marland Kitchen UNIFINE PENTIPS 31G X 5 MM MISC USE TO INJECT INSULIN AT BEDTIME  . [DISCONTINUED] Insulin Glargine (BASAGLAR KWIKPEN) 100 UNIT/ML SOPN Inject 0.5 mLs (50 Units total) into the skin at bedtime.  . [DISCONTINUED] metFORMIN (GLUCOPHAGE) 1000 MG tablet TAKE 1 TABLET BY MOUTH TWICE DAILY  . [DISCONTINUED] methylPREDNISolone (MEDROL DOSEPAK) 4 MG TBPK tablet Take by mouth as directed.   No facility-administered encounter medications on file as of 12/23/2017.    ALLERGIES: No Known Allergies VACCINATION STATUS:  There is no immunization history on file for this patient.  Diabetes  He presents for his follow-up diabetic visit. He has type 2 diabetes mellitus. His disease course has been stable. Pertinent negatives for hypoglycemia include no confusion, headaches, pallor or seizures. Pertinent negatives for diabetes include no chest pain, no fatigue, no polydipsia, no polyphagia, no polyuria and no weakness. Symptoms are stable. Risk factors for coronary artery disease include diabetes mellitus, hypertension and tobacco exposure. Current diabetic treatment includes insulin injections and oral agent (monotherapy). He is compliant with treatment most of the time. His weight is stable. He is following a generally unhealthy diet. When asked about meal planning, he reported none. He has had a previous visit with a dietitian. He never participates in exercise. His breakfast blood glucose range is generally 130-140 mg/dl. His bedtime blood glucose range is generally 140-180 mg/dl. His overall blood glucose range is 140-180 mg/dl. An ACE inhibitor/angiotensin II receptor blocker is being taken.  Hyperlipidemia  This is a chronic problem. The current  episode started more than 1 year ago. The problem is controlled. Exacerbating diseases include diabetes. Pertinent negatives include no chest pain, myalgias or shortness of breath. Risk factors for coronary artery disease include dyslipidemia, diabetes mellitus, hypertension, male sex and a sedentary lifestyle.  Hypertension  This is a chronic problem. The current episode started more than 1 year ago. The problem is controlled. Pertinent negatives include no chest pain, headaches, neck pain, palpitations or shortness of breath. Risk factors for coronary artery disease include diabetes mellitus, dyslipidemia, male gender, sedentary lifestyle and smoking/tobacco exposure.    Review  of Systems  Constitutional: Negative for fatigue and unexpected weight change.  HENT: Negative for dental problem, mouth sores and trouble swallowing.   Eyes: Negative for visual disturbance.  Respiratory: Negative for cough, choking, chest tightness, shortness of breath and wheezing.   Cardiovascular: Negative for chest pain, palpitations and leg swelling.  Gastrointestinal: Negative for abdominal distention, abdominal pain, constipation, diarrhea, nausea and vomiting.  Endocrine: Negative for polydipsia, polyphagia and polyuria.  Genitourinary: Negative for dysuria, flank pain, hematuria and urgency.  Musculoskeletal: Negative for back pain, gait problem, myalgias and neck pain.  Skin: Negative for pallor, rash and wound.  Neurological: Negative for seizures, syncope, weakness, numbness and headaches.  Psychiatric/Behavioral: Negative for confusion and dysphoric mood.    Objective:    BP (!) 146/83   Pulse 70   Ht 5\' 11"  (1.803 m)   Wt 219 lb (99.3 kg)   BMI 30.54 kg/m   Wt Readings from Last 3 Encounters:  12/23/17 219 lb (99.3 kg)  08/21/17 218 lb (98.9 kg)  04/21/17 214 lb (97.1 kg)    Physical Exam  Constitutional: He is oriented to person, place, and time. He appears well-developed. He is  cooperative. No distress.  HENT:  Head: Normocephalic and atraumatic.  Eyes: EOM are normal.  Neck: Normal range of motion. Neck supple. No tracheal deviation present. No thyromegaly present.  Cardiovascular: Normal rate, S1 normal and S2 normal. Exam reveals no gallop.  No murmur heard. Pulses:      Dorsalis pedis pulses are 1+ on the right side, and 1+ on the left side.       Posterior tibial pulses are 1+ on the right side, and 1+ on the left side.  Pulmonary/Chest: Effort normal. No respiratory distress. He has no wheezes.  Abdominal: Soft. He exhibits no distension. There is no tenderness. There is no guarding and no CVA tenderness.  Musculoskeletal: He exhibits no edema.       Right shoulder: He exhibits no swelling and no deformity.  Neurological: He is alert and oriented to person, place, and time. He has normal strength. No cranial nerve deficit or sensory deficit. Gait normal.  Skin: Skin is warm and dry. No rash noted. No cyanosis. Nails show no clubbing.  Psychiatric: He has a normal mood and affect. His speech is normal. Judgment normal. Cognition and memory are normal.    Results for orders placed or performed in visit on 12/16/17  Comprehensive metabolic panel  Result Value Ref Range   Glucose 90 65 - 99 mg/dL   BUN 13 8 - 27 mg/dL   Creatinine, Ser 0.79 0.76 - 1.27 mg/dL   GFR calc non Af Amer 94 >59 mL/min/1.73   GFR calc Af Amer 109 >59 mL/min/1.73   BUN/Creatinine Ratio 16 10 - 24   Sodium 138 134 - 144 mmol/L   Potassium 5.0 3.5 - 5.2 mmol/L   Chloride 102 96 - 106 mmol/L   CO2 24 20 - 29 mmol/L   Calcium 9.0 8.6 - 10.2 mg/dL   Total Protein 6.1 6.0 - 8.5 g/dL   Albumin 3.9 3.6 - 4.8 g/dL   Globulin, Total 2.2 1.5 - 4.5 g/dL   Albumin/Globulin Ratio 1.8 1.2 - 2.2   Bilirubin Total 0.3 0.0 - 1.2 mg/dL   Alkaline Phosphatase 67 39 - 117 IU/L   AST 14 0 - 40 IU/L   ALT 15 0 - 44 IU/L  Hgb A1c w/o eAG  Result Value Ref Range   Hgb A1c MFr  Bld 6.7 (H) 4.8 -  5.6 %  Specimen status report  Result Value Ref Range   specimen status report Comment    Diabetic Labs (most recent): Lab Results  Component Value Date   HGBA1C 6.7 (H) 12/16/2017   HGBA1C 6.6 (H) 08/14/2017   HGBA1C 7.8 (H) 04/14/2017   Lipid Panel     Component Value Date/Time   CHOL 133 01/07/2017 0950   TRIG 89 01/07/2017 0950   HDL 32 (L) 01/07/2017 0950   LDLCALC 83 01/07/2017 0950    Assessment & Plan:   1. Uncontrolled type 2 diabetes mellitus with complication, with long-term current use of insulin ([place])  -Patient came with stable and controlled glycemic profile and A1c of 6.7%.     - Patient remains at a high risk for more acute and chronic complications of diabetes which include CAD, CVA, CKD, retinopathy, and neuropathy. These are all discussed in detail with the patient.    Glucose logs and insulin administration records pertaining to this visit,  to be scanned into patient's records.  Recent labs reviewed.   - I have re-counseled the patient on diet management and weight loss  by adopting a carbohydrate restricted / protein rich  Diet.  -  Suggestion is made for him to avoid simple carbohydrates  from his diet including Cakes, Sweet Desserts / Pastries, Ice Cream, Soda (diet and regular), Sweet Tea, Candies, Chips, Cookies, Store Bought Juices, Alcohol in Excess of  1-2 drinks a day, Artificial Sweeteners, and "Sugar-free" Products. This will help patient to have stable blood glucose profile and potentially avoid unintended weight gain.  - Patient is advised to stick to a routine mealtimes to eat 3 meals  a day and avoid unnecessary snacks ( to snack only to correct hypoglycemia).  - I have approached patient with the following individualized plan to manage diabetes and patient agrees.  -Based on his presentation with controlled glycemia and A1c of 6.7%, he will not require higher dose of insulin at this time.   -He is advised to continue Basaglar 50 units  nightly, associated with monitoring of blood glucose twice a day-daily before breakfast and at bedtime.   -He is advised not to take insulin without proper monitoring of blood glucose as ordered. -He is encouraged to call clinic if blood glucose readings are below 70 or above 2003. -He is advised to continue metformin 1000 mg p.o. twice daily with breakfast and supper.    - Patient specific target  for A1c; LDL, HDL, Triglycerides, and  Waist Circumference were discussed in detail.  2) BP/HTN: His blood pressure is not controlled to target.   He is advised to continue his current blood pressure medications including lisinopril 40 mg p.o. daily.  3) Lipids/HPL: His recent lipid panel showed controlled LDL at 83.  He is advised to continue simvastatin 20 mg p.o. nightly.    4)  Weight/Diet: CDE consult in progress, exercise, and carbohydrates information provided.  5) Chronic Care/Health Maintenance:  -Patient is on ACEI  and Statin medications and encouraged to continue to follow up with Ophthalmology, Podiatrist at least yearly or according to recommendations, and advised to  stay away from smoking. I have recommended yearly flu vaccine and pneumonia vaccination at least every 5 years; moderate intensity exercise for up to 150 minutes weekly; and  sleep for at least 7 hours a day.  I advised patient to maintain close follow up with his PCP for primary care needs.  - Time spent  with the patient: 25 min, of which >50% was spent in reviewing his blood glucose logs , discussing his hypo- and hyper-glycemic episodes, reviewing his current and  previous labs and insulin doses and developing a plan to avoid hypo- and hyper-glycemia. Please refer to Patient Instructions for Blood Glucose Monitoring and Insulin/Medications Dosing Guide"  in media tab for additional information. [name] participated in the discussions, expressed understanding, and voiced agreement with the above plans.  All questions  were answered to his satisfaction. he is encouraged to contact clinic should he have any questions or concerns prior to his return visit.   Follow up plan: Return in about 4 months (around 04/23/2018) for Follow up with Pre-visit Labs, Meter, and Logs.  Glade Lloyd, MD Phone: [phone]  Fax: [phone]  This note was partially dictated with voice recognition software. Similar sounding words can be transcribed inadequately or may not  be corrected upon review.  12/23/2017, 12:28 PM

## 2017-12-31 ENCOUNTER — Other Ambulatory Visit: Payer: Self-pay | Admitting: "Endocrinology

## 2017-12-31 ENCOUNTER — Telehealth: Payer: Self-pay | Admitting: "Endocrinology

## 2017-12-31 DIAGNOSIS — R69 Illness, unspecified: Secondary | ICD-10-CM | POA: Diagnosis not present

## 2017-12-31 MED ORDER — GLUCOSE BLOOD VI STRP: 1.0000 | ORAL_STRIP | Freq: Two times a day (BID) | 5 refills | Status: DC

## 2017-12-31 NOTE — Telephone Encounter (Signed)
Xavier Caldwell is asking for a Rx for Test Strips for a One Touch Ultra 2 #100 sent to [facility], please advise?

## 2017-12-31 NOTE — Telephone Encounter (Signed)
rx sent

## 2018-02-10 DIAGNOSIS — R69 Illness, unspecified: Secondary | ICD-10-CM | POA: Diagnosis not present

## 2018-02-17 ENCOUNTER — Telehealth: Payer: Self-pay

## 2018-02-17 NOTE — Telephone Encounter (Signed)
lantus , levemir, toujeo, and tresiba are acceptable alternatives.  See if he can verify that with his pharmacist and we will switch.

## 2018-02-17 NOTE — Telephone Encounter (Signed)
[employer] insurance no longer covers Insulin Glargine (BASAGLAR KWIKPEN) 100 UNIT/ML SOPN Please advise of an alternative

## 2018-02-25 DIAGNOSIS — Z23 Encounter for immunization: Secondary | ICD-10-CM | POA: Diagnosis not present

## 2018-02-25 DIAGNOSIS — G5762 Lesion of plantar nerve, left lower limb: Secondary | ICD-10-CM | POA: Diagnosis not present

## 2018-02-25 DIAGNOSIS — Z1389 Encounter for screening for other disorder: Secondary | ICD-10-CM | POA: Diagnosis not present

## 2018-02-25 DIAGNOSIS — E6609 Other obesity due to excess calories: Secondary | ICD-10-CM | POA: Diagnosis not present

## 2018-02-25 DIAGNOSIS — R69 Illness, unspecified: Secondary | ICD-10-CM | POA: Diagnosis not present

## 2018-02-25 DIAGNOSIS — Z6831 Body mass index (BMI) 31.0-31.9, adult: Secondary | ICD-10-CM | POA: Diagnosis not present

## 2018-03-02 DIAGNOSIS — R69 Illness, unspecified: Secondary | ICD-10-CM | POA: Diagnosis not present

## 2018-03-02 DIAGNOSIS — I10 Essential (primary) hypertension: Secondary | ICD-10-CM | POA: Diagnosis not present

## 2018-03-02 DIAGNOSIS — G2581 Restless legs syndrome: Secondary | ICD-10-CM | POA: Diagnosis not present

## 2018-03-02 DIAGNOSIS — H409 Unspecified glaucoma: Secondary | ICD-10-CM | POA: Diagnosis not present

## 2018-03-02 DIAGNOSIS — Z794 Long term (current) use of insulin: Secondary | ICD-10-CM | POA: Diagnosis not present

## 2018-03-02 DIAGNOSIS — N529 Male erectile dysfunction, unspecified: Secondary | ICD-10-CM | POA: Diagnosis not present

## 2018-03-02 DIAGNOSIS — E669 Obesity, unspecified: Secondary | ICD-10-CM | POA: Diagnosis not present

## 2018-03-02 DIAGNOSIS — F419 Anxiety disorder, unspecified: Secondary | ICD-10-CM | POA: Diagnosis not present

## 2018-03-02 DIAGNOSIS — E1142 Type 2 diabetes mellitus with diabetic polyneuropathy: Secondary | ICD-10-CM | POA: Diagnosis not present

## 2018-03-02 DIAGNOSIS — G8929 Other chronic pain: Secondary | ICD-10-CM | POA: Diagnosis not present

## 2018-03-29 ENCOUNTER — Other Ambulatory Visit: Payer: Self-pay | Admitting: "Endocrinology

## 2018-03-29 DIAGNOSIS — R69 Illness, unspecified: Secondary | ICD-10-CM | POA: Diagnosis not present

## 2018-04-05 DIAGNOSIS — I219 Acute myocardial infarction, unspecified: Secondary | ICD-10-CM

## 2018-04-05 HISTORY — DX: Acute myocardial infarction, unspecified: I21.9

## 2018-04-07 ENCOUNTER — Inpatient Hospital Stay (HOSPITAL_COMMUNITY): Payer: Medicare HMO

## 2018-04-07 ENCOUNTER — Inpatient Hospital Stay (HOSPITAL_COMMUNITY)
Admission: EM | Admit: 2018-04-07 | Discharge: 2018-04-14 | DRG: 246 | Disposition: A | Discharge status: Home / Self Care | Payer: Medicare HMO | Attending: Cardiovascular Disease | Admitting: Cardiovascular Disease

## 2018-04-07 ENCOUNTER — Encounter (HOSPITAL_COMMUNITY)
Admission: EM | Disposition: A | Discharge status: Home / Self Care | Payer: Self-pay | Source: Home / Self Care | Attending: Cardiovascular Disease

## 2018-04-07 ENCOUNTER — Encounter (HOSPITAL_COMMUNITY): Payer: Self-pay | Admitting: *Deleted

## 2018-04-07 ENCOUNTER — Encounter (HOSPITAL_COMMUNITY): Payer: Self-pay

## 2018-04-07 ENCOUNTER — Encounter (HOSPITAL_COMMUNITY): Payer: Self-pay | Admitting: Cardiology

## 2018-04-07 ENCOUNTER — Other Ambulatory Visit (HOSPITAL_COMMUNITY): Payer: Self-pay | Admitting: Pharmacist

## 2018-04-07 DIAGNOSIS — I2510 Atherosclerotic heart disease of native coronary artery without angina pectoris: Secondary | ICD-10-CM

## 2018-04-07 DIAGNOSIS — R0602 Shortness of breath: Secondary | ICD-10-CM | POA: Diagnosis not present

## 2018-04-07 DIAGNOSIS — E1165 Type 2 diabetes mellitus with hyperglycemia: Secondary | ICD-10-CM | POA: Diagnosis not present

## 2018-04-07 DIAGNOSIS — I255 Ischemic cardiomyopathy: Secondary | ICD-10-CM | POA: Diagnosis not present

## 2018-04-07 DIAGNOSIS — K7200 Acute and subacute hepatic failure without coma: Secondary | ICD-10-CM | POA: Diagnosis present

## 2018-04-07 DIAGNOSIS — E1140 Type 2 diabetes mellitus with diabetic neuropathy, unspecified: Secondary | ICD-10-CM | POA: Diagnosis present

## 2018-04-07 DIAGNOSIS — I499 Cardiac arrhythmia, unspecified: Secondary | ICD-10-CM | POA: Diagnosis not present

## 2018-04-07 DIAGNOSIS — M1990 Unspecified osteoarthritis, unspecified site: Secondary | ICD-10-CM | POA: Diagnosis present

## 2018-04-07 DIAGNOSIS — Z794 Long term (current) use of insulin: Secondary | ICD-10-CM | POA: Diagnosis not present

## 2018-04-07 DIAGNOSIS — S301XXA Contusion of abdominal wall, initial encounter: Secondary | ICD-10-CM | POA: Diagnosis not present

## 2018-04-07 DIAGNOSIS — E1169 Type 2 diabetes mellitus with other specified complication: Secondary | ICD-10-CM | POA: Diagnosis present

## 2018-04-07 DIAGNOSIS — I472 Ventricular tachycardia, unspecified: Secondary | ICD-10-CM

## 2018-04-07 DIAGNOSIS — I959 Hypotension, unspecified: Secondary | ICD-10-CM | POA: Diagnosis not present

## 2018-04-07 DIAGNOSIS — I480 Paroxysmal atrial fibrillation: Secondary | ICD-10-CM | POA: Diagnosis not present

## 2018-04-07 DIAGNOSIS — F419 Anxiety disorder, unspecified: Secondary | ICD-10-CM | POA: Diagnosis present

## 2018-04-07 DIAGNOSIS — I2111 ST elevation (STEMI) myocardial infarction involving right coronary artery: Secondary | ICD-10-CM

## 2018-04-07 DIAGNOSIS — I2119 ST elevation (STEMI) myocardial infarction involving other coronary artery of inferior wall: Secondary | ICD-10-CM | POA: Diagnosis present

## 2018-04-07 DIAGNOSIS — I25110 Atherosclerotic heart disease of native coronary artery with unstable angina pectoris: Secondary | ICD-10-CM | POA: Diagnosis not present

## 2018-04-07 DIAGNOSIS — Z7982 Long term (current) use of aspirin: Secondary | ICD-10-CM

## 2018-04-07 DIAGNOSIS — R079 Chest pain, unspecified: Secondary | ICD-10-CM | POA: Diagnosis present

## 2018-04-07 DIAGNOSIS — I10 Essential (primary) hypertension: Secondary | ICD-10-CM | POA: Diagnosis present

## 2018-04-07 DIAGNOSIS — I469 Cardiac arrest, cause unspecified: Secondary | ICD-10-CM | POA: Diagnosis not present

## 2018-04-07 DIAGNOSIS — E785 Hyperlipidemia, unspecified: Secondary | ICD-10-CM | POA: Diagnosis not present

## 2018-04-07 DIAGNOSIS — Z950 Presence of cardiac pacemaker: Secondary | ICD-10-CM

## 2018-04-07 DIAGNOSIS — I213 ST elevation (STEMI) myocardial infarction of unspecified site: Secondary | ICD-10-CM | POA: Diagnosis not present

## 2018-04-07 DIAGNOSIS — G4733 Obstructive sleep apnea (adult) (pediatric): Secondary | ICD-10-CM | POA: Diagnosis present

## 2018-04-07 DIAGNOSIS — I97630 Postprocedural hematoma of a circulatory system organ or structure following a cardiac catheterization: Secondary | ICD-10-CM | POA: Diagnosis not present

## 2018-04-07 DIAGNOSIS — Z8249 Family history of ischemic heart disease and other diseases of the circulatory system: Secondary | ICD-10-CM

## 2018-04-07 DIAGNOSIS — I4901 Ventricular fibrillation: Secondary | ICD-10-CM | POA: Diagnosis not present

## 2018-04-07 DIAGNOSIS — I442 Atrioventricular block, complete: Secondary | ICD-10-CM | POA: Diagnosis not present

## 2018-04-07 DIAGNOSIS — Z955 Presence of coronary angioplasty implant and graft: Secondary | ICD-10-CM | POA: Diagnosis not present

## 2018-04-07 DIAGNOSIS — R001 Bradycardia, unspecified: Secondary | ICD-10-CM | POA: Diagnosis not present

## 2018-04-07 DIAGNOSIS — E7849 Other hyperlipidemia: Secondary | ICD-10-CM | POA: Diagnosis not present

## 2018-04-07 DIAGNOSIS — E118 Type 2 diabetes mellitus with unspecified complications: Secondary | ICD-10-CM

## 2018-04-07 DIAGNOSIS — Z79891 Long term (current) use of opiate analgesic: Secondary | ICD-10-CM | POA: Diagnosis not present

## 2018-04-07 DIAGNOSIS — Z87891 Personal history of nicotine dependence: Secondary | ICD-10-CM | POA: Diagnosis not present

## 2018-04-07 DIAGNOSIS — R0902 Hypoxemia: Secondary | ICD-10-CM | POA: Diagnosis not present

## 2018-04-07 DIAGNOSIS — M549 Dorsalgia, unspecified: Secondary | ICD-10-CM | POA: Diagnosis present

## 2018-04-07 DIAGNOSIS — I4891 Unspecified atrial fibrillation: Secondary | ICD-10-CM | POA: Diagnosis not present

## 2018-04-07 DIAGNOSIS — Z79899 Other long term (current) drug therapy: Secondary | ICD-10-CM

## 2018-04-07 DIAGNOSIS — R069 Unspecified abnormalities of breathing: Secondary | ICD-10-CM | POA: Diagnosis not present

## 2018-04-07 DIAGNOSIS — S301XXD Contusion of abdominal wall, subsequent encounter: Secondary | ICD-10-CM | POA: Diagnosis not present

## 2018-04-07 HISTORY — DX: Acute systolic (congestive) heart failure: I50.21

## 2018-04-07 HISTORY — DX: Ventricular tachycardia: I47.2

## 2018-04-07 HISTORY — DX: ST elevation (STEMI) myocardial infarction of unspecified site: I21.3

## 2018-04-07 HISTORY — DX: Acute myocardial infarction, unspecified: I21.9

## 2018-04-07 HISTORY — DX: Atherosclerotic heart disease of native coronary artery without angina pectoris: I25.10

## 2018-04-07 HISTORY — PX: CORONARY/GRAFT ACUTE MI REVASCULARIZATION: CATH118305

## 2018-04-07 HISTORY — DX: Unspecified atrial fibrillation: I48.91

## 2018-04-07 HISTORY — DX: Ventricular tachycardia, unspecified: I47.20

## 2018-04-07 HISTORY — PX: LEFT HEART CATH AND CORONARY ANGIOGRAPHY: CATH118249

## 2018-04-07 LAB — TROPONIN I
Troponin I: 0.3 ng/mL (ref <0.03)
Troponin I: >65 ng/mL (ref <0.03)

## 2018-04-07 LAB — POCT I-STAT CREATININE: Creatinine, Ser: 1.1 mg/dL (ref 0.61–1.24)

## 2018-04-07 LAB — CBC WITH DIFFERENTIAL/PLATELET
ABS IMMATURE GRANULOCYTES: 0.22 10*3/uL — AB (ref 0.00–0.07)
Basophils Absolute: 0.1 10*3/uL (ref 0.0–0.1)
Basophils Relative: 1 %
Eosinophils Absolute: 0 10*3/uL (ref 0.0–0.5)
Eosinophils Relative: 0 %
HCT: 38 % — ABNORMAL LOW (ref 39.0–52.0)
Hemoglobin: 12.4 g/dL — ABNORMAL LOW (ref 13.0–17.0)
Immature Granulocytes: 2 %
Lymphocytes Relative: 18 %
Lymphs Abs: 2.6 10*3/uL (ref 0.7–4.0)
MCH: 29.8 pg (ref 26.0–34.0)
MCHC: 32.6 g/dL (ref 30.0–36.0)
MCV: 91.3 fL (ref 80.0–100.0)
MONO ABS: 1 10*3/uL (ref 0.1–1.0)
Monocytes Relative: 7 %
NEUTROS ABS: 10.5 10*3/uL — AB (ref 1.7–7.7)
Neutrophils Relative %: 72 %
Platelets: 172 10*3/uL (ref 150–400)
RBC: 4.16 MIL/uL — ABNORMAL LOW (ref 4.22–5.81)
RDW: 13.2 % (ref 11.5–15.5)
WBC: 14.5 10*3/uL — ABNORMAL HIGH (ref 4.0–10.5)
nRBC: 0 % (ref 0.0–0.2)

## 2018-04-07 LAB — PROTIME-INR
INR: 1.3 — ABNORMAL HIGH (ref 0.8–1.2)
PROTHROMBIN TIME: 16 s — AB (ref 11.4–15.2)

## 2018-04-07 LAB — POCT I-STAT EG7
Acid-base deficit: 8 mmol/L — ABNORMAL HIGH (ref 0.0–2.0)
Bicarbonate: 18.1 mmol/L — ABNORMAL LOW (ref 20.0–28.0)
Calcium, Ion: 0.89 mmol/L — CL (ref 1.15–1.40)
HCT: 35 % — ABNORMAL LOW (ref 39.0–52.0)
Hemoglobin: 11.9 g/dL — ABNORMAL LOW (ref 13.0–17.0)
O2 Saturation: 28 %
Potassium: 4.6 mmol/L (ref 3.5–5.1)
Sodium: 141 mmol/L (ref 135–145)
TCO2: 19 mmol/L — ABNORMAL LOW (ref 22–32)
pCO2, Ven: 39.5 mmHg — ABNORMAL LOW (ref 44.0–60.0)
pH, Ven: 7.27 (ref 7.250–7.430)
pO2, Ven: 21 mmHg — CL (ref 32.0–45.0)

## 2018-04-07 LAB — COMPREHENSIVE METABOLIC PANEL
ALK PHOS: 57 U/L (ref 38–126)
ALT: 184 U/L — ABNORMAL HIGH (ref 0–44)
AST: 224 U/L — ABNORMAL HIGH (ref 15–41)
Albumin: 2.5 g/dL — ABNORMAL LOW (ref 3.5–5.0)
Anion gap: 8 (ref 5–15)
BUN: 12 mg/dL (ref 8–23)
CALCIUM: 6.9 mg/dL — AB (ref 8.9–10.3)
CO2: 21 mmol/L — ABNORMAL LOW (ref 22–32)
Chloride: 110 mmol/L (ref 98–111)
Creatinine, Ser: 1.25 mg/dL — ABNORMAL HIGH (ref 0.61–1.24)
GFR calc Af Amer: >60 mL/min (ref >60)
GFR calc non Af Amer: >60 mL/min (ref >60)
Glucose, Bld: 173 mg/dL — ABNORMAL HIGH (ref 70–99)
Potassium: 4.5 mmol/L (ref 3.5–5.1)
Sodium: 139 mmol/L (ref 135–145)
Total Bilirubin: 1.1 mg/dL (ref 0.3–1.2)
Total Protein: 4.5 g/dL — ABNORMAL LOW (ref 6.5–8.1)

## 2018-04-07 LAB — BASIC METABOLIC PANEL
Anion gap: 8 (ref 5–15)
BUN: 21 mg/dL (ref 8–23)
CO2: 18 mmol/L — ABNORMAL LOW (ref 22–32)
Calcium: 7.9 mg/dL — ABNORMAL LOW (ref 8.9–10.3)
Chloride: 105 mmol/L (ref 98–111)
Creatinine, Ser: 1.61 mg/dL — ABNORMAL HIGH (ref 0.61–1.24)
GFR calc Af Amer: 51 mL/min — ABNORMAL LOW (ref >60)
GFR, EST NON AFRICAN AMERICAN: 44 mL/min — AB (ref >60)
GLUCOSE: 170 mg/dL — AB (ref 70–99)
POTASSIUM: 4.3 mmol/L (ref 3.5–5.1)
Sodium: 131 mmol/L — ABNORMAL LOW (ref 135–145)

## 2018-04-07 LAB — POCT ACTIVATED CLOTTING TIME: Activated Clotting Time: 389 seconds

## 2018-04-07 LAB — POCT I-STAT 7, (LYTES, BLD GAS, ICA,H+H)
Acid-base deficit: 18 mmol/L — ABNORMAL HIGH (ref 0.0–2.0)
Bicarbonate: 11.9 mmol/L — ABNORMAL LOW (ref 20.0–28.0)
Calcium, Ion: 0.95 mmol/L — ABNORMAL LOW (ref 1.15–1.40)
HCT: 43 % (ref 39.0–52.0)
Hemoglobin: 14.6 g/dL (ref 13.0–17.0)
O2 Saturation: 93 %
PH ART: 7.069 — AB (ref 7.350–7.450)
PO2 ART: 93 mmHg (ref 83.0–108.0)
Potassium: 3.9 mmol/L (ref 3.5–5.1)
Sodium: 104 mmol/L — CL (ref 135–145)
TCO2: 13 mmol/L — ABNORMAL LOW (ref 22–32)
pCO2 arterial: 41.3 mmHg (ref 32.0–48.0)

## 2018-04-07 LAB — LIPID PANEL
Cholesterol: 122 mg/dL (ref 0–200)
HDL: 28 mg/dL — ABNORMAL LOW (ref >40)
LDL Cholesterol: 76 mg/dL (ref 0–99)
Total CHOL/HDL Ratio: 4.4 RATIO
Triglycerides: 90 mg/dL (ref <150)
VLDL: 18 mg/dL (ref 0–40)

## 2018-04-07 LAB — GLUCOSE, CAPILLARY
GLUCOSE-CAPILLARY: 141 mg/dL — AB (ref 70–99)
GLUCOSE-CAPILLARY: 164 mg/dL — AB (ref 70–99)

## 2018-04-07 LAB — MAGNESIUM: Magnesium: 1.7 mg/dL (ref 1.7–2.4)

## 2018-04-07 LAB — APTT: aPTT: 38 seconds — ABNORMAL HIGH (ref 24–36)

## 2018-04-07 SURGERY — CORONARY/GRAFT ACUTE MI REVASCULARIZATION
Anesthesia: LOCAL

## 2018-04-07 MED ORDER — AMIODARONE HCL IN DEXTROSE 360-4.14 MG/200ML-% IV SOLN
30.0000 mg/h | INTRAVENOUS | Status: DC
  Administered 2018-04-08: 60 mg/h via INTRAVENOUS
  Administered 2018-04-08 (×2): 30 mg/h via INTRAVENOUS
  Filled 2018-04-07 (×2): qty 200

## 2018-04-07 MED ORDER — TICAGRELOR 90 MG PO TABS
ORAL_TABLET | ORAL | Status: AC
  Filled 2018-04-07: qty 2

## 2018-04-07 MED ORDER — LIDOCAINE BOLUS VIA INFUSION
100.0000 mg | Freq: Once | INTRAVENOUS | Status: AC
  Administered 2018-04-07: 100 mg via INTRAVENOUS
  Filled 2018-04-07: qty 100

## 2018-04-07 MED ORDER — DULOXETINE HCL 60 MG PO CPEP
60.0000 mg | ORAL_CAPSULE | Freq: Every evening | ORAL | Status: DC
  Administered 2018-04-07 – 2018-04-13 (×7): 60 mg via ORAL
  Filled 2018-04-07 (×7): qty 1

## 2018-04-07 MED ORDER — SODIUM CHLORIDE 0.9 % IV SOLN
250.0000 mL | INTRAVENOUS | Status: DC | PRN
  Administered 2018-04-07 – 2018-04-14 (×2): 250 mL via INTRAVENOUS

## 2018-04-07 MED ORDER — INSULIN GLARGINE 100 UNIT/ML ~~LOC~~ SOLN
50.0000 [IU] | Freq: Every day | SUBCUTANEOUS | Status: DC
  Filled 2018-04-07: qty 0.5

## 2018-04-07 MED ORDER — NITROGLYCERIN 1 MG/10 ML FOR IR/CATH LAB
INTRA_ARTERIAL | Status: DC | PRN
  Administered 2018-04-07 (×3): 100 ug via INTRACORONARY

## 2018-04-07 MED ORDER — TIROFIBAN HCL IN NACL 5-0.9 MG/100ML-% IV SOLN
0.1500 ug/kg/min | INTRAVENOUS | Status: DC
  Administered 2018-04-07 – 2018-04-08 (×3): 0.15 ug/kg/min via INTRAVENOUS
  Filled 2018-04-07 (×2): qty 100

## 2018-04-07 MED ORDER — SODIUM CHLORIDE 0.9% FLUSH
3.0000 mL | Freq: Two times a day (BID) | INTRAVENOUS | Status: DC
  Administered 2018-04-08: 3 mL via INTRAVENOUS
  Administered 2018-04-09: 10 mL via INTRAVENOUS
  Administered 2018-04-09 – 2018-04-14 (×5): 3 mL via INTRAVENOUS

## 2018-04-07 MED ORDER — HEPARIN (PORCINE) IN NACL 1000-0.9 UT/500ML-% IV SOLN
INTRAVENOUS | Status: DC | PRN
  Administered 2018-04-07: 500 mL

## 2018-04-07 MED ORDER — INSULIN GLARGINE 100 UNIT/ML ~~LOC~~ SOLN
35.0000 [IU] | Freq: Every day | SUBCUTANEOUS | Status: AC
  Administered 2018-04-07 – 2018-04-10 (×4): 35 [IU] via SUBCUTANEOUS
  Filled 2018-04-07 (×6): qty 0.35

## 2018-04-07 MED ORDER — LIDOCAINE HCL (PF) 1 % IJ SOLN
INTRAMUSCULAR | Status: AC
  Filled 2018-04-07: qty 30

## 2018-04-07 MED ORDER — SODIUM CHLORIDE 0.9 % IV SOLN: INTRAVENOUS | Status: DC

## 2018-04-07 MED ORDER — ACETAMINOPHEN 325 MG PO TABS: 650.0000 mg | ORAL_TABLET | ORAL | Status: DC | PRN

## 2018-04-07 MED ORDER — MAGNESIUM SULFATE 2 GM/50ML IV SOLN
2.0000 g | Freq: Once | INTRAVENOUS | Status: AC
  Administered 2018-04-07: 2 g via INTRAVENOUS
  Filled 2018-04-07: qty 50

## 2018-04-07 MED ORDER — AMIODARONE HCL IN DEXTROSE 360-4.14 MG/200ML-% IV SOLN
60.0000 mg/h | INTRAVENOUS | Status: DC
  Administered 2018-04-07: 60 mg/h via INTRAVENOUS
  Filled 2018-04-07: qty 200

## 2018-04-07 MED ORDER — TIROFIBAN (AGGRASTAT) BOLUS VIA INFUSION
INTRAVENOUS | Status: DC | PRN
  Administered 2018-04-07: 2475 ug via INTRAVENOUS

## 2018-04-07 MED ORDER — BASAGLAR KWIKPEN 100 UNIT/ML ~~LOC~~ SOPN: 50.0000 [IU] | PEN_INJECTOR | Freq: Every day | SUBCUTANEOUS | Status: DC

## 2018-04-07 MED ORDER — ONDANSETRON HCL 4 MG/2ML IJ SOLN: 4.0000 mg | Freq: Four times a day (QID) | INTRAMUSCULAR | Status: DC | PRN

## 2018-04-07 MED ORDER — HEPARIN SODIUM (PORCINE) 1000 UNIT/ML IJ SOLN
INTRAMUSCULAR | Status: AC
  Filled 2018-04-07: qty 1

## 2018-04-07 MED ORDER — SODIUM CHLORIDE 0.9 % IV SOLN
INTRAVENOUS | Status: DC | PRN
  Administered 2018-04-07: 1.75 mg/kg/h via INTRAVENOUS

## 2018-04-07 MED ORDER — TICAGRELOR 90 MG PO TABS
90.0000 mg | ORAL_TABLET | Freq: Two times a day (BID) | ORAL | Status: DC
  Administered 2018-04-08 – 2018-04-09 (×4): 90 mg via ORAL
  Filled 2018-04-07 (×4): qty 1

## 2018-04-07 MED ORDER — ROPINIROLE HCL 1 MG PO TABS
1.0000 mg | ORAL_TABLET | Freq: Three times a day (TID) | ORAL | Status: DC
  Administered 2018-04-07 – 2018-04-14 (×20): 1 mg via ORAL
  Filled 2018-04-07 (×22): qty 1

## 2018-04-07 MED ORDER — NITROGLYCERIN 1 MG/10 ML FOR IR/CATH LAB
INTRA_ARTERIAL | Status: AC
  Filled 2018-04-07: qty 10

## 2018-04-07 MED ORDER — TIROFIBAN HCL IN NACL 5-0.9 MG/100ML-% IV SOLN
INTRAVENOUS | Status: AC
  Filled 2018-04-07: qty 100

## 2018-04-07 MED ORDER — HYDRALAZINE HCL 20 MG/ML IJ SOLN: 5.0000 mg | INTRAMUSCULAR | Status: AC | PRN

## 2018-04-07 MED ORDER — ATORVASTATIN CALCIUM 80 MG PO TABS
80.0000 mg | ORAL_TABLET | Freq: Every day | ORAL | Status: DC
  Administered 2018-04-07: 80 mg via ORAL
  Filled 2018-04-07: qty 1

## 2018-04-07 MED ORDER — AMIODARONE HCL IN DEXTROSE 360-4.14 MG/200ML-% IV SOLN
INTRAVENOUS | Status: AC
  Filled 2018-04-07: qty 200

## 2018-04-07 MED ORDER — BIVALIRUDIN TRIFLUOROACETATE 250 MG IV SOLR
INTRAVENOUS | Status: AC
  Filled 2018-04-07: qty 250

## 2018-04-07 MED ORDER — LIDOCAINE HCL (PF) 1 % IJ SOLN
INTRAMUSCULAR | Status: DC | PRN
  Administered 2018-04-07: 10 mL

## 2018-04-07 MED ORDER — HEPARIN (PORCINE) IN NACL 1000-0.9 UT/500ML-% IV SOLN
INTRAVENOUS | Status: AC
  Filled 2018-04-07: qty 1000

## 2018-04-07 MED ORDER — SODIUM CHLORIDE 0.9 % IV SOLN
INTRAVENOUS | Status: AC | PRN
  Administered 2018-04-07: 10 mL/h via INTRAVENOUS

## 2018-04-07 MED ORDER — TIROFIBAN HCL IN NACL 5-0.9 MG/100ML-% IV SOLN
INTRAVENOUS | Status: AC | PRN
  Administered 2018-04-07 (×2): 0.15 ug/kg/min via INTRAVENOUS

## 2018-04-07 MED ORDER — HEPARIN (PORCINE) IN NACL 1000-0.9 UT/500ML-% IV SOLN
INTRAVENOUS | Status: DC | PRN
  Administered 2018-04-07 (×2): 500 mL

## 2018-04-07 MED ORDER — VERAPAMIL HCL 2.5 MG/ML IV SOLN
INTRAVENOUS | Status: AC
  Filled 2018-04-07: qty 2

## 2018-04-07 MED ORDER — AMIODARONE HCL 150 MG/3ML IV SOLN
INTRAVENOUS | Status: AC
  Filled 2018-04-07: qty 3

## 2018-04-07 MED ORDER — AMIODARONE LOAD VIA INFUSION
INTRAVENOUS | Status: DC | PRN
  Administered 2018-04-07 (×2): 150 mg via INTRAVENOUS

## 2018-04-07 MED ORDER — SODIUM CHLORIDE 0.9% FLUSH: 3.0000 mL | INTRAVENOUS | Status: DC | PRN

## 2018-04-07 MED ORDER — PREGABALIN 75 MG PO CAPS
150.0000 mg | ORAL_CAPSULE | Freq: Two times a day (BID) | ORAL | Status: DC
  Administered 2018-04-07 – 2018-04-14 (×14): 150 mg via ORAL
  Filled 2018-04-07 (×14): qty 2

## 2018-04-07 MED ORDER — TIMOLOL MALEATE 0.5 % OP SOLN
1.0000 [drp] | Freq: Every day | OPHTHALMIC | Status: DC
  Administered 2018-04-07 – 2018-04-10 (×4): 1 [drp] via OPHTHALMIC
  Filled 2018-04-07: qty 5

## 2018-04-07 MED ORDER — BIVALIRUDIN BOLUS VIA INFUSION - CUPID
INTRAVENOUS | Status: DC | PRN
  Administered 2018-04-07: 74.25 mg via INTRAVENOUS

## 2018-04-07 MED ORDER — LIDOCAINE HCL (CARDIAC) PF 100 MG/5ML IV SOSY
PREFILLED_SYRINGE | INTRAVENOUS | Status: DC | PRN
  Administered 2018-04-07: 5 mL via INTRAVENOUS

## 2018-04-07 MED ORDER — TICAGRELOR 90 MG PO TABS
ORAL_TABLET | ORAL | Status: DC | PRN
  Administered 2018-04-07: 180 mg via ORAL

## 2018-04-07 MED ORDER — LIDOCAINE BOLUS VIA INFUSION
50.0000 mg | Freq: Once | INTRAVENOUS | Status: AC
  Administered 2018-04-07: 50 mg via INTRAVENOUS
  Filled 2018-04-07: qty 52

## 2018-04-07 MED ORDER — LIDOCAINE HCL (CARDIAC) PF 100 MG/5ML IV SOSY
PREFILLED_SYRINGE | INTRAVENOUS | Status: AC
  Filled 2018-04-07: qty 5

## 2018-04-07 MED ORDER — LABETALOL HCL 5 MG/ML IV SOLN: 10.0000 mg | INTRAVENOUS | Status: AC | PRN

## 2018-04-07 MED ORDER — OXYCODONE HCL 5 MG PO TABS
5.0000 mg | ORAL_TABLET | ORAL | Status: DC | PRN
  Administered 2018-04-08 – 2018-04-09 (×2): 10 mg via ORAL
  Filled 2018-04-07 (×2): qty 2

## 2018-04-07 MED ORDER — MORPHINE SULFATE (PF) 2 MG/ML IV SOLN: 1.0000 mg | INTRAVENOUS | Status: DC | PRN

## 2018-04-07 MED ORDER — LIDOCAINE IN D5W 4-5 MG/ML-% IV SOLN
1.0000 mg/min | INTRAVENOUS | Status: AC
  Administered 2018-04-07: 1 mg/min via INTRAVENOUS
  Filled 2018-04-07: qty 500

## 2018-04-07 MED ORDER — IOHEXOL 350 MG/ML SOLN
INTRAVENOUS | Status: DC | PRN
  Administered 2018-04-07: 125 mL via INTRA_ARTERIAL

## 2018-04-07 MED ORDER — AMIODARONE HCL IN DEXTROSE 360-4.14 MG/200ML-% IV SOLN
INTRAVENOUS | Status: AC | PRN
  Administered 2018-04-07: 60 mg/h via INTRAVENOUS

## 2018-04-07 MED ORDER — ASPIRIN EC 81 MG PO TBEC
81.0000 mg | DELAYED_RELEASE_TABLET | Freq: Every day | ORAL | Status: DC
  Administered 2018-04-08 – 2018-04-14 (×7): 81 mg via ORAL
  Filled 2018-04-07 (×7): qty 1

## 2018-04-07 SURGICAL SUPPLY — 26 items
BALLN SAPPHIRE 2.5X15 (BALLOONS) ×2
BALLN SAPPHIRE ~~LOC~~ 4.0X18 (BALLOONS) ×1 IMPLANT
BALLN ~~LOC~~ EMERGE MR 3.75X20 (BALLOONS) ×2
BALLOON SAPPHIRE 2.5X15 (BALLOONS) IMPLANT
BALLOON ~~LOC~~ EMERGE MR 3.75X20 (BALLOONS) IMPLANT
CATH EXTRAC PRONTO 5.5F 138CM (CATHETERS) ×1 IMPLANT
CATH INFINITI 5FR MULTPACK ANG (CATHETERS) ×1 IMPLANT
CATH INFINITI JR4 5F (CATHETERS) ×1 IMPLANT
CATH LAUNCHER 6FR JR4 (CATHETERS) ×1 IMPLANT
CATH S G BIP PACING (CATHETERS) ×1 IMPLANT
ELECT DEFIB PAD ADLT CADENCE (PAD) ×1 IMPLANT
GLIDESHEATH SLEND SS 6F .021 (SHEATH) IMPLANT
GUIDEWIRE INQWIRE 1.5J.035X260 (WIRE) IMPLANT
INQWIRE 1.5J .035X260CM (WIRE)
KIT ENCORE 26 ADVANTAGE (KITS) ×1 IMPLANT
KIT HEART LEFT (KITS) ×3 IMPLANT
PACK CARDIAC CATHETERIZATION (CUSTOM PROCEDURE TRAY) ×3 IMPLANT
SHEATH PINNACLE 5F 10CM (SHEATH) IMPLANT
SHEATH PINNACLE 6F 10CM (SHEATH) ×3 IMPLANT
SHEATH PROBE COVER 6X72 (BAG) ×1 IMPLANT
SLEEVE REPOSITIONING LENGTH 30 (MISCELLANEOUS) ×1 IMPLANT
STENT RESOLUTE ONYX 3.5X30 (Permanent Stent) ×1 IMPLANT
TRANSDUCER W/STOPCOCK (MISCELLANEOUS) ×3 IMPLANT
TUBING CIL FLEX 10 FLL-RA (TUBING) ×2 IMPLANT
WIRE COUGAR XT STRL 190CM (WIRE) ×1 IMPLANT
WIRE EMERALD 3MM-J .035X150CM (WIRE) ×2 IMPLANT

## 2018-04-07 NOTE — H&P (Addendum)
Cardiology Admission History and Physical:   Patient ID: Xavier Caldwell MRN: [id number]; DOB: 1951-01-25   Admission date: 04/07/2018  Primary Care Provider: Redmond School, MD Primary Cardiologist: No primary care provider on file.  Primary Electrophysiologist:  None   Chief Complaint:  Weakness   Patient Profile:   Xavier Caldwell is a 68 y.o. male with PMH of HL, HTN, IDDM, and OSA who presented with chest pain and CODE STEMI called in the field.   History of Present Illness:   Xavier Caldwell is a 68 yo male with PMH of HL, HTN, IDDM, and OSA. He is followed by his PCP and endocrinology for his DM. He is on metformin 1000mg  BID as well as insulin. Hgb A1c last noted at 6.6. Also on Zocor with last LDL of 32 (12/18).   Reports developing onset of weakness and nausea while at [facility] this morning with is wife. Wife thought this may have been related to his blood sugar. Took him home and gave him a candy bar which did not help. Denied any chest pain. On EMS arrival his HR was noted in the 30. EKG with inferior elevation and HR of 31. Given ASA an zofran with EMS prior to arrival. Code STEMI called in the field. He was brought directly to the cath lab for emergent cardiac cath.    Past Medical History:  Diagnosis Date  . Anxiety   . Arthritis   . Back pain   . Diabetes mellitus without complication ([place])   . High cholesterol   . History of gout   . Hypertension   . Neuropathy   . 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 Sleep apnea    Stop Bang score of 4    Past Surgical History:  Procedure Laterality Date  . APPENDECTOMY    . CATARACT EXTRACTION W/PHACO Right 04/25/2014   Procedure: CATARACT EXTRACTION PHACO AND INTRAOCULAR LENS PLACEMENT (IOC);  Surgeon: Williams Che, MD;  Location: [facility];  Service: Ophthalmology;  Laterality: Right;  CDE:4.70  . CATARACT EXTRACTION W/PHACO Left 07/11/2014   Procedure: CATARACT EXTRACTION PHACO AND INTRAOCULAR LENS  PLACEMENT LEFT EYE CDE=8.32;  Surgeon: Williams Che, MD;  Location: [facility];  Service: Ophthalmology;  Laterality: Left;  . COLONOSCOPY WITH PROPOFOL N/A 09/23/2016   Procedure: COLONOSCOPY WITH PROPOFOL;  Surgeon: Daneil Dolin, MD;  Location: [facility];  Service: Endoscopy;  Laterality: N/A;  . POLYPECTOMY  09/23/2016   Procedure: POLYPECTOMY;  Surgeon: Daneil Dolin, MD;  Location: [facility];  Service: Endoscopy;;  colon  . RETINAL DETACHMENT SURGERY       Medications Prior to Admission: Prior to Admission medications   Medication Sig Start Date End Date Taking? Authorizing Provider  ALPRAZolam Duanne Moron) 0.5 MG tablet Take 0.5 mg by mouth 2 (two) times daily as needed for anxiety.    [provider]  aspirin EC 81 MG tablet Take 81 mg by mouth daily.    [provider]  B Complex-C (SUPER B COMPLEX PO) Take 1 tablet by mouth daily.     [provider]  DULoxetine (CYMBALTA) 60 MG capsule Take 60 mg by mouth every evening.     [provider]  ergocalciferol (VITAMIN D2) 50000 units capsule Take 50,000 Units by mouth once a week. Tuesdays    [provider]  glucose blood test strip 1 each by Other route 2 (two) times daily. Use  as instructed 2 x daily, One touch Ultra 12/31/17   Cassandria Anger, MD  HYDROcodone-acetaminophen (NORCO/VICODIN) 5-325 MG per tablet Take 1 tablet by mouth every 6 (six) hours as needed for moderate pain.    [provider]  Insulin Glargine (BASAGLAR KWIKPEN) 100 UNIT/ML SOPN INJECT 50 UNITS SUBCUTANEOUSLY AT BEDTIME 12/31/17   Cassandria Anger, MD  lidocaine (LIDODERM) 5 % Place 1 patch onto the skin daily as needed (pain). Remove & Discard patch within 12 hours or as directed by MD     [provider]  lisinopril (PRINIVIL,ZESTRIL) 40 MG tablet Take 40 mg by mouth daily.    [provider]  metFORMIN (GLUCOPHAGE) 1000 MG tablet Take 1 tablet (1,000 mg total) by mouth 2  (two) times daily. 08/08/16   Cassandria Anger, MD  metFORMIN (GLUCOPHAGE) 1000 MG tablet TAKE 1 TABLET BY MOUTH TWICE DAILY 03/30/18   Cassandria Anger, MD  naloxegol oxalate (MOVANTIK) 25 MG TABS tablet Take 25 mg by mouth daily.     [provider]  pregabalin (LYRICA) 75 MG capsule Take 75-150 mg by mouth 2 (two) times daily. Takes 150mg  in the morning and 150mg  in the evening.    [provider]  rOPINIRole (REQUIP) 1 MG tablet Take 1 mg by mouth 3 (three) times daily.    [provider]  simvastatin (ZOCOR) 20 MG tablet Take 20 mg by mouth at bedtime.     [provider]  timolol (TIMOPTIC) 0.5 % ophthalmic solution Place 1 drop into the left eye daily. 06/13/16   [provider]  UNIFINE PENTIPS 31G X 5 MM MISC USE TO INJECT INSULIN AT BEDTIME 06/10/16   Cassandria Anger, MD     Allergies:   No Known Allergies  Social History:   Social History   Socioeconomic History  . Marital status: Married    Spouse name: Not on file  . Number of children: Not on file  . Years of education: Not on file  . Highest education level: Not on file  Occupational History  . Not on file  Social Needs  . Financial resource strain: Not on file  . Food insecurity:    Worry: Not on file    Inability: Not on file  . Transportation needs:    Medical: Not on file    Non-medical: Not on file  Tobacco Use  . Smoking status: Former Smoker    Packs/day: 1.50    Years: 30.00    Pack years: 45.00    Types: Cigarettes    Last attempt to quit: 07/05/2012    Years since quitting: 5.7  . Smokeless tobacco: Never Used  Substance and Sexual Activity  . Alcohol use: No  . Drug use: No  . Sexual activity: Never    Birth control/protection: None  Lifestyle  . Physical activity:    Days per week: Not on file    Minutes per session: Not on file  . Stress: Not on file  Relationships  . Social connections:    Talks on phone: Not on file    Gets  together: Not on file    Attends religious service: Not on file    Active member of club or organization: Not on file    Attends meetings of clubs or organizations: Not on file    Relationship status: Not on file  . Intimate partner violence:    Fear of current or ex partner: Not on file  Emotionally abused: Not on file    Physically abused: Not on file    Forced sexual activity: Not on file  Other Topics Concern  . Not on file  Social History Narrative  . Not on file    Family History:   The patient's family history includes Hypertension in his father. There is no history of Colon cancer.    ROS:  Please see the history of present illness.  All other ROS reviewed and negative.     Physical Exam/Data:  There were no vitals filed for this visit. No intake or output data in the 24 hours ending 04/07/18 1349 Last 3 Weights 12/23/2017 08/21/2017 04/21/2017  Weight (lbs) 219 lb 218 lb 214 lb  Weight (kg) 99.338 kg 98.884 kg 97.07 kg     There is no height or weight on file to calculate BMI.  General: ill appearing older WM, pale and diaphoretic HEENT: normal Neck: no JVD Cardiac:  normal S1, S2; bradycardiac; no murmur Lungs:  clear to auscultation bilaterally, no wheezing, rhonchi or rales  Abd: soft, nontender, no hepatomegaly  Ext: no edema Musculoskeletal:  No deformities, BUE and BLE strength normal and equal Skin: pale and diaphoretic Neuro:  CNs 2-12 intact, no focal abnormalities noted Psych:  Normal affect   Relevant CV Studies:  Laboratory Data:  ChemistryNo results for input(s): NA, K, CL, CO2, GLUCOSE, BUN, CREATININE, CALCIUM, GFRNONAA, GFRAA, ANIONGAP in the last 168 hours.  No results for input(s): PROT, ALBUMIN, AST, ALT, ALKPHOS, BILITOT in the last 168 hours. HematologyNo results for input(s): WBC, RBC, HGB, HCT, MCV, MCH, MCHC, RDW, PLT in the last 168 hours. Cardiac EnzymesNo results for input(s): TROPONINI in the last 168 hours. No results for  input(s): TROPIPOC in the last 168 hours.  BNPNo results for input(s): BNP, PROBNP in the last 168 hours.  DDimer No results for input(s): DDIMER in the last 168 hours.  Radiology/Studies:  No results found.  Assessment and Plan:   Xavier Caldwell is a 68 y.o. male with PMH of HL, HTN, IDDM, and OSA who presented with chest pain and CODE STEMI called in the field.   1. Acute inferior STEMI: developed onset of weakness while in [place]. Thought this was related to low blood sugar. On EMS arrival found to have acute ST elevation in inferior leads. Given ASA and Zofran PTA.  -- further recommendation following cath  2. IDDM: on both metformin and insulin.  -- hold metformin, start SSI while inpatient  3. HL:  on zocor -- plan to switch to high dose statin  4. HTN: hold home medications as he is hypotensive while in the cath lab.   Severity of Illness: The appropriate patient status for this patient is INPATIENT. Inpatient status is judged to be reasonable and necessary in order to provide the required intensity of service to ensure the patient's safety. The patient's presenting symptoms, physical exam findings, and initial radiographic and laboratory data in the context of their chronic comorbidities is felt to place them at high risk for further clinical deterioration. Furthermore, it is not anticipated that the patient will be medically stable for discharge from the hospital within 2 midnights of admission. The following factors support the patient status of inpatient.   " The patient's presenting symptoms include weakness, nausea. " The worrisome physical exam findings include pale, diaphoretic. " The initial radiographic and laboratory data are worrisome because of EKG with inferior STEMI and bradycardiac. " The chronic co-morbidities include IDDM, HTN,  HL.   * I certify that at the point of admission it is my clinical judgment that the patient will require inpatient hospital care  spanning beyond 2 midnights from the point of admission due to high intensity of service, high risk for further deterioration and high frequency of surveillance required.*    For questions or updates, please contact Dana Please consult [email or web address] for contact info under    Signed, Reino Bellis, NP  04/07/2018 1:49 PM   I have personally seen and examined this patient with Reino Bellis, NP. I agree with the assessment and plan as outlined above. Pt presenting in shock with inferior STEMI, bradycardia, hypotension and weakness. Plan emergent cardiac cath with PCI. Further plans to follow after cath.   Lauree Chandler 04/07/2018 3:46 PM

## 2018-04-07 NOTE — Progress Notes (Signed)
ANTICOAGULATION CONSULT NOTE - Initial Consult  Pharmacy Consult for aggrastat Indication: chest pain/ACS  No Known Allergies  Patient Measurements: Weight: 219 lb 9.3 oz (99.6 kg) Heparin Dosing Weight: 95.8 kg  Vital Signs: BP: 127/76 (03/03 1700) Pulse Rate: 65 (03/03 1715)  Labs: Recent Labs    04/07/18 1349 04/07/18 1358 04/07/18 1414  HGB 12.4*  --  14.6  HCT 38.0*  --  43.0  PLT 172  --   --   APTT 38*  --   --   LABPROT 16.0*  --   --   INR 1.3*  --   --   CREATININE 1.25* 1.10  --   TROPONINI 0.30*  --   --     Estimated Creatinine Clearance: 80.5 mL/min (by C-G formula based on SCr of 1.1 mg/dL).   Medical History: Past Medical History:  Diagnosis Date  . Anxiety   . Arthritis   . Back pain   . Diabetes mellitus without complication ([place])   . High cholesterol   . History of gout   . Hypertension   . Neuropathy   . 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 Sleep apnea    Stop Bang score of 4    Medications:  Scheduled:  . [START ON 04/08/2018] aspirin EC  81 mg Oral Daily  . atorvastatin  80 mg Oral q1800  . BASAGLAR KWIKPEN  50 Units Subcutaneous Q2200  . DULoxetine  60 mg Oral QPM  . pregabalin  150 mg Oral BID  . rOPINIRole  1 mg Oral TID  . sodium chloride flush  3 mL Intravenous Q12H  . [START ON 04/08/2018] ticagrelor  90 mg Oral BID  . timolol  1 drop Left Eye Daily   Infusions:  . sodium chloride 75 mL/hr at 04/07/18 1700  . sodium chloride 10 mL/hr at 04/07/18 1600  . amiodarone 60 mg/hr (04/07/18 1700)  . amiodarone    . tirofiban      Assessment: 32 yom presenting as code STEMI, found to have an acute inferior STEMI now s/p DES to proximal RCA. Underwent ventricular fibrillation. Plan to continue aggrastat for 18 hours post-cath (received bolus on 3/3@1359 ). No anticoagulation PTA.  Hgb 14.6, plt 172. Troponin 0.3. Scr 1.1 (CrCl 80 mL/min).   Goal of Therapy:  Monitor platelets by anticoagulation  protocol: Yes   Plan:  Start tirofiban (Aggrastat) at 0.15 mcg/kg/min for 18 hours Monitor CBC and for s/sx of bleeding  Antonietta Jewel, PharmD, [place] Clinical Pharmacist  Pager: [phone] Phone: [phone] 04/07/2018,5:38 PM

## 2018-04-07 NOTE — Care Plan (Addendum)
Admitted today s/p inferior STEMI c/b VF arrest with ROSC and bradycardia, s/p PCI to RCA culprit and placement of TVPM with base rate 60 bpm. On amiodarone ggt.   Paged regarding frequent ventricular ectopy/NSVT.  Feels well, hemodynamically stable.  Assessment/Plan: 1. VF arrest, still with frequent NSVT post-reperfusion. Multifocal, though one dominant focus appears to be from RV apex, possibly related to pacing wire. - Start lidocaine with bolus+infusion - Continue amiodarone 1mg /min until suppressed, then 0.5mg /min - Check electrolytes - Will consider pulling TVP wire if ongoing.  2. Bradycardia post-arrest/MI s/p TVP placement. Now NSR without any bradyarrhythmias. Occasional undersensing with R-on-T paced beats. - Base rate decreased to 40 bpm - Sensing threshold lowered to 0.8 mV  - CXR to confirm placement - Low threshold to discontinue pacing if continued undersensing

## 2018-04-08 ENCOUNTER — Encounter (HOSPITAL_COMMUNITY): Payer: Self-pay | Admitting: Cardiovascular Disease

## 2018-04-08 ENCOUNTER — Inpatient Hospital Stay (HOSPITAL_COMMUNITY): Payer: Medicare HMO

## 2018-04-08 DIAGNOSIS — I2119 ST elevation (STEMI) myocardial infarction involving other coronary artery of inferior wall: Secondary | ICD-10-CM

## 2018-04-08 DIAGNOSIS — R001 Bradycardia, unspecified: Secondary | ICD-10-CM | POA: Diagnosis present

## 2018-04-08 DIAGNOSIS — I25110 Atherosclerotic heart disease of native coronary artery with unstable angina pectoris: Secondary | ICD-10-CM

## 2018-04-08 DIAGNOSIS — I10 Essential (primary) hypertension: Secondary | ICD-10-CM

## 2018-04-08 DIAGNOSIS — Z794 Long term (current) use of insulin: Secondary | ICD-10-CM

## 2018-04-08 DIAGNOSIS — E1165 Type 2 diabetes mellitus with hyperglycemia: Secondary | ICD-10-CM

## 2018-04-08 DIAGNOSIS — I472 Ventricular tachycardia, unspecified: Secondary | ICD-10-CM

## 2018-04-08 DIAGNOSIS — E1169 Type 2 diabetes mellitus with other specified complication: Secondary | ICD-10-CM

## 2018-04-08 DIAGNOSIS — Z955 Presence of coronary angioplasty implant and graft: Secondary | ICD-10-CM

## 2018-04-08 DIAGNOSIS — E118 Type 2 diabetes mellitus with unspecified complications: Secondary | ICD-10-CM

## 2018-04-08 DIAGNOSIS — E785 Hyperlipidemia, unspecified: Secondary | ICD-10-CM

## 2018-04-08 LAB — POCT I-STAT 7, (LYTES, BLD GAS, ICA,H+H)
Acid-base deficit: 2 mmol/L (ref 0.0–2.0)
Bicarbonate: 21.3 mmol/L (ref 20.0–28.0)
Calcium, Ion: 1.15 mmol/L (ref 1.15–1.40)
HCT: 38 % — ABNORMAL LOW (ref 39.0–52.0)
Hemoglobin: 12.9 g/dL — ABNORMAL LOW (ref 13.0–17.0)
O2 Saturation: 94 %
PO2 ART: 69 mmHg — AB (ref 83.0–108.0)
Patient temperature: 98.7
Potassium: 4.2 mmol/L (ref 3.5–5.1)
Sodium: 135 mmol/L (ref 135–145)
TCO2: 22 mmol/L (ref 22–32)
pCO2 arterial: 32.4 mmHg (ref 32.0–48.0)
pH, Arterial: 7.427 (ref 7.350–7.450)

## 2018-04-08 LAB — HEPATIC FUNCTION PANEL
ALT: 554 U/L — ABNORMAL HIGH (ref 0–44)
AST: 701 U/L — ABNORMAL HIGH (ref 15–41)
Albumin: 3 g/dL — ABNORMAL LOW (ref 3.5–5.0)
Alkaline Phosphatase: 17 U/L — ABNORMAL LOW (ref 38–126)
Total Bilirubin: 0.2 mg/dL — ABNORMAL LOW (ref 0.3–1.2)
Total Protein: 5.2 g/dL — ABNORMAL LOW (ref 6.5–8.1)

## 2018-04-08 LAB — ECHOCARDIOGRAM COMPLETE
Height: 71 in
Weight: 3686.09 oz

## 2018-04-08 LAB — BASIC METABOLIC PANEL
Anion gap: 9 (ref 5–15)
BUN: 29 mg/dL — ABNORMAL HIGH (ref 8–23)
CALCIUM: 8.3 mg/dL — AB (ref 8.9–10.3)
CO2: 21 mmol/L — ABNORMAL LOW (ref 22–32)
Chloride: 105 mmol/L (ref 98–111)
Creatinine, Ser: 1.86 mg/dL — ABNORMAL HIGH (ref 0.61–1.24)
GFR calc Af Amer: 43 mL/min — ABNORMAL LOW (ref >60)
GFR calc non Af Amer: 37 mL/min — ABNORMAL LOW (ref >60)
Glucose, Bld: 183 mg/dL — ABNORMAL HIGH (ref 70–99)
Potassium: 4.2 mmol/L (ref 3.5–5.1)
Sodium: 135 mmol/L (ref 135–145)

## 2018-04-08 LAB — CBC
HCT: 39.1 % (ref 39.0–52.0)
Hemoglobin: 12.8 g/dL — ABNORMAL LOW (ref 13.0–17.0)
MCH: 29.3 pg (ref 26.0–34.0)
MCHC: 32.7 g/dL (ref 30.0–36.0)
MCV: 89.5 fL (ref 80.0–100.0)
Platelets: 175 10*3/uL (ref 150–400)
RBC: 4.37 MIL/uL (ref 4.22–5.81)
RDW: 13.2 % (ref 11.5–15.5)
WBC: 13.5 10*3/uL — ABNORMAL HIGH (ref 4.0–10.5)
nRBC: 0 % (ref 0.0–0.2)

## 2018-04-08 LAB — TROPONIN I
Troponin I: >65 ng/mL (ref <0.03)
Troponin I: >65 ng/mL (ref <0.03)

## 2018-04-08 LAB — GLUCOSE, CAPILLARY
GLUCOSE-CAPILLARY: 142 mg/dL — AB (ref 70–99)
Glucose-Capillary: 174 mg/dL — ABNORMAL HIGH (ref 70–99)
Glucose-Capillary: 220 mg/dL — ABNORMAL HIGH (ref 70–99)
Glucose-Capillary: 175 mg/dL — ABNORMAL HIGH (ref 70–99)
Glucose-Capillary: 161 mg/dL — ABNORMAL HIGH (ref 70–99)

## 2018-04-08 LAB — MAGNESIUM: Magnesium: 2.3 mg/dL (ref 1.7–2.4)

## 2018-04-08 MED ORDER — SODIUM CHLORIDE 0.9% FLUSH: 10.0000 mL | INTRAVENOUS | Status: DC | PRN

## 2018-04-08 MED ORDER — INSULIN ASPART 100 UNIT/ML ~~LOC~~ SOLN
0.0000 [IU] | Freq: Every day | SUBCUTANEOUS | Status: DC
  Administered 2018-04-09: 2 [IU] via SUBCUTANEOUS

## 2018-04-08 MED ORDER — CHLORHEXIDINE GLUCONATE CLOTH 2 % EX PADS
6.0000 | MEDICATED_PAD | Freq: Every day | CUTANEOUS | Status: DC
  Administered 2018-04-09 – 2018-04-13 (×5): 6 via TOPICAL

## 2018-04-08 MED ORDER — INSULIN ASPART 100 UNIT/ML ~~LOC~~ SOLN
0.0000 [IU] | Freq: Three times a day (TID) | SUBCUTANEOUS | Status: DC
  Administered 2018-04-08: 5 [IU] via SUBCUTANEOUS
  Administered 2018-04-08 – 2018-04-09 (×3): 3 [IU] via SUBCUTANEOUS
  Administered 2018-04-09 – 2018-04-10 (×3): 2 [IU] via SUBCUTANEOUS
  Administered 2018-04-11: 3 [IU] via SUBCUTANEOUS
  Administered 2018-04-12 – 2018-04-13 (×2): 5 [IU] via SUBCUTANEOUS
  Administered 2018-04-14: 3 [IU] via SUBCUTANEOUS

## 2018-04-08 MED ORDER — SODIUM CHLORIDE 0.9% FLUSH
10.0000 mL | Freq: Two times a day (BID) | INTRAVENOUS | Status: DC
  Administered 2018-04-08 – 2018-04-09 (×2): 10 mL

## 2018-04-08 MED ORDER — METOPROLOL TARTRATE 12.5 MG HALF TABLET
12.5000 mg | ORAL_TABLET | Freq: Two times a day (BID) | ORAL | Status: DC
  Administered 2018-04-08 (×2): 12.5 mg via ORAL
  Filled 2018-04-08 (×2): qty 1

## 2018-04-08 MED ORDER — CHLORHEXIDINE GLUCONATE CLOTH 2 % EX PADS
6.0000 | MEDICATED_PAD | Freq: Every day | CUTANEOUS | Status: DC
  Administered 2018-04-08 – 2018-04-12 (×3): 6 via TOPICAL

## 2018-04-08 MED FILL — Amiodarone HCl Inj 150 MG/3ML (50 MG/ML): INTRAVENOUS | Qty: 6 | Status: AC

## 2018-04-08 NOTE — Progress Notes (Addendum)
Progress Note  Patient Name: Xavier Caldwell Date of Encounter: 04/08/2018  Primary Cardiologist: No primary care provider on file.   Subjective   No chest pain. Feeling pretty well this morning.   Inpatient Medications    Scheduled Meds: . aspirin EC  81 mg Oral Daily  . Chlorhexidine Gluconate Cloth  6 each Topical Daily  . Chlorhexidine Gluconate Cloth  6 each Topical Daily  . DULoxetine  60 mg Oral QPM  . insulin aspart  0-15 Units Subcutaneous TID WC  . insulin aspart  0-5 Units Subcutaneous QHS  . insulin glargine  35 Units Subcutaneous QHS  . metoprolol tartrate  12.5 mg Oral BID  . pregabalin  150 mg Oral BID  . rOPINIRole  1 mg Oral TID  . sodium chloride flush  10-40 mL Intracatheter Q12H  . sodium chloride flush  3 mL Intravenous Q12H  . ticagrelor  90 mg Oral BID  . timolol  1 drop Left Eye Daily   Continuous Infusions: . sodium chloride 10 mL/hr at 04/08/18 0600  . amiodarone 30 mg/hr (04/08/18 0800)  . lidocaine 1 mg/min (04/08/18 0600)   PRN Meds: sodium chloride, acetaminophen, morphine injection, ondansetron (ZOFRAN) IV, oxyCODONE, sodium chloride flush, sodium chloride flush   Vital Signs    Vitals:   04/08/18 0400 04/08/18 0500 04/08/18 0600 04/08/18 0819  BP: 133/72 136/70 124/84   Pulse: 63 61 62   Resp: 16 (!) 22 19   Temp: 98 F (36.7 C)   (!) 95.8 F (35.4 C)  TempSrc: Oral   Axillary  SpO2: 97% 92% 94%   Weight:   104.5 kg   Height:        Intake/Output Summary (Last 24 hours) at 04/08/2018 0823 Last data filed at 04/08/2018 0600 Gross per 24 hour  Intake 2199.21 ml  Output 200 ml  Net 1999.21 ml   Last 3 Weights 04/08/2018 04/07/2018 12/23/2017  Weight (lbs) 230 lb 6.1 oz 219 lb 9.3 oz 219 lb  Weight (kg) 104.5 kg 99.6 kg 99.338 kg      Telemetry    SR with short runs of NSVT (slowed from the evening) --ectopy seems to support significantly- Personally Reviewed  ECG    SR with inferior q waves - Personally Reviewed  Physical  Exam   GEN: No acute distress.  Resting comfortably in bed Neck: No JVD.  No carotid bruit Cardiac: RRR, normal S1-S2.  No murmurs, rubs, or gallops.  Respiratory: Clear to auscultation bilaterally.  Nonlabored GI: Soft, nontender, non-distended  MS: No edema; No deformity. Right groin stable with sheath in place. + pedal pulse Neuro:  Nonfocal  Psych: Normal affect   Labs    Chemistry Recent Labs  Lab 04/07/18 1349 04/07/18 1358  04/07/18 2050 04/08/18 0027 04/08/18 0405  NA 139 141   < > 131* 135 135  K 4.5 4.6   < > 4.3 4.2 4.2  CL 110  --   --  105  --  105  CO2 21*  --   --  18*  --  21*  GLUCOSE 173*  --   --  170*  --  183*  BUN 12  --   --  21  --  29*  CREATININE 1.25* 1.10  --  1.61*  --  1.86*  CALCIUM 6.9*  --   --  7.9*  --  8.3*  PROT 4.5*  --   --   --   --  5.2*  ALBUMIN 2.5*  --   --   --   --  3.0*  AST 224*  --   --   --   --  701*  ALT 184*  --   --   --   --  554*  ALKPHOS 57  --   --   --   --  17*  BILITOT 1.1  --   --   --   --  0.2*  GFRNONAA >60  --   --  44*  --  37*  GFRAA >60  --   --  51*  --  43*  ANIONGAP 8  --   --  8  --  9   < > = values in this interval not displayed.     Hematology Recent Labs  Lab 04/07/18 1349  04/07/18 1414 04/08/18 0027 04/08/18 0405  WBC 14.5*  --   --   --  13.5*  RBC 4.16*  --   --   --  4.37  HGB 12.4*   < > 14.6 12.9* 12.8*  HCT 38.0*   < > 43.0 38.0* 39.1  MCV 91.3  --   --   --  89.5  MCH 29.8  --   --   --  29.3  MCHC 32.6  --   --   --  32.7  RDW 13.2  --   --   --  13.2  PLT 172  --   --   --  175   < > = values in this interval not displayed.    Cardiac Enzymes Recent Labs  Lab 04/07/18 1349 04/07/18 2050 04/08/18 0405  TROPONINI 0.30* >65.00* >65.00*   No results for input(s): TROPIPOC in the last 168 hours.   BNPNo results for input(s): BNP, PROBNP in the last 168 hours.   DDimer No results for input(s): DDIMER in the last 168 hours.   Radiology    Dg Chest 1  View  Result Date: 04/07/2018 CLINICAL DATA:  Transvenous pacer placement EXAM: CHEST  1 VIEW COMPARISON:  11/18/2016 FINDINGS: Mild cardiomegaly. Ascending pacer lead with tip projecting over the right ventricle. Lung fields are clear. No pneumothorax. IMPRESSION: Ascending pacer lead with tip projecting over the right ventricle. Mild cardiomegaly. Electronically Signed   By: Donavan Foil M.D.   On: 04/07/2018 22:09    Cardiac Studies   Cath: 04/07/2018   Prox RCA lesion is 100% stenosed.  Ost LAD to Prox LAD lesion is 30% stenosed.  Mid LAD lesion is 40% stenosed.  Ost 2nd Mrg to 2nd Mrg lesion is 40% stenosed.  A drug-eluting stent was successfully placed using a STENT RESOLUTE ONYX 3.5X30.  Post intervention, there is a 0% residual stenosis.   1. 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 Ventricular fibrillation/cardiac arrest with successful resuscitation 3. Successful PTCA/DES x 1 proximal RCA.  4. Successful aspiration thrombectomy distal RCA 5. Mild to moderate non-obstructive disease in the Circumflex and LAD  Recommendations: Will admit to ICU. Echo in the am. Will continue Aggrastat for 18 hours. Will continue ASA/Brilinta and statin. Will not start a beta blocker today given hypotension and bradycardia. Ace inh on hold with hypotension.   Patient Profile     68 y.o. male PMH of HL, HTN, IDDM, and OSA who presented with chest pain and CODE STEMI called in the field. Underwent cardiac cath noted above.   Assessment & Plan  1. Inferior STEMI: Underwent PCI/DES x1 to the pRCA yesterday. Continued on aggrastat for 18hrs -due to stop at 8 AM.   On DAPT with ASA/Brilinta.   We will start low dose BB today,   hold statin in the setting of elevated LFTs -- echo pending  2. Bradycardia: Ischemic related.  Temp wire in place, but no paced beats on telemetry. Rates mostly in the 34s.   Will plan to remove temp wire today.   Low  dose BB added due to ischemic NSVT.  3. VT: shocked 3x in the lab yesterday. Placed on amiodarone and lidocaine drip. Ectopy has improved.   Will reduce lido drip/amio drip this morning. Plan to stop lido this afternoon.  -- check mag, K+ stable  BB as above  4. HL: placed on statin, but will hold at this time with elevated LFTs   5. IDDM: followed by endocrinology as outpatient.  --SSI   6. Elevated LFTs: suspect shock 2/2 to MI. Follow trend.      Signed, Reino Bellis, NP  04/08/2018, 8:23 AM     ATTENDING ATTESTATION  I have seen, examined and evaluated the patient this AM along with Reino Bellis, NP-C.  After reviewing all the available data and chart, we discussed the patients laboratory, study & physical findings as well as symptoms in detail. I agree with her findings, examination as well as impression recommendations as per our discussion.    Attending adjustments noted in italics.   Seems to stabilized out overnight from a rhythmic standpoint.  No further chest pain. We will plan to DC TPM today, and pull sheath once lidocaine drip is stopped. Wean down to low-dose amiodarone and run tonight.  Convert to p.o. tomorrow. Add p.o. beta-blocker. Holding statin until LFTs stabilize.  Continue DAPT  Monitor today in ICU.  If stable no further arrhythmia overnight, can transfer to telemetry/stepdown tomorrow. Based on his presenting symptoms and arrhythmias, would not be considered for fast track discharge.   Glenetta Hew, M.D., M.S. Interventional Cardiologist   Pager # [phone] Phone # [phone] [address] [place], [place] [address]

## 2018-04-08 NOTE — Progress Notes (Addendum)
2030: Dr. Ellyn Hack at bedside to assess hematoma. Per MD, decrease femostop pressure by 10 q47mins until pressure at 30. Verify that hematoma not increasing. If oozing, leave femstop on at 30 until hemostasis achieved. Femostop pressure currently at 75. Hematoma marked.    Addendum:   0230: Hemostasis achieved and femostop removed. Hematoma continues to be soft and no increase in size observed. Vital signs have remained stable throughout the night. Will continue to monitor.

## 2018-04-08 NOTE — Care Management (Signed)
Brilinta benefits check sent and pending.  Rehana Uncapher RN, BSN, NCM-BC, ACM-RN [phone] 

## 2018-04-08 NOTE — Progress Notes (Signed)
Arterial and venous sheaths removed. Level 1 with minor bruising and hematoma, pressure reapplied. Upon next assessment, recurrent hematoma. Pressure reapplied. VSS. Cath lab and MD made aware. Sharolyn Douglas, MD ordered to place Femostop. Femostop placed with Duane Lope, RN.

## 2018-04-08 NOTE — Care Management (Signed)
#  1. S/W      QUARIN    @  [facility] # [phone] OPT- MEMBER  TICAGRELOR  : NON-FORMULARY  BRILINTA  90 MG BID COVER- YES CO-PAY-  $ 100.00 TIER- 4 DRUG PRIOR APPROVAL- NO  NO DEDUCTIBLE OUT-OF-POCKET : NOT MET  PREFERRED PHARMACY : YES [facility] M/O 90 DAY SUPPLY FOR M/O $ 300.00

## 2018-04-08 NOTE — Progress Notes (Signed)
  Echocardiogram 2D Echocardiogram has been performed.  Xavier Caldwell 04/08/2018, 11:19 AM

## 2018-04-09 ENCOUNTER — Other Ambulatory Visit: Payer: Self-pay

## 2018-04-09 ENCOUNTER — Encounter (HOSPITAL_COMMUNITY): Payer: Self-pay | Admitting: *Deleted

## 2018-04-09 ENCOUNTER — Encounter (HOSPITAL_COMMUNITY): Payer: Self-pay | Admitting: General Practice

## 2018-04-09 DIAGNOSIS — S301XXA Contusion of abdominal wall, initial encounter: Secondary | ICD-10-CM | POA: Diagnosis not present

## 2018-04-09 LAB — CBC
HCT: 33.6 % — ABNORMAL LOW (ref 39.0–52.0)
Hemoglobin: 11.3 g/dL — ABNORMAL LOW (ref 13.0–17.0)
MCH: 30.1 pg (ref 26.0–34.0)
MCHC: 33.6 g/dL (ref 30.0–36.0)
MCV: 89.6 fL (ref 80.0–100.0)
Platelets: 159 10*3/uL (ref 150–400)
RBC: 3.75 MIL/uL — ABNORMAL LOW (ref 4.22–5.81)
RDW: 13.3 % (ref 11.5–15.5)
WBC: 14.6 10*3/uL — ABNORMAL HIGH (ref 4.0–10.5)
nRBC: 0 % (ref 0.0–0.2)

## 2018-04-09 LAB — COMPREHENSIVE METABOLIC PANEL
ALT: 567 U/L — ABNORMAL HIGH (ref 0–44)
AST: 367 U/L — ABNORMAL HIGH (ref 15–41)
Albumin: 3 g/dL — ABNORMAL LOW (ref 3.5–5.0)
Alkaline Phosphatase: 61 U/L (ref 38–126)
Anion gap: 7 (ref 5–15)
BUN: 27 mg/dL — ABNORMAL HIGH (ref 8–23)
CHLORIDE: 102 mmol/L (ref 98–111)
CO2: 21 mmol/L — ABNORMAL LOW (ref 22–32)
Calcium: 8 mg/dL — ABNORMAL LOW (ref 8.9–10.3)
Creatinine, Ser: 1.18 mg/dL (ref 0.61–1.24)
GFR calc Af Amer: >60 mL/min (ref >60)
GFR calc non Af Amer: >60 mL/min (ref >60)
Glucose, Bld: 137 mg/dL — ABNORMAL HIGH (ref 70–99)
POTASSIUM: 4.1 mmol/L (ref 3.5–5.1)
Sodium: 130 mmol/L — ABNORMAL LOW (ref 135–145)
Total Bilirubin: 0.4 mg/dL (ref 0.3–1.2)
Total Protein: 5.2 g/dL — ABNORMAL LOW (ref 6.5–8.1)

## 2018-04-09 LAB — GLUCOSE, CAPILLARY
Glucose-Capillary: 110 mg/dL — ABNORMAL HIGH (ref 70–99)
Glucose-Capillary: 142 mg/dL — ABNORMAL HIGH (ref 70–99)
Glucose-Capillary: 179 mg/dL — ABNORMAL HIGH (ref 70–99)
Glucose-Capillary: 232 mg/dL — ABNORMAL HIGH (ref 70–99)

## 2018-04-09 LAB — MRSA PCR SCREENING: MRSA BY PCR: POSITIVE — AB

## 2018-04-09 LAB — MAGNESIUM: Magnesium: 2.1 mg/dL (ref 1.7–2.4)

## 2018-04-09 LAB — CALCIUM, IONIZED: Calcium, Ionized, Serum: 4.6 mg/dL (ref 4.5–5.6)

## 2018-04-09 MED ORDER — CARVEDILOL 3.125 MG PO TABS
3.1250 mg | ORAL_TABLET | Freq: Two times a day (BID) | ORAL | Status: DC
  Administered 2018-04-09 – 2018-04-12 (×8): 3.125 mg via ORAL
  Filled 2018-04-09 (×8): qty 1

## 2018-04-09 MED ORDER — AMIODARONE HCL 200 MG PO TABS
400.0000 mg | ORAL_TABLET | Freq: Every day | ORAL | Status: DC
  Administered 2018-04-09: 400 mg via ORAL
  Filled 2018-04-09 (×2): qty 2

## 2018-04-09 MED ORDER — FUROSEMIDE 10 MG/ML IJ SOLN
20.0000 mg | Freq: Once | INTRAMUSCULAR | Status: AC
  Administered 2018-04-09: 20 mg via INTRAVENOUS
  Filled 2018-04-09: qty 2

## 2018-04-09 MED ORDER — LISINOPRIL 20 MG PO TABS
20.0000 mg | ORAL_TABLET | Freq: Every day | ORAL | Status: DC
  Administered 2018-04-09 – 2018-04-14 (×6): 20 mg via ORAL
  Filled 2018-04-09 (×6): qty 1

## 2018-04-09 NOTE — Progress Notes (Signed)
Progress Note  Patient Name: Xavier Caldwell Date of Encounter: 04/09/2018  Primary Cardiologist: No primary care provider on file.   Subjective   Somewhat complicated evening last night with arterial and venous sheath removal.  Hematoma noted with track issues.  FemoStop placed.  Per nursing note, successfully weaned overnight.  This morning I saw him as he was completing a walk around the unit with a walker.  Little slow, shuffling gait, but seems to be feeling much better. Examination was while he was in the recliner.  Inpatient Medications    Scheduled Meds: . aspirin EC  81 mg Oral Daily  . Chlorhexidine Gluconate Cloth  6 each Topical Daily  . Chlorhexidine Gluconate Cloth  6 each Topical Daily  . DULoxetine  60 mg Oral QPM  . insulin aspart  0-15 Units Subcutaneous TID WC  . insulin aspart  0-5 Units Subcutaneous QHS  . insulin glargine  35 Units Subcutaneous QHS  . metoprolol tartrate  12.5 mg Oral BID  . pregabalin  150 mg Oral BID  . rOPINIRole  1 mg Oral TID  . sodium chloride flush  10-40 mL Intracatheter Q12H  . sodium chloride flush  3 mL Intravenous Q12H  . ticagrelor  90 mg Oral BID  . timolol  1 drop Left Eye Daily   Continuous Infusions: . sodium chloride Stopped (04/08/18 1351)  . amiodarone 30 mg/hr (04/09/18 0700)   PRN Meds: sodium chloride, acetaminophen, morphine injection, ondansetron (ZOFRAN) IV, oxyCODONE, sodium chloride flush, sodium chloride flush   Vital Signs    Vitals:   04/09/18 0600 04/09/18 0605 04/09/18 0645 04/09/18 0700  BP: (!) 179/86 (!) 152/79    Pulse: 62   65  Resp: 20   19  Temp:   (!) 97.5 F (36.4 C)   TempSrc:   Oral   SpO2: 97%   96%  Weight:      Height:        Intake/Output Summary (Last 24 hours) at 04/09/2018 0718 Last data filed at 04/09/2018 0700 Gross per 24 hour  Intake 921.76 ml  Output 1425 ml  Net -503.24 ml   Last 3 Weights 04/08/2018 04/07/2018 12/23/2017  Weight (lbs) 230 lb 6.1 oz 219 lb 9.3 oz 219  lb  Weight (kg) 104.5 kg 99.6 kg 99.338 kg      Telemetry    Mostly sinus rhythm with frequent PVCs, but no further NSVT since yesterday at noon personally Reviewed  ECG    No repeat EKG- Personally Reviewed  Physical Exam   GEN: No acute distress.  Appears comfortable in the recliner. Neck:  No JVD or carotid bruit Cardiac:  RRR with normal S1-S2.  Relatively frequent ectopy.  No rubs or gallops. Respiratory:  CTA B, nonlabored, good air movement GI:  Soft/NT/ND/NABS MS:  No clubbing or cyanosis or edema.  Right groin site has relatively significant ecchymosis but soft and no active hematoma.  Distal pulses intact Neuro:   Nonfocal, slow gait Psych: Normal mood and affect.  Labs    Chemistry Recent Labs  Lab 04/07/18 1349  04/07/18 2050 04/08/18 0027 04/08/18 0405 04/09/18 0403  NA 139   < > 131* 135 135 130*  K 4.5   < > 4.3 4.2 4.2 4.1  CL 110  --  105  --  105 102  CO2 21*  --  18*  --  21* 21*  GLUCOSE 173*  --  170*  --  183* 137*  BUN 12  --  21  --  29* 27*  CREATININE 1.25*   < > 1.61*  --  1.86* 1.18  CALCIUM 6.9*  --  7.9*  --  8.3* 8.0*  PROT 4.5*  --   --   --  5.2* 5.2*  ALBUMIN 2.5*  --   --   --  3.0* 3.0*  AST 224*  --   --   --  701* 367*  ALT 184*  --   --   --  554* 567*  ALKPHOS 57  --   --   --  17* 61  BILITOT 1.1  --   --   --  0.2* 0.4  GFRNONAA >60  --  44*  --  37* >60  GFRAA >60  --  51*  --  43* >60  ANIONGAP 8  --  8  --  9 7   < > = values in this interval not displayed.     Hematology Recent Labs  Lab 04/07/18 1349  04/08/18 0027 04/08/18 0405 04/09/18 0403  WBC 14.5*  --   --  13.5* 14.6*  RBC 4.16*  --   --  4.37 3.75*  HGB 12.4*   < > 12.9* 12.8* 11.3*  HCT 38.0*   < > 38.0* 39.1 33.6*  MCV 91.3  --   --  89.5 89.6  MCH 29.8  --   --  29.3 30.1  MCHC 32.6  --   --  32.7 33.6  RDW 13.2  --   --  13.2 13.3  PLT 172  --   --  175 159   < > = values in this interval not displayed.    Cardiac Enzymes Recent Labs    Lab 04/07/18 1349 04/07/18 2050 04/08/18 0405 04/08/18 0816  TROPONINI 0.30* >65.00* >65.00* >65.00*   No results for input(s): TROPIPOC in the last 168 hours.   BNPNo results for input(s): BNP, PROBNP in the last 168 hours.   DDimer No results for input(s): DDIMER in the last 168 hours.   Radiology    Dg Chest 1 View  Result Date: 04/07/2018 CLINICAL DATA:  Transvenous pacer placement EXAM: CHEST  1 VIEW COMPARISON:  11/18/2016 FINDINGS: Mild cardiomegaly. Ascending pacer lead with tip projecting over the right ventricle. Lung fields are clear. No pneumothorax. IMPRESSION: Ascending pacer lead with tip projecting over the right ventricle. Mild cardiomegaly. Electronically Signed   By: Donavan Foil M.D.   On: 04/07/2018 22:09    Cardiac Studies    Cath: 04/07/2018   Prox RCA lesion is 100% stenosed.  A drug-eluting stent was successfully placed using a STENT RESOLUTE ONYX 3.5X30.  Post intervention, there is a 0% residual stenosis.  Ost LAD to Prox LAD lesion is 30% stenosed.Mid LAD lesion is 40% stenosed.  Ost 2nd Mrg to 2nd Mrg lesion is 40% stenosed.    1. 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 Ventricular fibrillation/cardiac arrest with successful resuscitation 3. Successful PTCA/DES x 1 proximal RCA.  4. Successful aspiration thrombectomy distal RCA 5. Mild to moderate non-obstructive disease in the Circumflex and LAD  Recommendations: Will admit to ICU. Echo in the am. Will continue Aggrastat for 18 hours. Will continue ASA/Brilinta and statin. Will not start a beta blocker today given hypotension and bradycardia. Ace inh on hold with hypotension.    Echo 04/09/2018: LVEF 45-50%, global hypokinesis with more severe basal to mid inferior and inferoseptal hypokinesis and hyperdynamic apical function, incoordinate  septal motion, temporary transvenous pacer noted in the IVC, mild RV systolic dysfunction, moderate LVH, MAC with  trivial MR, dilated IVC   Patient Profile     68 y.o. male PMH of HL, HTN, IDDM, and OSA who presented with chest pain and CODE STEMI called in the field.   Was also noted to be in symptomatic bradycardia requiring temporary pacemaker (TPM).  Emergent cardiac cath revealed occluded RCA.  PCI of the RCA complicated by ventricular tachycardia requiring defibrillation x3.  DES PCI to RCA.  Assessment & Plan    Principal Problem:   Acute ST elevation myocardial infarction (STEMI) of inferior wall (HCC) Active Problems:   Symptomatic bradycardia - - ischemic, s/p TPM   Coronary artery disease involving native coronary artery of native heart with unstable angina pectoris (HCC)   Presence of drug coated stent in right coronary artery   Ventricular tachycardia (HCC)   Hematoma of groin   Hyperlipidemia associated with type 2 diabetes mellitus ([place])   Uncontrolled type 2 diabetes mellitus with complication, with long-term current use of insulin (HCC)   Essential hypertension, benign   1. Inferior STEMI: s/p PCI/DES x1 to the pRCA 04/08/2018-  Completed 18 hours of Aggrastat due to distal embolization;   significant LFT elevation in the setting of likely possible RV infarct and VT.  On DAPT - ASA/Brilinta.   Tolerating low-dose beta-blocker, will convert to carvedilol today and given reduced EF on echo will add losartan  hold statin in the setting of elevated LFTs  2. Bradycardia: Ischemic related.  Temp wire in place, but no paced beats on telemetry. Rates mostly in the 67s.   Tmp wire removed yesterday morning  Low dose BB added due to ischemic NSVT.  3. VT: shocked 3x in the Cath Lab March 3 during cath/PCI --> placed on amiodarone and lidocaine drip added overnight for continued ectopy. Ectopy has improved.   Plan to convert from amiodarone drip to oral amiodarone today. --Magnesium and potassium stable  Convert from low-dose metoprolol to carvedilol.  4. HL: Initially placed on  statin, but will hold at this time with elevated LFTs, I anticipate starting in 1 to 2 days.  5. IDDM: followed by endocrinology as outpatient.  Hemoglobin A1c 6.7 in November --SSI, CBGs stable.  6. Elevated LFTs: suspect shock liver 2/2 to MI with RV infarct and VT.  Slowly trending down.  7.  Right groin hematoma after sheath removal last night.  Had significant oozing requiring FemoStop placement.  Hematoma seems soft now.  Not unexpectedly roughly 1 unit hemoglobin dropped related to hematoma and cardiac cath.  Follow hemoglobin levels.    Signed, Glenetta Hew, MD  04/09/2018, 7:18 AM     Glenetta Hew, M.D., M.S. Interventional Cardiologist   Pager # [phone] Phone # [phone] [address] [place], [place] [address]

## 2018-04-09 NOTE — Progress Notes (Signed)
Pt is in Afib with a rate between 40's to 50's, notified Cards Fellow Dr. Aundra Dubin and he stated to continue to monitor through out the night.

## 2018-04-09 NOTE — Progress Notes (Signed)
Upon reassessment of right groin, bruising has spread across scrotum. Bruising is soft throughout site.  Site is painful for patient especially with ambulation. There is nobruising on back at this time. VSS. RN will continue to monitor patient.

## 2018-04-09 NOTE — Progress Notes (Signed)
CARDIAC REHAB PHASE I   PRE:  Rate/Rhythm: 58 SB  BP:  Supine: 154/71  Sitting:   Standing:    SaO2: 95%RA  MODE:  Ambulation: door ft   POST:  Rate/Rhythm: 70 SR  BP:  Supine:   Sitting: 128/59,  115/59  Standing:    SaO2: 94%RA [id number] RN stated she had walked with pt earlier and he did ok.  Pt's family brought his rollator from home. Attempted to walk with pt with rollator but he stated he just got some medication and he was too lightheaded. We walked from door to recliner. Pt's BP drop to 129/59 and then after sitting a few minutes it was lower at 115/59. Discussed with pt to not get up by himself. Family in room. Began MI education. Reviewed NTG use, MI restrictions, importance of brilinta with stent, and CRP 2. Referred to [facility] program. Will see tomorrow and reattempt amb and continue ed.    Graylon Good, RN BSN  04/09/2018 11:13 AM

## 2018-04-10 DIAGNOSIS — I4891 Unspecified atrial fibrillation: Secondary | ICD-10-CM

## 2018-04-10 LAB — COMPREHENSIVE METABOLIC PANEL
ALT: 592 U/L — ABNORMAL HIGH (ref 0–44)
AST: 278 U/L — AB (ref 15–41)
Albumin: 2.9 g/dL — ABNORMAL LOW (ref 3.5–5.0)
Alkaline Phosphatase: 58 U/L (ref 38–126)
Anion gap: 5 (ref 5–15)
BUN: 21 mg/dL (ref 8–23)
CO2: 23 mmol/L (ref 22–32)
Calcium: 8.2 mg/dL — ABNORMAL LOW (ref 8.9–10.3)
Chloride: 106 mmol/L (ref 98–111)
Creatinine, Ser: 1.05 mg/dL (ref 0.61–1.24)
GFR calc Af Amer: >60 mL/min (ref >60)
GFR calc non Af Amer: >60 mL/min (ref >60)
Glucose, Bld: 127 mg/dL — ABNORMAL HIGH (ref 70–99)
POTASSIUM: 4 mmol/L (ref 3.5–5.1)
Sodium: 134 mmol/L — ABNORMAL LOW (ref 135–145)
Total Bilirubin: 0.6 mg/dL (ref 0.3–1.2)
Total Protein: 5.3 g/dL — ABNORMAL LOW (ref 6.5–8.1)

## 2018-04-10 LAB — GLUCOSE, CAPILLARY
Glucose-Capillary: 81 mg/dL (ref 70–99)
Glucose-Capillary: 147 mg/dL — ABNORMAL HIGH (ref 70–99)
Glucose-Capillary: 149 mg/dL — ABNORMAL HIGH (ref 70–99)
Glucose-Capillary: 184 mg/dL — ABNORMAL HIGH (ref 70–99)

## 2018-04-10 LAB — HEPARIN LEVEL (UNFRACTIONATED): Heparin Unfractionated: 0.22 IU/mL — ABNORMAL LOW (ref 0.30–0.70)

## 2018-04-10 MED ORDER — HEPARIN BOLUS VIA INFUSION
2500.0000 [IU] | Freq: Once | INTRAVENOUS | Status: AC
  Administered 2018-04-10: 2500 [IU] via INTRAVENOUS
  Filled 2018-04-10: qty 2500

## 2018-04-10 MED ORDER — CHLORHEXIDINE GLUCONATE CLOTH 2 % EX PADS
6.0000 | MEDICATED_PAD | Freq: Every day | CUTANEOUS | Status: DC
  Administered 2018-04-11 – 2018-04-13 (×3): 6 via TOPICAL

## 2018-04-10 MED ORDER — MUPIROCIN 2 % EX OINT
1.0000 "application " | TOPICAL_OINTMENT | Freq: Two times a day (BID) | CUTANEOUS | Status: DC
  Administered 2018-04-10 – 2018-04-14 (×9): 1 via NASAL
  Filled 2018-04-10 (×2): qty 22

## 2018-04-10 MED ORDER — TIMOLOL MALEATE 0.5 % OP SOLN
1.0000 [drp] | Freq: Two times a day (BID) | OPHTHALMIC | Status: DC
  Administered 2018-04-10 – 2018-04-14 (×8): 1 [drp] via OPHTHALMIC
  Filled 2018-04-10: qty 5

## 2018-04-10 MED ORDER — CLOPIDOGREL BISULFATE 75 MG PO TABS
75.0000 mg | ORAL_TABLET | Freq: Every day | ORAL | Status: DC
  Administered 2018-04-11 – 2018-04-14 (×4): 75 mg via ORAL
  Filled 2018-04-10 (×4): qty 1

## 2018-04-10 MED ORDER — HEPARIN (PORCINE) 25000 UT/250ML-% IV SOLN
1800.0000 [IU]/h | INTRAVENOUS | Status: DC
  Administered 2018-04-10: 1300 [IU]/h via INTRAVENOUS
  Administered 2018-04-11: 1500 [IU]/h via INTRAVENOUS
  Administered 2018-04-11: 1700 [IU]/h via INTRAVENOUS
  Filled 2018-04-10 (×5): qty 250

## 2018-04-10 MED ORDER — ATORVASTATIN CALCIUM 40 MG PO TABS
40.0000 mg | ORAL_TABLET | Freq: Every day | ORAL | Status: DC
  Administered 2018-04-10 – 2018-04-13 (×4): 40 mg via ORAL
  Filled 2018-04-10 (×4): qty 1

## 2018-04-10 MED ORDER — CLOPIDOGREL BISULFATE 300 MG PO TABS
600.0000 mg | ORAL_TABLET | Freq: Once | ORAL | Status: AC
  Administered 2018-04-10: 600 mg via ORAL
  Filled 2018-04-10: qty 2

## 2018-04-10 MED ORDER — AMIODARONE HCL 200 MG PO TABS
400.0000 mg | ORAL_TABLET | Freq: Two times a day (BID) | ORAL | Status: DC
  Administered 2018-04-10 – 2018-04-14 (×9): 400 mg via ORAL
  Filled 2018-04-10 (×9): qty 2

## 2018-04-10 NOTE — Care Management Note (Signed)
Case Management Note  Patient Details  Name: Xavier Caldwell MRN: [id number] Date of Birth: 10/13/1950  Subjective/Objective:  67 yo male presented with weakness. PMH: HL, HTN, IDDM, and OSA who presented with chest pain and CODE STEMI called in the field; s/p cath with stent placement.             Action/Plan: CM met with patient/spouse to discuss transitional needs. Patient states living at home with his spouse and being independent with his ADLs, utilizing a rollator to assist during ambulation. PCP verified as: Dr. Lawrence Fusco; Pharmacy: [facility]. Eliquis benefits check sent and completed with est monthly copay cost $94; CM discussed copay with patient/spouse with both verbalizing understanding and provided patient with an Eliquis 30-day free card for use at his pharmacy. No further needs from CM.   Expected Discharge Date:                  Expected Discharge Plan:  Home/Self Care  In-House Referral:  NA  Discharge planning Services  Medication Assistance, CM Consult(Eliquis benefits check and 30-day free card)  Post Acute Care Choice:  NA Choice offered to:  NA  DME Arranged:  N/A DME Agency:  NA  HH Arranged:  NA HH Agency:  NA  Status of Service:  In process, will continue to follow  If discussed at Long Length of Stay Meetings, dates discussed:    Additional Comments:    RN, BSN, NCM-BC, ACM-RN [phone] 04/10/2018, 4:11 PM  

## 2018-04-10 NOTE — Progress Notes (Signed)
CARDIAC REHAB PHASE I   PRE:  Rate/Rhythm: 77 afib  BP:  Supine:   Sitting: 110/60  Standing:    SaO2: 99%RA  MODE:  Ambulation: 740 ft   POST:  Rate/Rhythm: 94 afib  BP:  Supine:   Sitting: 130/71  Standing:    SaO2: 99%RA 0905-1005 Pt walked 740 ft on RA with his rollator independently. I managed IV pole and monitor. Remained in atrial fib. Completed ed. Reviewed ex ed, plavix(since changed from [place]), carb counting and heart healthy food choices. Also gave pt OFF the BEAT booklet and discussed atrial fib and reasoning for anticoagulation. Pt and wife voiced understanding.  To recliner after walk.   Graylon Good, RN BSN  04/10/2018 9:59 AM

## 2018-04-10 NOTE — Progress Notes (Signed)
ANTICOAGULATION CONSULT NOTE - Initial Consult  Pharmacy Consult for heparin Indication: atrial fibrillation  No Known Allergies  Patient Measurements: Height: 5\' 11"  (180.3 cm) Weight: 230 lb 6.1 oz (104.5 kg) IBW/kg (Calculated) : 75.3 Heparin Dosing Weight: 95kg  Vital Signs: Temp: 98.5 F (36.9 C) (03/06 0700) Temp Source: Oral (03/06 0700) BP: 119/70 (03/06 0730) Pulse Rate: 75 (03/06 0730)  Labs: Recent Labs    04/07/18 1349  04/07/18 2050 04/08/18 0027 04/08/18 0405 04/08/18 0816 04/09/18 0403 04/10/18 0241  HGB 12.4*   < >  --  12.9* 12.8*  --  11.3*  --   HCT 38.0*   < >  --  38.0* 39.1  --  33.6*  --   PLT 172  --   --   --  175  --  159  --   APTT 38*  --   --   --   --   --   --   --   LABPROT 16.0*  --   --   --   --   --   --   --   INR 1.3*  --   --   --   --   --   --   --   CREATININE 1.25*   < > 1.61*  --  1.86*  --  1.18 1.05  TROPONINI 0.30*  --  >65.00*  --  >65.00* >65.00*  --   --    < > = values in this interval not displayed.    Estimated Creatinine Clearance: 86.3 mL/min (by C-G formula based on SCr of 1.05 mg/dL).   Medical History: Past Medical History:  Diagnosis Date  . Anxiety   . Arthritis   . Back pain   . Coronary artery disease   . Diabetes mellitus without complication ([place])   . High cholesterol   . History of gout   . Hypertension   . Myocardial infarction ([place]) 04/2018  . Neuropathy   . 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 Sleep apnea    Stop Bang score of 4     Assessment: 42 yoM s/p STEMI now in new AFib. Pt noted to have hematoma post/cath which is now resolving and soft per RN, CBC is stable. Pt on no OAC PTA and has not received DVT prophylaxis post/cath. Will give smaller bolus in setting of hematoma. Pt on DAPT s/p stent, will likely need to transition to Plavix + DOAC closer to discharge.  Goal of Therapy:  Heparin level 0.3-0.7 units/ml Monitor platelets by anticoagulation  protocol: Yes   Plan:  -Heparin 2500 units x1 -Heparin 1300 units/h -Check heparin level this afternoon -Daily heparin level and CBC  Arrie Senate, PharmD, BCPS Clinical Pharmacist [phone] Please check AMION for all [place] numbers 04/10/2018

## 2018-04-10 NOTE — Progress Notes (Signed)
Pt transferred to [facility] [place] via wheelchair.  VSS Asymptomatic with ambulating in room.  Mild SOB with 2 laps around unit earlier.  Wife at bedside.  No increase in hematoma rt groin from 12 pm assessment.

## 2018-04-10 NOTE — Care Management (Signed)
#    5.  S/W EBONY  @ [employer] # [phone]    APIXABAN : NON-FORMULARY   1. ELIQUIS   5 MG BID COVER- YES CO-PAY- $ 94.00 TIER- 3 DRUG PRIOR APPROVAL- NO  2. ELIQUIS   2.5 MG BID COVER- YES CO-PAY- $ 94.00 TIER- 3 DRUG PRIOR APPROVAL- NO  NO DEDUCTIBLE  PREFERRED PHARMACY : YES [facility]

## 2018-04-10 NOTE — Progress Notes (Addendum)
Progress Note  Patient Name: Xavier Caldwell Date of Encounter: 04/10/2018  Primary Cardiologist: No primary care provider on file.   Subjective   No further issues overnight with exception of going into rate controlled A. fib.  Totally asymptomatic.  Today's not having any issues.  No chest pain pressure or dyspnea.  No sensation of irregular heartbeats or palpitations.  Walking with cardiac rehab well.   Inpatient Medications    Scheduled Meds: . amiodarone  400 mg Oral BID  . aspirin EC  81 mg Oral Daily  . atorvastatin  40 mg Oral q1800  . carvedilol  3.125 mg Oral BID WC  . Chlorhexidine Gluconate Cloth  6 each Topical Daily  . Chlorhexidine Gluconate Cloth  6 each Topical Daily  . [START ON 04/11/2018] clopidogrel  75 mg Oral Daily  . DULoxetine  60 mg Oral QPM  . insulin aspart  0-15 Units Subcutaneous TID WC  . insulin aspart  0-5 Units Subcutaneous QHS  . insulin glargine  35 Units Subcutaneous QHS  . lisinopril  20 mg Oral Daily  . pregabalin  150 mg Oral BID  . rOPINIRole  1 mg Oral TID  . sodium chloride flush  10-40 mL Intracatheter Q12H  . sodium chloride flush  3 mL Intravenous Q12H  . timolol  1 drop Left Eye Daily   Continuous Infusions: . sodium chloride Stopped (04/08/18 1351)  . heparin 1,300 Units/hr (04/10/18 0857)   PRN Meds: sodium chloride, acetaminophen, morphine injection, ondansetron (ZOFRAN) IV, oxyCODONE, sodium chloride flush, sodium chloride flush   Vital Signs    Vitals:   04/10/18 0700 04/10/18 0730 04/10/18 0800 04/10/18 0900  BP:  119/70 (!) 124/59 110/63  Pulse: 63 75 72 70  Resp: _0 Temp: 98.5 F (36.9 C)     TempSrc: Oral     SpO2: 99% 99% 96% 100%  Weight:      Height:        Intake/Output Summary (Last 24 hours) at 04/10/2018 0929 Last data filed at 04/10/2018 0400 Gross per 24 hour  Intake 1456.67 ml  Output 2400 ml  Net -943.33 ml   Last 3 Weights 04/08/2018 04/07/2018 12/23/2017  Weight (lbs) 230 lb 6.1 oz  219 lb 9.3 oz 219 lb  Weight (kg) 104.5 kg 99.6 kg 99.338 kg      Telemetry    Mostly sinus rhythm with frequent PVCs, but no further NSVT since yesterday at noon personally Reviewed  ECG    No repeat EKG- Personally Reviewed  Physical Exam   GEN: No acute distress.  Resting comfortably in the recliner.  Observed walking 2 laps with cardiac rehab, using his rolling walker.. Neck:  No JVD or carotid bruit Cardiac:  Rate controlled irregularly regular rhythm.  No M/R/G. Respiratory:  CTA B, nonlabored, good air movement GI:  Soft/NT/ND says any BS.  No extent. MS:  No clubbing cyanosis edema.  Right groin still has bruising and ecchymosis but no true firm hematoma.  Totally soft. Neuro:   Nonfocal, slow unsteady gait using walker. Psych: Normal mood and affect.  Labs    Chemistry Recent Labs  Lab 04/08/18 0405 04/09/18 0403 04/10/18 0241  NA 135 130* 134*  K 4.2 4.1 4.0  CL 105 102 106  CO2 21* 21* 23  GLUCOSE 183* 137* 127*  BUN 29* 27* 21  CREATININE 1.86* 1.18 1.05  CALCIUM 8.3* 8.0* 8.2*  PROT 5.2* 5.2* 5.3*  ALBUMIN 3.0* 3.0* 2.9*  AST 701* 367* 278*  ALT 554* 567* 592*  ALKPHOS 17* 61 58  BILITOT 0.2* 0.4 0.6  GFRNONAA 37* >60 >60  GFRAA 43* >60 >60  ANIONGAP _0 Hematology Recent Labs  Lab 04/07/18 1349  04/08/18 0027 04/08/18 0405 04/09/18 0403  WBC 14.5*  --   --  13.5* 14.6*  RBC 4.16*  --   --  4.37 3.75*  HGB 12.4*   < > 12.9* 12.8* 11.3*  HCT 38.0*   < > 38.0* 39.1 33.6*  MCV 91.3  --   --  89.5 89.6  MCH 29.8  --   --  29.3 30.1  MCHC 32.6  --   --  32.7 33.6  RDW 13.2  --   --  13.2 13.3  PLT 172  --   --  175 159   < > = values in this interval not displayed.    Cardiac Enzymes Recent Labs  Lab 04/07/18 1349 04/07/18 2050 04/08/18 0405 04/08/18 0816  TROPONINI 0.30* >65.00* >65.00* >65.00*   No results for input(s): TROPIPOC in the last 168 hours.   BNPNo results for input(s): BNP, PROBNP in the last 168 hours.    DDimer No results for input(s): DDIMER in the last 168 hours.   Radiology    No results found.  Cardiac Studies    Cath: 04/07/2018   Prox RCA lesion is 100% stenosed.  A drug-eluting stent was successfully placed using a STENT RESOLUTE ONYX 3.5X30.  Post intervention, there is a 0% residual stenosis.  Ost LAD to Prox LAD lesion is 30% stenosed.Mid LAD lesion is 40% stenosed.  Ost 2nd Mrg to 2nd Mrg lesion is 40% stenosed.    1. 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 Ventricular fibrillation/cardiac arrest with successful resuscitation 3. Successful PTCA/DES x 1 proximal RCA.  4. Successful aspiration thrombectomy distal RCA 5. Mild to moderate non-obstructive disease in the Circumflex and LAD  Recommendations: Will admit to ICU. Echo in the am. Will continue Aggrastat for 18 hours. Will continue ASA/Brilinta and statin. Will not start a beta blocker today given hypotension and bradycardia. Ace inh on hold with hypotension.    Echo 04/09/2018: LVEF 45-50%, global hypokinesis with more severe basal to mid inferior and inferoseptal hypokinesis and hyperdynamic apical function, incoordinate septal motion, temporary transvenous pacer noted in the IVC, mild RV systolic dysfunction, moderate LVH, MAC with trivial MR, dilated IVC   Patient Profile     68 y.o. male PMH of HL, HTN, IDDM, and OSA who presented with chest pain and CODE STEMI called in the field.   Was also noted to be in symptomatic bradycardia requiring temporary pacemaker (TPM).  Emergent cardiac cath revealed occluded RCA.  PCI of the RCA complicated by ventricular tachycardia requiring defibrillation x3.  DES PCI to RCA.  Assessment & Plan    Principal Problem:   Acute ST elevation myocardial infarction (STEMI) of inferior wall (HCC) Active Problems:   Symptomatic bradycardia - - ischemic, s/p TPM   Coronary artery disease involving native coronary artery of native heart  with unstable angina pectoris (HCC)   Presence of drug coated stent in right coronary artery   Ventricular tachycardia (HCC)   Hematoma of groin   Hyperlipidemia associated with type 2 diabetes mellitus ([place])   Uncontrolled type 2 diabetes mellitus with complication, with long-term current use of insulin (HCC)   Essential hypertension, benign   1. Inferior STEMI:  s/p PCI/DES x1 to the pRCA 04/08/2018- (mild Ischemic Cardiomyopathy)  Completed 18 hours of Aggrastat due to distal embolization;   significant LFT elevation in the setting of likely possible RV infarct and VT.   On DAPT - ASA/Brilinta. -->  Because of A. fib requiring DOAC, will convert to Plavix (load 300 mg today).  Will stop aspirin on discharge.  Tolerating beta-blocker, with reduced EF we will add spironolactone.  Restarting statin with LFTs improving.  Relatively euvolemic with reduced EF.  Would like to put back on ARB prior to discharge, but need to monitor blood pressures closely.  2. Bradycardia -resolved: Ischemic related.  Temp wire in place, but no paced beats on telemetry. Rates mostly in the 61s. -Temp wire removed post PCI; normal bradycardia  Low dose BB added due to ischemic NSVT.  3. VT: shocked 3x in the Cath Lab March 3 during cath/PCI --> placed on amiodarone and lidocaine drip added overnight for continued ectopy. Ectopy has improved.   Minimal episodes on carvedilol and amiodarone.  Increasing amiodarone because of A. fib.   4. HL: Initially holding statin because of elevated LFTs.    Starting statin today..  5. IDDM: followed by endocrinology as outpatient.  Hemoglobin A1c 6.7 in November  Lantus plus SSI, CBGs stable.  6. Elevated LFTs: suspect shock liver 2/2 to MI with RV infarct and VT.    Continuing downward trend.  Okay to start statin  7.  Right groin hematoma after sheath removal last night.  Had significant oozing requiring FemoStop placement.  Hematoma seems soft now.  Not  unexpectedly roughly 1 unit hemoglobin dropped related to hematoma and cardiac cath.    Follow hemoglobin level in AM (since we are starting IV Heparin) . 8. New onset Afib - rate controlled  Is already on amiodarone, will increase to 40 mg twice daily  Because of hematoma, we will start IV heparin for at least 24 hours to ensure no worsening of bleeding or drop in hemoglobin.  If stable will convert to DOAC (Eliquis 5 mg twice daily.  Will convert from Brilinta to Plavix (load today) -likely stop aspirin on discharge     Okay to transfer to telemetry today.  Anticipate likely discharge tomorrow.  Plan would be to convert to go back in the morning if no bleeding on IV heparin.  Would be then on Plavix 75 mg daily plus still active.  Would discharge off of aspirin.   Signed, Glenetta Hew, MD  04/10/2018, 9:29 AM    ADDENDUM: Asked by RN to reassess groin hematoma. Ecchymoses spreading, only small pea-sized firm spot -mildly tender.   Looks stable, but will order Vasc US Duplex to be sure.   Glenetta Hew, M.D., M.S. Interventional Cardiologist   Pager # [phone] Phone # [phone] [address]. [place], [place] [address]

## 2018-04-10 NOTE — Care Management (Signed)
Eliquis benefits check sent and pending.  [place] RN, BSN, NCM-BC, ACM-RN [phone] 

## 2018-04-10 NOTE — Progress Notes (Signed)
ANTICOAGULATION CONSULT NOTE  Pharmacy Consult for heparin Indication: atrial fibrillation  No Known Allergies  Patient Measurements: Height: 5\' 11"  (180.3 cm) Weight: 230 lb 6.1 oz (104.5 kg) IBW/kg (Calculated) : 75.3 Heparin Dosing Weight: 95kg  Vital Signs: Temp: 97.8 F (36.6 C) (03/06 1547) Temp Source: Oral (03/06 1547) BP: 97/76 (03/06 1000) Pulse Rate: 59 (03/06 1000)  Labs: Recent Labs    04/07/18 2050  04/08/18 0027 04/08/18 0405 04/08/18 0816 04/09/18 0403 04/10/18 0241 04/10/18 1632  HGB  --    < > 12.9* 12.8*  --  11.3*  --   --   HCT  --   --  38.0* 39.1  --  33.6*  --   --   PLT  --   --   --  175  --  159  --   --   HEPARINUNFRC  --   --   --   --   --   --   --  0.22*  CREATININE 1.61*  --   --  1.86*  --  1.18 1.05  --   TROPONINI >65.00*  --   --  >65.00* >65.00*  --   --   --    < > = values in this interval not displayed.    Estimated Creatinine Clearance: 86.3 mL/min (by C-G formula based on SCr of 1.05 mg/dL).   Medical History: Past Medical History:  Diagnosis Date  . Anxiety   . Arthritis   . Back pain   . Coronary artery disease   . Diabetes mellitus without complication ([place])   . High cholesterol   . History of gout   . Hypertension   . Myocardial infarction ([place]) 04/2018  . Neuropathy   . 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 Sleep apnea    Stop Bang score of 4     Assessment: 46 yoM s/p STEMI now in new AFib. Pt noted to have hematoma post/cath which is now resolving and soft per RN, CBC is stable. Pt on no OAC PTA and has not received DVT prophylaxis post/cath. Will give smaller bolus in setting of hematoma. Pt on DAPT s/p stent, will likely need to transition to Plavix + DOAC closer to discharge.  Heparin level this afternoon slightly below goal.  Per RN no known issues with infusion.  No overt bleeding or complications noted.  Goal of Therapy:  Heparin level 0.3-0.7 units/ml Monitor platelets  by anticoagulation protocol: Yes   Plan:  1. Increase IV heparin to 1500 units/hr. 2. Check heparin level in 6 hrs. 3. Daily heparin level and CBC.  Marguerite Olea, [facility] Clinical Pharmacist Phone [phone]  04/10/2018 5:16 PM

## 2018-04-10 NOTE — Progress Notes (Signed)
Patient in room with spouse at bedside.

## 2018-04-11 ENCOUNTER — Inpatient Hospital Stay (HOSPITAL_COMMUNITY): Payer: Medicare HMO

## 2018-04-11 DIAGNOSIS — I480 Paroxysmal atrial fibrillation: Secondary | ICD-10-CM

## 2018-04-11 DIAGNOSIS — S301XXA Contusion of abdominal wall, initial encounter: Secondary | ICD-10-CM

## 2018-04-11 DIAGNOSIS — Z955 Presence of coronary angioplasty implant and graft: Secondary | ICD-10-CM

## 2018-04-11 LAB — COMPREHENSIVE METABOLIC PANEL
ALT: 369 U/L — AB (ref 0–44)
AST: 103 U/L — AB (ref 15–41)
Albumin: 2.7 g/dL — ABNORMAL LOW (ref 3.5–5.0)
Alkaline Phosphatase: 60 U/L (ref 38–126)
Anion gap: 9 (ref 5–15)
BUN: 15 mg/dL (ref 8–23)
CALCIUM: 8.3 mg/dL — AB (ref 8.9–10.3)
CO2: 23 mmol/L (ref 22–32)
CREATININE: 0.82 mg/dL (ref 0.61–1.24)
Chloride: 104 mmol/L (ref 98–111)
GFR calc Af Amer: >60 mL/min (ref >60)
GFR calc non Af Amer: >60 mL/min (ref >60)
Glucose, Bld: 102 mg/dL — ABNORMAL HIGH (ref 70–99)
Potassium: 3.9 mmol/L (ref 3.5–5.1)
Sodium: 136 mmol/L (ref 135–145)
Total Bilirubin: 0.8 mg/dL (ref 0.3–1.2)
Total Protein: 5.6 g/dL — ABNORMAL LOW (ref 6.5–8.1)

## 2018-04-11 LAB — GLUCOSE, CAPILLARY
Glucose-Capillary: 102 mg/dL — ABNORMAL HIGH (ref 70–99)
Glucose-Capillary: 105 mg/dL — ABNORMAL HIGH (ref 70–99)
Glucose-Capillary: 95 mg/dL (ref 70–99)
Glucose-Capillary: 191 mg/dL — ABNORMAL HIGH (ref 70–99)
Glucose-Capillary: 146 mg/dL — ABNORMAL HIGH (ref 70–99)

## 2018-04-11 LAB — HEPARIN LEVEL (UNFRACTIONATED)
HEPARIN UNFRACTIONATED: 0.3 [IU]/mL (ref 0.30–0.70)
Heparin Unfractionated: 0.18 IU/mL — ABNORMAL LOW (ref 0.30–0.70)
Heparin Unfractionated: 0.36 IU/mL (ref 0.30–0.70)

## 2018-04-11 LAB — CBC
HCT: 29.2 % — ABNORMAL LOW (ref 39.0–52.0)
HEMOGLOBIN: 9.9 g/dL — AB (ref 13.0–17.0)
MCH: 30.1 pg (ref 26.0–34.0)
MCHC: 33.9 g/dL (ref 30.0–36.0)
MCV: 88.8 fL (ref 80.0–100.0)
NRBC: 0 % (ref 0.0–0.2)
Platelets: 167 10*3/uL (ref 150–400)
RBC: 3.29 MIL/uL — AB (ref 4.22–5.81)
RDW: 13 % (ref 11.5–15.5)
WBC: 10.8 10*3/uL — ABNORMAL HIGH (ref 4.0–10.5)

## 2018-04-11 LAB — TSH: TSH: 0.547 u[IU]/mL (ref 0.350–4.500)

## 2018-04-11 LAB — MAGNESIUM: Magnesium: 1.7 mg/dL (ref 1.7–2.4)

## 2018-04-11 MED ORDER — INSULIN GLARGINE 100 UNIT/ML ~~LOC~~ SOLN
35.0000 [IU] | Freq: Every day | SUBCUTANEOUS | Status: DC
  Administered 2018-04-13: 35 [IU] via SUBCUTANEOUS
  Filled 2018-04-11 (×2): qty 0.35

## 2018-04-11 MED ORDER — INSULIN GLARGINE 100 UNIT/ML ~~LOC~~ SOLN
17.0000 [IU] | Freq: Every day | SUBCUTANEOUS | Status: AC
  Administered 2018-04-12: 17 [IU] via SUBCUTANEOUS
  Filled 2018-04-11: qty 0.17

## 2018-04-11 NOTE — Progress Notes (Signed)
Limited right lower extremity arterial duplex has been completed.  Refer to "CV Proc" under chart review to view preliminary results.  04/11/2018 11:30 AM Maudry Mayhew, MHA, RVT, RDCS, RDMS

## 2018-04-11 NOTE — Progress Notes (Addendum)
Progress Note  Patient Name: Xavier Caldwell Date of Encounter: 04/11/2018  Primary Cardiologist: Dr. Ellyn Hack  Subjective   Remains in rate controlled atrial fibrillation,    No chest pain or groin pain, started to walk.   Inpatient Medications    Scheduled Meds: . amiodarone  400 mg Oral BID  . aspirin EC  81 mg Oral Daily  . atorvastatin  40 mg Oral q1800  . carvedilol  3.125 mg Oral BID WC  . Chlorhexidine Gluconate Cloth  6 each Topical Daily  . Chlorhexidine Gluconate Cloth  6 each Topical Daily  . Chlorhexidine Gluconate Cloth  6 each Topical Q0600  . clopidogrel  75 mg Oral Daily  . DULoxetine  60 mg Oral QPM  . insulin aspart  0-15 Units Subcutaneous TID WC  . insulin aspart  0-5 Units Subcutaneous QHS  . insulin glargine  35 Units Subcutaneous QHS  . lisinopril  20 mg Oral Daily  . mupirocin ointment  1 application Nasal BID  . pregabalin  150 mg Oral BID  . rOPINIRole  1 mg Oral TID  . sodium chloride flush  10-40 mL Intracatheter Q12H  . sodium chloride flush  3 mL Intravenous Q12H  . timolol  1 drop Both Eyes BID   Continuous Infusions: . sodium chloride Stopped (04/08/18 1351)  . heparin 1,700 Units/hr (04/11/18 0620)   PRN Meds: sodium chloride, acetaminophen, morphine injection, ondansetron (ZOFRAN) IV, oxyCODONE, sodium chloride flush, sodium chloride flush   Vital Signs    Vitals:   04/10/18 1818 04/10/18 2008 04/11/18 0435 04/11/18 0436  BP:  139/84 127/70   Pulse:  63 64   Resp:  16 16   Temp:  98.7 F (37.1 C) 98.9 F (37.2 C)   TempSrc:  Oral Oral   SpO2:  98% 99%   Weight: 101.2 kg   99.8 kg  Height: _0  (1.803 m)       Intake/Output Summary (Last 24 hours) at 04/11/2018 0910 Last data filed at 04/11/2018 0819 Gross per 24 hour  Intake 888.29 ml  Output 1450 ml  Net -561.71 ml   Last 3 Weights 04/11/2018 04/10/2018 04/10/2018  Weight (lbs) 220 lb 223 lb 223 lb  Weight (kg) 99.791 kg 101.152 kg 101.152 kg      Telemetry    Mostly  sinus rhythm with frequent PVCs, but no further NSVT since yesterday at noon personally Reviewed  ECG    No repeat EKG- Personally Reviewed  Physical Exam   GEN: No acute distress.  Resting comfortably in the recliner.  Observed walking 2 laps with cardiac rehab, using his rolling walker.. Neck:  No JVD or carotid bruit Cardiac:  Rate controlled irregularly regular rhythm.  No M/R/G. Respiratory:  CTA B, nonlabored, good air movement GI:  Soft/NT/ND says any BS.  No extent. MS:  No clubbing cyanosis edema.  Right groin still has bruising and ecchymosis but no true firm hematoma.  Totally soft. Neuro:   Nonfocal, slow unsteady gait using walker. Psych: Normal mood and affect.  Labs    Chemistry Recent Labs  Lab 04/09/18 0403 04/10/18 0241 04/11/18 0424  NA 130* 134* 136  K 4.1 4.0 3.9  CL 102 106 104  CO2 21* 23 23  GLUCOSE 137* 127* 102*  BUN 27* 21 15  CREATININE 1.18 1.05 0.82  CALCIUM 8.0* 8.2* 8.3*  PROT 5.2* 5.3* 5.6*  ALBUMIN 3.0* 2.9* 2.7*  AST 367* 278* 103*  ALT 567* 592* 369*  ALKPHOS 61 58 60  BILITOT 0.4 0.6 0.8  GFRNONAA >60 >60 >60  GFRAA >60 >60 >60  ANIONGAP _0 Hematology Recent Labs  Lab 04/08/18 0405 04/09/18 0403 04/11/18 0424  WBC 13.5* 14.6* 10.8*  RBC 4.37 3.75* 3.29*  HGB 12.8* 11.3* 9.9*  HCT 39.1 33.6* 29.2*  MCV 89.5 89.6 88.8  MCH 29.3 30.1 30.1  MCHC 32.7 33.6 33.9  RDW 13.2 13.3 13.0  PLT 175 159 167    Cardiac Enzymes Recent Labs  Lab 04/07/18 1349 04/07/18 2050 04/08/18 0405 04/08/18 0816  TROPONINI 0.30* >65.00* >65.00* >65.00*   No results for input(s): TROPIPOC in the last 168 hours.   BNPNo results for input(s): BNP, PROBNP in the last 168 hours.   DDimer No results for input(s): DDIMER in the last 168 hours.   Radiology    No results found.  Cardiac Studies    Cath: 04/07/2018   Prox RCA lesion is 100% stenosed.  A drug-eluting stent was successfully placed using a STENT RESOLUTE ONYX  3.5X30.  Post intervention, there is a 0% residual stenosis.  Ost LAD to Prox LAD lesion is 30% stenosed.Mid LAD lesion is 40% stenosed.  Ost 2nd Mrg to 2nd Mrg lesion is 40% stenosed.    1. 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 Ventricular fibrillation/cardiac arrest with successful resuscitation 3. Successful PTCA/DES x 1 proximal RCA.  4. Successful aspiration thrombectomy distal RCA 5. Mild to moderate non-obstructive disease in the Circumflex and LAD  Recommendations: Will admit to ICU. Echo in the am. Will continue Aggrastat for 18 hours. Will continue ASA/Brilinta and statin. Will not start a beta blocker today given hypotension and bradycardia. Ace inh on hold with hypotension.    Echo 04/09/2018: LVEF 45-50%, global hypokinesis with more severe basal to mid inferior and inferoseptal hypokinesis and hyperdynamic apical function, incoordinate septal motion, temporary transvenous pacer noted in the IVC, mild RV systolic dysfunction, moderate LVH, MAC with trivial MR, dilated IVC    Patient Profile     68 y.o. male PMH of HL, HTN, IDDM, and OSA who presented with chest pain and CODE STEMI called in the field.   Was also noted to be in symptomatic bradycardia requiring temporary pacemaker (TPM).  Emergent cardiac cath revealed occluded RCA.  PCI of the RCA complicated by ventricular tachycardia requiring defibrillation x3.  DES PCI to RCA.  Assessment & Plan    Principal Problem:   Acute ST elevation myocardial infarction (STEMI) of inferior wall (HCC) Active Problems:   Uncontrolled type 2 diabetes mellitus with complication, with long-term current use of insulin (HCC)   Hyperlipidemia associated with type 2 diabetes mellitus (HCC)   Essential hypertension, benign   Symptomatic bradycardia - - ischemic, s/p TPM   Coronary artery disease involving native coronary artery of native heart with unstable angina pectoris (HCC)   Presence of drug  coated stent in right coronary artery   Ventricular tachycardia (HCC)   Hematoma of groin  1. Inferior STEMI: s/p PCI/DES x1 to the pRCA 04/08/2018- (mild Ischemic Cardiomyopathy) Completed 18 hours of Aggrastat due to distal embolization;  significant LFT elevation in the setting of likely possible RV infarct and VT. - On DAPT - ASA/Brilinta. -->  Because of A. fib requiring DOAC, converted to Plavix (load 300 mg today).  Will stop aspirin on discharge. - tolerating beta-blocker, with reduced EF we will add spironolactone. - Restarting statin with LFTs improving. -  euvolemic with reduced EF.  Would like to put back on ARB prior to discharge, but need to monitor blood pressures closely. - start PT, referral for an outpatient rehab  2. Bradycardia -resolved: Ischemic related.  Temp wire in place, but no paced beats on telemetry. Rates mostly in the 36s. -Temp wire removed post PCI; normal bradycardia  Low dose BB added due to ischemic NSVT.  3. VT: shocked 3x in the Cath Lab March 3 during cath/PCI --> placed on amiodarone and lidocaine drip added overnight for continued ectopy. Ectopy has improved.   Minimal episodes on carvedilol and amiodarone.  Increasing amiodarone because of A. fib.  4. HL: Initially holding statin because of elevated LFTs.    Starting statin today..  5. IDDM: followed by endocrinology as outpatient.  Hemoglobin A1c 6.7 in November  Lantus plus SSI, CBGs stable.  6. Elevated LFTs: suspect shock liver 2/2 to MI with RV infarct and VT.    Continuing downward trend.  Okay to start statin  7.  Right groin hematoma after sheath removal last night.  Had significant oozing requiring FemoStop placement.  Hematoma seems soft now.  Not unexpectedly roughly 1 unit hemoglobin dropped related to hematoma and cardiac cath.    Follow hemoglobin level in AM (since we are starting IV Heparin) . 8. New onset Afib - rate controlled - Continue amiodarone 400 mg twice daily,  will decrease to 200 mg po daily on discharge - Continue Heparin drip, Hb down 11->, we will follow closely - If stable Hb will convert to DOAC (Eliquis 5 mg twice daily) tomorrow  - ContinuePlavix (load today) -likely stop aspirin on discharge    Ena Dawley, M.D.   Pager # [phone] Phone # [phone] [address] [place], [place] [address]

## 2018-04-11 NOTE — Progress Notes (Signed)
CARDIAC REHAB PHASE I   PRE:  Rate/Rhythm: Sinus Rhythm 70   BP:    Sitting: 110/65     SaO2: 99%  MODE:  Ambulation: 600  ft   POST:  Rate/Rhythem: 67  BP:    Sitting: 110/65     SaO2: 99% 1150-1225 Patient ambulated independently in the hallway with a rolling walker. Tolerated well. Patient assisted back to bed with call bell within reach. Wife present.Harrell Gave RN BSN

## 2018-04-11 NOTE — Progress Notes (Signed)
ANTICOAGULATION CONSULT NOTE  Pharmacy Consult for heparin Indication: atrial fibrillation  No Known Allergies  Patient Measurements: Height: 5\' 11"  (180.3 cm) Weight: 220 lb (99.8 kg) IBW/kg (Calculated) : 75.3 Heparin Dosing Weight: 95kg  Vital Signs: Temp: 98 F (36.7 C) (03/07 1033) Temp Source: Oral (03/07 1033) BP: 122/72 (03/07 1033) Pulse Rate: 77 (03/07 1033)  Labs: Recent Labs    04/09/18 0403 04/10/18 0241 04/10/18 1632 04/11/18 0424 04/11/18 1219  HGB 11.3*  --   --  9.9*  --   HCT 33.6*  --   --  29.2*  --   PLT 159  --   --  167  --   HEPARINUNFRC  --   --  0.22* 0.18* 0.36  CREATININE 1.18 1.05  --  0.82  --     Estimated Creatinine Clearance: 108.1 mL/min (by C-G formula based on SCr of 0.82 mg/dL).   Medical History: Past Medical History:  Diagnosis Date  . Anxiety   . Arthritis   . Back pain   . Coronary artery disease   . Diabetes mellitus without complication ([place])   . High cholesterol   . History of gout   . Hypertension   . Myocardial infarction ([place]) 04/2018  . Neuropathy   . 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 Sleep apnea    Stop Bang score of 4     Assessment: 49 yoM s/p STEMI now in new AFib. Pt noted to have hematoma post/cath which is now resolving and soft per RN, CBC is stable. Pt on no OAC PTA and has not received DVT prophylaxis post/cath. Pt on DAPT s/p stent, planning for transition to Plavix + DOAC once Hgb is stable.  Heparin level (0.36) within goal range this afternoon.  No overt bleeding or complications noted. Hgb down to 9.9.   Goal of Therapy:  Heparin level 0.3-0.7 units/ml Monitor platelets by anticoagulation protocol: Yes   Plan:  Continue IV heparin at 1700 units/hr. Check heparin level in 6 hrs. Daily heparin level and CBC.  Harrietta Guardian, PharmD PGY1 Pharmacy Resident 04/11/2018    1:17 PM Please check AMION for all [place] numbers

## 2018-04-11 NOTE — Progress Notes (Signed)
Dr. Meda Coffee requests DCCV for patient for Monday. I cannot schedule on the weekend but sent message to cardmaster to assess on Monday AM. I did orders under sign/held and also adjusted pt's Lantus to be given half dose tomorrow night in prep for being NPO.   DCCV is not mentioned in today's rounding note so will need statement indicating discussion of risks/benefits in tomorrow's round. Dayna Dunn PA-C

## 2018-04-11 NOTE — Progress Notes (Signed)
ANTICOAGULATION CONSULT NOTE - Follow Up Consult  Pharmacy Consult for heparin Indication: atrial fibrillation  Labs: Recent Labs    04/08/18 0816 04/09/18 0403 04/10/18 0241 04/10/18 1632 04/11/18 0424  HGB  --  11.3*  --   --  9.9*  HCT  --  33.6*  --   --  29.2*  PLT  --  159  --   --  167  HEPARINUNFRC  --   --   --  0.22* 0.18*  CREATININE  --  1.18 1.05  --  0.82  TROPONINI >65.00*  --   --   --   --     Assessment: 68yo male subtherapeutic on heparin with lower heparin level despite rate increase; no gtt issues or signs of bleeding per RN; Hgb down but RN states that hematoma is stable.  Goal of Therapy:  Heparin level 0.3-0.7 units/ml   Plan:  Will increase heparin gtt conservatively by 2 units/kg/hr to 1700 units/hr and check level in 6 hours.    Wynona Neat, PharmD, BCPS  04/11/2018,6:16 AM

## 2018-04-11 NOTE — Progress Notes (Signed)
ANTICOAGULATION CONSULT NOTE  Pharmacy Consult for heparin Indication: atrial fibrillation  No Known Allergies  Patient Measurements: Height: 5\' 11"  (180.3 cm) Weight: 220 lb (99.8 kg) IBW/kg (Calculated) : 75.3 Heparin Dosing Weight: 95kg  Vital Signs: Temp: 98 F (36.7 C) (03/07 1033) Temp Source: Oral (03/07 1033) BP: 138/90 (03/07 1702) Pulse Rate: 76 (03/07 1702)  Labs: Recent Labs    04/09/18 0403 04/10/18 0241  04/11/18 0424 04/11/18 1219 04/11/18 1805  HGB 11.3*  --   --  9.9*  --   --   HCT 33.6*  --   --  29.2*  --   --   PLT 159  --   --  167  --   --   HEPARINUNFRC  --   --    < > 0.18* 0.36 0.30  CREATININE 1.18 1.05  --  0.82  --   --    < > = values in this interval not displayed.    Estimated Creatinine Clearance: 108.1 mL/min (by C-G formula based on SCr of 0.82 mg/dL).   Medical History: Past Medical History:  Diagnosis Date  . Anxiety   . Arthritis   . Back pain   . Coronary artery disease   . Diabetes mellitus without complication ([place])   . High cholesterol   . History of gout   . Hypertension   . Myocardial infarction ([place]) 04/2018  . Neuropathy   . 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 Sleep apnea    Stop Bang score of 4   Assessment: 67 yoM s/p STEMI now in new AFib. Pt noted to have hematoma post/cath which is now resolving and soft per RN, CBC is stable. Pt on no OAC PTA and has not received DVT prophylaxis post/cath. Pt on DAPT s/p stent, planning for transition to Plavix + DOAC once Hgb is stable.  Heparin level is therapeutic at 0.3, on 1700 units/hr. Hgb 9.9, plt 167. No s/sx of bleeding. No infusion issues.   Goal of Therapy:  Heparin level 0.3-0.7 units/ml Monitor platelets by anticoagulation protocol: Yes   Plan:  Increase IV heparin at 1800 units/hr to keep in goal range Daily heparin level and CBC.  Antonietta Jewel, PharmD, [facility] Clinical Pharmacist  Pager: [phone] Phone:  [phone] 04/11/2018    6:56 PM Please check AMION for all [place] numbers

## 2018-04-12 LAB — GLUCOSE, CAPILLARY
Glucose-Capillary: 113 mg/dL — ABNORMAL HIGH (ref 70–99)
Glucose-Capillary: 216 mg/dL — ABNORMAL HIGH (ref 70–99)
Glucose-Capillary: 81 mg/dL (ref 70–99)
Glucose-Capillary: 143 mg/dL — ABNORMAL HIGH (ref 70–99)

## 2018-04-12 LAB — CBC
HCT: 30.9 % — ABNORMAL LOW (ref 39.0–52.0)
Hemoglobin: 10.3 g/dL — ABNORMAL LOW (ref 13.0–17.0)
MCH: 29.7 pg (ref 26.0–34.0)
MCHC: 33.3 g/dL (ref 30.0–36.0)
MCV: 89 fL (ref 80.0–100.0)
Platelets: 204 10*3/uL (ref 150–400)
RBC: 3.47 MIL/uL — ABNORMAL LOW (ref 4.22–5.81)
RDW: 12.9 % (ref 11.5–15.5)
WBC: 10.9 10*3/uL — ABNORMAL HIGH (ref 4.0–10.5)
nRBC: 0 % (ref 0.0–0.2)

## 2018-04-12 LAB — HEPARIN LEVEL (UNFRACTIONATED): Heparin Unfractionated: 0.45 IU/mL (ref 0.30–0.70)

## 2018-04-12 NOTE — Progress Notes (Signed)
Progress Note  Patient Name: RAND ETCHISON Date of Encounter: 04/12/2018  Primary Cardiologist: Dr. Ellyn Hack  Subjective   Remains in rate controlled atrial fibrillation,  No chest pain, improving pain in his groin.  Inpatient Medications    Scheduled Meds: . amiodarone  400 mg Oral BID  . aspirin EC  81 mg Oral Daily  . atorvastatin  40 mg Oral q1800  . carvedilol  3.125 mg Oral BID WC  . Chlorhexidine Gluconate Cloth  6 each Topical Daily  . Chlorhexidine Gluconate Cloth  6 each Topical Daily  . Chlorhexidine Gluconate Cloth  6 each Topical Q0600  . clopidogrel  75 mg Oral Daily  . DULoxetine  60 mg Oral QPM  . insulin aspart  0-15 Units Subcutaneous TID WC  . insulin aspart  0-5 Units Subcutaneous QHS  . insulin glargine  17 Units Subcutaneous QHS   And  . [START ON 04/13/2018] insulin glargine  35 Units Subcutaneous QHS  . insulin glargine  35 Units Subcutaneous QHS  . lisinopril  20 mg Oral Daily  . mupirocin ointment  1 application Nasal BID  . pregabalin  150 mg Oral BID  . rOPINIRole  1 mg Oral TID  . sodium chloride flush  10-40 mL Intracatheter Q12H  . sodium chloride flush  3 mL Intravenous Q12H  . timolol  1 drop Both Eyes BID   Continuous Infusions: . sodium chloride Stopped (04/08/18 1351)  . heparin 1,800 Units/hr (04/11/18 2013)   PRN Meds: sodium chloride, acetaminophen, morphine injection, ondansetron (ZOFRAN) IV, oxyCODONE, sodium chloride flush, sodium chloride flush   Vital Signs    Vitals:   04/11/18 1702 04/11/18 2118 04/12/18 0000 04/12/18 0615  BP: 138/90 (!) 160/85 113/60 (!) 141/74  Pulse: 76 70 77 77  Resp:  18 18 18   Temp:  98.3 F (36.8 C) 98.5 F (36.9 C) 98.1 F (36.7 C)  TempSrc:  Oral Oral Oral  SpO2: 99% 100% 96% 100%  Weight:    98.7 kg  Height:        Intake/Output Summary (Last 24 hours) at 04/12/2018 1126 Last data filed at 04/12/2018 0900 Gross per 24 hour  Intake 1280.58 ml  Output 1350 ml  Net -69.42 ml   Last 3  Weights 04/12/2018 04/11/2018 04/10/2018  Weight (lbs) 217 lb 9.6 oz 220 lb 223 lb  Weight (kg) 98.703 kg 99.791 kg 101.152 kg      Telemetry    Mostly sinus rhythm with frequent PVCs, but no further NSVT since yesterday at noon personally Reviewed  ECG    No repeat EKG- Personally Reviewed  Physical Exam   GEN: No acute distress.  Resting comfortably in the recliner.  Observed walking 2 laps with cardiac rehab, using his rolling walker.. Neck:  No JVD or carotid bruit Cardiac:  Rate controlled irregularly regular rhythm.  No M/R/G. Respiratory:  CTA B, nonlabored, good air movement GI:  Soft/NT/ND says any BS.  No extent. MS:  No clubbing cyanosis edema.  Right groin still has bruising and ecchymosis but no true firm hematoma.  Totally soft. Neuro:   Nonfocal, slow unsteady gait using walker. Psych: Normal mood and affect.  Labs    Chemistry Recent Labs  Lab 04/09/18 0403 04/10/18 0241 04/11/18 0424  NA 130* 134* 136  K 4.1 4.0 3.9  CL 102 106 104  CO2 21* 23 23  GLUCOSE 137* 127* 102*  BUN 27* 21 15  CREATININE 1.18 1.05 0.82  CALCIUM 8.0*  8.2* 8.3*  PROT 5.2* 5.3* 5.6*  ALBUMIN 3.0* 2.9* 2.7*  AST 367* 278* 103*  ALT 567* 592* 369*  ALKPHOS 61 58 60  BILITOT 0.4 0.6 0.8  GFRNONAA >60 >60 >60  GFRAA >60 >60 >60  ANIONGAP 7 5 9      Hematology Recent Labs  Lab 04/09/18 0403 04/11/18 0424 04/12/18 0623  WBC 14.6* 10.8* 10.9*  RBC 3.75* 3.29* 3.47*  HGB 11.3* 9.9* 10.3*  HCT 33.6* 29.2* 30.9*  MCV 89.6 88.8 89.0  MCH 30.1 30.1 29.7  MCHC 33.6 33.9 33.3  RDW 13.3 13.0 12.9  PLT 159 167 204    Cardiac Enzymes Recent Labs  Lab 04/07/18 1349 04/07/18 2050 04/08/18 0405 04/08/18 0816  TROPONINI 0.30* >65.00* >65.00* >65.00*   No results for input(s): TROPIPOC in the last 168 hours.   BNPNo results for input(s): BNP, PROBNP in the last 168 hours.   DDimer No results for input(s): DDIMER in the last 168 hours.   Radiology    Vas [place] Lower Extremity  Arterial Duplex  Result Date: 04/11/2018 LOWER EXTREMITY ARTERIAL DUPLEX STUDY Indications: Groin discoloration and tenderness post cardiac catheterization.  Current ABI: Not obtained Performing Technologist: Maudry Mayhew MHA, RDMS, RVT, RDCS  Examination Guidelines: A complete evaluation includes B-mode imaging, spectral Doppler, color Doppler, and power Doppler as needed of all accessible portions of each vessel. Bilateral testing is considered an integral part of a complete examination. Limited examinations for reoccurring indications may be performed as noted.  Summary: Right: No evidence of pseudoaneurysm involving the right common femoral artery. There is a heterogenous area that extends from lateral hip across groin area to the pubic area that is suggestive of a hematoma. The common femoral artery is patent with multiphasic flow. The common femoral vein is paten with no evidence of DVT.  See table(s) above for measurements and observations. Electronically signed by Monica Martinez MD on 04/11/2018 at 4:28:22 PM.    Final     Cardiac Studies    Cath: 04/07/2018   Prox RCA lesion is 100% stenosed.  A drug-eluting stent was successfully placed using a STENT RESOLUTE ONYX 3.5X30.  Post intervention, there is a 0% residual stenosis.  Ost LAD to Prox LAD lesion is 30% stenosed.Mid LAD lesion is 40% stenosed.  Ost 2nd Mrg to 2nd Mrg lesion is 40% stenosed.    1. 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 Ventricular fibrillation/cardiac arrest with successful resuscitation 3. Successful PTCA/DES x 1 proximal RCA.  4. Successful aspiration thrombectomy distal RCA 5. Mild to moderate non-obstructive disease in the Circumflex and LAD  Recommendations: Will admit to ICU. Echo in the am. Will continue Aggrastat for 18 hours. Will continue ASA/Brilinta and statin. Will not start a beta blocker today given hypotension and bradycardia. Ace inh on hold with  hypotension.    Echo 04/09/2018: LVEF 45-50%, global hypokinesis with more severe basal to mid inferior and inferoseptal hypokinesis and hyperdynamic apical function, incoordinate septal motion, temporary transvenous pacer noted in the IVC, mild RV systolic dysfunction, moderate LVH, MAC with trivial MR, dilated IVC    Patient Profile     68 y.o. male PMH of HL, HTN, IDDM, and OSA who presented with chest pain and CODE STEMI called in the field.   Was also noted to be in symptomatic bradycardia requiring temporary pacemaker (TPM).  Emergent cardiac cath revealed occluded RCA.  PCI of the RCA complicated by ventricular tachycardia requiring defibrillation x3.  DES  PCI to RCA.  Assessment & Plan    Principal Problem:   Acute ST elevation myocardial infarction (STEMI) of inferior wall (HCC) Active Problems:   Uncontrolled type 2 diabetes mellitus with complication, with long-term current use of insulin (HCC)   Hyperlipidemia associated with type 2 diabetes mellitus (HCC)   Essential hypertension, benign   Symptomatic bradycardia - - ischemic, s/p TPM   Coronary artery disease involving native coronary artery of native heart with unstable angina pectoris (HCC)   Presence of drug coated stent in right coronary artery   Ventricular tachycardia (HCC)   Hematoma of groin   Status post coronary artery stent placement   Paroxysmal atrial fibrillation ([place])  1. Inferior STEMI: s/p PCI/DES x1 to the pRCA 04/08/2018- (mild Ischemic Cardiomyopathy) Completed 18 hours of Aggrastat due to distal embolization;  significant LFT elevation in the setting of likely possible RV infarct and VT. - On DAPT - ASA/Brilinta. -->  Because of A. fib requiring DOAC, converted to Plavix (load 300 mg today).  Will stop aspirin on discharge. - tolerating beta-blocker, with reduced EF we will add spironolactone. - Restarting statin with LFTs improving. -  euvolemic with reduced EF.  Would like to put back on ARB prior to  discharge, but need to monitor blood pressures closely. - started PT, referral for an outpatient rehab  2. Bradycardia -resolved: Ischemic related.  Temp wire in place, but no paced beats on telemetry. Rates mostly in the 62s. -Temp wire removed post PCI; normal bradycardia  Low dose BB added due to ischemic NSVT.  3. VT: shocked 3x in the Cath Lab March 3 during cath/PCI --> placed on amiodarone and lidocaine drip added overnight for continued ectopy. Ectopy has improved.   Minimal episodes on carvedilol and amiodarone.  Increasing amiodarone because of A. fib.  4. HL: Initially holding statin because of elevated LFTs.    Starting statin today..  5. IDDM: followed by endocrinology as outpatient.  Hemoglobin A1c 6.7 in November  Lantus plus SSI, CBGs stable.  6. Elevated LFTs: suspect shock liver 2/2 to MI with RV infarct and VT.    Continuing downward trend.  Okay to start statin  7.  Right groin hematoma after sheath removal last night.  Had significant oozing requiring FemoStop placement.  Hematoma seems soft now.  Not unexpectedly roughly 1 unit hemoglobin dropped related to hematoma and cardiac cath.    Follow hemoglobin level in AM (since we are starting IV Heparin) . 8. New onset Afib - rate controlled - Continue amiodarone 400 mg twice daily, will decrease to 200 mg po daily on discharge - Continue Heparin drip, Hb down 11->, we will follow closely - If stable Hb will convert to DOAC (Eliquis 5 mg twice daily) tomorrow , Hb improved from 9.9 to 10.3 today - Continue Plavix (load today) -likely stop aspirin on discharge - DCCV tomorrow, no need for TEE, heparin was started with 24 hours after a-fib onset.    Ena Dawley, M.D.   Pager # [phone] Phone # [phone] [address] [place], [place] [address]

## 2018-04-12 NOTE — Evaluation (Signed)
Physical Therapy Evaluation Patient Details Name: Xavier Caldwell MRN: [id number] DOB: 04/19/1950 Today's Date: 04/12/2018   History of Present Illness  Patient is a 68 y/o male presenting with nausea and weakness. Code STEMI with emergent cardiac cath on 04/07/2018. PMH significant of HL, HTN, IDDM, and OSA. Possible DCCV on 04/13/2018.    Clinical Impression  Mr. Fillinger admitted with the above listed diagnosis. Patient reports needing assistance at baseline with patient ambulating with rollator vs SPC, and with wife assisting with ADLs and IADLs. Patient today requiring up to min guard for safety with mobility. Good tolerance to hallway ambulation with VSS throughout and no subjective complaints. Patient encouraged to continue to mobilize with nursing staff. Will recommend OPPT at discharge to continue to progress strength and balance. PT to continue to follow acutely.     Follow Up Recommendations Outpatient PT;Supervision for mobility/OOB    Equipment Recommendations  None recommended by PT    Recommendations for Other Services       Precautions / Restrictions Precautions Precautions: Fall Restrictions Weight Bearing Restrictions: No      Mobility  Bed Mobility Overal bed mobility: Modified Independent             General bed mobility comments: increased time and effort  Transfers Overall transfer level: Needs assistance Equipment used: Rolling walker (2 wheeled) Transfers: Sit to/from Stand Sit to Stand: Supervision         General transfer comment: for safety and immediate standing balance  Ambulation/Gait Ambulation/Gait assistance: Min guard;Supervision Gait Distance (Feet): (>500) Assistive device: 4-wheeled walker Gait Pattern/deviations: Step-through pattern;Decreased stride length;Trunk flexed Gait velocity: decreased   General Gait Details: reliant on rollator for support; reports a history of fall  Stairs            Wheelchair Mobility     Modified Rankin (Stroke Patients Only)       Balance Overall balance assessment: Mild deficits observed, not formally tested                                           Pertinent Vitals/Pain Pain Assessment: No/denies pain    Home Living Family/patient expects to be discharged to:: Private residence Living Arrangements: Spouse/significant other;Children Available Help at Discharge: Family;Available 24 hours/day Type of Home: House Home Access: Stairs to enter Entrance Stairs-Rails: None Entrance Stairs-Number of Steps: 1 Home Layout: One level Home Equipment: Walker - 4 wheels;Cane - single point      Prior Function Level of Independence: Needs assistance   Gait / Transfers Assistance Needed: use of rollator vs SPC  ADL's / Homemaking Assistance Needed: wife assist with shaving and assisting patient into tub; assist with meal prep and medication management        Hand Dominance        Extremity/Trunk Assessment   Upper Extremity Assessment Upper Extremity Assessment: Defer to OT evaluation    Lower Extremity Assessment Lower Extremity Assessment: Generalized weakness    Cervical / Trunk Assessment Cervical / Trunk Assessment: Normal  Communication   Communication: No difficulties  Cognition Arousal/Alertness: Awake/alert Behavior During Therapy: WFL for tasks assessed/performed Overall Cognitive Status: Within Functional Limits for tasks assessed  General Comments General comments (skin integrity, edema, etc.): patient reporting feeling far from baseline as he feels much weaker    Exercises     Assessment/Plan    PT Assessment Patient needs continued PT services  PT Problem List Decreased strength;Decreased balance;Decreased activity tolerance;Decreased mobility;Decreased knowledge of use of DME;Decreased safety awareness       PT Treatment Interventions DME instruction;Gait  training;Functional mobility training;Therapeutic activities;Therapeutic exercise;Balance training;Patient/family education    PT Goals (Current goals can be found in the Care Plan section)  Acute Rehab PT Goals Patient Stated Goal: regain strength PT Goal Formulation: With patient Time For Goal Achievement: 04/26/18 Potential to Achieve Goals: Good    Frequency Min 3X/week   Barriers to discharge        Co-evaluation               AM-PAC PT "6 Clicks" Mobility  Outcome Measure Help needed turning from your back to your side while in a flat bed without using bedrails?: None Help needed moving from lying on your back to sitting on the side of a flat bed without using bedrails?: A Little Help needed moving to and from a bed to a chair (including a wheelchair)?: A Little Help needed standing up from a chair using your arms (e.g., wheelchair or bedside chair)?: A Little Help needed to walk in hospital room?: A Little Help needed climbing 3-5 steps with a railing? : A Lot 6 Click Score: 18    End of Session Equipment Utilized During Treatment: Gait belt Activity Tolerance: Patient tolerated treatment well Patient left: in bed;with call bell/phone within reach;with family/visitor present Nurse Communication: Mobility status PT Visit Diagnosis: Unsteadiness on feet (R26.81);Other abnormalities of gait and mobility (R26.89);Muscle weakness (generalized) (M62.81);History of falling (Z91.81)    Time: 3559-7416 PT Time Calculation (min) (ACUTE ONLY): 27 min   Charges:   PT Evaluation $PT Eval Moderate Complexity: 1 Mod PT Treatments $Gait Training: 8-22 mins       Lanney Gins, PT, DPT Supplemental Physical Therapist 04/12/18 10:01 AM Pager: [phone] Office: [phone]

## 2018-04-12 NOTE — Progress Notes (Signed)
[facility] for heparin Indication: atrial fibrillation  No Known Allergies  Patient Measurements: Height: 5\' 11"  (180.3 cm) Weight: 217 lb 9.6 oz (98.7 kg) IBW/kg (Calculated) : 75.3 Heparin Dosing Weight: 95kg  Vital Signs: Temp: 98.1 F (36.7 C) (03/08 0615) Temp Source: Oral (03/08 0615) BP: 141/74 (03/08 0615) Pulse Rate: 77 (03/08 0615)  Labs: Recent Labs    04/10/18 0241  04/11/18 0424 04/11/18 1219 04/11/18 1805 04/12/18 0623  HGB  --   --  9.9*  --   --  10.3*  HCT  --   --  29.2*  --   --  30.9*  PLT  --   --  167  --   --  204  HEPARINUNFRC  --    < > 0.18* 0.36 0.30 0.45  CREATININE 1.05  --  0.82  --   --   --    < > = values in this interval not displayed.    Estimated Creatinine Clearance: 107.6 mL/min (by C-G formula based on SCr of 0.82 mg/dL).   Medical History: Past Medical History:  Diagnosis Date  . Anxiety   . Arthritis   . Back pain   . Coronary artery disease   . Diabetes mellitus without complication ([place])   . High cholesterol   . History of gout   . Hypertension   . Myocardial infarction ([place]) 04/2018  . Neuropathy   . 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 Sleep apnea    Stop Bang score of 4   Assessment: 26 yoM s/p STEMI now in new AFib. Pt noted to have hematoma post/cath which is now resolving and soft per RN, CBC is stable. Pt on no OAC PTA and has not received DVT prophylaxis post/cath. Pt on DAPT s/p stent, planning for transition to Plavix + DOAC once Hgb is stable.  Heparin level is therapeutic at 0.45, on 1800 units/hr. Hgb 10.3, plt 204. No s/sx of bleeding. No infusion issues.   Goal of Therapy:  Heparin level 0.3-0.7 units/ml Monitor platelets by anticoagulation protocol: Yes   Plan:  Continue IV heparin at 1800 units/hr Daily heparin level and CBC F/u transition to [place], PharmD PGY1 Pharmacy Resident 04/12/2018    9:13 AM Please  check AMION for all [place] numbers

## 2018-04-13 ENCOUNTER — Inpatient Hospital Stay (HOSPITAL_COMMUNITY): Payer: Medicare HMO | Admitting: Certified Registered Nurse Anesthetist

## 2018-04-13 ENCOUNTER — Encounter (HOSPITAL_COMMUNITY): Payer: Self-pay

## 2018-04-13 ENCOUNTER — Encounter (HOSPITAL_COMMUNITY)
Admission: EM | Disposition: A | Discharge status: Home / Self Care | Payer: Self-pay | Source: Home / Self Care | Attending: Cardiovascular Disease

## 2018-04-13 DIAGNOSIS — I480 Paroxysmal atrial fibrillation: Secondary | ICD-10-CM

## 2018-04-13 HISTORY — PX: CARDIOVERSION: SHX1299

## 2018-04-13 LAB — CBC
HCT: 31 % — ABNORMAL LOW (ref 39.0–52.0)
HCT: 31.5 % — ABNORMAL LOW (ref 39.0–52.0)
Hemoglobin: 10.4 g/dL — ABNORMAL LOW (ref 13.0–17.0)
Hemoglobin: 10.3 g/dL — ABNORMAL LOW (ref 13.0–17.0)
MCH: 29.8 pg (ref 26.0–34.0)
MCH: 29 pg (ref 26.0–34.0)
MCHC: 33.5 g/dL (ref 30.0–36.0)
MCHC: 32.7 g/dL (ref 30.0–36.0)
MCV: 88.8 fL (ref 80.0–100.0)
MCV: 88.7 fL (ref 80.0–100.0)
Platelets: 212 10*3/uL (ref 150–400)
Platelets: 234 10*3/uL (ref 150–400)
RBC: 3.49 MIL/uL — ABNORMAL LOW (ref 4.22–5.81)
RBC: 3.55 MIL/uL — AB (ref 4.22–5.81)
RDW: 12.9 % (ref 11.5–15.5)
RDW: 12.8 % (ref 11.5–15.5)
WBC: 11.9 10*3/uL — ABNORMAL HIGH (ref 4.0–10.5)
WBC: 9.4 10*3/uL (ref 4.0–10.5)
nRBC: 0 % (ref 0.0–0.2)
nRBC: 0 % (ref 0.0–0.2)

## 2018-04-13 LAB — GLUCOSE, CAPILLARY
GLUCOSE-CAPILLARY: 223 mg/dL — AB (ref 70–99)
Glucose-Capillary: 128 mg/dL — ABNORMAL HIGH (ref 70–99)
Glucose-Capillary: 131 mg/dL — ABNORMAL HIGH (ref 70–99)
Glucose-Capillary: 103 mg/dL — ABNORMAL HIGH (ref 70–99)

## 2018-04-13 LAB — HEPARIN LEVEL (UNFRACTIONATED): Heparin Unfractionated: 0.52 IU/mL (ref 0.30–0.70)

## 2018-04-13 SURGERY — CARDIOVERSION
Anesthesia: General

## 2018-04-13 MED ORDER — APIXABAN 5 MG PO TABS
5.0000 mg | ORAL_TABLET | Freq: Two times a day (BID) | ORAL | Status: DC
  Filled 2018-04-13: qty 1

## 2018-04-13 MED ORDER — HEPARIN (PORCINE) 25000 UT/250ML-% IV SOLN
1800.0000 [IU]/h | INTRAVENOUS | Status: AC
  Administered 2018-04-13: 1800 [IU]/h via INTRAVENOUS

## 2018-04-13 MED ORDER — SODIUM CHLORIDE 0.9 % IV SOLN
250.0000 mL | INTRAVENOUS | Status: DC
  Administered 2018-04-13: 250 mL via INTRAVENOUS

## 2018-04-13 MED ORDER — APIXABAN 5 MG PO TABS
5.0000 mg | ORAL_TABLET | Freq: Two times a day (BID) | ORAL | Status: DC
  Administered 2018-04-13 – 2018-04-14 (×3): 5 mg via ORAL
  Filled 2018-04-13 (×2): qty 1

## 2018-04-13 MED ORDER — DOCUSATE SODIUM 100 MG PO CAPS
100.0000 mg | ORAL_CAPSULE | Freq: Two times a day (BID) | ORAL | Status: DC
  Administered 2018-04-13 – 2018-04-14 (×3): 100 mg via ORAL
  Filled 2018-04-13 (×3): qty 1

## 2018-04-13 MED ORDER — LIDOCAINE HCL (CARDIAC) PF 100 MG/5ML IV SOSY
PREFILLED_SYRINGE | INTRAVENOUS | Status: DC | PRN
  Administered 2018-04-13: 20 mg via INTRAVENOUS

## 2018-04-13 MED ORDER — SODIUM CHLORIDE 0.9% FLUSH: 3.0000 mL | INTRAVENOUS | Status: DC | PRN

## 2018-04-13 MED ORDER — PROPOFOL 10 MG/ML IV BOLUS
INTRAVENOUS | Status: DC | PRN
  Administered 2018-04-13: 70 mg via INTRAVENOUS

## 2018-04-13 MED ORDER — SODIUM CHLORIDE 0.9% FLUSH
3.0000 mL | Freq: Two times a day (BID) | INTRAVENOUS | Status: DC
  Administered 2018-04-13 – 2018-04-14 (×2): 3 mL via INTRAVENOUS

## 2018-04-13 MED ORDER — APIXABAN 5 MG PO TABS: 5.0000 mg | ORAL_TABLET | Freq: Two times a day (BID) | ORAL | Status: DC

## 2018-04-13 NOTE — Progress Notes (Signed)
    Called to patient room for increased groin swelling post DCCV. Had post procedure right groin hematoma after cath. Vas ultrasound performed which was found to be negative for pseudoaneurysm. VSS. Pt went for DCCV for atrial fibrillation earlier today. Reports an increase in groin swelling since arrival back to room. On my assessment, there atre old marking from previous groin bleed. He has areas of bruising down his thigh and into his groin as well as up his lower abdomen. There is an area of hardening, however it is difficult to determine if this is continual re-absorption from prior hematoma or new. I have ordered another vascular ultrasound for comparison and will check a CBC. I held manuual pressure for 10 minutes. He is off IV Hep post DCCV. Asked RN to mark new area and call if area grows. Will re-assess in the AM if no change.     Kathyrn Drown NP-C [place] Pager: [phone]

## 2018-04-13 NOTE — Anesthesia Postprocedure Evaluation (Signed)
Anesthesia Post Note  Patient: Xavier Caldwell  Procedure(s) Performed: CARDIOVERSION (N/A )     Patient location during evaluation: PACU Anesthesia Type: General Level of consciousness: awake and alert Pain management: pain level controlled Vital Signs Assessment: post-procedure vital signs reviewed and stable Respiratory status: spontaneous breathing, nonlabored ventilation, respiratory function stable and patient connected to nasal cannula oxygen Cardiovascular status: blood pressure returned to baseline and stable Postop Assessment: no apparent nausea or vomiting Anesthetic complications: no    Last Vitals:  Vitals:   04/13/18 1400 04/13/18 1417  BP: (!) 150/76 (!) 154/68  Pulse: 65 67  Resp: 14 19  Temp:    SpO2: 97% 97%    Last Pain:  Vitals:   04/13/18 1417  TempSrc:   PainSc: 0-No pain                 Tiajuana Amass

## 2018-04-13 NOTE — Anesthesia Preprocedure Evaluation (Signed)
Anesthesia Evaluation  Patient identified by MRN, date of birth, ID band Patient awake    Reviewed: Allergy & Precautions, NPO status , Patient's Chart, lab work & pertinent test results  Airway Mallampati: III  TM Distance: >3 FB     Dental   Pulmonary sleep apnea , former smoker,    breath sounds clear to auscultation       Cardiovascular hypertension, Pt. on medications and Pt. on home beta blockers + angina + CAD, + Past MI, + Cardiac Stents and +CHF   Rhythm:Irregular Rate:Normal     Neuro/Psych negative neurological ROS     GI/Hepatic negative GI ROS, Neg liver ROS,   Endo/Other  diabetes, Type 2, Insulin Dependent  Renal/GU negative Renal ROS     Musculoskeletal  (+) Arthritis ,   Abdominal   Peds  Hematology  (+) anemia ,   Anesthesia Other Findings   Reproductive/Obstetrics                             Anesthesia Physical Anesthesia Plan  ASA: III  Anesthesia Plan: General   Post-op Pain Management:    Induction: Intravenous  PONV Risk Score and Plan: 2 and Treatment may vary due to age or medical condition  Airway Management Planned: Mask and Natural Airway  Additional Equipment:   Intra-op Plan:   Post-operative Plan:   Informed Consent: I have reviewed the patients History and Physical, chart, labs and discussed the procedure including the risks, benefits and alternatives for the proposed anesthesia with the patient or authorized representative who has indicated his/her understanding and acceptance.       Plan Discussed with:   Anesthesia Plan Comments:         Anesthesia Quick Evaluation

## 2018-04-13 NOTE — Transfer of Care (Signed)
Immediate Anesthesia Transfer of Care Note  Patient: Xavier Caldwell  Procedure(s) Performed: CARDIOVERSION (N/A )  Patient Location: Endoscopy Unit  Anesthesia Type:General  Level of Consciousness: awake, alert , oriented and patient cooperative  Airway & Oxygen Therapy: Patient Spontanous Breathing and Patient connected to nasal cannula oxygen  Post-op Assessment: Report given to RN and Post -op Vital signs reviewed and stable  Post vital signs: Reviewed and stable  Last Vitals:  Vitals Value Taken Time  BP    Temp    Pulse    Resp    SpO2      Last Pain:  Vitals:   04/13/18 1203  TempSrc: Oral  PainSc:       Patients Stated Pain Goal: 0 (17/91/50 5697)  Complications: No apparent anesthesia complications

## 2018-04-13 NOTE — Progress Notes (Signed)
Pt asked I look at Rt groin, noticed increased swelling and a knot which was wasn't there this morning. NP on call made aware. Will continue to monitor. Site marked.

## 2018-04-13 NOTE — Progress Notes (Addendum)
ANTICOAGULATION CONSULT NOTE  Pharmacy Consult for heparin Indication: atrial fibrillation  No Known Allergies  Patient Measurements: Height: 5\' 11"  (180.3 cm) Weight: 214 lb 11.2 oz (97.4 kg) IBW/kg (Calculated) : 75.3 Heparin Dosing Weight: 95kg  Vital Signs: Temp: 97.8 F (36.6 C) (03/09 0549) Temp Source: Oral (03/09 0549) BP: 112/76 (03/09 0549) Pulse Rate: 64 (03/09 0549)  Labs: Recent Labs    04/11/18 0424  04/11/18 1805 04/12/18 0623 04/13/18 0450  HGB 9.9*  --   --  10.3* 10.4*  HCT 29.2*  --   --  30.9* 31.0*  PLT 167  --   --  204 212  HEPARINUNFRC 0.18*   < > 0.30 0.45 0.52  CREATININE 0.82  --   --   --   --    < > = values in this interval not displayed.    Estimated Creatinine Clearance: 106.8 mL/min (by C-G formula based on SCr of 0.82 mg/dL).   Medical History: Past Medical History:  Diagnosis Date  . Anxiety   . Arthritis   . Back pain   . Coronary artery disease   . Diabetes mellitus without complication ([place])   . High cholesterol   . History of gout   . Hypertension   . Myocardial infarction ([place]) 04/2018  . Neuropathy   . 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 Sleep apnea    Stop Bang score of 4   Assessment: 33 yoM s/p STEMI now in new AFib. Pt noted to have hematoma post/cath which is now resolving and soft per RN, CBC is stable. Pt on no OAC PTA and has not received DVT prophylaxis post/cath. Pt on DAPT s/p stent, planning for transition to Plavix + DOAC once Hgb is stable.  Heparin level is therapeutic at 0.52, CBC stable. Planning for DCCV today with eventual transition to [place].  Goal of Therapy:  Heparin level 0.3-0.7 units/ml Monitor platelets by anticoagulation protocol: Yes   Plan:  -Continue IV heparin at 1800 units/hr -Daily heparin level and CBC -F/u transition to DOAC   ADDENDUM: Will plan to transition to apixaban after DCCV today. Continue heparin infusion for now then give first dose of  apixaban 5mg  BID after DCCV and stop heparin infusion.   Arrie Senate, PharmD, BCPS Clinical Pharmacist [phone] Please check AMION for all [facility] numbers 04/13/2018

## 2018-04-13 NOTE — Progress Notes (Addendum)
Progress Note  Patient Name: Xavier Caldwell Date of Encounter: 04/13/2018  Primary Cardiologist: No primary care provider on file.   Subjective   Feeling well this morning. No chest pain. Still in Afib.   Inpatient Medications    Scheduled Meds: . amiodarone  400 mg Oral BID  . aspirin EC  81 mg Oral Daily  . atorvastatin  40 mg Oral q1800  . Chlorhexidine Gluconate Cloth  6 each Topical Daily  . Chlorhexidine Gluconate Cloth  6 each Topical Daily  . Chlorhexidine Gluconate Cloth  6 each Topical Q0600  . clopidogrel  75 mg Oral Daily  . DULoxetine  60 mg Oral QPM  . insulin aspart  0-15 Units Subcutaneous TID WC  . insulin aspart  0-5 Units Subcutaneous QHS  . insulin glargine  35 Units Subcutaneous QHS  . lisinopril  20 mg Oral Daily  . mupirocin ointment  1 application Nasal BID  . pregabalin  150 mg Oral BID  . rOPINIRole  1 mg Oral TID  . sodium chloride flush  10-40 mL Intracatheter Q12H  . sodium chloride flush  3 mL Intravenous Q12H  . timolol  1 drop Both Eyes BID   Continuous Infusions: . sodium chloride Stopped (04/08/18 1351)  . heparin 1,800 Units/hr (04/11/18 2013)   PRN Meds: sodium chloride, acetaminophen, morphine injection, ondansetron (ZOFRAN) IV, oxyCODONE, sodium chloride flush, sodium chloride flush   Vital Signs    Vitals:   04/12/18 1430 04/12/18 2040 04/13/18 0547 04/13/18 0549  BP: 137/74 (!) 159/73  112/76  Pulse: 67 70  64  Resp: _0 Temp: 97.8 F (36.6 C) 97.9 F (36.6 C)  97.8 F (36.6 C)  TempSrc: Oral Oral  Oral  SpO2: 99% 99%  99%  Weight:   97.4 kg   Height:        Intake/Output Summary (Last 24 hours) at 04/13/2018 0815 Last data filed at 04/13/2018 0551 Gross per 24 hour  Intake 480 ml  Output 1600 ml  Net -1120 ml   Last 3 Weights 04/13/2018 04/12/2018 04/11/2018  Weight (lbs) 214 lb 11.2 oz 217 lb 9.6 oz 220 lb  Weight (kg) 97.387 kg 98.703 kg 99.791 kg      Telemetry    Afib rate 45-50 - Personally  Reviewed  ECG    N/a - Personally Reviewed  Physical Exam   GEN: No acute distress.   Neck: No JVD Cardiac: Irreg Irreg, no murmurs, rubs, or gallops.  Respiratory: Clear to auscultation bilaterally. GI: Soft, nontender, non-distended  MS: No edema; No deformity. Right groin with ecchymosis extending down to upper thigh, does not extend pass previously marked area. Neuro:  Nonfocal  Psych: Normal affect   Labs    Chemistry Recent Labs  Lab 04/09/18 0403 04/10/18 0241 04/11/18 0424  NA 130* 134* 136  K 4.1 4.0 3.9  CL 102 106 104  CO2 21* 23 23  GLUCOSE 137* 127* 102*  BUN 27* 21 15  CREATININE 1.18 1.05 0.82  CALCIUM 8.0* 8.2* 8.3*  PROT 5.2* 5.3* 5.6*  ALBUMIN 3.0* 2.9* 2.7*  AST 367* 278* 103*  ALT 567* 592* 369*  ALKPHOS 61 58 60  BILITOT 0.4 0.6 0.8  GFRNONAA >60 >60 >60  GFRAA >60 >60 >60  ANIONGAP _1 Hematology Recent Labs  Lab 04/11/18 0424 04/12/18 0623 04/13/18 0450  WBC 10.8* 10.9* 11.9*  RBC 3.29* 3.47* 3.49*  HGB 9.9* 10.3* 10.4*  HCT 29.2* 30.9* 31.0*  MCV 88.8 89.0 88.8  MCH 30.1 29.7 29.8  MCHC 33.9 33.3 33.5  RDW 13.0 12.9 12.9  PLT 167 204 212    Cardiac Enzymes Recent Labs  Lab 04/07/18 1349 04/07/18 2050 04/08/18 0405 04/08/18 0816  TROPONINI 0.30* >65.00* >65.00* >65.00*   No results for input(s): TROPIPOC in the last 168 hours.   BNPNo results for input(s): BNP, PROBNP in the last 168 hours.   DDimer No results for input(s): DDIMER in the last 168 hours.   Radiology    Vas [place] Lower Extremity Arterial Duplex  Result Date: 04/11/2018 LOWER EXTREMITY ARTERIAL DUPLEX STUDY Indications: Groin discoloration and tenderness post cardiac catheterization.  Current ABI: Not obtained Performing Technologist: Maudry Mayhew MHA, RDMS, RVT, RDCS  Examination Guidelines: A complete evaluation includes B-mode imaging, spectral Doppler, color Doppler, and power Doppler as needed of all accessible portions of each vessel.  Bilateral testing is considered an integral part of a complete examination. Limited examinations for reoccurring indications may be performed as noted.  Summary: Right: No evidence of pseudoaneurysm involving the right common femoral artery. There is a heterogenous area that extends from lateral hip across groin area to the pubic area that is suggestive of a hematoma. The common femoral artery is patent with multiphasic flow. The common femoral vein is paten with no evidence of DVT.  See table(s) above for measurements and observations. Electronically signed by Monica Martinez MD on 04/11/2018 at 4:28:22 PM.    Final     Cardiac Studies   Cath: 04/07/2018   Prox RCA lesion is 100% stenosed.  A drug-eluting stent was successfully placed using a STENT RESOLUTE ONYX 3.5X30.  Post intervention, there is a 0% residual stenosis.  Ost LAD to Prox LAD lesion is 30% stenosed.Mid LAD lesion is 40% stenosed.  Ost 2nd Mrg to 2nd Mrg lesion is 40% stenosed.   1. 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 Ventricular fibrillation/cardiac arrest with successful resuscitation 3. Successful PTCA/DES x 1 proximal RCA.  4. Successful aspiration thrombectomy distal RCA 5. Mild to moderate non-obstructive disease in the Circumflex and LAD  Recommendations: Will admit to ICU. Echo in the am. Will continue Aggrastat for 18 hours. Will continue ASA/Brilinta and statin. Will not start a beta blocker today given hypotension and bradycardia. Ace inh on hold with hypotension.   Echo 04/09/2018: LVEF 45-50%, global hypokinesis with more severe basal to mid inferior and inferoseptal hypokinesis and hyperdynamic apical function, incoordinate septal motion, temporary transvenous pacer noted in the IVC, mild RV systolic dysfunction, moderate LVH, MAC with trivial MR, dilated IVC  Patient Profile     68 y.o. male PMH of HL, HTN, IDDM, and OSA who presented with chest pain and CODE  STEMI called in the field.  Was also noted to be in symptomatic bradycardia requiring temporary pacemaker (TPM).  Emergent cardiac cath revealed occluded RCA.  PCI of the RCA complicated by ventricular tachycardia requiring defibrillation x3.  DES PCI to RCA.  Assessment & Plan    1. Inferior STEMI: s/p PCI/DES x1 to the [facility] 04/08/2018. Completed 18 hours of Aggrastat due to distal embolization;  - On DAPT - ASA/Brilinta. -->  Because of A. fib requiring DOAC, converted to Plavix.  Triple therapy with plans to stop ASA at discharge.  - tolerating beta-blocker, but will hold this morning as rate in the 45-50 range and planned for DCCV.  -  euvolemic with reduced EF.  Consider  adding back ACE/ARB prior to discharge.  2. Bradycardia -resolved: Temp wire placed in the lab, removed on 3/5.  - holding dose BB today with plans for DCCV and bradycardia.  3. VT: shocked 3x in the Cath Lab March 3 during cath/PCI --> placed on amiodarone and lidocaine drip added overnight for continued ectopy. Ectopy has improved.  - Minimal episodes on carvedilol and amiodarone. Amiodarone at 462m BID currently. Plan to reduce at discharge.  4. HL: Initially holding statin because of elevated LFTs. Now on statin with LFTs improving.  5. IDDM: followed by endocrinology as outpatient.  Hemoglobin A1c 6.7 in November - Lantus plus SSI, CBGs stable.  6. Elevated LFTs: suspect shock liver 2/2 to MI with RV infarct and VT.   - Continuing downward trend, will recheck in the morning  7.  Right groin hematoma after sheath removal with the requirement of FemoStop. Hgb stable. . 8. New onset Afib - rate controlled - Continue amiodarone 400 mg twice daily, will decrease to 200 mg po daily on dischargeclosely - Will convert to DOAC (Eliquis 5 mg twice daily) today.  - added on for DCCV today (likely this afternoon), no need for TEE, heparin was started with 24 hours after a-fib onset.   For questions or updates,  please contact [place] consult [email or web address] for contact info under   Signed, LReino Bellis NP  04/13/2018, 8:15 AM     Agree with note by LReino BellisNP-C  Mr. WPrettymanis postop day #6 inferior STEMI complicated by complete heart block, bradycardia and ventricular tachycardia requiring cardioversion.  He did have a temporary transvenous pacemaker placed at the time of the procedure.  He has mild LV dysfunction with an EF of 45 to 50%.  He is currently in A. fib with a slow ventricular spots on p.o. amiodarone and is scheduled for DC cardioversion today.  He has been on heparin since going into A. fib.  Ultimately, he will be started on a DO AC and triple therapy for a month after which aspirin can be discontinued.  He is clinically stable and asymptomatic.  His exam is benign.  JLorretta Harp M.D., [place] [facility] FLaverta [place]FL[place] [place]  [phone]/10/2018 10:38 AM

## 2018-04-13 NOTE — CV Procedure (Signed)
Electrical Cardioversion Procedure Note KAREL TURPEN [id number] 1950-02-11  Procedure: Electrical Cardioversion Indications:  Atrial Fibrillation  Procedure Details Consent: Risks of procedure as well as the alternatives and risks of each were explained to the (patient/caregiver).  Consent for procedure obtained. Time Out: Verified patient identification, verified procedure, site/side was marked, verified correct patient position, special equipment/implants available, medications/allergies/relevent history reviewed, required imaging and test results available.  Performed  Patient placed on cardiac monitor, pulse oximetry, supplemental oxygen as necessary.  Sedation given: propofol Pacer pads placed anterior and posterior chest.  Cardioverted 2 time(s).  Cardioverted at 150J unsuccessful.  200J successful.  Evaluation Findings: Post procedure EKG shows: NSR Complications: None Patient did tolerate procedure well.   Skeet Latch, MD 04/13/2018, 2:13 PM
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
## 2018-04-14 ENCOUNTER — Inpatient Hospital Stay (HOSPITAL_COMMUNITY): Payer: Medicare HMO

## 2018-04-14 ENCOUNTER — Encounter (HOSPITAL_COMMUNITY): Payer: Self-pay | Admitting: Cardiology

## 2018-04-14 ENCOUNTER — Other Ambulatory Visit: Payer: Self-pay | Admitting: Cardiology

## 2018-04-14 DIAGNOSIS — D649 Anemia, unspecified: Secondary | ICD-10-CM

## 2018-04-14 DIAGNOSIS — S301XXA Contusion of abdominal wall, initial encounter: Secondary | ICD-10-CM

## 2018-04-14 DIAGNOSIS — S301XXD Contusion of abdominal wall, subsequent encounter: Secondary | ICD-10-CM

## 2018-04-14 LAB — CBC
HCT: 30.1 % — ABNORMAL LOW (ref 39.0–52.0)
Hemoglobin: 10.1 g/dL — ABNORMAL LOW (ref 13.0–17.0)
MCH: 29.9 pg (ref 26.0–34.0)
MCHC: 33.6 g/dL (ref 30.0–36.0)
MCV: 89.1 fL (ref 80.0–100.0)
NRBC: 0 % (ref 0.0–0.2)
Platelets: 234 10*3/uL (ref 150–400)
RBC: 3.38 MIL/uL — ABNORMAL LOW (ref 4.22–5.81)
RDW: 13 % (ref 11.5–15.5)
WBC: 10.3 10*3/uL (ref 4.0–10.5)

## 2018-04-14 LAB — HEPATIC FUNCTION PANEL
ALT: 137 U/L — AB (ref 0–44)
AST: 23 U/L (ref 15–41)
Albumin: 2.9 g/dL — ABNORMAL LOW (ref 3.5–5.0)
Alkaline Phosphatase: 58 U/L (ref 38–126)
Bilirubin, Direct: 0.2 mg/dL (ref 0.0–0.2)
Indirect Bilirubin: 0.7 mg/dL (ref 0.3–0.9)
Total Bilirubin: 0.9 mg/dL (ref 0.3–1.2)
Total Protein: 5.7 g/dL — ABNORMAL LOW (ref 6.5–8.1)

## 2018-04-14 LAB — BASIC METABOLIC PANEL
Anion gap: 8 (ref 5–15)
BUN: 15 mg/dL (ref 8–23)
CO2: 24 mmol/L (ref 22–32)
Calcium: 8.8 mg/dL — ABNORMAL LOW (ref 8.9–10.3)
Chloride: 102 mmol/L (ref 98–111)
Creatinine, Ser: 1.01 mg/dL (ref 0.61–1.24)
GFR calc Af Amer: >60 mL/min (ref >60)
GFR calc non Af Amer: >60 mL/min (ref >60)
Glucose, Bld: 115 mg/dL — ABNORMAL HIGH (ref 70–99)
POTASSIUM: 4.5 mmol/L (ref 3.5–5.1)
Sodium: 134 mmol/L — ABNORMAL LOW (ref 135–145)

## 2018-04-14 LAB — GLUCOSE, CAPILLARY
Glucose-Capillary: 119 mg/dL — ABNORMAL HIGH (ref 70–99)
Glucose-Capillary: 155 mg/dL — ABNORMAL HIGH (ref 70–99)

## 2018-04-14 LAB — MAGNESIUM: Magnesium: 1.7 mg/dL (ref 1.7–2.4)

## 2018-04-14 MED ORDER — CLOPIDOGREL BISULFATE 75 MG PO TABS: 75.0000 mg | ORAL_TABLET | Freq: Every day | ORAL | 2 refills | Status: DC

## 2018-04-14 MED ORDER — AMIODARONE HCL 200 MG PO TABS: 200.0000 mg | ORAL_TABLET | Freq: Every day | ORAL | 1 refills | Status: DC

## 2018-04-14 MED ORDER — ATORVASTATIN CALCIUM 40 MG PO TABS: 40.0000 mg | ORAL_TABLET | Freq: Every day | ORAL | 1 refills | Status: DC

## 2018-04-14 MED ORDER — NITROGLYCERIN 0.4 MG SL SUBL: 0.4000 mg | SUBLINGUAL_TABLET | SUBLINGUAL | 2 refills | Status: DC | PRN

## 2018-04-14 MED ORDER — MAGNESIUM SULFATE 2 GM/50ML IV SOLN
2.0000 g | Freq: Once | INTRAVENOUS | Status: AC
  Administered 2018-04-14: 2 g via INTRAVENOUS
  Filled 2018-04-14: qty 50

## 2018-04-14 MED ORDER — APIXABAN 5 MG PO TABS: 5.0000 mg | ORAL_TABLET | Freq: Two times a day (BID) | ORAL | 1 refills | Status: DC

## 2018-04-14 MED ORDER — LISINOPRIL 20 MG PO TABS: 20.0000 mg | ORAL_TABLET | Freq: Every day | ORAL | 1 refills | Status: DC

## 2018-04-14 MED FILL — ELIQUIS 5 MG TABLET: 5 | 30 days supply | Qty: 60 | Fill #0 | Status: TO

## 2018-04-14 MED FILL — LISINOPRIL 20 MG TABLET: 20 | 30 days supply | Qty: 30 | Fill #0 | Status: TO

## 2018-04-14 MED FILL — AMIODARONE HCL 200 MG TAB: 200 | 30 days supply | Qty: 30 | Fill #0 | Status: TO

## 2018-04-14 MED FILL — CLOPIDOGREL 75 MG TABLET: 75 | 30 days supply | Qty: 30 | Fill #0 | Status: TO

## 2018-04-14 MED FILL — NITROGLYCERIN 0.4 MG TAB SL: 0.4 | 8 days supply | Qty: 25 | Fill #0 | Status: TO

## 2018-04-14 MED FILL — ATORVASTATIN CALCIUM 40 MG: 40 | 30 days supply | Qty: 30 | Fill #0 | Status: TO

## 2018-04-14 NOTE — Progress Notes (Signed)
Limited lower extremity arterial duplex completed. Refer to "CV Proc" under chart review to view preliminary results.  04/14/2018 11:58 AM Maudry Mayhew, MHA, RVT, RDCS, RDMS

## 2018-04-14 NOTE — Discharge Instructions (Signed)

## 2018-04-14 NOTE — Progress Notes (Addendum)
Progress Note  Patient Name: Xavier Caldwell Date of Encounter: 04/14/2018  Primary Cardiologist: No primary care provider on file.   Subjective   No complaints this morning. Notes indicate swelling in right groin last evening.   Inpatient Medications    Scheduled Meds: . amiodarone  400 mg Oral BID  . aspirin EC  81 mg Oral Daily  . atorvastatin  40 mg Oral q1800  . Chlorhexidine Gluconate Cloth  6 each Topical Daily  . Chlorhexidine Gluconate Cloth  6 each Topical Daily  . Chlorhexidine Gluconate Cloth  6 each Topical Q0600  . clopidogrel  75 mg Oral Daily  . docusate sodium  100 mg Oral BID  . DULoxetine  60 mg Oral QPM  . insulin aspart  0-15 Units Subcutaneous TID WC  . insulin aspart  0-5 Units Subcutaneous QHS  . insulin glargine  35 Units Subcutaneous QHS  . lisinopril  20 mg Oral Daily  . mupirocin ointment  1 application Nasal BID  . pregabalin  150 mg Oral BID  . rOPINIRole  1 mg Oral TID  . sodium chloride flush  10-40 mL Intracatheter Q12H  . sodium chloride flush  3 mL Intravenous Q12H  . sodium chloride flush  3 mL Intravenous Q12H  . timolol  1 drop Both Eyes BID   Continuous Infusions: . sodium chloride Stopped (04/08/18 1351)  . sodium chloride 250 mL (04/13/18 1302)   PRN Meds: sodium chloride, acetaminophen, morphine injection, ondansetron (ZOFRAN) IV, oxyCODONE, sodium chloride flush, sodium chloride flush, sodium chloride flush   Vital Signs    Vitals:   04/13/18 1400 04/13/18 1417 04/13/18 2050 04/14/18 0606  BP: (!) 150/76 (!) 154/68 (!) 162/64 (!) 132/54  Pulse: 65 67 70 67  Resp: _0 Temp:   98.3 F (36.8 C) 97.8 F (36.6 C)  TempSrc:   Oral Oral  SpO2: 97% 97% 98% 98%  Weight:    95.8 kg  Height:        Intake/Output Summary (Last 24 hours) at 04/14/2018 1052 Last data filed at 04/13/2018 2142 Gross per 24 hour  Intake 623.06 ml  Output 200 ml  Net 423.06 ml   Last 3 Weights 04/14/2018 04/13/2018 04/12/2018  Weight (lbs) 211  lb 1.6 oz 214 lb 11.2 oz 217 lb 9.6 oz  Weight (kg) 95.754 kg 97.387 kg 98.703 kg      Telemetry    SR - Personally Reviewed  ECG    N/a - Personally Reviewed  Physical Exam   GEN: No acute distress.   Neck: No JVD Cardiac: RRR, no murmurs, rubs, or gallops.  Respiratory: Clear to auscultation bilaterally. GI: Soft, nontender, non-distended  MS: No edema; No deformity. Right groin with sizeable area of harden hematoma. Does not extend outside of marked area from last. Tracks back posteriorly. DP pulses noted.   Neuro:  Nonfocal  Psych: Normal affect   Labs    Chemistry Recent Labs  Lab 04/10/18 0241 04/11/18 0424 04/14/18 0457  NA 134* 136 134*  K 4.0 3.9 4.5  CL 106 104 102  CO2 _1 GLUCOSE 127* 102* 115*  BUN _2 CREATININE 1.05 0.82 1.01  CALCIUM 8.2* 8.3* 8.8*  PROT 5.3* 5.6* 5.7*  ALBUMIN 2.9* 2.7* 2.9*  AST 278* 103* 23  ALT 592* 369* 137*  ALKPHOS 58 60 58  BILITOT 0.6 0.8 0.9  GFRNONAA >60 >60 >60  GFRAA >60 >60 >60  ANIONGAP  _0 Hematology Recent Labs  Lab 04/13/18 0450 04/13/18 1826 04/14/18 0457  WBC 11.9* 9.4 10.3  RBC 3.49* 3.55* 3.38*  HGB 10.4* 10.3* 10.1*  HCT 31.0* 31.5* 30.1*  MCV 88.8 88.7 89.1  MCH 29.8 29.0 29.9  MCHC 33.5 32.7 33.6  RDW 12.9 12.8 13.0  PLT 212 234 234    Cardiac Enzymes Recent Labs  Lab 04/07/18 1349 04/07/18 2050 04/08/18 0405 04/08/18 0816  TROPONINI 0.30* >65.00* >65.00* >65.00*   No results for input(s): TROPIPOC in the last 168 hours.   BNPNo results for input(s): BNP, PROBNP in the last 168 hours.   DDimer No results for input(s): DDIMER in the last 168 hours.   Radiology    No results found.  Cardiac Studies   Cath: 04/07/2018   Prox RCA lesion is 100% stenosed.  A drug-eluting stent was successfully placed using a STENT RESOLUTE ONYX 3.5X30.  Post intervention, there is a 0% residual stenosis.  Ost LAD to Prox LAD lesion is 30% stenosed.Mid LAD lesion is 40%  stenosed.  Ost 2nd Mrg to 2nd Mrg lesion is 40% stenosed.   1. 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 Ventricular fibrillation/cardiac arrest with successful resuscitation 3. Successful PTCA/DES x 1 proximal RCA.  4. Successful aspiration thrombectomy distal RCA 5. Mild to moderate non-obstructive disease in the Circumflex and LAD  Recommendations: Will admit to ICU. Echo in the am. Will continue Aggrastat for 18 hours. Will continue ASA/Brilinta and statin. Will not start a beta blocker today given hypotension and bradycardia. Ace inh on hold with hypotension.   Echo 04/09/2018:LVEF 45-50%, global hypokinesis with more severe basal to mid inferior and inferoseptal hypokinesisand hyperdynamic apical function, incoordinate septal motion, temporary transvenous pacer noted in the IVC, mild RV systolic dysfunction, moderate LVH, MAC with trivial MR, dilated IVC  Patient Profile     68 y.o. male PMH of HL, HTN, IDDM, and OSA who presented with chest pain and CODE STEMI called in the field.Was also noted to be in symptomatic bradycardia requiring temporary pacemaker (TPM). Emergent cardiac cath revealed occluded RCA. PCI of the RCA complicated by ventricular tachycardia requiring defibrillation x3. DES PCI to RCA.  Assessment & Plan    1. Inferior STEMI:s/p PCI/DES x1 to the [facility] 04/08/2018. Completed 18 hours of Aggrastat due to distal embolization;  - On DAPT - ASA/Brilinta. -->Because of A. fib requiring DOAC, converted to Plavix.  Triple therapy with plans to stop ASA at discharge.  - euvolemic with reduced EF. Consider adding back ACE/ARB prior to discharge.   2. Bradycardia -resolved:Temp wire placed in the lab, removed on 3/5.  - BB held yesterday as he was bradycardiac.   3. VT: shocked 3x in the Cath Lab March 3 during cath/PCI -->placed on amiodarone and lidocaine drip. - Amiodarone at 443m BID currently. Plan to reduce at  discharge.  4. HLP:[id number] statin because of elevated LFTs. Now on statin with LFTs improving. - follow LFTs  5. IDDM:followed by endocrinology as outpatient. Hemoglobin A1c 6.7 in November - Lantus plus SSI, CBGs stable.  6. Elevated LFTs:suspect shock liver 2/2 to MI with RV infarct and VT.  - Continue to trend down while on statin.  7. Right groin hematomaafter sheath removal with the requirement of FemoStop on 3/5 . Hgb stable. Developed swelling yesterday evening and now with with sizable hematoma at site.  - doppler pending (I have called to have this done).  Got a dose of Eliquis this morning. Will hold evening dose until doppler completed. . 8. New onset Afib - rate controlled: successful cardioversion yesterday and remains in SR this morning.  - Continue amiodarone 400 mg twice daily, will decrease to 200 mg po daily on discharge. - Eliquis held until groin doppler completed.  For questions or updates, please contact [place] Please consult [email or web address] for contact info under    Signed, Reino Bellis, NP  04/14/2018, 10:52 AM    Agree with note by Reino Bellis NP-C  Status post successful cardioversion to sinus rhythm yesterday by Dr. Oval Linsey.  He is on triple therapy.  He developed a right groin hematoma which is fairly extensive but there is no bruit.  He is walked without difficulty with cardiac rehab.  He denies chest pain or shortness of breath.  He scheduled for duplex ultrasound today.  If there is no pseudoaneurysm he can be discharged home with close outpatient follow-up (TOC 7) followed by Dr. Angelena Form.  If he has a pseudoaneurysm will need to hold the Eliquis and decide best approach to address this.  Lorretta Harp, M.D., [place], [facility], [place] [address] [place], [place]  [address]  [phone] 04/14/2018 11:11 AM

## 2018-04-14 NOTE — Discharge Summary (Addendum)
Discharge Summary    Patient ID: Xavier Caldwell,  MRN: [id number], DOB/AGE: Apr 25, 1950 68 y.o.  Admit date: 04/07/2018 Discharge date: 04/14/2018  Primary Care Provider: [employer] Primary Cardiologist: Dr. Angelena Form (brought to [facility] for TOC given no Appts at [facility])  Discharge Diagnoses    Principal Problem:   Acute ST elevation myocardial infarction (STEMI) of inferior wall ([place]) Active Problems:   Uncontrolled type 2 diabetes mellitus with complication, with long-term current use of insulin ([place])   Hyperlipidemia associated with type 2 diabetes mellitus ([place])   Essential hypertension, benign   Symptomatic bradycardia - - ischemic, s/p TPM   Coronary artery disease involving native coronary artery of native heart with unstable angina pectoris (HCC)   Presence of drug coated stent in right coronary artery   Ventricular tachycardia (HCC)   Hematoma of groin   Status post coronary artery stent placement   Paroxysmal atrial fibrillation (HCC)   Allergies No Known Allergies  Diagnostic Studies/Procedures    Cath: 04/07/2018   Prox RCA lesion is 100% stenosed.  Ost LAD to Prox LAD lesion is 30% stenosed.  Mid LAD lesion is 40% stenosed.  Ost 2nd Mrg to 2nd Mrg lesion is 40% stenosed.  A drug-eluting stent was successfully placed using a STENT RESOLUTE ONYX 3.5X30.  Post intervention, there is a 0% residual stenosis.   1. 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 Ventricular fibrillation/cardiac arrest with successful resuscitation 3. Successful PTCA/DES x 1 proximal RCA.  4. Successful aspiration thrombectomy distal RCA 5. Mild to moderate non-obstructive disease in the Circumflex and LAD  Recommendations: Will admit to ICU. Echo in the am. Will continue Aggrastat for 18 hours. Will continue ASA/Brilinta and statin. Will not start a beta blocker today given hypotension and bradycardia. Ace inh on hold with hypotension.     TTE: 04/08/2018  IMPRESSIONS    1. The left ventricle has mildly reduced systolic function, with an ejection fraction of 45-50%. The cavity size was normal. There is moderately increased left ventricular wall thickness. 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 Left ventricular diffuse hypokinesis.  2. The right ventricle has mildly reduced systolic function. The cavity was mildly enlarged. There is no increase in right ventricular wall thickness.  3. The mitral valve is degenerative. There is mild mitral annular calcification present.  4. The aortic valve was not well visualized.  5. The aortic root and ascending aorta are normal in size and structure.  6. The inferior vena cava was dilated in size with <50% respiratory variability.  7. Severe hypokinesis of the left ventricular basal to mid inferior and inferoseptal.  8. The interatrial septum was not well visualized. _____________   History of Present Illness     Xavier Caldwell is a 68 yo male with PMH of HL, HTN, IDDM, and OSA. He is followed by his PCP and endocrinology for his DM. He is on metformin 1000mg  BID as well as insulin. Hgb A1c last noted at 6.6. Also on Zocor with last LDL of 32 (12/18).   Reports developing onset of weakness and nausea while at [facility] this morning with is wife. Wife thought this may have been related to his blood sugar. Took him home and gave him a candy bar which did not help. Denied any chest pain. On EMS arrival his HR was noted in the 30. EKG with inferior elevation and HR of  31. Given ASA an zofran with EMS prior to arrival. Code STEMI called in the field. He was brought directly to the cath lab for emergent cardiac cath.    Hospital Course     1. Inferior STEMI:s/p PCI/DES x1 to the [facility] 04/08/2018.Completed 18 hours of Aggrastat due to distal embolization;  - On DAPT initially with ASA/Brilinta. -->Because of A.  fib requiring DOAC, converted to Plavix.Triple therapy with plans to stop ASA after one month. - euvolemic with reduced EF. Lisinopril restarted at 20mg  instead of 40mg  daily.   2. Bradycardia -resolved:Tempwire placed in the lab,removed on 3/5.  - BB initially started but then bradycardiac while in Afib prior to DCCV. Would consider adding back as an outpatient at follow up appt.  3. VT: shocked 3x in the Cath Lab on March 3 during cath/PCI -->placed on amiodarone and lidocaine drip. Ectopy improved and then stopped. - Amiodarone at 400mg  BID while inpatient. Reduced to 200mg  daily at discharge.   4. LF:YBOFBPZWC heldstatin because of elevated LFTs. Restarted Atorva 80mg  daily.  -- LDL 76  5. IDDM:followed by endocrinology as outpatient. Hemoglobin A1c 6.7 in November -- resume home regimen of metformin and Glargine qhs  6. Elevated LFTs:suspect shock liver 2/2 to MI with RV infarct and VT.AST-701-->23 ALT-554-->137 -Recheck as outpatient  7. Right groin hematomaafter sheath removalwith the requirement ofFemoStop on 3/5 . Hgb stable. Developed swelling 3/9 evening of DCCV with sizeable hematoma. Repeat groin doppler with NO PSA. Only hematoma. Discussed with Dr. Gwenlyn Found, will have patient resume Eliquis on 3/11.   -- recheck Hgb 3/13 as outpatient . 8. New onset Afib - rate controlled: successful cardioversion 3/9 and maintained SR.  - Amiodarone decreased to 200 mg po daily on discharge. - Eliquis 5mg  BID to start 3/11   [name] was seen by Dr. Gwenlyn Found and determined stable for discharge home. Follow up in the office has been arranged. Medications are listed below.   _____________  Discharge Vitals Blood pressure 135/65, pulse 63, temperature 98.2 F (36.8 C), temperature source Oral, resp. rate 18, height 5\' 11"  (1.803 m), weight 95.8 kg, SpO2 98 %.  Filed Weights   04/12/18 0615 04/13/18 0547 04/14/18 0606  Weight: 98.7 kg 97.4 kg 95.8 kg    Labs &  Radiologic Studies    CBC Recent Labs    04/13/18 1826 04/14/18 0457  WBC 9.4 10.3  HGB 10.3* 10.1*  HCT 31.5* 30.1*  MCV 88.7 89.1  PLT 234 585   Basic Metabolic Panel Recent Labs    04/14/18 0457  NA 134*  K 4.5  CL 102  CO2 24  GLUCOSE 115*  BUN 15  CREATININE 1.01  CALCIUM 8.8*  MG 1.7   Liver Function Tests Recent Labs    04/14/18 0457  AST 23  ALT 137*  ALKPHOS 58  BILITOT 0.9  PROT 5.7*  ALBUMIN 2.9*   No results for input(s): LIPASE, AMYLASE in the last 72 hours. Cardiac Enzymes No results for input(s): CKTOTAL, CKMB, CKMBINDEX, TROPONINI in the last 72 hours. BNP Invalid input(s): POCBNP D-Dimer No results for input(s): DDIMER in the last 72 hours. Hemoglobin A1C No results for input(s): HGBA1C in the last 72 hours. Fasting Lipid Panel No results for input(s): CHOL, HDL, LDLCALC, TRIG, CHOLHDL, LDLDIRECT in the last 72 hours. Thyroid Function Tests No results for input(s): TSH, T4TOTAL, T3FREE, THYROIDAB in the last 72 hours.  Invalid input(s): FREET3 _____________  Dg Chest 1 View  Result Date: 04/07/2018 CLINICAL  DATA:  Transvenous pacer placement EXAM: CHEST  1 VIEW COMPARISON:  11/18/2016 FINDINGS: Mild cardiomegaly. Ascending pacer lead with tip projecting over the right ventricle. Lung fields are clear. No pneumothorax. IMPRESSION: Ascending pacer lead with tip projecting over the right ventricle. Mild cardiomegaly. Electronically Signed   By: Donavan Foil M.D.   On: 04/07/2018 22:09   Vas [place] Lower Extremity Arterial Duplex  Result Date: 04/14/2018 LOWER EXTREMITY ARTERIAL DUPLEX STUDY Indications: Follow up hematoma seen 04/11/2018 post cadiac catheterization, now              with new area of palpable "knot" at lateral groin/hip/pelvic area.  Current ABI: Not obtained Performing Technologist: Maudry Mayhew MHA, RDMS, RVT, RDCS  Examination Guidelines: A complete evaluation includes B-mode imaging, spectral Doppler, color Doppler, and  power Doppler as needed of all accessible portions of each vessel. Bilateral testing is considered an integral part of a complete examination. Limited examinations for reoccurring indications may be performed as noted.  Summary: Right: There is no obvious evidence of active right common femoral artery pseudoaneurysm. There are two heterogenous areas with no internal vascularity, one at the right hip/pelvic region measuring 2.9 x 2.5 x 4cm, and the other more laterally measuring 5 x 1.8 x 6.5cm. These are suggestive of hematoma. There are multiple branches with monophasic flow seen in the lateral area surrounding the area of possible hematoma; etiology is unknown. The common femoral artery is patent with triphasic flow. The right common femoral vein is patent without evidence of thrombosis.  See table(s) above for measurements and observations.    Preliminary    Vas [place] Lower Extremity Arterial Duplex  Result Date: 04/11/2018 LOWER EXTREMITY ARTERIAL DUPLEX STUDY Indications: Groin discoloration and tenderness post cardiac catheterization.  Current ABI: Not obtained Performing Technologist: Maudry Mayhew MHA, RDMS, RVT, RDCS  Examination Guidelines: A complete evaluation includes B-mode imaging, spectral Doppler, color Doppler, and power Doppler as needed of all accessible portions of each vessel. Bilateral testing is considered an integral part of a complete examination. Limited examinations for reoccurring indications may be performed as noted.  Summary: Right: No evidence of pseudoaneurysm involving the right common femoral artery. There is a heterogenous area that extends from lateral hip across groin area to the pubic area that is suggestive of a hematoma. The common femoral artery is patent with multiphasic flow. The common femoral vein is paten with no evidence of DVT.  See table(s) above for measurements and observations. Electronically signed by Monica Martinez MD on 04/11/2018 at 4:28:22 PM.    Final     Disposition   Pt is being discharged home today in good condition.  Follow-up Plans & Appointments    Follow-up Information    Barrett, Evelene Croon, PA-C Follow up on 04/21/2018.   Specialties:  Cardiology, Radiology Why:  at 11am for your follow up appt.  Contact information: [address] [place] [address] [phone]        [facility] Follow up on 04/17/2018.   Specialty:  Cardiology Why:  please come in for follow up labs Contact information: [place] [phone]       [employer], MD Follow up on 04/14/2018.   Specialty:  Internal Medicine Why:  within 3-4 weeks Contact information: [address] [place] [address] [phone]          Discharge Instructions    Amb Referral to Cardiac Rehabilitation   Complete by:  As  directed    Diagnosis:   STEMI Coronary Stents     Call MD for:  redness, tenderness, or signs of infection (pain, swelling, redness, odor or green/yellow discharge around incision site)   Complete by:  As directed    Diet - low sodium heart healthy   Complete by:  As directed    Discharge instructions   Complete by:  As directed    Groin Site Care Refer to this sheet in the next few weeks. These instructions provide you with information on caring for yourself after your procedure. Your caregiver may also give you more specific instructions. Your treatment has been planned according to current medical practices, but problems sometimes occur. Call your caregiver if you have any problems or questions after your procedure. HOME CARE INSTRUCTIONS You may shower 24 hours after the procedure. Remove the bandage (dressing) and gently wash the site with plain soap and water. Gently pat the site dry.  Do not apply powder or lotion to the site.  Do not sit in a bathtub, swimming pool, or whirlpool for 5 to 7 days.  No bending, squatting, or lifting  anything over 10 pounds (4.5 kg) until seen at follow up appt.  Inspect the site at least twice daily.  No driving until seen back at follow up appt.  What to expect: Any bruising will usually fade within 1 to 2 weeks.  Blood that collects in the tissue (hematoma) may be painful to the touch. It should usually decrease in size and tenderness within 1 to 2 weeks.  SEEK IMMEDIATE MEDICAL CARE IF: You have unusual pain at the groin site or down the affected leg.  You have redness, warmth, swelling, or pain at the groin site.  You have drainage (other than a small amount of blood on the dressing).  You have chills.  You have a fever or persistent symptoms for more than 72 hours.  You have a fever and your symptoms suddenly get worse.  Your leg becomes pale, cool, tingly, or numb.  You have heavy bleeding from the site. Hold pressure on the site.   You will be on triple therapy with aspirin, plavix and eliquis. Please monitor your cath site closely with these medications given your hematoma at the site. No bending, squatting until seen back in follow up. Please come in for lab check on 3/13. You will need for monitor for dark stools while on these medications as well. Please call our office with any concerns.   Increase activity slowly   Complete by:  As directed        Discharge Medications     Medication List    STOP taking these medications   simvastatin 20 MG tablet Commonly known as:  ZOCOR     TAKE these medications   ALPRAZolam 0.5 MG tablet Commonly known as:  XANAX Take 0.5 mg by mouth 2 (two) times daily as needed for anxiety.   amiodarone 200 MG tablet Commonly known as:  PACERONE Take 1 tablet (200 mg total) by mouth daily.   apixaban 5 MG Tabs tablet Commonly known as:  Eliquis Take 1 tablet (5 mg total) by mouth 2 (two) times daily.   aspirin EC 81 MG tablet Take 81 mg by mouth every evening. What changed:  Another medication with the same name was removed.  Continue taking this medication, and follow the directions you see here.   atorvastatin 40 MG tablet Commonly known as:  LIPITOR Take 1 tablet (40  mg total) by mouth daily at 6 PM.   Basaglar KwikPen 100 UNIT/ML Sopn INJECT 50 UNITS SUBCUTANEOUSLY AT BEDTIME What changed:  See the new instructions.   clopidogrel 75 MG tablet Commonly known as:  PLAVIX Take 1 tablet (75 mg total) by mouth daily. Start taking on:  April 15, 2018   DULoxetine 60 MG capsule Commonly known as:  CYMBALTA Take 60 mg by mouth daily with lunch.   ergocalciferol 1.25 MG (50000 UT) capsule Commonly known as:  VITAMIN D2 Take 50,000 Units by mouth every Tuesday.   glucose blood test strip 1 each by Other route 2 (two) times daily. Use as instructed 2 x daily, One touch Ultra   HYDROcodone-acetaminophen 5-325 MG tablet Commonly known as:  NORCO/VICODIN Take 1 tablet by mouth every 6 (six) hours as needed for moderate pain.   lisinopril 20 MG tablet Commonly known as:  PRINIVIL,ZESTRIL Take 1 tablet (20 mg total) by mouth daily. Start taking on:  April 15, 2018 What changed:    medication strength  how much to take   metFORMIN 1000 MG tablet Commonly known as:  GLUCOPHAGE Take 1 tablet (1,000 mg total) by mouth 2 (two) times daily.   nitroGLYCERIN 0.4 MG SL tablet Commonly known as:  Nitrostat Place 1 tablet (0.4 mg total) under the tongue every 5 (five) minutes as needed.   pregabalin 150 MG capsule Commonly known as:  LYRICA Take 150 mg by mouth at bedtime.   rOPINIRole 1 MG tablet Commonly known as:  REQUIP Take 1 mg by mouth 3 (three) times daily.   SUPER B COMPLEX PO Take 1 tablet by mouth daily.   timolol 0.5 % ophthalmic solution Commonly known as:  TIMOPTIC Place 1 drop into both eyes 2 (two) times daily.   Unifine Pentips 31G X 5 MM Misc Generic drug:  Insulin Pen Needle USE TO INJECT INSULIN AT BEDTIME What changed:  See the new instructions.        Acute coronary  syndrome (MI, NSTEMI, STEMI, etc) this admission?: Yes.     AHA/ACC Clinical Performance & Quality Measures: 1. Aspirin prescribed? - Yes 2. ADP Receptor Inhibitor (Plavix/Clopidogrel, Brilinta/Ticagrelor or Effient/Prasugrel) prescribed (includes medically managed patients)? - Yes 3. Beta Blocker prescribed? - No - bradycardia 4. High Intensity Statin (Lipitor 40-80mg  or Crestor 20-40mg ) prescribed? - Yes 5. EF assessed during THIS hospitalization? - Yes 6. For EF <40%, was ACEI/ARB prescribed? - Yes 7. For EF <40%, Aldosterone Antagonist (Spironolactone or Eplerenone) prescribed? - Not Applicable (EF >/= 78%) 8. Cardiac Rehab Phase II ordered (Included Medically managed Patients)? - Yes      Outstanding Labs/Studies   FLP/LFTs, CBC on 3/13.   Duration of Discharge Encounter   Greater than 30 minutes including physician time.  Signed, Reino Bellis NP-C 04/14/2018, 2:37 PM  Agree with note by Reino Bellis NP-C  Mr. Jeanell Sparrow was stable for discharge today.  He is status post inferior STEMI treated with PCI and drug-eluting stenting of the RCA complicated by heart block, temporary pacemaker, ventricular tachycardia.  He had noncritical disease otherwise with preserved LV function.  He did go into A. fib and underwent successful DC cardioversion yesterday by Dr. Oval Linsey.  Unfortunately, he did develop a hematoma in his right groin on Eliquis, aspirin and Plavix.  Duplex ultrasound confirmed that there was no pseudoaneurysm.  The hematoma was not painful and is hemoglobin was stable.  He was discharged home on triple therapy with the intent to stop the aspirin after  1 month.  Lorretta Harp, M.D., [place], [facility], Laverta [place] [address] [place], [place]  [address]  [phone] 04/14/2018 4:33 PM

## 2018-04-14 NOTE — Progress Notes (Signed)
Physical Therapy Treatment Patient Details Name: Xavier Caldwell MRN: [id number] DOB: 1950-10-13 Today's Date: 04/14/2018    History of Present Illness Pt is a 68 y/o male admitted 04/07/18 with nausea and weakness. Code STEMI with emergent cardiac cath on 04/07/2018. S/p DVVC 3/9; post-procedure R groin hematoma post-cath. PMH significant of HL, HTN, IDDM, OSA.    PT Comments    Pt progressing well with mobility. Mod indep with transfers and ambulation using rollator; educ on fall risk reduction and energy conservation. Pt will have 24/7 support from wife and son upon return home. Pt has no further questions or concerns. Has met short-term PT goals. Will d/c acute PT.    Follow Up Recommendations  Outpatient PT;Supervision for mobility/OOB     Equipment Recommendations  None recommended by PT    Recommendations for Other Services       Precautions / Restrictions Precautions Precautions: Fall Restrictions Weight Bearing Restrictions: No    Mobility  Bed Mobility Overal bed mobility: Independent                Transfers Overall transfer level: Independent Equipment used: None Transfers: Sit to/from Stand              Ambulation/Gait Ambulation/Gait assistance: Modified independent (Device/Increase time) Gait Distance (Feet): 600 Feet Assistive device: 4-wheeled walker Gait Pattern/deviations: Step-through pattern;Decreased stride length;Trunk flexed Gait velocity: Decreased Gait velocity interpretation: <1.8 ft/sec, indicate of risk for recurrent falls General Gait Details: Steps in room without DME; mod indep hallway ambulation with rollator   Stairs             Wheelchair Mobility    Modified Rankin (Stroke Patients Only)       Balance Overall balance assessment: Needs assistance   Sitting balance-Leahy Scale: Good       Standing balance-Leahy Scale: Fair                              Cognition Arousal/Alertness:  Awake/alert Behavior During Therapy: WFL for tasks assessed/performed Overall Cognitive Status: Within Functional Limits for tasks assessed                                        Exercises      General Comments General comments (skin integrity, edema, etc.): Wife present and supportive; encouraged ambulation with wife      Pertinent Vitals/Pain Pain Assessment: No/denies pain    Home Living                      Prior Function            PT Goals (current goals can now be found in the care plan section) Progress towards PT goals: Goals met/education completed, patient discharged from PT    Frequency    Min 3X/week      PT Plan Current plan remains appropriate    Co-evaluation              AM-PAC PT "6 Clicks" Mobility   Outcome Measure  Help needed turning from your back to your side while in a flat bed without using bedrails?: None Help needed moving from lying on your back to sitting on the side of a flat bed without using bedrails?: None Help needed moving to and from a bed to a chair (including a wheelchair)?:  None Help needed standing up from a chair using your arms (e.g., wheelchair or bedside chair)?: None Help needed to walk in hospital room?: None Help needed climbing 3-5 steps with a railing? : A Little 6 Click Score: 23    End of Session Equipment Utilized During Treatment: Gait belt Activity Tolerance: Patient tolerated treatment well Patient left: in bed;with call bell/phone within reach;with family/visitor present Nurse Communication: Mobility status PT Visit Diagnosis: Unsteadiness on feet (R26.81);Other abnormalities of gait and mobility (R26.89);Muscle weakness (generalized) (M62.81);History of falling (Z91.81)     Time: 8614-8307 PT Time Calculation (min) (ACUTE ONLY): 13 min  Charges:  $Gait Training: 8-22 mins                    Mabeline Caras, PT, DPT Acute Rehabilitation Services  Pager  [phone] Office [place] 04/14/2018, 10:00 AM

## 2018-04-15 ENCOUNTER — Encounter (HOSPITAL_COMMUNITY): Payer: Self-pay | Admitting: Cardiovascular Disease

## 2018-04-17 ENCOUNTER — Other Ambulatory Visit: Payer: Self-pay | Admitting: "Endocrinology

## 2018-04-17 DIAGNOSIS — Z794 Long term (current) use of insulin: Secondary | ICD-10-CM | POA: Diagnosis not present

## 2018-04-17 DIAGNOSIS — E1165 Type 2 diabetes mellitus with hyperglycemia: Secondary | ICD-10-CM | POA: Diagnosis not present

## 2018-04-17 DIAGNOSIS — E782 Mixed hyperlipidemia: Secondary | ICD-10-CM | POA: Diagnosis not present

## 2018-04-17 DIAGNOSIS — E118 Type 2 diabetes mellitus with unspecified complications: Secondary | ICD-10-CM | POA: Diagnosis not present

## 2018-04-18 LAB — COMPREHENSIVE METABOLIC PANEL
ALBUMIN: 3.7 g/dL — AB (ref 3.8–4.8)
ALT: 66 IU/L — ABNORMAL HIGH (ref 0–44)
AST: 17 IU/L (ref 0–40)
Albumin/Globulin Ratio: 1.6 (ref 1.2–2.2)
Alkaline Phosphatase: 74 IU/L (ref 39–117)
BUN / CREAT RATIO: 13 (ref 10–24)
BUN: 12 mg/dL (ref 8–27)
Bilirubin Total: 0.7 mg/dL (ref 0.0–1.2)
CO2: 20 mmol/L (ref 20–29)
Calcium: 8.8 mg/dL (ref 8.6–10.2)
Chloride: 99 mmol/L (ref 96–106)
Creatinine, Ser: 0.9 mg/dL (ref 0.76–1.27)
GFR calc Af Amer: 103 mL/min/{1.73_m2} (ref >59)
GFR calc non Af Amer: 89 mL/min/{1.73_m2} (ref >59)
Globulin, Total: 2.3 g/dL (ref 1.5–4.5)
Glucose: 131 mg/dL — ABNORMAL HIGH (ref 65–99)
Potassium: 5 mmol/L (ref 3.5–5.2)
SODIUM: 134 mmol/L (ref 134–144)
Total Protein: 6 g/dL (ref 6.0–8.5)

## 2018-04-18 LAB — TSH: TSH: 0.668 u[IU]/mL (ref 0.450–4.500)

## 2018-04-18 LAB — MICROALBUMIN, URINE: MICROALBUM., U, RANDOM: 5.8 ug/mL

## 2018-04-18 LAB — LIPID PANEL W/O CHOL/HDL RATIO
Cholesterol, Total: 107 mg/dL (ref 100–199)
HDL: 32 mg/dL — ABNORMAL LOW (ref >39)
LDL Calculated: 57 mg/dL (ref 0–99)
Triglycerides: 92 mg/dL (ref 0–149)
VLDL Cholesterol Cal: 18 mg/dL (ref 5–40)

## 2018-04-18 LAB — HGB A1C W/O EAG: Hgb A1c MFr Bld: 6.9 % — ABNORMAL HIGH (ref 4.8–5.6)

## 2018-04-18 LAB — T4: T4, Total: 9.2 ug/dL (ref 4.5–12.0)

## 2018-04-18 LAB — VITAMIN D 25 HYDROXY (VIT D DEFICIENCY, FRACTURES): VIT D 25 HYDROXY: 74.2 ng/mL (ref 30.0–100.0)

## 2018-04-20 DIAGNOSIS — E119 Type 2 diabetes mellitus without complications: Secondary | ICD-10-CM | POA: Diagnosis not present

## 2018-04-20 DIAGNOSIS — I219 Acute myocardial infarction, unspecified: Secondary | ICD-10-CM | POA: Diagnosis not present

## 2018-04-20 DIAGNOSIS — I10 Essential (primary) hypertension: Secondary | ICD-10-CM | POA: Diagnosis not present

## 2018-04-20 DIAGNOSIS — E669 Obesity, unspecified: Secondary | ICD-10-CM | POA: Diagnosis not present

## 2018-04-20 DIAGNOSIS — I4891 Unspecified atrial fibrillation: Secondary | ICD-10-CM | POA: Diagnosis not present

## 2018-04-20 DIAGNOSIS — Z6830 Body mass index (BMI) 30.0-30.9, adult: Secondary | ICD-10-CM | POA: Diagnosis not present

## 2018-04-21 ENCOUNTER — Ambulatory Visit: Payer: Medicare HMO | Admitting: Physician Assistant

## 2018-04-21 ENCOUNTER — Other Ambulatory Visit: Payer: Self-pay

## 2018-04-21 ENCOUNTER — Encounter: Payer: Self-pay | Admitting: Physician Assistant

## 2018-04-21 ENCOUNTER — Other Ambulatory Visit: Payer: Self-pay | Admitting: Physician Assistant

## 2018-04-21 VITALS — BP 136/72 | HR 72 | Ht 71.0 in | Wt 216.2 lb

## 2018-04-21 DIAGNOSIS — I480 Paroxysmal atrial fibrillation: Secondary | ICD-10-CM | POA: Diagnosis not present

## 2018-04-21 DIAGNOSIS — I213 ST elevation (STEMI) myocardial infarction of unspecified site: Secondary | ICD-10-CM

## 2018-04-21 DIAGNOSIS — R5381 Other malaise: Secondary | ICD-10-CM | POA: Diagnosis not present

## 2018-04-21 DIAGNOSIS — R001 Bradycardia, unspecified: Secondary | ICD-10-CM | POA: Diagnosis not present

## 2018-04-21 DIAGNOSIS — I10 Essential (primary) hypertension: Secondary | ICD-10-CM

## 2018-04-21 DIAGNOSIS — R2689 Other abnormalities of gait and mobility: Secondary | ICD-10-CM | POA: Diagnosis not present

## 2018-04-21 MED ORDER — CARVEDILOL 3.125 MG PO TABS: 3.1250 mg | ORAL_TABLET | Freq: Two times a day (BID) | ORAL | 11 refills | Status: DC

## 2018-04-21 NOTE — Patient Instructions (Signed)
Medication Instructions:  Your physician recommends that you continue on your current medications as directed. Please refer to the Current Medication list given to you today.  If you need a refill on your cardiac medications before your next appointment, please call your pharmacy.    Follow-Up: At [facility], you and your health needs are our priority.  As part of our continuing mission to provide you with exceptional heart care, we have created designated Provider Care Teams.  These Care Teams include your primary Cardiologist (physician) and Advanced Practice Providers (APPs -  Physician Assistants and Nurse Practitioners) who all work together to provide you with the care you need, when you need it. You will need a follow up appointment in 3 months.  Please call our office 2 months in advance to schedule this appointment.  You may see Dr. Carlyle Dolly or one of the following Advanced Practice Providers on your designated Care Team:   Bernerd Pho, PA-C [facility]) . Ermalinda Barrios, PA-C ([place])  Any Other Special Instructions Will Be Listed Below (If Applicable). Cardiac Rehab at [facility] will call you to set up appointments.  You have been referred to Physical Therapy. They will also call you to schedule.

## 2018-04-21 NOTE — Progress Notes (Signed)
Cardiology Office Note   Date:  04/21/2018   ID:  Xavier Caldwell, DOB 09-10-1950, MRN [id number]  PCP:  Redmond School, MD Cardiologist:  Lauree Chandler, MD 04/07/2018 in-hospital Will f/u in [place] with Dr Gerilyn Nestle, PA-C   No chief complaint on file.   History of Present Illness: Xavier Caldwell is a 67 y.o. male with a history of IDDM, HTN, HLD, OSA  Admitted 03/03-03/11/2018 w/ STEMI s/p DES RCA (req temp PPM), VT, groin hematoma, PAF s/p DCCV on amio and Eliquis, abnl LFTs  Xavier Caldwell presents for cardiology follow up.  He is having problems w/ his legs, they get weak at times. He uses a walker when not at home. Has not fallen, but has balance issues.   No chest pain.  His MI symptoms were basically weakness and nausea.  He knew something was wrong.  He has not had the symptoms since discharge but understands that if he gets them he needs to seek help.  No LE edema, no orthopnea or PND.   No palpitations, no presyncope or syncope.  No probs w/ meds.   He was seen by PT while in the hospital and home PT was recommended but has not happened.  He has not been contacted by cardiac rehab.  He wishes to follow-up with cardiology and with rehab in [place].   Past Medical History:  Diagnosis Date  . Acute systolic (congestive) heart failure ([place])   . Anxiety   . Arthritis   . Atrial fibrillation ([place])   . Back pain   . Coronary artery disease   . Diabetes mellitus without complication ([place])   . High cholesterol   . History of gout   . Hypertension   . Myocardial infarction ([place]) 04/2018  . Neuropathy   . 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 Sleep apnea    Stop Bang score of 4  . STEMI (ST elevation myocardial infarction) ([place])    04/08/2018 PCI/DES RCA  . VT (ventricular tachycardia) ([place])     Past Surgical History:  Procedure Laterality Date  . APPENDECTOMY    . CARDIOVERSION N/A 04/13/2018   Procedure:  CARDIOVERSION;  Surgeon: Skeet Latch, MD;  Location: [facility];  Service: Cardiovascular;  Laterality: N/A;  . CATARACT EXTRACTION W/PHACO Right 04/25/2014   Procedure: CATARACT EXTRACTION PHACO AND INTRAOCULAR LENS PLACEMENT ([place]);  Surgeon: Williams Che, MD;  Location: [facility];  Service: Ophthalmology;  Laterality: Right;  CDE:4.70  . CATARACT EXTRACTION W/PHACO Left 07/11/2014   Procedure: CATARACT EXTRACTION PHACO AND INTRAOCULAR LENS PLACEMENT LEFT EYE CDE=8.32;  Surgeon: Williams Che, MD;  Location: [facility];  Service: Ophthalmology;  Laterality: Left;  . COLONOSCOPY WITH PROPOFOL N/A 09/23/2016   Procedure: COLONOSCOPY WITH PROPOFOL;  Surgeon: Daneil Dolin, MD;  [facility];  Service: Endoscopy;  Laterality: N/A;  . CORONARY/GRAFT ACUTE MI REVASCULARIZATION N/A 04/07/2018   Procedure: Coronary/Graft Acute MI Revascularization;  Surgeon: Burnell Blanks, MD;  Location: [facility];  Service: Cardiovascular;  Laterality: N/A;  . LEFT HEART CATH AND CORONARY ANGIOGRAPHY N/A 04/07/2018   Procedure: LEFT HEART CATH AND CORONARY ANGIOGRAPHY;  Surgeon: Burnell Blanks, MD;  Location: [facility];  Service: Cardiovascular;  Laterality: N/A;  . POLYPECTOMY  09/23/2016   Procedure: POLYPECTOMY;  Surgeon: Daneil Dolin, MD;  [facility];  Service: Endoscopy;;  colon  . RETINAL  DETACHMENT SURGERY      Current Outpatient Medications  Medication Sig Dispense Refill  . ALPRAZolam (XANAX) 0.5 MG tablet Take 0.5 mg by mouth 2 (two) times daily as needed for anxiety.    Marland Kitchen amiodarone (PACERONE) 200 MG tablet Take 1 tablet (200 mg total) by mouth daily. 30 tablet 1  . apixaban (ELIQUIS) 5 MG TABS tablet Take 1 tablet (5 mg total) by mouth 2 (two) times daily. 60 tablet 1  . aspirin EC 81 MG tablet Take 81 mg by mouth every evening.     Marland Kitchen atorvastatin (LIPITOR) 40 MG tablet Take 1 tablet (40 mg total) by mouth daily at 6 PM. 30 tablet 1  . B  Complex-C (SUPER B COMPLEX PO) Take 1 tablet by mouth daily.     . clopidogrel (PLAVIX) 75 MG tablet Take 1 tablet (75 mg total) by mouth daily. 90 tablet 2  . DULoxetine (CYMBALTA) 60 MG capsule Take 60 mg by mouth daily with lunch.     . ergocalciferol (VITAMIN D2) 50000 units capsule Take 50,000 Units by mouth every Tuesday.     Marland Kitchen glucose blood test strip 1 each by Other route 2 (two) times daily. Use as instructed 2 x daily, One touch Ultra 100 each 5  . HYDROcodone-acetaminophen (NORCO/VICODIN) 5-325 MG per tablet Take 1 tablet by mouth every 6 (six) hours as needed for moderate pain.    . Insulin Glargine (BASAGLAR KWIKPEN) 100 UNIT/ML SOPN INJECT 50 UNITS SUBCUTANEOUSLY AT BEDTIME (Patient taking differently: Inject 35 Units into the skin every evening. ) 15 mL 2  . lisinopril (PRINIVIL,ZESTRIL) 20 MG tablet Take 1 tablet (20 mg total) by mouth daily. 30 tablet 1  . metFORMIN (GLUCOPHAGE) 1000 MG tablet Take 1 tablet (1,000 mg total) by mouth 2 (two) times daily. 180 tablet 1  . nitroGLYCERIN (NITROSTAT) 0.4 MG SL tablet Place 1 tablet (0.4 mg total) under the tongue every 5 (five) minutes as needed. 25 tablet 2  . pregabalin (LYRICA) 150 MG capsule Take 150 mg by mouth at bedtime.    Marland Kitchen rOPINIRole (REQUIP) 1 MG tablet Take 1 mg by mouth 3 (three) times daily.    . timolol (TIMOPTIC) 0.5 % ophthalmic solution Place 1 drop into both eyes 2 (two) times daily.     Marland Kitchen UNIFINE PENTIPS 31G X 5 MM MISC USE TO INJECT INSULIN AT BEDTIME (Patient taking differently: at bedtime. ) 100 each 5   No current facility-administered medications for this visit.     Allergies:   Patient has no known allergies.    Social History:  The patient  reports that he quit smoking about 5 years ago. His smoking use included cigarettes. He has a 45.00 pack-year smoking history. He has never used smokeless tobacco. He reports that he does not drink alcohol or use drugs.   Family History:  The patient's family history  includes Hypertension in his father.  He indicated that the status of his father is unknown. He indicated that the status of his neg hx is unknown.   ROS:  Please see the history of present illness. All other systems are reviewed and negative.    PHYSICAL EXAM: VS:  BP 136/72   Pulse 72   Ht 5\' 11"  (1.803 m)   Wt 216 lb 3.2 oz (98.1 kg)   BMI 30.15 kg/m  , BMI Body mass index is 30.15 kg/m. GEN: Well nourished, well developed, male in no acute distress HEENT: normal for age  Neck:  no JVD, no carotid bruit, no masses Cardiac: RRR; no murmur, no rubs, or gallops Respiratory:  clear to auscultation bilaterally, normal work of breathing GI: soft, nontender, nondistended, + BS MS: no deformity or atrophy; no edema; distal pulses are 2+ in all 4 extremities  Skin: warm and dry, no rash Neuro:  Strength and sensation are intact Psych: euthymic mood, full affect   EKG:  EKG is ordered today. The ekg ordered today demonstrates sinus rhythm, heart rate 72, inferior Q waves are new but are appropriate for post MI ECG  Cath: 04/07/2018   Prox RCA lesion is 100% stenosed.  Ost LAD to Prox LAD lesion is 30% stenosed.  Mid LAD lesion is 40% stenosed.  Ost 2nd Mrg to 2nd Mrg lesion is 40% stenosed.  A drug-eluting stent was successfully placed using a STENT RESOLUTE ONYX 3.5X30.  Post intervention, there is a 0% residual stenosis.  1. 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 Ventricular fibrillation/cardiac arrest with successful resuscitation 3. Successful PTCA/DES x 1 proximal RCA.  4. Successful aspiration thrombectomy distal RCA 5. Mild to moderate non-obstructive disease in the Circumflex and LAD  Recommendations: Will admit to ICU. Echo in the am. Will continue Aggrastat for 18 hours. Will continue ASA/Brilinta and statin. Will not start a beta blocker today given hypotension and bradycardia. Ace inh on hold with hypotension.   TTE:  04/08/2018 IMPRESSIONS 1. The left ventricle has mildly reduced systolic function, with an ejection fraction of 45-50%. The cavity size was normal. There is moderately increased left ventricular wall thickness. 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 Left ventricular diffuse hypokinesis. 2. The right ventricle has mildly reduced systolic function. The cavity was mildly enlarged. There is no increase in right ventricular wall thickness. 3. The mitral valve is degenerative. There is mild mitral annular calcification present. 4. The aortic valve was not well visualized. 5. The aortic root and ascending aorta are normal in size and structure. 6. The inferior vena cava was dilated in size with <50% respiratory variability. 7. Severe hypokinesis of the left ventricular basal to mid inferior and inferoseptal. 8. The interatrial septum was not well visualized   Recent Labs: 04/11/2018: TSH 0.547 04/14/2018: ALT 137; BUN 15; Creatinine, Ser 1.01; Hemoglobin 10.1; Magnesium 1.7; Platelets 234; Potassium 4.5; Sodium 134  CBC    Component Value Date/Time   WBC 10.3 04/14/2018 0457   RBC 3.38 (L) 04/14/2018 0457   HGB 10.1 (L) 04/14/2018 0457   HCT 30.1 (L) 04/14/2018 0457   PLT 234 04/14/2018 0457   MCV 89.1 04/14/2018 0457   MCH 29.9 04/14/2018 0457   MCHC 33.6 04/14/2018 0457   RDW 13.0 04/14/2018 0457   LYMPHSABS 2.6 04/07/2018 1349   MONOABS 1.0 04/07/2018 1349   EOSABS 0.0 04/07/2018 1349   BASOSABS 0.1 04/07/2018 1349   CMP Latest Ref Rng & Units 04/14/2018 04/11/2018 04/10/2018  Glucose 70 - 99 mg/dL 115(H) 102(H) 127(H)  BUN 8 - 23 mg/dL 15 15 21   Creatinine 0.61 - 1.24 mg/dL 1.01 0.82 1.05  Sodium 135 - 145 mmol/L 134(L) 136 134(L)  Potassium 3.5 - 5.1 mmol/L 4.5 3.9 4.0  Chloride 98 - 111 mmol/L 102 104 106  CO2 22 - 32 mmol/L 24 23 23   Calcium 8.9 - 10.3 mg/dL 8.8(L) 8.3(L) 8.2(L)  Total  Protein 6.5 - 8.1 g/dL 5.7(L) 5.6(L) 5.3(L)  Total Bilirubin 0.3 - 1.2 mg/dL  0.9 0.8 0.6  Alkaline Phos 38 - 126 U/L 58 60 58  AST 15 - 41 U/L 23 103(H) 278(H)  ALT 0 - 44 U/L 137(H) 369(H) 592(H)     Lipid Panel Lab Results  Component Value Date   CHOL 122 04/07/2018   HDL 28 (L) 04/07/2018   LDLCALC 76 04/07/2018   TRIG 90 04/07/2018   CHOLHDL 4.4 04/07/2018      Wt Readings from Last 3 Encounters:  04/21/18 216 lb 3.2 oz (98.1 kg)  04/14/18 211 lb 1.6 oz (95.8 kg)  12/23/17 219 lb (99.3 kg)     Other studies Reviewed: Additional studies/ records that were reviewed today include: Hospital records and testing.  ASSESSMENT AND PLAN:  1.  STEMI, subsequent episode of care: He is doing well from a cardiac standpoint - Instructed him to increase activity per cardiac rehab guidelines. - He is in the queue for cardiac rehab but has not been contacted yet - Continue aspirin, Plavix, Eliquis.  Stop aspirin after 1 month - Add Coreg 3.125 mg twice daily  2.  Bradycardia: -He had bradycardia requiring a temporary wire at the time of cath and had additional bradycardia while in [place] - Currently maintaining sinus rhythm with a normal heart rate, start low-dose Coreg  3.  VT: Because of the VT and the PAF, he was placed on amiodarone - Dosage was reduced to 200 mg daily at discharge - Discussed this with Dr. Gwenlyn Found, okay to stop amiodarone after a month  4.  PAF: -In the setting of acute MI - No palpitations or presyncope since discharge -Continue Eliquis and add beta-blocker  5.  Hypertension: -Blood pressure is minimally above target, believe he will tolerate low-dose beta-blocker  6.  Physical deconditioning and balance problems -Refer to outpatient physical therapy as recommended by physical therapy when seen in the hospital   Current medicines are reviewed at length with the patient today.  The patient does not have concerns regarding medicines.  The following  changes have been made: Add Coreg, stop amiodarone and aspirin in the month.  These issues were decided after the patient left, he was called and told about the changes  Labs/ tests ordered today include:   Orders Placed This Encounter  Procedures  . Ambulatory referral to Physical Therapy  . Ambulatory referral to Cardiology  . EKG 12-Lead     Disposition:   FU with Carlyle Dolly, MD in 3 months  Signed, Rosaria Ferries, PA-C  04/21/2018 2:08 PM    [place] Phone: [phone]; Fax: [phone]

## 2018-04-24 ENCOUNTER — Other Ambulatory Visit: Payer: Self-pay

## 2018-04-24 ENCOUNTER — Ambulatory Visit (INDEPENDENT_AMBULATORY_CARE_PROVIDER_SITE_OTHER): Payer: Medicare HMO | Admitting: "Endocrinology

## 2018-04-24 DIAGNOSIS — E119 Type 2 diabetes mellitus without complications: Secondary | ICD-10-CM | POA: Diagnosis not present

## 2018-04-24 DIAGNOSIS — E118 Type 2 diabetes mellitus with unspecified complications: Principal | ICD-10-CM

## 2018-04-24 DIAGNOSIS — Z794 Long term (current) use of insulin: Secondary | ICD-10-CM

## 2018-04-24 DIAGNOSIS — E7849 Other hyperlipidemia: Secondary | ICD-10-CM | POA: Diagnosis not present

## 2018-04-24 DIAGNOSIS — E1165 Type 2 diabetes mellitus with hyperglycemia: Secondary | ICD-10-CM | POA: Diagnosis not present

## 2018-04-24 DIAGNOSIS — E782 Mixed hyperlipidemia: Secondary | ICD-10-CM | POA: Diagnosis not present

## 2018-04-24 DIAGNOSIS — IMO0002 Reserved for concepts with insufficient information to code with codable children: Secondary | ICD-10-CM

## 2018-04-24 NOTE — Progress Notes (Signed)
Endocrinology follow-up note- via telephone during COVID-19 pandemic   Subjective:    Patient ID: Xavier Caldwell, male    DOB: 04-10-1950,    Past Medical History:  Diagnosis Date  . Acute systolic (congestive) heart failure ([place])   . Anxiety   . Arthritis   . Atrial fibrillation ([place])   . Back pain   . Coronary artery disease   . Diabetes mellitus without complication ([place])   . High cholesterol   . History of gout   . Hypertension   . Myocardial infarction ([place]) 04/2018  . Neuropathy   . 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 Sleep apnea    Stop Bang score of 4  . STEMI (ST elevation myocardial infarction) ([place])    04/08/2018 PCI/DES RCA  . VT (ventricular tachycardia) ([place])    Past Surgical History:  Procedure Laterality Date  . APPENDECTOMY    . CARDIOVERSION N/A 04/13/2018   Procedure: CARDIOVERSION;  Surgeon: Skeet Latch, MD;  Location: [facility];  Service: Cardiovascular;  Laterality: N/A;  . CATARACT EXTRACTION W/PHACO Right 04/25/2014   Procedure: CATARACT EXTRACTION PHACO AND INTRAOCULAR LENS PLACEMENT ([place]);  Surgeon: Williams Che, MD;  Location: [facility];  Service: Ophthalmology;  Laterality: Right;  CDE:4.70  . CATARACT EXTRACTION W/PHACO Left 07/11/2014   Procedure: CATARACT EXTRACTION PHACO AND INTRAOCULAR LENS PLACEMENT LEFT EYE CDE=8.32;  Surgeon: Williams Che, MD;  Location: [facility];  Service: Ophthalmology;  Laterality: Left;  . COLONOSCOPY WITH PROPOFOL N/A 09/23/2016   Procedure: COLONOSCOPY WITH PROPOFOL;  Surgeon: Daneil Dolin, MD;  [facility];  Service: Endoscopy;  Laterality: N/A;  . CORONARY/GRAFT ACUTE MI REVASCULARIZATION N/A 04/07/2018   Procedure: Coronary/Graft Acute MI Revascularization;  Surgeon: Burnell Blanks, MD;  Location: [facility];  Service: Cardiovascular;  Laterality: N/A;  . LEFT HEART CATH AND CORONARY ANGIOGRAPHY N/A 04/07/2018   Procedure: LEFT HEART CATH AND  CORONARY ANGIOGRAPHY;  Surgeon: Burnell Blanks, MD;  Location: [facility];  Service: Cardiovascular;  Laterality: N/A;  . POLYPECTOMY  09/23/2016   Procedure: POLYPECTOMY;  Surgeon: Daneil Dolin, MD;  [facility];  Service: Endoscopy;;  colon  . RETINAL DETACHMENT SURGERY     Social History   Socioeconomic History  . Marital status: Married    Spouse name: Not on file  . Number of children: Not on file  . Years of education: Not on file  . Highest education level: Not on file  Occupational History  . Not on file  Social Needs  . Financial resource strain: Not on file  . Food insecurity:    Worry: Not on file    Inability: Not on file  . Transportation needs:    Medical: Not on file    Non-medical: Not on file  Tobacco Use  . Smoking status: Former Smoker    Packs/day: 1.50    Years: 30.00    Pack years: 45.00    Types: Cigarettes    Last attempt to quit: 07/05/2012    Years since quitting: 5.8  . Smokeless tobacco: Never Used  Substance and Sexual Activity  . Alcohol use: No  . Drug use: No  . Sexual activity: Not Currently    Birth control/protection: None  Lifestyle  . Physical activity:    Days per week: Not on file    Minutes per session:  Not on file  . Stress: Not on file  Relationships  . Social connections:    Talks on phone: Not on file    Gets together: Not on file    Attends religious service: Not on file    Active member of club or organization: Not on file    Attends meetings of clubs or organizations: Not on file    Relationship status: Not on file  Other Topics Concern  . Not on file  Social History Narrative  . Not on file   Outpatient Encounter Medications as of 04/24/2018  Medication Sig  . ALPRAZolam (XANAX) 0.5 MG tablet Take 0.5 mg by mouth 2 (two) times daily as needed for anxiety.  Marland Kitchen amiodarone (PACERONE) 200 MG tablet Take 1 tablet (200 mg total) by mouth daily.  Marland Kitchen apixaban (ELIQUIS) 5 MG TABS tablet Take 1  tablet (5 mg total) by mouth 2 (two) times daily.  Marland Kitchen aspirin EC 81 MG tablet Take 81 mg by mouth every evening.   Marland Kitchen atorvastatin (LIPITOR) 40 MG tablet Take 1 tablet (40 mg total) by mouth daily at 6 PM.  . B Complex-C (SUPER B COMPLEX PO) Take 1 tablet by mouth daily.   . carvedilol (COREG) 3.125 MG tablet Take 1 tablet (3.125 mg total) by mouth 2 (two) times daily.  . clopidogrel (PLAVIX) 75 MG tablet Take 1 tablet (75 mg total) by mouth daily.  . DULoxetine (CYMBALTA) 60 MG capsule Take 60 mg by mouth daily with lunch.   . ergocalciferol (VITAMIN D2) 50000 units capsule Take 50,000 Units by mouth every Tuesday.   Marland Kitchen glucose blood test strip 1 each by Other route 2 (two) times daily. Use as instructed 2 x daily, One touch Ultra  . HYDROcodone-acetaminophen (NORCO/VICODIN) 5-325 MG per tablet Take 1 tablet by mouth every 6 (six) hours as needed for moderate pain.  . Insulin Glargine (BASAGLAR KWIKPEN) 100 UNIT/ML SOPN INJECT 50 UNITS SUBCUTANEOUSLY AT BEDTIME (Patient taking differently: Inject 35 Units into the skin every evening. )  . lisinopril (PRINIVIL,ZESTRIL) 20 MG tablet Take 1 tablet (20 mg total) by mouth daily.  . metFORMIN (GLUCOPHAGE) 1000 MG tablet Take 1 tablet (1,000 mg total) by mouth 2 (two) times daily.  . nitroGLYCERIN (NITROSTAT) 0.4 MG SL tablet Place 1 tablet (0.4 mg total) under the tongue every 5 (five) minutes as needed.  . pregabalin (LYRICA) 150 MG capsule Take 150 mg by mouth at bedtime.  Marland Kitchen rOPINIRole (REQUIP) 1 MG tablet Take 1 mg by mouth 3 (three) times daily.  . timolol (TIMOPTIC) 0.5 % ophthalmic solution Place 1 drop into both eyes 2 (two) times daily.   Marland Kitchen UNIFINE PENTIPS 31G X 5 MM MISC USE TO INJECT INSULIN AT BEDTIME (Patient taking differently: at bedtime. )   No facility-administered encounter medications on file as of 04/24/2018.    ALLERGIES: No Known Allergies VACCINATION STATUS:  There is no immunization history on file for this patient.  Diabetes   He presents for his follow-up (Telephone visit) diabetic visit. He has type 2 diabetes mellitus. His disease course has been stable. There are no hypoglycemic associated symptoms. Pertinent negatives for hypoglycemia include no confusion, headaches, pallor or seizures. Pertinent negatives for diabetes include no chest pain, no fatigue, no polydipsia, no polyphagia, no polyuria and no weakness. There are no hypoglycemic complications. Symptoms are stable. Diabetic complications include heart disease. Risk factors for coronary artery disease include diabetes mellitus, hypertension and tobacco exposure. Current diabetic treatment includes insulin injections  and oral agent (monotherapy). He is compliant with treatment most of the time. He is following a generally unhealthy diet. When asked about meal planning, he reported none. He has had a previous visit with a dietitian. He participates in exercise intermittently. There is no change in his home blood glucose trend. His breakfast blood glucose range is generally 140-180 mg/dl. His bedtime blood glucose range is generally 140-180 mg/dl. His overall blood glucose range is 140-180 mg/dl. (This is a telephone visit: Patient reports that his fasting blood glucose readings are between 140 and 180, bedtime readings are between 150 and 200.  His previsit A1c is 6.9% unchanged from last visit.) An ACE inhibitor/angiotensin II receptor blocker is being taken.  Hyperlipidemia  This is a chronic problem. The current episode started more than 1 year ago. The problem is controlled. Exacerbating diseases include diabetes. Pertinent negatives include no chest pain, myalgias or shortness of breath. Current antihyperlipidemic treatment includes statins. Risk factors for coronary artery disease include dyslipidemia, diabetes mellitus, hypertension, male sex and a sedentary lifestyle.  Hypertension  This is a chronic problem. The current episode started more than 1 year ago.  Pertinent negatives include no chest pain, headaches, neck pain, palpitations or shortness of breath. Risk factors for coronary artery disease include diabetes mellitus, dyslipidemia, male gender, sedentary lifestyle and smoking/tobacco exposure.    Objective:    There were no vitals taken for this visit.  Wt Readings from Last 3 Encounters:  04/21/18 216 lb 3.2 oz (98.1 kg)  04/14/18 211 lb 1.6 oz (95.8 kg)  12/23/17 219 lb (99.3 kg)       Results for orders placed or performed in visit on 04/17/18  Comprehensive metabolic panel  Result Value Ref Range   Glucose 131 (H) 65 - 99 mg/dL   BUN 12 8 - 27 mg/dL   Creatinine, Ser 0.90 0.76 - 1.27 mg/dL   GFR calc non Af Amer 89 >59 mL/min/1.73   GFR calc Af Amer 103 >59 mL/min/1.73   BUN/Creatinine Ratio 13 10 - 24   Sodium 134 134 - 144 mmol/L   Potassium 5.0 3.5 - 5.2 mmol/L   Chloride 99 96 - 106 mmol/L   CO2 20 20 - 29 mmol/L   Calcium 8.8 8.6 - 10.2 mg/dL   Total Protein 6.0 6.0 - 8.5 g/dL   Albumin 3.7 (L) 3.8 - 4.8 g/dL   Globulin, Total 2.3 1.5 - 4.5 g/dL   Albumin/Globulin Ratio 1.6 1.2 - 2.2   Bilirubin Total 0.7 0.0 - 1.2 mg/dL   Alkaline Phosphatase 74 39 - 117 IU/L   AST 17 0 - 40 IU/L   ALT 66 (H) 0 - 44 IU/L  Lipid Panel w/o Chol/HDL Ratio  Result Value Ref Range   Cholesterol, Total 107 100 - 199 mg/dL   Triglycerides 92 0 - 149 mg/dL   HDL 32 (L) >39 mg/dL   VLDL Cholesterol Cal 18 5 - 40 mg/dL   LDL Calculated 57 0 - 99 mg/dL  Hgb A1c w/o eAG  Result Value Ref Range   Hgb A1c MFr Bld 6.9 (H) 4.8 - 5.6 %  TSH  Result Value Ref Range   TSH 0.668 0.450 - 4.500 uIU/mL  VITAMIN D 25 Hydroxy (Vit-D Deficiency, Fractures)  Result Value Ref Range   Vit D, 25-Hydroxy 74.2 30.0 - 100.0 ng/mL  Microalbumin, urine  Result Value Ref Range   Microalbumin, Urine 5.8 Not Estab. ug/mL  T4  Result Value Ref Range  T4, Total 9.2 4.5 - 12.0 ug/dL   Diabetic Labs (most recent): Lab Results  Component Value Date    HGBA1C 6.9 (H) 04/17/2018   HGBA1C 6.7 (H) 12/16/2017   HGBA1C 6.6 (H) 08/14/2017   Lipid Panel     Component Value Date/Time   CHOL 107 04/17/2018 0829   TRIG 92 04/17/2018 0829   HDL 32 (L) 04/17/2018 0829   CHOLHDL 4.4 04/07/2018 1349   VLDL 18 04/07/2018 1349   LDLCALC 57 04/17/2018 0829    Assessment & Plan:   1. Uncontrolled type 2 diabetes mellitus with complication, with long-term current use of insulin   -This is a telephone visit during COVID-19 pandemic . -Verbal consent was obtained from the patient via telephone. -His previsit labs were reviewed which showed A1c of 6.9% remains stable.   -Since last visit with me patient developed acute ST elevation myocardial infarction which required placement of drug-eluting stent on May 04, 2018.    -He remains at extremely  high risk for more  acute and chronic complications of diabetes which include CAD, CVA, CKD, retinopathy, and neuropathy. These are all discussed in detail with the patient. -He reports his blood glucose readings are between 140 and 180 fasting and 150 and 200 at bedtime. -Based on his response to treatment with A1c of 6.9%, he will not require prandial insulin for now.  -He is advised to continue Basaglar 50 units nightly, associated with monitoring of blood glucose twice a day-daily before breakfast and at bedtime.    -He is encouraged to call clinic if blood glucose readings are below 70 or above 2003. -He is advised to continue metformin 1000 mg p.o. twice daily-daily with breakfast and supper.   2) BP/HTN:   He is advised to continue his current blood pressure medications including lisinopril 40 mg p.o. daily.  3) Lipids/HPL: His recent lipid panel showed controlled LDL at 57.  He is advised to continue simvastatin 20 mg p.o. nightly.    - Time spent with the patient: 25 min, of which >50% was spent in reviewing his  current and  previous labs/studies, previous treatments, and medications doses and  developing a plan for long-term care based on the latest recommendations for standards of care. Xavier Caldwell participated via telephone in the discussions, expressed understanding, and voiced agreement with the above plans.  All questions were answered to his satisfaction. he is encouraged to contact clinic should he have any questions or concerns prior to his return visit.   Follow up plan: Return in about 3 months (around 07/25/2018) for Follow up with Pre-visit Labs, Meter, and Logs.  Glade Lloyd, MD Phone: [phone]  Fax: [phone]  This note was partially dictated with voice recognition software. Similar sounding words can be transcribed inadequately or may not  be corrected upon review.  04/24/2018, 8:59 AM

## 2018-05-06 DIAGNOSIS — R69 Illness, unspecified: Secondary | ICD-10-CM | POA: Diagnosis not present

## 2018-05-16 ENCOUNTER — Other Ambulatory Visit: Payer: Self-pay | Admitting: "Endocrinology

## 2018-06-07 IMAGING — DX DG CERVICAL SPINE COMPLETE 4+V
6 series · 6 of 6 positions shown · non-contrast
Comparison: December 28, 2010

CLINICAL DATA: Cervicalgia for several weeks

EXAM:
CERVICAL SPINE - COMPLETE 4+ VIEW

[c-spine lat]
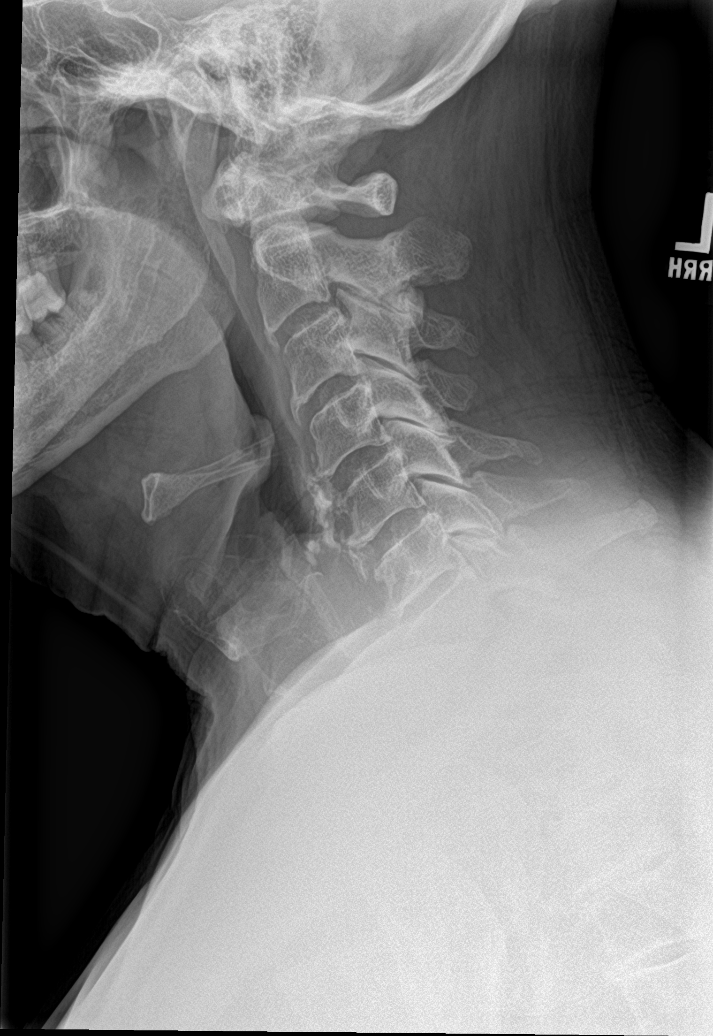

[c-spine obl (1 of 2)]
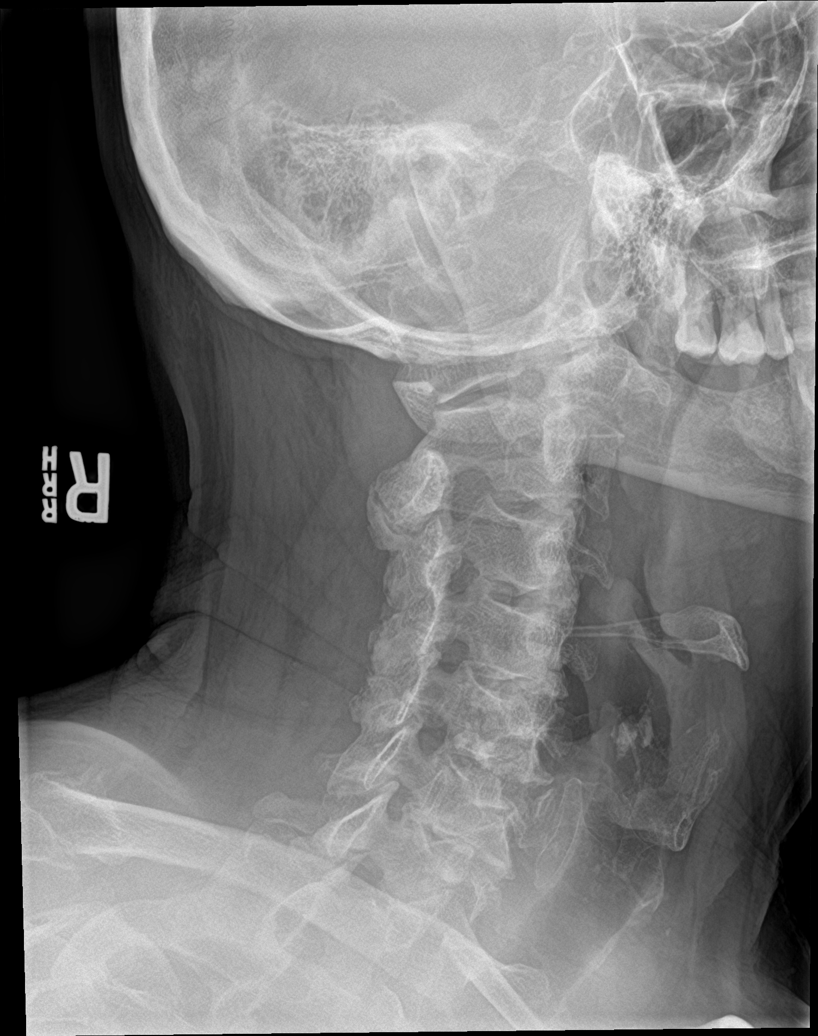

[c-spine obl (2 of 2)]
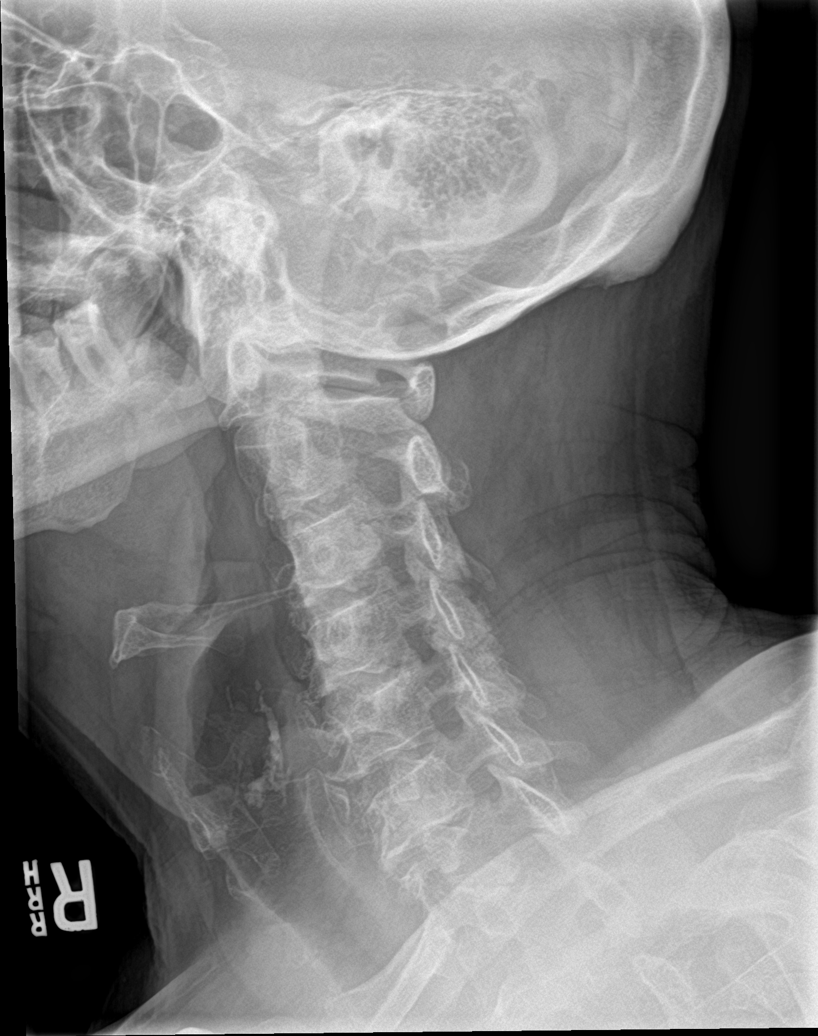

[c-spine ap]
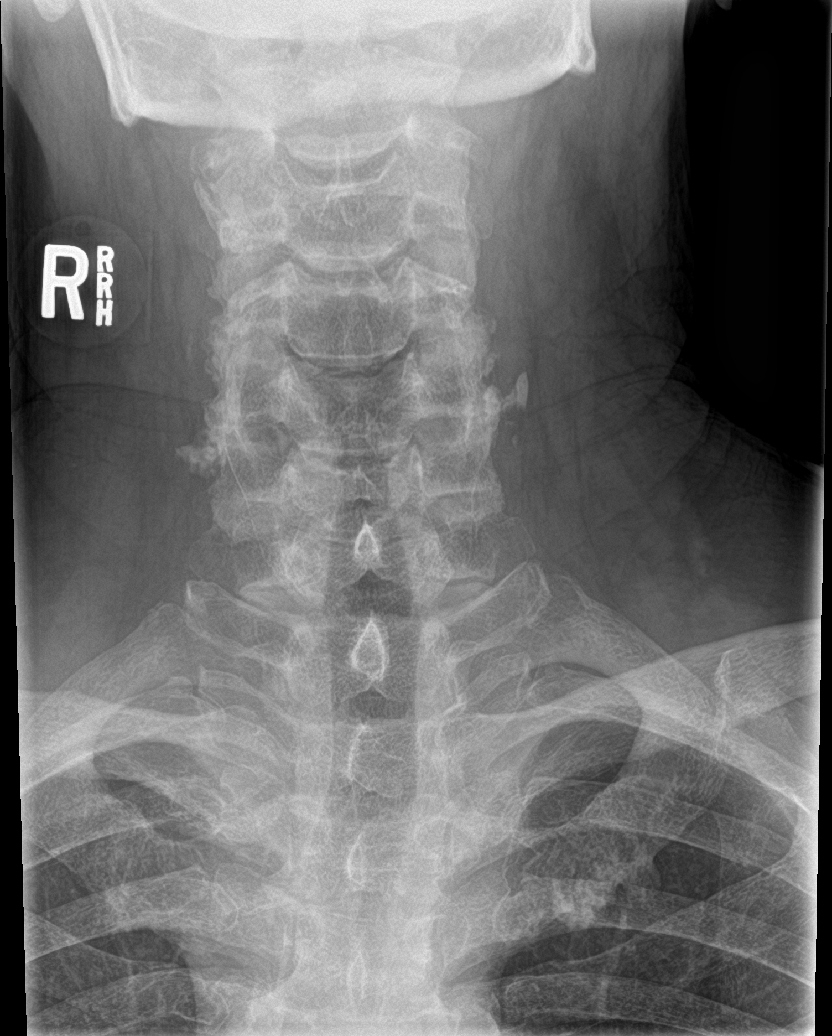

[c-spine open mouth]
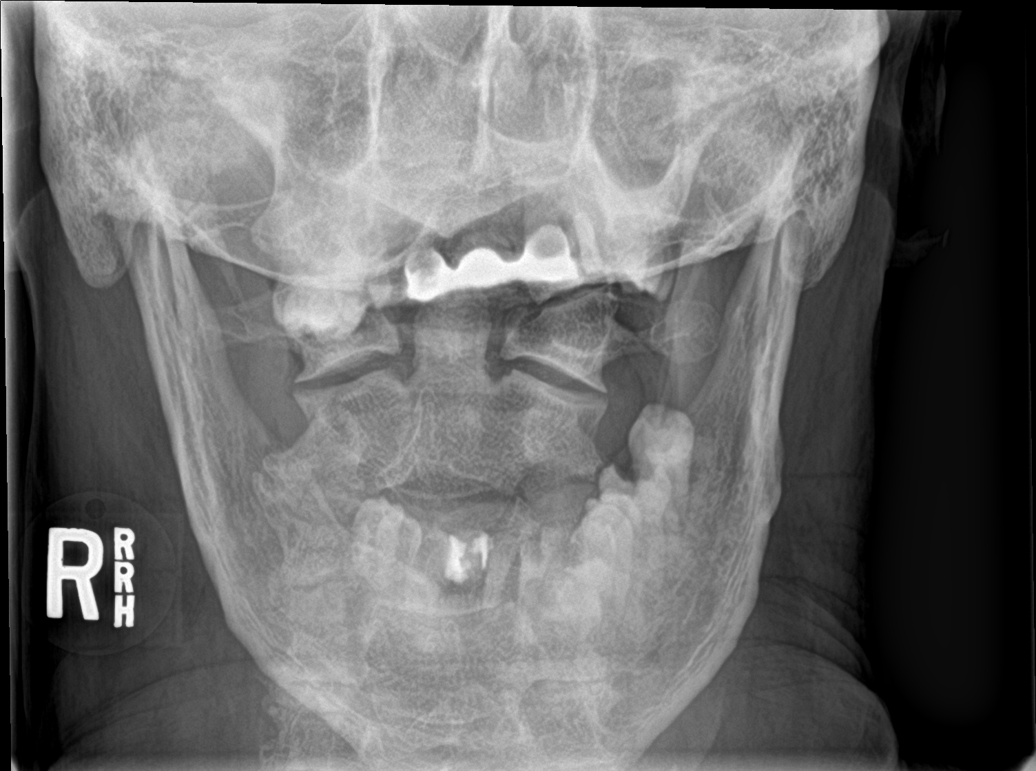

[c-spine swimmers trauma]
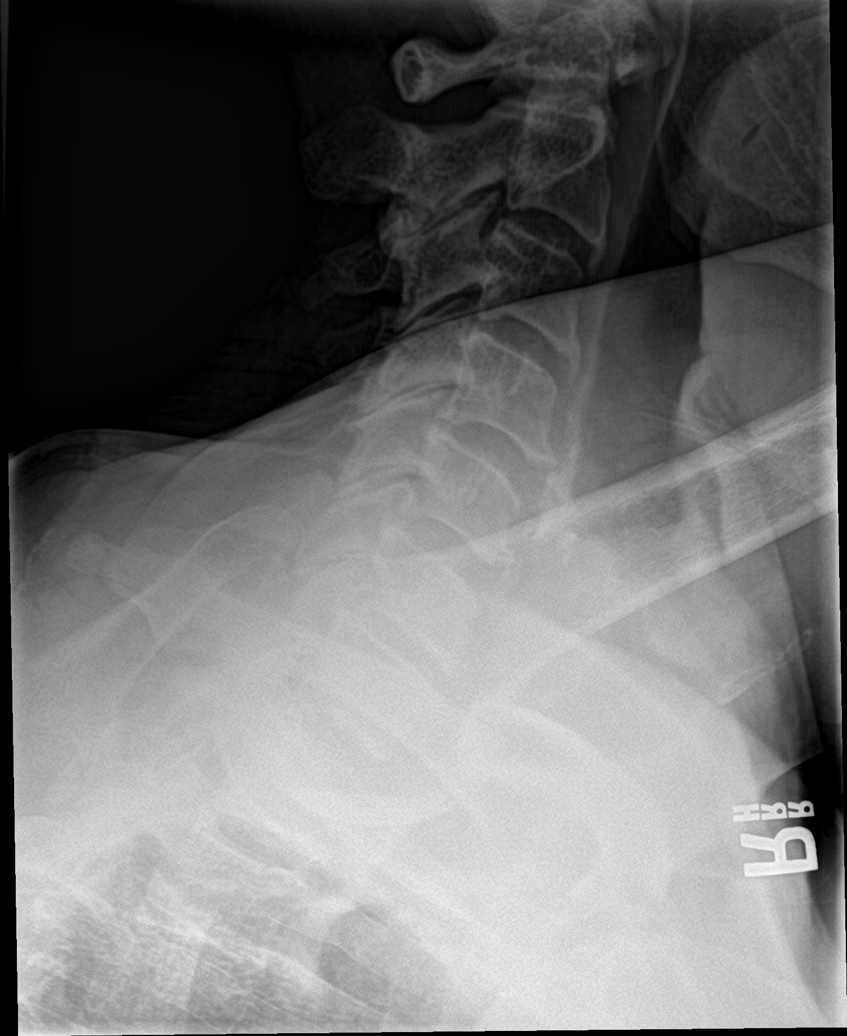

[6 of 6 positions shown; findings below may reference images not displayed]

FINDINGS: Frontal, lateral, open-mouth odontoid, and bilateral oblique views
were obtained. There is no fracture or spondylolisthesis.
Prevertebral soft tissues and predental space regions are normal.
There are anterior osteophytes at C3, C4, C5, and C6. Foci of
calcification in the anterior ligament ower noted at C3, C4, C5, and
C6. There is slight disc space narrowing and C6-7. Other disc spaces
appear unremarkable. There is exit foraminal narrowing due to facet
hypertrophy at C6-7 bilaterally. There is calcification in both
carotid arteries. Lung apices are clear.
IMPRESSION: Osteoarthritic change, primarily at C6-7. No fracture or
spondylolisthesis. There is bilateral carotid artery calcification.

## 2018-06-09 ENCOUNTER — Other Ambulatory Visit: Payer: Self-pay | Admitting: Cardiology

## 2018-06-10 ENCOUNTER — Other Ambulatory Visit: Payer: Self-pay | Admitting: *Deleted

## 2018-06-10 MED ORDER — APIXABAN 5 MG PO TABS: 5.0000 mg | ORAL_TABLET | Freq: Two times a day (BID) | ORAL | 6 refills | Status: DC

## 2018-06-17 DIAGNOSIS — R2689 Other abnormalities of gait and mobility: Secondary | ICD-10-CM | POA: Diagnosis not present

## 2018-06-17 DIAGNOSIS — M461 Sacroiliitis, not elsewhere classified: Secondary | ICD-10-CM | POA: Diagnosis not present

## 2018-06-17 DIAGNOSIS — M5002 Cervical disc disorder with myelopathy, mid-cervical region, unspecified level: Secondary | ICD-10-CM | POA: Diagnosis not present

## 2018-06-17 DIAGNOSIS — E1140 Type 2 diabetes mellitus with diabetic neuropathy, unspecified: Secondary | ICD-10-CM | POA: Diagnosis not present

## 2018-06-24 DIAGNOSIS — E6609 Other obesity due to excess calories: Secondary | ICD-10-CM | POA: Diagnosis not present

## 2018-06-24 DIAGNOSIS — R69 Illness, unspecified: Secondary | ICD-10-CM | POA: Diagnosis not present

## 2018-06-24 DIAGNOSIS — Z0001 Encounter for general adult medical examination with abnormal findings: Secondary | ICD-10-CM | POA: Diagnosis not present

## 2018-06-24 DIAGNOSIS — Z1389 Encounter for screening for other disorder: Secondary | ICD-10-CM | POA: Diagnosis not present

## 2018-06-24 DIAGNOSIS — I10 Essential (primary) hypertension: Secondary | ICD-10-CM | POA: Diagnosis not present

## 2018-06-24 DIAGNOSIS — E7849 Other hyperlipidemia: Secondary | ICD-10-CM | POA: Diagnosis not present

## 2018-06-24 DIAGNOSIS — M150 Primary generalized (osteo)arthritis: Secondary | ICD-10-CM | POA: Diagnosis not present

## 2018-06-24 DIAGNOSIS — Z6831 Body mass index (BMI) 31.0-31.9, adult: Secondary | ICD-10-CM | POA: Diagnosis not present

## 2018-06-25 ENCOUNTER — Other Ambulatory Visit: Payer: Self-pay | Admitting: "Endocrinology

## 2018-06-25 DIAGNOSIS — H33022 Retinal detachment with multiple breaks, left eye: Secondary | ICD-10-CM | POA: Diagnosis not present

## 2018-06-25 DIAGNOSIS — H35371 Puckering of macula, right eye: Secondary | ICD-10-CM | POA: Diagnosis not present

## 2018-06-25 DIAGNOSIS — H401121 Primary open-angle glaucoma, left eye, mild stage: Secondary | ICD-10-CM | POA: Diagnosis not present

## 2018-06-25 DIAGNOSIS — E113553 Type 2 diabetes mellitus with stable proliferative diabetic retinopathy, bilateral: Secondary | ICD-10-CM | POA: Diagnosis not present

## 2018-07-02 ENCOUNTER — Telehealth (HOSPITAL_COMMUNITY): Payer: Self-pay

## 2018-07-02 NOTE — Telephone Encounter (Signed)
Left voice mail regarding referral to Cardiac Rehab to return call if he is interested in attending the program.

## 2018-07-07 ENCOUNTER — Telehealth (HOSPITAL_COMMUNITY): Payer: Self-pay | Admitting: Physical Therapy

## 2018-07-07 NOTE — Telephone Encounter (Signed)
Xavier Caldwell advising the pt that our clinic has re-opened and he is welcome to give [place] a call back to get scheduled

## 2018-07-22 ENCOUNTER — Other Ambulatory Visit: Payer: Self-pay | Admitting: "Endocrinology

## 2018-07-22 ENCOUNTER — Telehealth (HOSPITAL_COMMUNITY): Payer: Self-pay

## 2018-07-22 DIAGNOSIS — E1165 Type 2 diabetes mellitus with hyperglycemia: Secondary | ICD-10-CM | POA: Diagnosis not present

## 2018-07-22 DIAGNOSIS — E118 Type 2 diabetes mellitus with unspecified complications: Secondary | ICD-10-CM | POA: Diagnosis not present

## 2018-07-22 DIAGNOSIS — E1121 Type 2 diabetes mellitus with diabetic nephropathy: Secondary | ICD-10-CM | POA: Diagnosis not present

## 2018-07-22 DIAGNOSIS — Z794 Long term (current) use of insulin: Secondary | ICD-10-CM | POA: Diagnosis not present

## 2018-07-22 NOTE — Telephone Encounter (Signed)
S/w the pt's wife again regarding the referral for her husband to schedule physical therapy. She stated that she will speak with him today and get back with me by today. Let her know that the referral will be closed if we don not hear back with in a certain time frame the referral will be closed

## 2018-07-23 LAB — COMPREHENSIVE METABOLIC PANEL
ALT: 20 IU/L (ref 0–44)
AST: 17 IU/L (ref 0–40)
Albumin/Globulin Ratio: 2 (ref 1.2–2.2)
Albumin: 4 g/dL (ref 3.8–4.8)
Alkaline Phosphatase: 74 IU/L (ref 39–117)
BUN/Creatinine Ratio: 18 (ref 10–24)
BUN: 17 mg/dL (ref 8–27)
Bilirubin Total: 0.4 mg/dL (ref 0.0–1.2)
CO2: 24 mmol/L (ref 20–29)
Calcium: 9.2 mg/dL (ref 8.6–10.2)
Chloride: 104 mmol/L (ref 96–106)
Creatinine, Ser: 0.94 mg/dL (ref 0.76–1.27)
GFR calc Af Amer: 97 mL/min/{1.73_m2} (ref >59)
GFR calc non Af Amer: 84 mL/min/{1.73_m2} (ref >59)
Globulin, Total: 2 g/dL (ref 1.5–4.5)
Glucose: 124 mg/dL — ABNORMAL HIGH (ref 65–99)
Potassium: 4.9 mmol/L (ref 3.5–5.2)
Sodium: 140 mmol/L (ref 134–144)
Total Protein: 6 g/dL (ref 6.0–8.5)

## 2018-07-23 LAB — HGB A1C W/O EAG: Hgb A1c MFr Bld: 7.7 % — ABNORMAL HIGH (ref 4.8–5.6)

## 2018-07-24 ENCOUNTER — Telehealth: Payer: Self-pay | Admitting: Cardiology

## 2018-07-24 NOTE — Telephone Encounter (Signed)
Virtual Visit Pre-Appointment Phone Call  "(Name), I am calling you today to discuss your upcoming appointment. We are currently trying to limit exposure to the virus that causes COVID-19 by seeing patients at home rather than in the office."  1. "What is the BEST phone number to call the day of the visit?" - include this in appointment notes  2. Do you have or have access to (through a family member/friend) a smartphone with video capability that we can use for your visit?" a. If yes - list this number in appt notes as cell (if different from BEST phone #) and list the appointment type as a VIDEO visit in appointment notes b. If no - list the appointment type as a PHONE visit in appointment notes  3. Confirm consent - "In the setting of the current Covid19 crisis, you are scheduled for a (phone or video) visit with your provider on (date) at (time).  Just as we do with many in-office visits, in order for you to participate in this visit, we must obtain consent.  If you'd like, I can send this to your mychart (if signed up) or email for you to review.  Otherwise, I can obtain your verbal consent now.  All virtual visits are billed to your insurance company just like a normal visit would be.  By agreeing to a virtual visit, we'd like you to understand that the technology does not allow for your provider to perform an examination, and thus may limit your provider's ability to fully assess your condition. If your provider identifies any concerns that need to be evaluated in person, we will make arrangements to do so.  Finally, though the technology is pretty good, we cannot assure that it will always work on either your or our end, and in the setting of a video visit, we may have to convert it to a phone-only visit.  In either situation, we cannot ensure that we have a secure connection.  Are you willing to proceed?" STAFF: Did the patient verbally acknowledge consent to telehealth visit? Document  YES/NO here: Yes  4. Advise patient to be prepared - "Two hours prior to your appointment, go ahead and check your blood pressure, pulse, oxygen saturation, and your weight (if you have the equipment to check those) and write them all down. When your visit starts, your provider will ask you for this information. If you have an Apple Watch or Kardia device, please plan to have heart rate information ready on the day of your appointment. Please have a pen and paper handy nearby the day of the visit as well."  5. Give patient instructions for MyChart download to smartphone OR [email or web address] as below if video visit (depending on what platform provider is using)  6. Inform patient they will receive a phone call 15 minutes prior to their appointment time (may be from unknown caller ID) so they should be prepared to answer    TELEPHONE CALL NOTE  Xavier Caldwell has been deemed a candidate for a follow-up tele-health visit to limit community exposure during the Covid-19 pandemic. I spoke with the patient via phone to ensure availability of phone/video source, confirm preferred email & phone number, and discuss instructions and expectations.  I reminded Xavier Caldwell to be prepared with any vital sign and/or heart rhythm information that could potentially be obtained via home monitoring, at the time of his visit. I reminded Xavier Caldwell to expect a phone call prior to  his visit.  Terry L Goins 07/24/2018 1:04 PM

## 2018-07-28 ENCOUNTER — Telehealth (INDEPENDENT_AMBULATORY_CARE_PROVIDER_SITE_OTHER): Payer: Medicare HMO | Admitting: Cardiology

## 2018-07-28 ENCOUNTER — Other Ambulatory Visit: Payer: Self-pay | Admitting: "Endocrinology

## 2018-07-28 ENCOUNTER — Encounter: Payer: Self-pay | Admitting: Cardiology

## 2018-07-28 ENCOUNTER — Other Ambulatory Visit: Payer: Self-pay

## 2018-07-28 VITALS — BP 157/100 | HR 71 | Ht 72.0 in | Wt 220.0 lb

## 2018-07-28 DIAGNOSIS — I2510 Atherosclerotic heart disease of native coronary artery without angina pectoris: Secondary | ICD-10-CM

## 2018-07-28 DIAGNOSIS — I10 Essential (primary) hypertension: Secondary | ICD-10-CM

## 2018-07-28 DIAGNOSIS — I480 Paroxysmal atrial fibrillation: Secondary | ICD-10-CM

## 2018-07-28 NOTE — Progress Notes (Signed)
Virtual Visit via Telephone Note   This visit type was conducted due to national recommendations for restrictions regarding the COVID-19 Pandemic (e.g. social distancing) in an effort to limit this patient's exposure and mitigate transmission in our community.  Due to his co-morbid illnesses, this patient is at least at moderate risk for complications without adequate follow up.  This format is felt to be most appropriate for this patient at this time.  The patient did not have access to video technology/had technical difficulties with video requiring transitioning to audio format only (telephone).  All issues noted in this document were discussed and addressed.  No physical exam could be performed with this format.  Please refer to the patient's chart for his  consent to telehealth for [facility].   Date:  07/28/2018   ID:  Xavier Caldwell, DOB February 13, 1950, MRN [id number]  Patient Location: Home Provider Location: Home  PCP:  Redmond School, MD  Cardiologist:  Carlyle Dolly, MD  Electrophysiologist:  None   Evaluation Performed:  Follow-Up Visit  Chief Complaint:  Follow up visit  History of Present Illness:    Xavier Caldwell is a 68 y.o. male seen today for follow up of the following medical problem.s   1. CAD - admit 04/2018 with inferior STEMI - received DES to RCA. Complicated VT arrest requiring defib in the cath lab and by bradycardia, required temp pacer. Beta blocker not started due to bradycardia Due to afib was on triple therapy ASA/plavix/DOAC - 04/2018 echo LVEF 45-50%,   - coreg 3.125mg  bid started last visit - no recent chest pain. Some SOB at times though infrequent.  - walking regularly out in his yard, several laps.  - compliant with meds   2. Afib - new diagnosis during 04/2018 admission with STEMI - management complicated by bradycardia - was on admiodarone, s/p DCCV. Amiodarone was discontinued at outpatient f/u - started on eliquis    - no recent  palpitations - no bleeding on eliquis   3. HTN - compliant with meds    The patient does not have symptoms concerning for COVID-19 infection (fever, chills, cough, or new shortness of breath).    Past Medical History:  Diagnosis Date  . Acute systolic (congestive) heart failure ([place])   . Anxiety   . Arthritis   . Atrial fibrillation ([place])   . Back pain   . Coronary artery disease   . Diabetes mellitus without complication ([place])   . High cholesterol   . History of gout   . Hypertension   . Myocardial infarction ([place]) 04/2018  . Neuropathy   . 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 Sleep apnea    Stop Bang score of 4  . STEMI (ST elevation myocardial infarction) ([place])    04/08/2018 PCI/DES RCA  . VT (ventricular tachycardia) ([place])    Past Surgical History:  Procedure Laterality Date  . APPENDECTOMY    . CARDIOVERSION N/A 04/13/2018   Procedure: CARDIOVERSION;  Surgeon: Skeet Latch, MD;  Location: [place];  Service: Cardiovascular;  Laterality: N/A;  . CATARACT EXTRACTION W/PHACO Right 04/25/2014   Procedure: CATARACT EXTRACTION PHACO AND INTRAOCULAR LENS PLACEMENT ([place]);  Surgeon: Williams Che, MD;  Location: [facility];  Service: Ophthalmology;  Laterality: Right;  CDE:4.70  . CATARACT EXTRACTION W/PHACO Left 07/11/2014   Procedure: CATARACT EXTRACTION PHACO AND INTRAOCULAR LENS PLACEMENT LEFT EYE CDE=8.32;  Surgeon: Williams Che, MD;  Location: [facility];  Service: Ophthalmology;  Laterality: Left;  . COLONOSCOPY WITH PROPOFOL N/A 09/23/2016   Procedure: COLONOSCOPY WITH PROPOFOL;  Surgeon: Daneil Dolin, MD;  [facility];  Service: Endoscopy;  Laterality: N/A;  . CORONARY/GRAFT ACUTE MI REVASCULARIZATION N/A 04/07/2018   Procedure: Coronary/Graft Acute MI Revascularization;  Surgeon: [place] Blanks, MD;  Location: [place] CV [facility];  Service: Cardiovascular;  Laterality: N/A;  . LEFT HEART CATH AND CORONARY ANGIOGRAPHY  N/A 04/07/2018   Procedure: LEFT HEART CATH AND CORONARY ANGIOGRAPHY;  Surgeon: Burnell Blanks, MD;  Location: [place] CV LAB;  Service: Cardiovascular;  Laterality: N/A;  . POLYPECTOMY  09/23/2016   Procedure: POLYPECTOMY;  Surgeon: Daneil Dolin, MD;  [facility];  Service: Endoscopy;;  colon  . RETINAL DETACHMENT SURGERY       Current Meds  Medication Sig  . ALPRAZolam (XANAX) 0.5 MG tablet Take 0.5 mg by mouth 2 (two) times daily as needed for anxiety.  Marland Kitchen apixaban (ELIQUIS) 5 MG TABS tablet Take 1 tablet (5 mg total) by mouth 2 (two) times daily.  Marland Kitchen atorvastatin (LIPITOR) 40 MG tablet TAKE 1 TABLET BY MOUTH ONCE DAILY AT 6PM  . B Complex-C (SUPER B COMPLEX PO) Take 1 tablet by mouth daily.   . carvedilol (COREG) 3.125 MG tablet Take 1 tablet (3.125 mg total) by mouth 2 (two) times daily.  . clopidogrel (PLAVIX) 75 MG tablet Take 1 tablet (75 mg total) by mouth daily.  . DULoxetine (CYMBALTA) 60 MG capsule Take 60 mg by mouth daily with lunch.   . ergocalciferol (VITAMIN D2) 50000 units capsule Take 50,000 Units by mouth every Tuesday.   Marland Kitchen glucose blood test strip 1 each by Other route 2 (two) times daily. Use as instructed 2 x daily, One touch Ultra  . HYDROcodone-acetaminophen (NORCO/VICODIN) 5-325 MG per tablet Take 1 tablet by mouth every 6 (six) hours as needed for moderate pain.  . Insulin Glargine (BASAGLAR KWIKPEN) 100 UNIT/ML SOPN INJECT 50 UNITS SUBCUTANEOUSLY AT BEDTIME  . lisinopril (PRINIVIL,ZESTRIL) 20 MG tablet Take 1 tablet (20 mg total) by mouth daily.  . metFORMIN (GLUCOPHAGE) 1000 MG tablet Take 1 tablet by mouth twice daily  . nitroGLYCERIN (NITROSTAT) 0.4 MG SL tablet Place 1 tablet (0.4 mg total) under the tongue every 5 (five) minutes as needed.  . pregabalin (LYRICA) 150 MG capsule Take 150 mg by mouth at bedtime.  Marland Kitchen rOPINIRole (REQUIP) 1 MG tablet Take 1 mg by mouth 3 (three) times daily.  . timolol (TIMOPTIC) 0.5 % ophthalmic solution  Place 1 drop into both eyes 2 (two) times daily.   Marland Kitchen UNIFINE PENTIPS 31G X 5 MM MISC USE TO INJECT INSULIN AT BEDTIME (Patient taking differently: at bedtime. )  . [DISCONTINUED] amiodarone (PACERONE) 200 MG tablet Take 1 tablet (200 mg total) by mouth daily.     Allergies:   Patient has no known allergies.   Social History   Tobacco Use  . Smoking status: Former Smoker    Packs/day: 1.50    Years: 30.00    Pack years: 45.00    Types: Cigarettes    Quit date: 07/05/2012    Years since quitting: 6.0  . Smokeless tobacco: Never Used  Substance Use Topics  . Alcohol use: No  . Drug use: No     Family Hx: The patient's family history includes Hypertension in his father. There is no history of Colon cancer.  ROS:   Please see the history of  present illness.     All other systems reviewed and are negative.   Prior CV studies:   The following studies were reviewed today:  Cath: 04/07/2018   Prox RCA lesion is 100% stenosed.  Ost LAD to Prox LAD lesion is 30% stenosed.  Mid LAD lesion is 40% stenosed.  Ost 2nd Mrg to 2nd Mrg lesion is 40% stenosed.  A drug-eluting stent was successfully placed using a STENT RESOLUTE ONYX 3.5X30.  Post intervention, there is a 0% residual stenosis.  1. 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 Ventricular fibrillation/cardiac arrest with successful resuscitation 3. Successful PTCA/DES x 1 proximal RCA.  4. Successful aspiration thrombectomy distal RCA 5. Mild to moderate non-obstructive disease in the Circumflex and LAD  Recommendations: Will admit to ICU. Echo in the am. Will continue Aggrastat for 18 hours. Will continue ASA/Brilinta and statin. Will not start a beta blocker today given hypotension and bradycardia. Ace inh on hold with hypotension.   TTE: 04/08/2018  IMPRESSIONS   1. The left ventricle has mildly reduced systolic function, with an ejection fraction of 45-50%. The cavity  size was normal. There is moderately increased left ventricular wall thickness. 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 Left ventricular diffuse hypokinesis. 2. The right ventricle has mildly reduced systolic function. The cavity was mildly enlarged. There is no increase in right ventricular wall thickness. 3. The mitral valve is degenerative. There is mild mitral annular calcification present. 4. The aortic valve was not well visualized. 5. The aortic root and ascending aorta are normal in size and structure. 6. The inferior vena cava was dilated in size with <50% respiratory variability. 7. Severe hypokinesis of the left ventricular basal to mid inferior and inferoseptal. 8. The interatrial septum was not well visualized.  Labs/Other Tests and Data Reviewed:    EKG:  No ECG reviewed.  Recent Labs: 04/14/2018: Hemoglobin 10.1; Magnesium 1.7; Platelets 234 04/17/2018: TSH 0.668 07/22/2018: ALT 20; BUN 17; Creatinine, Ser 0.94; Potassium 4.9; Sodium 140   Recent Lipid Panel Lab Results  Component Value Date/Time   CHOL 107 04/17/2018 08:29 AM   TRIG 92 04/17/2018 08:29 AM   HDL 32 (L) 04/17/2018 08:29 AM   CHOLHDL 4.4 04/07/2018 01:49 PM   LDLCALC 57 04/17/2018 08:29 AM    Wt Readings from Last 3 Encounters:  07/28/18 220 lb (99.8 kg)  04/21/18 216 lb 3.2 oz (98.1 kg)  04/14/18 211 lb 1.6 oz (95.8 kg)     Objective:    Vital Signs:  BP (!) 157/100   Pulse 71   Ht 6' (1.829 m)   Wt 220 lb (99.8 kg)   BMI 29.84 kg/m    Today's Vitals   07/28/18 0838  BP: (!) 157/100  Pulse: 71  Weight: 220 lb (99.8 kg)  Height: 6' (1.829 m)   Body mass index is 29.84 kg/m.  Normal affect. Normal speech pattern and tone. No audible signs of SOB or wheezing. Comfortable no apparent distress  ASSESSMENT & PLAN:    1. CAD - doing well after his MI in 04/2018 - no current symptoms - continue  current meds  2. PAF - no recent symptoms, continue current meds including anticoag  3. HTN - elevated today by his home measure. During his 04/2018 f/u bp's were more reasonable - he will submit bp log at the end of the week, if needed can titrate lisinopril  COVID-19 Education: The signs and symptoms of COVID-19 were discussed with the patient and how to seek care for testing (follow up with PCP or arrange E-visit).  The importance of social distancing was discussed today.  Time:   Today, I have spent 19 minutes with the patient with telehealth technology discussing the above problems.     Medication Adjustments/Labs and Tests Ordered: Current medicines are reviewed at length with the patient today.  Concerns regarding medicines are outlined above.   Tests Ordered: No orders of the defined types were placed in this encounter.   Medication Changes: No orders of the defined types were placed in this encounter.   Follow Up:  Virtual Visit in 4 month(s)  Signed, Carlyle Dolly, MD  07/28/2018 9:30 AM     [facility]

## 2018-07-28 NOTE — Patient Instructions (Signed)
Medication Instructions:  Your physician recommends that you continue on your current medications as directed. Please refer to the Current Medication list given to you today.   Labwork: None  Testing/Procedures: none  Follow-Up: Your physician wants you to follow-up in: 4 months.  You will receive a reminder letter in the mail two months in advance. If you don't receive a letter, please call our office to schedule the follow-up appointment.   Any Other Special Instructions Will Be Listed Below (If Applicable).  PLEASE KEEP YOUR BLOOD PRESSURE FOR REST OF WEEK, THEN CALL Friday WITH UPDATE.    If you need a refill on your cardiac medications before your next appointment, please call your pharmacy.

## 2018-07-29 ENCOUNTER — Encounter: Payer: Self-pay | Admitting: "Endocrinology

## 2018-07-29 ENCOUNTER — Ambulatory Visit (INDEPENDENT_AMBULATORY_CARE_PROVIDER_SITE_OTHER): Payer: Medicare HMO | Admitting: "Endocrinology

## 2018-07-29 ENCOUNTER — Other Ambulatory Visit: Payer: Self-pay

## 2018-07-29 DIAGNOSIS — I10 Essential (primary) hypertension: Secondary | ICD-10-CM | POA: Diagnosis not present

## 2018-07-29 DIAGNOSIS — E782 Mixed hyperlipidemia: Secondary | ICD-10-CM

## 2018-07-29 DIAGNOSIS — Z794 Long term (current) use of insulin: Secondary | ICD-10-CM | POA: Diagnosis not present

## 2018-07-29 DIAGNOSIS — E118 Type 2 diabetes mellitus with unspecified complications: Secondary | ICD-10-CM | POA: Diagnosis not present

## 2018-07-29 DIAGNOSIS — E1165 Type 2 diabetes mellitus with hyperglycemia: Secondary | ICD-10-CM | POA: Diagnosis not present

## 2018-07-29 DIAGNOSIS — IMO0002 Reserved for concepts with insufficient information to code with codable children: Secondary | ICD-10-CM

## 2018-07-29 MED ORDER — ONETOUCH ULTRA VI STRP: ORAL_STRIP | 0 refills | Status: DC

## 2018-07-29 NOTE — Progress Notes (Signed)
07/29/2018                                                    Endocrinology Telehealth Visit Follow up Note -During COVID -19 Pandemic  This visit type was conducted due to national recommendations for restrictions regarding the COVID-19 Pandemic  in an effort to limit this patient's exposure and mitigate transmission of the corona virus.  Due to his co-morbid illnesses, Xavier Caldwell is at  moderate to high risk for complications without adequate follow up.  This format is felt to be most appropriate for him at this time.  I connected with this patient on 07/29/2018   by telephone and verified that I am speaking with the correct person using two identifiers. Xavier Caldwell, Feb 15, 1950. he has verbally consented to this visit. All issues noted in this document were discussed and addressed. The format was not optimal for physical exam.    Subjective:    Patient ID: Xavier Caldwell, male    DOB: September 20, 1950,    Past Medical History:  Diagnosis Date  . Acute systolic (congestive) heart failure ([place])   . Anxiety   . Arthritis   . Atrial fibrillation ([place])   . Back pain   . Coronary artery disease   . Diabetes mellitus without complication ([place])   . High cholesterol   . History of gout   . Hypertension   . Myocardial infarction ([place]) 04/2018  . Neuropathy   . 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 Sleep apnea    Stop Bang score of 4  . STEMI (ST elevation myocardial infarction) ([place])    04/08/2018 PCI/DES RCA  . VT (ventricular tachycardia) ([place])    Past Surgical History:  Procedure Laterality Date  . APPENDECTOMY    . CARDIOVERSION N/A 04/13/2018   Procedure: CARDIOVERSION;  Surgeon: Skeet Latch, MD;  Location: [place];  Service: Cardiovascular;  Laterality: N/A;  . CATARACT EXTRACTION W/PHACO Right 04/25/2014   Procedure: CATARACT EXTRACTION PHACO AND INTRAOCULAR LENS PLACEMENT ([place]);  Surgeon: Williams Che, MD;  Location[facility];  Service:  Ophthalmology;  Laterality: Right;  CDE:4.70  . CATARACT EXTRACTION W/PHACO Left 07/11/2014   Procedure: CATARACT EXTRACTION PHACO AND INTRAOCULAR LENS PLACEMENT LEFT EYE CDE=8.32;  Surgeon: Williams Che, MD;  [facility];  Service: Ophthalmology;  Laterality: Left;  . COLONOSCOPY WITH PROPOFOL N/A 09/23/2016   Procedure: COLONOSCOPY WITH PROPOFOL;  Surgeon: Daneil Dolin, MD;  [facility];  Service: Endoscopy;  Laterality: N/A;  . CORONARY/GRAFT ACUTE MI REVASCULARIZATION N/A 04/07/2018   Procedure: Coronary/Graft Acute MI Revascularization;  Surgeon: [place] Blanks, MD;  Location: [place] [facility];  Service: Cardiovascular;  Laterality: N/A;  . LEFT HEART CATH AND CORONARY ANGIOGRAPHY N/A 04/07/2018   Procedure: LEFT HEART CATH AND CORONARY ANGIOGRAPHY;  Surgeon: Burnell Blanks, MD;  Location: [place] [facility];  Service: Cardiovascular;  Laterality: N/A;  . POLYPECTOMY  09/23/2016   Procedure: POLYPECTOMY;  Surgeon: Daneil Dolin, MD;  [facility];  Service: Endoscopy;;  colon  . RETINAL DETACHMENT SURGERY     Social History   Socioeconomic History  . Marital status: Married    Spouse name: Not on file  . Number of children: Not on file  .  Years of education: Not on file  . Highest education level: Not on file  Occupational History  . Not on file  Social Needs  . Financial resource strain: Not on file  . Food insecurity    Worry: Not on file    Inability: Not on file  . Transportation needs    Medical: Not on file    Non-medical: Not on file  Tobacco Use  . Smoking status: Former Smoker    Packs/day: 1.50    Years: 30.00    Pack years: 45.00    Types: Cigarettes    Quit date: 07/05/2012    Years since quitting: 6.0  . Smokeless tobacco: Never Used  Substance and Sexual Activity  . Alcohol use: No  . Drug use: No  . Sexual activity: Not Currently    Birth control/protection: None  Lifestyle  . Physical activity    Days  per week: Not on file    Minutes per session: Not on file  . Stress: Not on file  Relationships  . Social Herbalist on phone: Not on file    Gets together: Not on file    Attends religious service: Not on file    Active member of club or organization: Not on file    Attends meetings of clubs or organizations: Not on file    Relationship status: Not on file  Other Topics Concern  . Not on file  Social History Narrative  . Not on file   Outpatient Encounter Medications as of 07/29/2018  Medication Sig  . ALPRAZolam (XANAX) 0.5 MG tablet Take 0.5 mg by mouth 2 (two) times daily as needed for anxiety.  Marland Kitchen apixaban (ELIQUIS) 5 MG TABS tablet Take 1 tablet (5 mg total) by mouth 2 (two) times daily.  Marland Kitchen atorvastatin (LIPITOR) 40 MG tablet TAKE 1 TABLET BY MOUTH ONCE DAILY AT 6PM  . B Complex-C (SUPER B COMPLEX PO) Take 1 tablet by mouth daily.   . carvedilol (COREG) 3.125 MG tablet Take 1 tablet (3.125 mg total) by mouth 2 (two) times daily.  . clopidogrel (PLAVIX) 75 MG tablet Take 1 tablet (75 mg total) by mouth daily.  . DULoxetine (CYMBALTA) 60 MG capsule Take 60 mg by mouth daily with lunch.   . ergocalciferol (VITAMIN D2) 50000 units capsule Take 50,000 Units by mouth every Tuesday.   Marland Kitchen glucose blood (ONETOUCH ULTRA) test strip USE ONE TEST STRIP AS DIRECTED TWICE DAILY  . HYDROcodone-acetaminophen (NORCO/VICODIN) 5-325 MG per tablet Take 1 tablet by mouth every 6 (six) hours as needed for moderate pain.  . Insulin Glargine (BASAGLAR KWIKPEN) 100 UNIT/ML SOPN INJECT 50 UNITS SUBCUTANEOUSLY AT BEDTIME  . lisinopril (PRINIVIL,ZESTRIL) 20 MG tablet Take 1 tablet (20 mg total) by mouth daily.  . metFORMIN (GLUCOPHAGE) 1000 MG tablet Take 1 tablet by mouth twice daily  . nitroGLYCERIN (NITROSTAT) 0.4 MG SL tablet Place 1 tablet (0.4 mg total) under the tongue every 5 (five) minutes as needed.  Marland Kitchen rOPINIRole (REQUIP) 1 MG tablet Take 1 mg by mouth 3 (three) times daily.  . timolol  (TIMOPTIC) 0.5 % ophthalmic solution Place 1 drop into both eyes 2 (two) times daily.   Marland Kitchen UNIFINE PENTIPS 31G X 5 MM MISC USE TO INJECT INSULIN AT BEDTIME (Patient taking differently: at bedtime. )  . [DISCONTINUED] ONETOUCH ULTRA test strip USE ONE TEST STRIP AS DIRECTED TWICE DAILY  . [DISCONTINUED] pregabalin (LYRICA) 150 MG capsule Take 150 mg by mouth at bedtime.  No facility-administered encounter medications on file as of 07/29/2018.    ALLERGIES: No Known Allergies VACCINATION STATUS:  There is no immunization history on file for this patient.  Diabetes He presents for his follow-up (Telephone visit) diabetic visit. He has type 2 diabetes mellitus. His disease course has been worsening. There are no hypoglycemic associated symptoms. Pertinent negatives for hypoglycemia include no confusion, headaches, pallor or seizures. Pertinent negatives for diabetes include no chest pain, no fatigue, no polydipsia, no polyphagia, no polyuria and no weakness. There are no hypoglycemic complications. Symptoms are worsening. Diabetic complications include heart disease. Risk factors for coronary artery disease include diabetes mellitus, hypertension and tobacco exposure. Current diabetic treatment includes insulin injections and oral agent (monotherapy). He is compliant with treatment most of the time. He is following a generally unhealthy diet. When asked about meal planning, he reported none. He has had a previous visit with a dietitian. He participates in exercise intermittently. There is no change in his home blood glucose trend. His breakfast blood glucose range is generally 130-140 mg/dl. His bedtime blood glucose range is generally 140-180 mg/dl. His overall blood glucose range is 140-180 mg/dl. An ACE inhibitor/angiotensin II receptor blocker is being taken.  Hyperlipidemia This is a chronic problem. The current episode started more than 1 year ago. The problem is controlled. Exacerbating diseases  include diabetes. Pertinent negatives include no chest pain, myalgias or shortness of breath. Current antihyperlipidemic treatment includes statins. Risk factors for coronary artery disease include dyslipidemia, diabetes mellitus, hypertension, male sex and a sedentary lifestyle.  Hypertension This is a chronic problem. The current episode started more than 1 year ago. Pertinent negatives include no chest pain, headaches, neck pain, palpitations or shortness of breath. Risk factors for coronary artery disease include diabetes mellitus, dyslipidemia, male gender, sedentary lifestyle and smoking/tobacco exposure.    Objective:    There were no vitals taken for this visit.  Wt Readings from Last 3 Encounters:  07/28/18 220 lb (99.8 kg)  04/21/18 216 lb 3.2 oz (98.1 kg)  04/14/18 211 lb 1.6 oz (95.8 kg)       Results for orders placed or performed in visit on 07/22/18  Comprehensive metabolic panel  Result Value Ref Range   Glucose 124 (H) 65 - 99 mg/dL   BUN 17 8 - 27 mg/dL   Creatinine, Ser 0.94 0.76 - 1.27 mg/dL   GFR calc non Af Amer 84 >59 mL/min/1.73   GFR calc Af Amer 97 >59 mL/min/1.73   BUN/Creatinine Ratio 18 10 - 24   Sodium 140 134 - 144 mmol/L   Potassium 4.9 3.5 - 5.2 mmol/L   Chloride 104 96 - 106 mmol/L   CO2 24 20 - 29 mmol/L   Calcium 9.2 8.6 - 10.2 mg/dL   Total Protein 6.0 6.0 - 8.5 g/dL   Albumin 4.0 3.8 - 4.8 g/dL   Globulin, Total 2.0 1.5 - 4.5 g/dL   Albumin/Globulin Ratio 2.0 1.2 - 2.2   Bilirubin Total 0.4 0.0 - 1.2 mg/dL   Alkaline Phosphatase 74 39 - 117 IU/L   AST 17 0 - 40 IU/L   ALT 20 0 - 44 IU/L  Hgb A1c w/o eAG  Result Value Ref Range   Hgb A1c MFr Bld 7.7 (H) 4.8 - 5.6 %   Diabetic Labs (most recent): Lab Results  Component Value Date   HGBA1C 7.7 (H) 07/22/2018   HGBA1C 6.9 (H) 04/17/2018   HGBA1C 6.7 (H) 12/16/2017   Lipid Panel  Component Value Date/Time   CHOL 107 04/17/2018 0829   TRIG 92 04/17/2018 0829   HDL 32 (L)  04/17/2018 0829   CHOLHDL 4.4 04/07/2018 1349   VLDL 18 04/07/2018 1349   LDLCALC 57 04/17/2018 0829    Assessment & Plan:   1. Uncontrolled type 2 diabetes mellitus with complication, with long-term current use of insulin  -He reports near target fasting glycemic profile between 94-1 55 and A1c of 7.7%, increasing from 6.9%.     -Since last visit with me patient developed acute ST elevation myocardial infarction which required placement of drug-eluting stent on May 04, 2018.    -He remains at extremely  high risk for more  acute and chronic complications of diabetes which include CAD, CVA, CKD, retinopathy, and neuropathy. These are all discussed in detail with the patient.  -He will not need prandial insulin for now, advised to continue Basaglar 50 units nightly associated with monitoring of blood glucose twice a day-daily before breakfast and at bedtime.    -He is encouraged to call clinic if blood glucose readings are below 70 or above 2003. -He is advised to continue metformin 1000 mg p.o. twice daily-daily with breakfast and supper.   2) BP/HTN:   He is advised to continue his current blood pressure medications including lisinopril 40 mg p.o. daily.  3) Lipids/HPL: His recent lipid panel showed controlled LDL at 57.  He is advised to continue simvastatin 20 mg p.o. nightly.    - Patient Care Time Today:  25 min, of which >50% was spent in reviewing his  current and  previous labs/studies, his blood glucose readings, previous treatments, and medications doses and developing a plan for long-term care based on the latest recommendations for standards of care.  Xavier Caldwell participated in the discussions, expressed understanding, and voiced agreement with the above plans.  All questions were answered to his satisfaction. he is encouraged to contact clinic should he have any questions or concerns prior to his return visit.    Follow up plan: No follow-ups on file.  Glade Lloyd,  MD Phone: [phone]  Fax: [phone]  This note was partially dictated with voice recognition software. Similar sounding words can be transcribed inadequately or may not  be corrected upon review.  07/29/2018, 11:14 AM

## 2018-07-31 ENCOUNTER — Telehealth: Payer: Self-pay | Admitting: Cardiology

## 2018-07-31 NOTE — Telephone Encounter (Signed)
BP Chk Results (6/23 BP: 135/81 Pulse:87, 6/24 BP: 128/74 Pulse: 79, 6/25 BP: 126/68 Pulse: 78, 6/26 BP: 135/80 Pulse: 74. Patients wife would like to know what the normal range is for him.)

## 2018-07-31 NOTE — Telephone Encounter (Signed)
BP's are reasonable, his goal is around 130s/80s or less    J BrancH MD

## 2018-07-31 NOTE — Telephone Encounter (Signed)
Wife notified of Dr.branch's message

## 2018-08-03 DIAGNOSIS — R69 Illness, unspecified: Secondary | ICD-10-CM | POA: Diagnosis not present

## 2018-08-04 DIAGNOSIS — R69 Illness, unspecified: Secondary | ICD-10-CM | POA: Diagnosis not present

## 2018-08-08 ENCOUNTER — Other Ambulatory Visit: Payer: Self-pay | Admitting: Cardiology

## 2018-09-07 ENCOUNTER — Telehealth: Payer: Self-pay | Admitting: Cardiology

## 2018-09-07 NOTE — Telephone Encounter (Signed)
New message    Pt c/o medication issue:  1. Name of Medication:  eliquis   2. How are you currently taking this medication (dosage and times per day)? 2x a day  3. Are you having a reaction (difficulty breathing--STAT)? no  4. What is your medication issue? Patient wife wants you to change the medication he is on they can not afford to buy the eliquis , they are in the donut hole

## 2018-09-08 NOTE — Telephone Encounter (Signed)
Spoke with wife concerning patent assistance. Wife states that the were denied for assistance 2 months ago. And she was asking for samples because he is in the donut hole and has to 150 for Eliquis a month. Offered two weeks of samples to wife. She was thankful and states she will pick up tomorrow.

## 2018-09-09 ENCOUNTER — Telehealth: Payer: Self-pay | Admitting: Cardiology

## 2018-09-09 NOTE — Telephone Encounter (Signed)
Having problems affording his apixaban (ELIQUIS) 5 MG TABS tablet [[id number]]  would like to know other options.   wife also states he's bleeding easily if he scratches his arm or bites his nails.   Please call [phone]

## 2018-09-09 NOTE — Telephone Encounter (Signed)
Spoke with wife who with bring info by office on tomorrow to complete patient assistance for Eliquis

## 2018-09-11 DIAGNOSIS — R69 Illness, unspecified: Secondary | ICD-10-CM | POA: Diagnosis not present

## 2018-09-14 DIAGNOSIS — E1140 Type 2 diabetes mellitus with diabetic neuropathy, unspecified: Secondary | ICD-10-CM | POA: Diagnosis not present

## 2018-09-14 DIAGNOSIS — M461 Sacroiliitis, not elsewhere classified: Secondary | ICD-10-CM | POA: Diagnosis not present

## 2018-09-14 DIAGNOSIS — M5002 Cervical disc disorder with myelopathy, mid-cervical region, unspecified level: Secondary | ICD-10-CM | POA: Diagnosis not present

## 2018-09-14 DIAGNOSIS — R2689 Other abnormalities of gait and mobility: Secondary | ICD-10-CM | POA: Diagnosis not present

## 2018-09-15 ENCOUNTER — Other Ambulatory Visit: Payer: Self-pay | Admitting: "Endocrinology

## 2018-09-17 ENCOUNTER — Other Ambulatory Visit: Payer: Self-pay | Admitting: "Endocrinology

## 2018-10-05 DIAGNOSIS — E1165 Type 2 diabetes mellitus with hyperglycemia: Secondary | ICD-10-CM | POA: Diagnosis not present

## 2018-10-05 DIAGNOSIS — G894 Chronic pain syndrome: Secondary | ICD-10-CM | POA: Diagnosis not present

## 2018-10-05 DIAGNOSIS — I10 Essential (primary) hypertension: Secondary | ICD-10-CM | POA: Diagnosis not present

## 2018-10-05 DIAGNOSIS — E782 Mixed hyperlipidemia: Secondary | ICD-10-CM | POA: Diagnosis not present

## 2018-10-21 DIAGNOSIS — R69 Illness, unspecified: Secondary | ICD-10-CM | POA: Diagnosis not present

## 2018-10-22 ENCOUNTER — Other Ambulatory Visit: Payer: Self-pay | Admitting: "Endocrinology

## 2018-10-22 DIAGNOSIS — Z794 Long term (current) use of insulin: Secondary | ICD-10-CM | POA: Diagnosis not present

## 2018-10-22 DIAGNOSIS — E118 Type 2 diabetes mellitus with unspecified complications: Secondary | ICD-10-CM | POA: Diagnosis not present

## 2018-10-22 DIAGNOSIS — E1165 Type 2 diabetes mellitus with hyperglycemia: Secondary | ICD-10-CM | POA: Diagnosis not present

## 2018-10-22 DIAGNOSIS — E1121 Type 2 diabetes mellitus with diabetic nephropathy: Secondary | ICD-10-CM | POA: Diagnosis not present

## 2018-10-23 LAB — COMPREHENSIVE METABOLIC PANEL
ALT: 18 IU/L (ref 0–44)
AST: 15 IU/L (ref 0–40)
Albumin/Globulin Ratio: 1.8 (ref 1.2–2.2)
Albumin: 4.1 g/dL (ref 3.8–4.8)
Alkaline Phosphatase: 95 IU/L (ref 39–117)
BUN/Creatinine Ratio: 21 (ref 10–24)
BUN: 16 mg/dL (ref 8–27)
Bilirubin Total: 0.2 mg/dL (ref 0.0–1.2)
CO2: 23 mmol/L (ref 20–29)
Calcium: 9.2 mg/dL (ref 8.6–10.2)
Chloride: 104 mmol/L (ref 96–106)
Creatinine, Ser: 0.78 mg/dL (ref 0.76–1.27)
GFR calc Af Amer: 109 mL/min/{1.73_m2} (ref >59)
GFR calc non Af Amer: 94 mL/min/{1.73_m2} (ref >59)
Globulin, Total: 2.3 g/dL (ref 1.5–4.5)
Glucose: 150 mg/dL — ABNORMAL HIGH (ref 65–99)
Potassium: 5 mmol/L (ref 3.5–5.2)
Sodium: 138 mmol/L (ref 134–144)
Total Protein: 6.4 g/dL (ref 6.0–8.5)

## 2018-10-23 LAB — HGB A1C W/O EAG: Hgb A1c MFr Bld: 7.3 % — ABNORMAL HIGH (ref 4.8–5.6)

## 2018-10-29 ENCOUNTER — Other Ambulatory Visit: Payer: Self-pay

## 2018-10-29 ENCOUNTER — Encounter: Payer: Self-pay | Admitting: "Endocrinology

## 2018-10-29 ENCOUNTER — Ambulatory Visit (INDEPENDENT_AMBULATORY_CARE_PROVIDER_SITE_OTHER): Payer: Medicare HMO | Admitting: "Endocrinology

## 2018-10-29 DIAGNOSIS — E1165 Type 2 diabetes mellitus with hyperglycemia: Secondary | ICD-10-CM | POA: Diagnosis not present

## 2018-10-29 DIAGNOSIS — I10 Essential (primary) hypertension: Secondary | ICD-10-CM

## 2018-10-29 DIAGNOSIS — E782 Mixed hyperlipidemia: Secondary | ICD-10-CM | POA: Diagnosis not present

## 2018-10-29 DIAGNOSIS — Z794 Long term (current) use of insulin: Secondary | ICD-10-CM | POA: Diagnosis not present

## 2018-10-29 DIAGNOSIS — E118 Type 2 diabetes mellitus with unspecified complications: Secondary | ICD-10-CM

## 2018-10-29 DIAGNOSIS — IMO0002 Reserved for concepts with insufficient information to code with codable children: Secondary | ICD-10-CM

## 2018-10-29 NOTE — Progress Notes (Signed)
10/29/2018                                                    Endocrinology Telehealth Visit Follow up Note -During COVID -19 Pandemic  This visit type was conducted due to national recommendations for restrictions regarding the COVID-19 Pandemic  in an effort to limit this patient's exposure and mitigate transmission of the corona virus.  Due to his co-morbid illnesses, Xavier Caldwell is at  moderate to high risk for complications without adequate follow up.  This format is felt to be most appropriate for him at this time.  I connected with this patient on 10/29/2018   by telephone and verified that I am speaking with the correct person using two identifiers. Xavier Caldwell, 09/13/1950. he has verbally consented to this visit. All issues noted in this document were discussed and addressed. The format was not optimal for physical exam.    Subjective:    Patient ID: Xavier Caldwell, male    DOB: July 17, 1950,    Past Medical History:  Diagnosis Date  . Acute systolic (congestive) heart failure ([place])   . Anxiety   . Arthritis   . Atrial fibrillation ([place])   . Back pain   . Coronary artery disease   . Diabetes mellitus without complication ([place])   . High cholesterol   . History of gout   . Hypertension   . Myocardial infarction ([place]) 04/2018  . Neuropathy   . 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 Sleep apnea    Stop Bang score of 4  . STEMI (ST elevation myocardial infarction) ([place])    04/08/2018 PCI/DES RCA  . VT (ventricular tachycardia) ([place])    Past Surgical History:  Procedure Laterality Date  . APPENDECTOMY    . CARDIOVERSION N/A 04/13/2018   Procedure: CARDIOVERSION;  Surgeon: Skeet Latch, MD;  Location: [facility];  Service: Cardiovascular;  Laterality: N/A;  . CATARACT EXTRACTION W/PHACO Right 04/25/2014   Procedure: CATARACT EXTRACTION PHACO AND INTRAOCULAR LENS PLACEMENT ([place]);  Surgeon: Williams Che, MD;  Location: [facility];  Service:  Ophthalmology;  Laterality: Right;  CDE:4.70  . CATARACT EXTRACTION W/PHACO Left 07/11/2014   Procedure: CATARACT EXTRACTION PHACO AND INTRAOCULAR LENS PLACEMENT LEFT EYE CDE=8.32;  Surgeon: Williams Che, MD;  Location: [facility];  Service: Ophthalmology;  Laterality: Left;  . COLONOSCOPY WITH PROPOFOL N/A 09/23/2016   Procedure: COLONOSCOPY WITH PROPOFOL;  Surgeon: Daneil Dolin, MD;  [facility];  Service: Endoscopy;  Laterality: N/A;  . CORONARY/GRAFT ACUTE MI REVASCULARIZATION N/A 04/07/2018   Procedure: Coronary/Graft Acute MI Revascularization;  Surgeon: Burnell Blanks, MD;  Location: [facility];  Service: Cardiovascular;  Laterality: N/A;  . LEFT HEART CATH AND CORONARY ANGIOGRAPHY N/A 04/07/2018   Procedure: LEFT HEART CATH AND CORONARY ANGIOGRAPHY;  Surgeon: Burnell Blanks, MD;  Location: [facility];  Service: Cardiovascular;  Laterality: N/A;  . POLYPECTOMY  09/23/2016   Procedure: POLYPECTOMY;  Surgeon: Daneil Dolin, MD;  [facility];  Service: Endoscopy;;  colon  . RETINAL DETACHMENT SURGERY     Social History   Socioeconomic History  . Marital status: Married    Spouse name: Not on file  . Number of children: Not on file  .  Years of education: Not on file  . Highest education level: Not on file  Occupational History  . Not on file  Social Needs  . Financial resource strain: Not on file  . Food insecurity    Worry: Not on file    Inability: Not on file  . Transportation needs    Medical: Not on file    Non-medical: Not on file  Tobacco Use  . Smoking status: Former Smoker    Packs/day: 1.50    Years: 30.00    Pack years: 45.00    Types: Cigarettes    Quit date: 07/05/2012    Years since quitting: 6.3  . Smokeless tobacco: Never Used  Substance and Sexual Activity  . Alcohol use: No  . Drug use: No  . Sexual activity: Not Currently    Birth control/protection: None  Lifestyle  . Physical activity    Days  per week: Not on file    Minutes per session: Not on file  . Stress: Not on file  Relationships  . Social Herbalist on phone: Not on file    Gets together: Not on file    Attends religious service: Not on file    Active member of club or organization: Not on file    Attends meetings of clubs or organizations: Not on file    Relationship status: Not on file  Other Topics Concern  . Not on file  Social History Narrative  . Not on file   Outpatient Encounter Medications as of 10/29/2018  Medication Sig  . ALPRAZolam (XANAX) 0.5 MG tablet Take 0.5 mg by mouth 2 (two) times daily as needed for anxiety.  Marland Kitchen apixaban (ELIQUIS) 5 MG TABS tablet Take 1 tablet (5 mg total) by mouth 2 (two) times daily.  Marland Kitchen atorvastatin (LIPITOR) 40 MG tablet TAKE 1 TABLET BY MOUTH ONCE DAILY 6 IN THE EVENING  . B Complex-C (SUPER B COMPLEX PO) Take 1 tablet by mouth daily.   . carvedilol (COREG) 3.125 MG tablet Take 1 tablet (3.125 mg total) by mouth 2 (two) times daily.  . clopidogrel (PLAVIX) 75 MG tablet Take 1 tablet (75 mg total) by mouth daily.  . DULoxetine (CYMBALTA) 60 MG capsule Take 60 mg by mouth daily with lunch.   . ergocalciferol (VITAMIN D2) 50000 units capsule Take 50,000 Units by mouth every Tuesday.   Marland Kitchen glucose blood (ONETOUCH ULTRA) test strip USE ONE TEST STRIP AS DIRECTED TWICE DAILY  . HYDROcodone-acetaminophen (NORCO/VICODIN) 5-325 MG per tablet Take 1 tablet by mouth every 6 (six) hours as needed for moderate pain.  . Insulin Glargine (BASAGLAR KWIKPEN) 100 UNIT/ML SOPN INJECT 50 UNITS SUBCUTANEOUSLY AT BEDTIME  . lisinopril (PRINIVIL,ZESTRIL) 20 MG tablet Take 1 tablet (20 mg total) by mouth daily.  . metFORMIN (GLUCOPHAGE) 1000 MG tablet Take 1 tablet by mouth twice daily  . nitroGLYCERIN (NITROSTAT) 0.4 MG SL tablet Place 1 tablet (0.4 mg total) under the tongue every 5 (five) minutes as needed.  Marland Kitchen rOPINIRole (REQUIP) 1 MG tablet Take 1 mg by mouth 3 (three) times daily.  .  timolol (TIMOPTIC) 0.5 % ophthalmic solution Place 1 drop into both eyes 2 (two) times daily.   Marland Kitchen UNIFINE PENTIPS 31G X 5 MM MISC USE TO INJECT INSULIN AT BEDTIME (Patient taking differently: at bedtime. )   No facility-administered encounter medications on file as of 10/29/2018.    ALLERGIES: No Known Allergies VACCINATION STATUS:  There is no immunization history on file  for this patient.  Diabetes He presents for his follow-up (Telephone visit) diabetic visit. He has type 2 diabetes mellitus. His disease course has been worsening. There are no hypoglycemic associated symptoms. Pertinent negatives for hypoglycemia include no confusion, headaches, pallor or seizures. Pertinent negatives for diabetes include no chest pain, no fatigue, no polydipsia, no polyphagia, no polyuria and no weakness. There are no hypoglycemic complications. Symptoms are worsening. Diabetic complications include heart disease. Risk factors for coronary artery disease include diabetes mellitus, hypertension and tobacco exposure. Current diabetic treatment includes insulin injections and oral agent (monotherapy). He is compliant with treatment most of the time. He is following a generally unhealthy diet. When asked about meal planning, he reported none. He has had a previous visit with a dietitian. He participates in exercise intermittently. There is no change in his home blood glucose trend. His breakfast blood glucose range is generally 130-140 mg/dl. His bedtime blood glucose range is generally 140-180 mg/dl. His overall blood glucose range is 140-180 mg/dl. An ACE inhibitor/angiotensin II receptor blocker is being taken.  Hyperlipidemia This is a chronic problem. The current episode started more than 1 year ago. The problem is controlled. Exacerbating diseases include diabetes. Pertinent negatives include no chest pain, myalgias or shortness of breath. Current antihyperlipidemic treatment includes statins. Risk factors for  coronary artery disease include dyslipidemia, diabetes mellitus, hypertension, male sex and a sedentary lifestyle.  Hypertension This is a chronic problem. The current episode started more than 1 year ago. Pertinent negatives include no chest pain, headaches, neck pain, palpitations or shortness of breath. Risk factors for coronary artery disease include diabetes mellitus, dyslipidemia, male gender, sedentary lifestyle and smoking/tobacco exposure.    Objective:    There were no vitals taken for this visit.  Wt Readings from Last 3 Encounters:  07/28/18 220 lb (99.8 kg)  04/21/18 216 lb 3.2 oz (98.1 kg)  04/14/18 211 lb 1.6 oz (95.8 kg)       Results for orders placed or performed in visit on 10/22/18  Comprehensive metabolic panel  Result Value Ref Range   Glucose 150 (H) 65 - 99 mg/dL   BUN 16 8 - 27 mg/dL   Creatinine, Ser 0.78 0.76 - 1.27 mg/dL   GFR calc non Af Amer 94 >59 mL/min/1.73   GFR calc Af Amer 109 >59 mL/min/1.73   BUN/Creatinine Ratio 21 10 - 24   Sodium 138 134 - 144 mmol/L   Potassium 5.0 3.5 - 5.2 mmol/L   Chloride 104 96 - 106 mmol/L   CO2 23 20 - 29 mmol/L   Calcium 9.2 8.6 - 10.2 mg/dL   Total Protein 6.4 6.0 - 8.5 g/dL   Albumin 4.1 3.8 - 4.8 g/dL   Globulin, Total 2.3 1.5 - 4.5 g/dL   Albumin/Globulin Ratio 1.8 1.2 - 2.2   Bilirubin Total 0.2 0.0 - 1.2 mg/dL   Alkaline Phosphatase 95 39 - 117 IU/L   AST 15 0 - 40 IU/L   ALT 18 0 - 44 IU/L  Hgb A1c w/o eAG  Result Value Ref Range   Hgb A1c MFr Bld 7.3 (H) 4.8 - 5.6 %   Diabetic Labs (most recent): Lab Results  Component Value Date   HGBA1C 7.3 (H) 10/22/2018   HGBA1C 7.7 (H) 07/22/2018   HGBA1C 6.9 (H) 04/17/2018   Lipid Panel     Component Value Date/Time   CHOL 107 04/17/2018 0829   TRIG 92 04/17/2018 0829   HDL 32 (L) 04/17/2018 [id number]  CHOLHDL 4.4 04/07/2018 1349   VLDL 18 04/07/2018 1349   LDLCALC 57 04/17/2018 0829    Assessment & Plan:   1. Uncontrolled type 2 diabetes  mellitus complicated by coronary artery disease.     -He reports near target fasting glycemic profile between 96- 145 and A1c of 7.3%. I discussed his recent labs with him.   -He remains at extremely  high risk for more  acute and chronic complications of diabetes which include CAD, CVA, CKD, retinopathy, and neuropathy. These are all discussed in detail with the patient.  -Based on his treatment response, he will not need prandial insulin for now, advised to continue Basaglar 50 units nightly, associated with monitoring of blood glucose twice a day-daily before breakfast and at bedtime.    -He is encouraged to call clinic if blood glucose readings are below 70 or above 2003. -He is advised to continue metformin 1000 mg p.o. twice daily--daily with breakfast and supper.   2) BP/HTN:   He is advised to continue his current blood pressure medications including lisinopril 40 mg p.o. daily.   3) Lipids/HPL: His recent lipid panel showed controlled LDL at 57.  He is advised to continue simvastatin 20 mg p.o. nightly.     - Patient Care Time Today:  25 min, of which >50% was spent in  counseling and the rest reviewing his  current and  previous labs/studies, previous treatments, his blood glucose readings, and medications' doses and developing a plan for long-term care based on the latest recommendations for standards of care.   Xavier Caldwell participated in the discussions, expressed understanding, and voiced agreement with the above plans.  All questions were answered to his satisfaction. he is encouraged to contact clinic should he have any questions or concerns prior to his return visit.  Follow up plan: Return in about 4 months (around 02/28/2019) for Bring Meter and Logs- A1c in Office, Include 8 log sheets.  Glade Lloyd, MD Phone: [phone]  Fax: [phone]  This note was partially dictated with voice recognition software. Similar sounding words can be transcribed inadequately or may  not  be corrected upon review.  10/29/2018, 8:48 AM

## 2018-11-04 DIAGNOSIS — N181 Chronic kidney disease, stage 1: Secondary | ICD-10-CM | POA: Diagnosis not present

## 2018-11-04 DIAGNOSIS — E7849 Other hyperlipidemia: Secondary | ICD-10-CM | POA: Diagnosis not present

## 2018-11-04 DIAGNOSIS — I10 Essential (primary) hypertension: Secondary | ICD-10-CM | POA: Diagnosis not present

## 2018-11-04 DIAGNOSIS — E1121 Type 2 diabetes mellitus with diabetic nephropathy: Secondary | ICD-10-CM | POA: Diagnosis not present

## 2018-11-09 DIAGNOSIS — Z23 Encounter for immunization: Secondary | ICD-10-CM | POA: Diagnosis not present

## 2018-11-24 ENCOUNTER — Telehealth: Payer: Self-pay | Admitting: Cardiology

## 2018-11-24 NOTE — Telephone Encounter (Signed)
Sharyn Lull @ [facility] left message wanting to check on this pt's application for pt assistance   Please call her @ [phone]

## 2018-11-30 NOTE — Telephone Encounter (Signed)
Notified pt that application has not been signed. Pt will come sign tomorrow.

## 2018-12-01 ENCOUNTER — Other Ambulatory Visit: Payer: Self-pay | Admitting: Cardiology

## 2018-12-04 ENCOUNTER — Telehealth: Payer: Self-pay

## 2018-12-04 NOTE — Telephone Encounter (Signed)
Application case number [id number]   Approved for free eliquis from 12/03/2018 through 02/02/2019

## 2018-12-05 DIAGNOSIS — N181 Chronic kidney disease, stage 1: Secondary | ICD-10-CM | POA: Diagnosis not present

## 2018-12-05 DIAGNOSIS — I130 Hypertensive heart and chronic kidney disease with heart failure and stage 1 through stage 4 chronic kidney disease, or unspecified chronic kidney disease: Secondary | ICD-10-CM | POA: Diagnosis not present

## 2018-12-05 DIAGNOSIS — E1122 Type 2 diabetes mellitus with diabetic chronic kidney disease: Secondary | ICD-10-CM | POA: Diagnosis not present

## 2018-12-05 DIAGNOSIS — I5023 Acute on chronic systolic (congestive) heart failure: Secondary | ICD-10-CM | POA: Diagnosis not present

## 2018-12-07 ENCOUNTER — Telehealth: Payer: Self-pay | Admitting: *Deleted

## 2018-12-07 NOTE — Telephone Encounter (Signed)
Pt and wife notified that pt has been approved for Eliquis assistance and medication is in office.

## 2018-12-08 DIAGNOSIS — R69 Illness, unspecified: Secondary | ICD-10-CM | POA: Diagnosis not present

## 2018-12-17 ENCOUNTER — Other Ambulatory Visit: Payer: Self-pay | Admitting: "Endocrinology

## 2019-01-04 DIAGNOSIS — E1122 Type 2 diabetes mellitus with diabetic chronic kidney disease: Secondary | ICD-10-CM | POA: Diagnosis not present

## 2019-01-04 DIAGNOSIS — N181 Chronic kidney disease, stage 1: Secondary | ICD-10-CM | POA: Diagnosis not present

## 2019-01-04 DIAGNOSIS — I5023 Acute on chronic systolic (congestive) heart failure: Secondary | ICD-10-CM | POA: Diagnosis not present

## 2019-01-04 DIAGNOSIS — I130 Hypertensive heart and chronic kidney disease with heart failure and stage 1 through stage 4 chronic kidney disease, or unspecified chronic kidney disease: Secondary | ICD-10-CM | POA: Diagnosis not present

## 2019-01-06 DIAGNOSIS — R69 Illness, unspecified: Secondary | ICD-10-CM | POA: Diagnosis not present

## 2019-01-06 DIAGNOSIS — E6609 Other obesity due to excess calories: Secondary | ICD-10-CM | POA: Diagnosis not present

## 2019-01-06 DIAGNOSIS — Z6831 Body mass index (BMI) 31.0-31.9, adult: Secondary | ICD-10-CM | POA: Diagnosis not present

## 2019-01-06 DIAGNOSIS — G2581 Restless legs syndrome: Secondary | ICD-10-CM | POA: Diagnosis not present

## 2019-01-23 DIAGNOSIS — R69 Illness, unspecified: Secondary | ICD-10-CM | POA: Diagnosis not present

## 2019-02-10 DIAGNOSIS — E6609 Other obesity due to excess calories: Secondary | ICD-10-CM | POA: Diagnosis not present

## 2019-02-10 DIAGNOSIS — M722 Plantar fascial fibromatosis: Secondary | ICD-10-CM | POA: Diagnosis not present

## 2019-02-10 DIAGNOSIS — Z6831 Body mass index (BMI) 31.0-31.9, adult: Secondary | ICD-10-CM | POA: Diagnosis not present

## 2019-02-24 ENCOUNTER — Other Ambulatory Visit: Payer: Self-pay | Admitting: Cardiology

## 2019-03-04 ENCOUNTER — Other Ambulatory Visit: Payer: Self-pay

## 2019-03-04 ENCOUNTER — Encounter: Payer: Self-pay | Admitting: "Endocrinology

## 2019-03-04 ENCOUNTER — Ambulatory Visit: Payer: Medicare HMO | Admitting: "Endocrinology

## 2019-03-04 VITALS — BP 181/98 | HR 71 | Ht 71.0 in | Wt 219.8 lb

## 2019-03-04 DIAGNOSIS — E118 Type 2 diabetes mellitus with unspecified complications: Secondary | ICD-10-CM | POA: Diagnosis not present

## 2019-03-04 DIAGNOSIS — Z794 Long term (current) use of insulin: Secondary | ICD-10-CM

## 2019-03-04 DIAGNOSIS — E782 Mixed hyperlipidemia: Secondary | ICD-10-CM | POA: Diagnosis not present

## 2019-03-04 DIAGNOSIS — I10 Essential (primary) hypertension: Secondary | ICD-10-CM | POA: Diagnosis not present

## 2019-03-04 DIAGNOSIS — E1165 Type 2 diabetes mellitus with hyperglycemia: Secondary | ICD-10-CM | POA: Diagnosis not present

## 2019-03-04 DIAGNOSIS — E1122 Type 2 diabetes mellitus with diabetic chronic kidney disease: Secondary | ICD-10-CM | POA: Diagnosis not present

## 2019-03-04 DIAGNOSIS — IMO0002 Reserved for concepts with insufficient information to code with codable children: Secondary | ICD-10-CM

## 2019-03-04 LAB — POCT GLYCOSYLATED HEMOGLOBIN (HGB A1C): Hemoglobin A1C: 7.5 % — AB (ref 4.0–5.6)

## 2019-03-04 MED ORDER — BASAGLAR KWIKPEN 100 UNIT/ML ~~LOC~~ SOPN: 60.0000 [IU] | PEN_INJECTOR | Freq: Every day | SUBCUTANEOUS | 0 refills | Status: DC

## 2019-03-04 NOTE — Progress Notes (Signed)
03/04/2019    Endocrinology follow-up note   Subjective:    Patient ID: Xavier Caldwell, male    DOB: 11-Aug-1950,    Past Medical History:  Diagnosis Date  . Acute systolic (congestive) heart failure ([place])   . Anxiety   . Arthritis   . Atrial fibrillation ([place])   . Back pain   . Coronary artery disease   . Diabetes mellitus without complication ([place])   . High cholesterol   . History of gout   . Hypertension   . Myocardial infarction ([place]) 04/2018  . Neuropathy   . 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 Sleep apnea    Stop Bang score of 4  . STEMI (ST elevation myocardial infarction) ([place])    04/08/2018 PCI/DES RCA  . VT (ventricular tachycardia) ([place])    Past Surgical History:  Procedure Laterality Date  . APPENDECTOMY    . CARDIOVERSION N/A 04/13/2018   Procedure: CARDIOVERSION;  Surgeon: Skeet Latch, MD;  Location: [facility];  Service: Cardiovascular;  Laterality: N/A;  . CATARACT EXTRACTION W/PHACO Right 04/25/2014   Procedure: CATARACT EXTRACTION PHACO AND INTRAOCULAR LENS PLACEMENT ([place]);  Surgeon: Williams Che, MD;  Location[facility];  Service: Ophthalmology;  Laterality: Right;  CDE:4.70  . CATARACT EXTRACTION W/PHACO Left 07/11/2014   Procedure: CATARACT EXTRACTION PHACO AND INTRAOCULAR LENS PLACEMENT LEFT EYE CDE=8.32;  Surgeon: Williams Che, MD;  [facility];  Service: Ophthalmology;  Laterality: Left;  . COLONOSCOPY WITH PROPOFOL N/A 09/23/2016   Procedure: COLONOSCOPY WITH PROPOFOL;  Surgeon: Daneil Dolin, MD;  [facility];  Service: Endoscopy;  Laterality: N/A;  . CORONARY/GRAFT ACUTE MI REVASCULARIZATION N/A 04/07/2018   Procedure: Coronary/Graft Acute MI Revascularization;  Surgeon: Burnell Blanks, MD;  Location: [facility];  Service: Cardiovascular;  Laterality: N/A;  . LEFT HEART CATH AND CORONARY ANGIOGRAPHY N/A 04/07/2018   Procedure: LEFT HEART CATH AND CORONARY ANGIOGRAPHY;  Surgeon:  Burnell Blanks, MD;  Location: [facility];  Service: Cardiovascular;  Laterality: N/A;  . POLYPECTOMY  09/23/2016   Procedure: POLYPECTOMY;  Surgeon: Daneil Dolin, MD;  [facility];  Service: Endoscopy;;  colon  . RETINAL DETACHMENT SURGERY     Social History   Socioeconomic History  . Marital status: Married    Spouse name: Not on file  . Number of children: Not on file  . Years of education: Not on file  . Highest education level: Not on file  Occupational History  . Not on file  Tobacco Use  . Smoking status: Former Smoker    Packs/day: 1.50    Years: 30.00    Pack years: 45.00    Types: Cigarettes    Quit date: 07/05/2012    Years since quitting: 6.6  . Smokeless tobacco: Never Used  Substance and Sexual Activity  . Alcohol use: No  . Drug use: No  . Sexual activity: Not Currently    Birth control/protection: None  Other Topics Concern  . Not on file  Social History Narrative  . Not on file   Social Determinants of Health   Financial Resource Strain:   . Difficulty of Paying Living Expenses: Not on file  Food Insecurity:   . Worried About Charity fundraiser in the Last Year: Not on file  . Ran Out of Food in the Last Year: Not on file  Transportation Needs:   . Lack  of Transportation (Medical): Not on file  . Lack of Transportation (Non-Medical): Not on file  Physical Activity:   . Days of Exercise per Week: Not on file  . Minutes of Exercise per Session: Not on file  Stress:   . Feeling of Stress : Not on file  Social Connections:   . Frequency of Communication with Friends and Family: Not on file  . Frequency of Social Gatherings with Friends and Family: Not on file  . Attends Religious Services: Not on file  . Active Member of Clubs or Organizations: Not on file  . Attends Archivist Meetings: Not on file  . Marital Status: Not on file   Outpatient Encounter Medications as of 03/04/2019  Medication Sig  .  ALPRAZolam (XANAX) 0.5 MG tablet Take 0.5 mg by mouth 2 (two) times daily as needed for anxiety.  Marland Kitchen atorvastatin (LIPITOR) 40 MG tablet TAKE 1 TABLET BY MOUTH ONCE DAILY 6 IN THE EVENING  . B Complex-C (SUPER B COMPLEX PO) Take 1 tablet by mouth daily.   . carvedilol (COREG) 3.125 MG tablet Take 1 tablet (3.125 mg total) by mouth 2 (two) times daily.  . clopidogrel (PLAVIX) 75 MG tablet TAKE 1 TABLET BY MOUTH EVERY DAY  . DULoxetine (CYMBALTA) 60 MG capsule Take 60 mg by mouth daily with lunch.   Marland Kitchen ELIQUIS 5 MG TABS tablet TAKE 1 TABLET BY MOUTH TWICE DAILY  . ergocalciferol (VITAMIN D2) 50000 units capsule Take 50,000 Units by mouth every Tuesday.   Marland Kitchen glucose blood (ONETOUCH ULTRA) test strip USE ONE TEST STRIP AS DIRECTED TWICE DAILY  . HYDROcodone-acetaminophen (NORCO/VICODIN) 5-325 MG per tablet Take 1 tablet by mouth every 6 (six) hours as needed for moderate pain.  . Insulin Glargine (BASAGLAR KWIKPEN) 100 UNIT/ML SOPN Inject 0.6 mLs (60 Units total) into the skin at bedtime.  Marland Kitchen lisinopril (PRINIVIL,ZESTRIL) 20 MG tablet Take 1 tablet (20 mg total) by mouth daily.  . metFORMIN (GLUCOPHAGE) 1000 MG tablet TAKE 1 TABLET BY MOUTH TWICE DAILY  . nitroGLYCERIN (NITROSTAT) 0.4 MG SL tablet Place 1 tablet (0.4 mg total) under the tongue every 5 (five) minutes as needed.  Marland Kitchen rOPINIRole (REQUIP) 1 MG tablet Take 1 mg by mouth 3 (three) times daily.  . timolol (TIMOPTIC) 0.5 % ophthalmic solution Place 1 drop into both eyes 2 (two) times daily.   Marland Kitchen UNIFINE PENTIPS 31G X 5 MM MISC USE TO INJECT INSULIN AT BEDTIME (Patient taking differently: at bedtime. )  . [DISCONTINUED] Insulin Glargine (BASAGLAR KWIKPEN) 100 UNIT/ML SOPN INJECT 50 UNITS SUBCUTANEOUSLY AT BEDTIME   No facility-administered encounter medications on file as of 03/04/2019.   ALLERGIES: No Known Allergies VACCINATION STATUS:  There is no immunization history on file for this patient.  Diabetes He presents for his follow-up  (Telephone visit) diabetic visit. He has type 2 diabetes mellitus. His disease course has been worsening. There are no hypoglycemic associated symptoms. Pertinent negatives for hypoglycemia include no confusion, headaches, pallor or seizures. Pertinent negatives for diabetes include no chest pain, no fatigue, no polydipsia, no polyphagia, no polyuria and no weakness. There are no hypoglycemic complications. Symptoms are improving. Diabetic complications include heart disease. Risk factors for coronary artery disease include diabetes mellitus, hypertension and tobacco exposure. Current diabetic treatment includes insulin injections and oral agent (monotherapy). He is compliant with treatment most of the time. His weight is fluctuating minimally. He is following a generally unhealthy diet. When asked about meal planning, he reported none. He has  had a previous visit with a dietitian. He participates in exercise intermittently. There is no change in his home blood glucose trend. His breakfast blood glucose range is generally 130-140 mg/dl. His bedtime blood glucose range is generally 140-180 mg/dl. His overall blood glucose range is 140-180 mg/dl. An ACE inhibitor/angiotensin II receptor blocker is being taken.  Hyperlipidemia This is a chronic problem. The current episode started more than 1 year ago. The problem is controlled. Exacerbating diseases include diabetes. Pertinent negatives include no chest pain, myalgias or shortness of breath. Current antihyperlipidemic treatment includes statins. Risk factors for coronary artery disease include dyslipidemia, diabetes mellitus, hypertension, male sex and a sedentary lifestyle.  Hypertension This is a chronic problem. The current episode started more than 1 year ago. Pertinent negatives include no chest pain, headaches, neck pain, palpitations or shortness of breath. Risk factors for coronary artery disease include diabetes mellitus, dyslipidemia, male gender,  sedentary lifestyle and smoking/tobacco exposure.   Constitutional:+ Fluctuating body weight, no fatigue, no subjective hyperthermia, no subjective hypothermia Eyes: no blurry vision, no xerophthalmia ENT: no sore throat, no nodules palpated in throat, no dysphagia/odynophagia, no hoarseness Cardiovascular: no Chest Pain, no Shortness of Breath, no palpitations, no leg swelling Respiratory: no cough, no SOB Gastrointestinal: no Nausea/Vomiting/Diarhhea Musculoskeletal: + Disequilibrium, walks with a cane, no muscle/joint aches Skin: no rashes Neurological: no tremors, no numbness, no tingling, no dizziness Psychiatric: no depression, no anxiety    Objective:    BP (!) 181/98   Pulse 71   Ht 5\' 11"  (1.803 m)   Wt 219 lb 12.8 oz (99.7 kg)   BMI 30.66 kg/m   Wt Readings from Last 3 Encounters:  03/04/19 219 lb 12.8 oz (99.7 kg)  07/28/18 220 lb (99.8 kg)  04/21/18 216 lb 3.2 oz (98.1 kg)     Physical Exam- Limited  Constitutional:  Body mass index is 30.66 kg/m. , not in acute distress, normal state of mind Eyes:  EOMI, no exophthalmos Neck: Supple Respiratory: Adequate breathing efforts Musculoskeletal: no gross deformities, strength intact in all four extremities, no gross restriction of joint movements, + disequilibrium, walks with a cane. Skin:  no rashes, no hyperemia Neurological: no tremor with outstretched hands.    Results for orders placed or performed in visit on 03/04/19  HgB A1c  Result Value Ref Range   Hemoglobin A1C 7.5 (A) 4.0 - 5.6 %   HbA1c POC (<> result, manual entry)     HbA1c, POC (prediabetic range)     HbA1c, POC (controlled diabetic range)     Diabetic Labs (most recent): Lab Results  Component Value Date   HGBA1C 7.5 (A) 03/04/2019   HGBA1C 7.3 (H) 10/22/2018   HGBA1C 7.7 (H) 07/22/2018   Lipid Panel     Component Value Date/Time   CHOL 107 04/17/2018 0829   TRIG 92 04/17/2018 0829   HDL 32 (L) 04/17/2018 0829   CHOLHDL 4.4  04/07/2018 1349   VLDL 18 04/07/2018 1349   LDLCALC 57 04/17/2018 0829    Assessment & Plan:   1. Uncontrolled type 2 diabetes mellitus complicated by coronary artery disease.     -He presents with controlled fasting glycemic profile, slightly above target postprandial blood glucose profile.  His point-of-care A1c 7.5%, slightly increasing.   I discussed his recent labs with him.   -He remains at extremely  high risk for more  acute and chronic complications of diabetes which include CAD, CVA, CKD, retinopathy, and neuropathy. These are all discussed in detail  with the patient.  -Based on his treatment response, he will not need prandial insulin for now, advised to increase Basaglar to 60 units nightly, associated with monitoring of blood glucose twice a day-daily before breakfast and at bedtime.    -He is encouraged to call clinic if blood glucose readings are below 70 or above 2003. -He is advised to continue metformin 1000 mg p.o. twice daily--daily with breakfast and supper.   2) BP/HTN: His blood pressure is not controlled to target this morning.  He is advised to continue his current blood pressure medications including lisinopril 40 mg p.o. daily.  He also has carvedilol 3.125 mg p.o. twice daily.  3) Lipids/HPL: His recent lipid panel showed controlled LDL at 57.  He is advised to continue simvastatin 20 mg p.o. nightly.   He is advised to maintain close follow-up with his PMD, Dr. Gerarda Fraction.  - Time spent on this patient care encounter:  35 min, of which > 50% was spent in  counseling and the rest reviewing his blood glucose logs , discussing his hypoglycemia and hyperglycemia episodes, reviewing his current and  previous labs / studies  ( including abstraction from other facilities) and medications  doses and developing a  long term treatment plan and documenting his care.   Please refer to Patient Instructions for Blood Glucose Monitoring and Insulin/Medications Dosing Guide"  in  media tab for additional information. Please  also refer to " Patient Self Inventory" in the Media  tab for reviewed elements of pertinent patient history.  Xavier Caldwell participated in the discussions, expressed understanding, and voiced agreement with the above plans.  All questions were answered to his satisfaction. he is encouraged to contact clinic should he have any questions or concerns prior to his return visit.   Follow up plan: Return in about 4 months (around 07/02/2019) for Bring Meter and Logs- A1c in Office, Follow up with Pre-visit Labs.  Glade Lloyd, MD Phone: [phone]  Fax: [phone]  This note was partially dictated with voice recognition software. Similar sounding words can be transcribed inadequately or may not  be corrected upon review.  03/04/2019, 1:14 PM

## 2019-03-04 NOTE — Patient Instructions (Signed)

## 2019-03-07 DIAGNOSIS — N181 Chronic kidney disease, stage 1: Secondary | ICD-10-CM | POA: Diagnosis not present

## 2019-03-07 DIAGNOSIS — Z008 Encounter for other general examination: Secondary | ICD-10-CM | POA: Diagnosis not present

## 2019-03-07 DIAGNOSIS — R69 Illness, unspecified: Secondary | ICD-10-CM | POA: Diagnosis not present

## 2019-03-07 DIAGNOSIS — E1151 Type 2 diabetes mellitus with diabetic peripheral angiopathy without gangrene: Secondary | ICD-10-CM | POA: Diagnosis not present

## 2019-03-07 DIAGNOSIS — I5023 Acute on chronic systolic (congestive) heart failure: Secondary | ICD-10-CM | POA: Diagnosis not present

## 2019-03-07 DIAGNOSIS — I25119 Atherosclerotic heart disease of native coronary artery with unspecified angina pectoris: Secondary | ICD-10-CM | POA: Diagnosis not present

## 2019-03-07 DIAGNOSIS — Z794 Long term (current) use of insulin: Secondary | ICD-10-CM | POA: Diagnosis not present

## 2019-03-07 DIAGNOSIS — E1122 Type 2 diabetes mellitus with diabetic chronic kidney disease: Secondary | ICD-10-CM | POA: Diagnosis not present

## 2019-03-07 DIAGNOSIS — G20 Parkinson's disease: Secondary | ICD-10-CM | POA: Diagnosis not present

## 2019-03-07 DIAGNOSIS — E1142 Type 2 diabetes mellitus with diabetic polyneuropathy: Secondary | ICD-10-CM | POA: Diagnosis not present

## 2019-03-07 DIAGNOSIS — D6869 Other thrombophilia: Secondary | ICD-10-CM | POA: Diagnosis not present

## 2019-03-07 DIAGNOSIS — I4891 Unspecified atrial fibrillation: Secondary | ICD-10-CM | POA: Diagnosis not present

## 2019-03-07 DIAGNOSIS — I130 Hypertensive heart and chronic kidney disease with heart failure and stage 1 through stage 4 chronic kidney disease, or unspecified chronic kidney disease: Secondary | ICD-10-CM | POA: Diagnosis not present

## 2019-03-07 DIAGNOSIS — E785 Hyperlipidemia, unspecified: Secondary | ICD-10-CM | POA: Diagnosis not present

## 2019-03-09 ENCOUNTER — Other Ambulatory Visit: Payer: Self-pay

## 2019-03-09 ENCOUNTER — Ambulatory Visit: Payer: Medicare HMO | Admitting: Cardiology

## 2019-03-09 ENCOUNTER — Encounter: Payer: Self-pay | Admitting: Cardiology

## 2019-03-09 VITALS — BP 171/84 | HR 94 | Temp 99.4°F | Ht 71.0 in | Wt 219.0 lb

## 2019-03-09 DIAGNOSIS — R0989 Other specified symptoms and signs involving the circulatory and respiratory systems: Secondary | ICD-10-CM

## 2019-03-09 DIAGNOSIS — I10 Essential (primary) hypertension: Secondary | ICD-10-CM | POA: Diagnosis not present

## 2019-03-09 DIAGNOSIS — I2510 Atherosclerotic heart disease of native coronary artery without angina pectoris: Secondary | ICD-10-CM

## 2019-03-09 MED ORDER — NITROGLYCERIN 0.4 MG SL SUBL: 0.4000 mg | SUBLINGUAL_TABLET | SUBLINGUAL | 2 refills | Status: DC | PRN

## 2019-03-09 NOTE — Patient Instructions (Signed)
Medication Instructions:  ON April 07, 2019 - STOP PLAVIX   Labwork: NONE  Testing/Procedures: Your physician has requested that you have a carotid duplex. This test is an ultrasound of the carotid arteries in your neck. It looks at blood flow through these arteries that supply the brain with blood. Allow one hour for this exam. There are no restrictions or special instructions.    Follow-Up: Your physician wants you to follow-up in: 6 MONTHS.  You will receive a reminder letter in the mail two months in advance. If you don't receive a letter, please call our office to schedule the follow-up appointment.   Any Other Special Instructions Will Be Listed Below (If Applicable).  PLEASE CALL ON Friday WITH UPDATED HOME BLOOD PRESSURES    If you need a refill on your cardiac medications before your next appointment, please call your pharmacy.

## 2019-03-09 NOTE — Progress Notes (Signed)
Clinical Summary Mr. Roerig is a 69 y.o.male seen today for follow up of the following medical problem.s   1. CAD - admit 04/2018 with inferior STEMI - received DES to RCA. Complicated VT arrest requiring defib in the cath lab and by bradycardia, required temp pacer. Beta blocker not started due to bradycardia Due to afib was on triple therapy ASA/plavix/DOAC - 04/2018 echo LVEF 45-50%,   - no recent chest pain. Some SOB with walking which is chronic - compliant with meds   2. Afib - new diagnosis during 04/2018 admission with STEMI - management complicated by bradycardia - was on admiodarone, s/p DCCV. Amiodarone was discontinued at outpatient f/u - started on eliquis    -denies any palpitations - no bleeding on eliquis - eliquis is expensive, he is working on assistance application and asks for letter to help   3. HTN - he is compliant with meds  4. Hyperlipemia - 04/2018 TC 107 TG 92 HDL 32 LDL 57 - compliant with statin - labs followed by endocirine   Past Medical History:  Diagnosis Date  . Acute systolic (congestive) heart failure ([place])   . Anxiety   . Arthritis   . Atrial fibrillation ([place])   . Back pain   . Coronary artery disease   . Diabetes mellitus without complication ([place])   . High cholesterol   . History of gout   . Hypertension   . Myocardial infarction ([place]) 04/2018  . Neuropathy   . 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 Sleep apnea    Stop Bang score of 4  . STEMI (ST elevation myocardial infarction) ([place])    04/08/2018 PCI/DES RCA  . VT (ventricular tachycardia) ([facility])      No Known Allergies   Current Outpatient Medications  Medication Sig Dispense Refill  . ALPRAZolam (XANAX) 0.5 MG tablet Take 0.5 mg by mouth 2 (two) times daily as needed for anxiety.    Marland Kitchen atorvastatin (LIPITOR) 40 MG tablet TAKE 1 TABLET BY MOUTH ONCE DAILY 6 IN THE EVENING 30 tablet 11  . B Complex-C (SUPER B COMPLEX PO) Take 1  tablet by mouth daily.     . carvedilol (COREG) 3.125 MG tablet Take 1 tablet (3.125 mg total) by mouth 2 (two) times daily. 60 tablet 11  . clopidogrel (PLAVIX) 75 MG tablet TAKE 1 TABLET BY MOUTH EVERY DAY 90 tablet 1  . DULoxetine (CYMBALTA) 60 MG capsule Take 60 mg by mouth daily with lunch.     Marland Kitchen ELIQUIS 5 MG TABS tablet TAKE 1 TABLET BY MOUTH TWICE DAILY 60 tablet 2  . ergocalciferol (VITAMIN D2) 50000 units capsule Take 50,000 Units by mouth every Tuesday.     Marland Kitchen glucose blood (ONETOUCH ULTRA) test strip USE ONE TEST STRIP AS DIRECTED TWICE DAILY 100 each 0  . HYDROcodone-acetaminophen (NORCO/VICODIN) 5-325 MG per tablet Take 1 tablet by mouth every 6 (six) hours as needed for moderate pain.    . Insulin Glargine (BASAGLAR KWIKPEN) 100 UNIT/ML SOPN Inject 0.6 mLs (60 Units total) into the skin at bedtime. 45 mL 0  . lisinopril (PRINIVIL,ZESTRIL) 20 MG tablet Take 1 tablet (20 mg total) by mouth daily. 30 tablet 1  . metFORMIN (GLUCOPHAGE) 1000 MG tablet TAKE 1 TABLET BY MOUTH TWICE DAILY 180 tablet 0  . nitroGLYCERIN (NITROSTAT) 0.4 MG SL tablet Place 1 tablet (0.4 mg total) under the tongue every 5 (five) minutes as needed.  25 tablet 2  . rOPINIRole (REQUIP) 1 MG tablet Take 1 mg by mouth 3 (three) times daily.    . timolol (TIMOPTIC) 0.5 % ophthalmic solution Place 1 drop into both eyes 2 (two) times daily.     Marland Kitchen UNIFINE PENTIPS 31G X 5 MM MISC USE TO INJECT INSULIN AT BEDTIME (Patient taking differently: at bedtime. ) 100 each 5   No current facility-administered medications for this visit.     Past Surgical History:  Procedure Laterality Date  . APPENDECTOMY    . CARDIOVERSION N/A 04/13/2018   Procedure: CARDIOVERSION;  Surgeon: Skeet Latch, MD;  Location: [facility];  Service: Cardiovascular;  Laterality: N/A;  . CATARACT EXTRACTION W/PHACO Right 04/25/2014   Procedure: CATARACT EXTRACTION PHACO AND INTRAOCULAR LENS PLACEMENT ([place]);  Surgeon: Williams Che, MD;  [facility];  Service: Ophthalmology;  Laterality: Right;  CDE:4.70  . CATARACT EXTRACTION W/PHACO Left 07/11/2014   Procedure: CATARACT EXTRACTION PHACO AND INTRAOCULAR LENS PLACEMENT LEFT EYE CDE=8.32;  Surgeon: Williams Che, MD;  [facility];  Service: Ophthalmology;  Laterality: Left;  . COLONOSCOPY WITH PROPOFOL N/A 09/23/2016   Procedure: COLONOSCOPY WITH PROPOFOL;  Surgeon: Daneil Dolin, MD;  [facility];  Service: Endoscopy;  Laterality: N/A;  . CORONARY/GRAFT ACUTE MI REVASCULARIZATION N/A 04/07/2018   Procedure: Coronary/Graft Acute MI Revascularization;  Surgeon: Burnell Blanks, MD;  Location: [facility];  Service: Cardiovascular;  Laterality: N/A;  . LEFT HEART CATH AND CORONARY ANGIOGRAPHY N/A 04/07/2018   Procedure: LEFT HEART CATH AND CORONARY ANGIOGRAPHY;  Surgeon: Burnell Blanks, MD;  Location: [facility];  Service: Cardiovascular;  Laterality: N/A;  . POLYPECTOMY  09/23/2016   Procedure: POLYPECTOMY;  Surgeon: Daneil Dolin, MD;  [facility];  Service: Endoscopy;;  colon  . RETINAL DETACHMENT SURGERY       No Known Allergies    Family History  Problem Relation Age of Onset  . Hypertension Father   . Colon cancer Neg Hx      Social History Mr. Corker reports that he quit smoking about 6 years ago. His smoking use included cigarettes. He has a 45.00 pack-year smoking history. He has never used smokeless tobacco. Mr. Brakeman reports no history of alcohol use.   Review of Systems CONSTITUTIONAL: No weight loss, fever, chills, weakness or fatigue.  HEENT: Eyes: No visual loss, blurred vision, double vision or yellow sclerae.No hearing loss, sneezing, congestion, runny nose or sore throat.  SKIN: No rash or itching.  CARDIOVASCULAR: per hpi RESPIRATORY: No shortness of breath, cough or sputum.  GASTROINTESTINAL: No anorexia, nausea, vomiting or diarrhea. No abdominal pain or blood.  GENITOURINARY: No burning on  urination, no polyuria NEUROLOGICAL: No headache, dizziness, syncope, paralysis, ataxia, numbness or tingling in the extremities. No change in bowel or bladder control.  MUSCULOSKELETAL: No muscle, back pain, joint pain or stiffness.  LYMPHATICS: No enlarged nodes. No history of splenectomy.  PSYCHIATRIC: No history of depression or anxiety.  ENDOCRINOLOGIC: No reports of sweating, cold or heat intolerance. No polyuria or polydipsia.  Marland Kitchen   Physical Examination Today's Vitals   03/09/19 1402  BP: (!) 171/84  Pulse: 94  Temp: 99.4 F (37.4 C)  SpO2: 98%  Weight: 219 lb (99.3 kg)  Height: 5\' 11"  (1.803 m)   Body mass index is 30.54 kg/m.  Gen: resting comfortably, no acute distress HEENT: no scleral icterus, pupils equal round and reactive, no palptable cervical adenopathy,  CV:  RRR, no m/r/g, no jvd. Bilateral carotid bruits Resp: Clear to auscultation bilaterally GI: abdomen is soft, non-tender, non-distended, normal bowel sounds, no hepatosplenomegaly MSK: extremities are warm, no edema.  Skin: warm, no rash Neuro:  no focal deficits Psych: appropriate affect   Diagnostic Studies Cath: 04/07/2018   Prox RCA lesion is 100% stenosed.  Ost LAD to Prox LAD lesion is 30% stenosed.  Mid LAD lesion is 40% stenosed.  Ost 2nd Mrg to 2nd Mrg lesion is 40% stenosed.  A drug-eluting stent was successfully placed using a STENT RESOLUTE ONYX 3.5X30.  Post intervention, there is a 0% residual stenosis.  1. 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 Ventricular fibrillation/cardiac arrest with successful resuscitation 3. Successful PTCA/DES x 1 proximal RCA.  4. Successful aspiration thrombectomy distal RCA 5. Mild to moderate non-obstructive disease in the Circumflex and LAD  Recommendations: Will admit to ICU. Echo in the am. Will continue Aggrastat for 18 hours. Will continue ASA/Brilinta and statin. Will not start a beta blocker today  given hypotension and bradycardia. Ace inh on hold with hypotension.  TTE: 04/08/2018  IMPRESSIONS   1. The left ventricle has mildly reduced systolic function, with an ejection fraction of 45-50%. The cavity size was normal. There is moderately increased left ventricular wall thickness. 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 Left ventricular diffuse hypokinesis. 2. The right ventricle has mildly reduced systolic function. The cavity was mildly enlarged. There is no increase in right ventricular wall thickness. 3. The mitral valve is degenerative. There is mild mitral annular calcification present. 4. The aortic valve was not well visualized. 5. The aortic root and ascending aorta are normal in size and structure. 6. The inferior vena cava was dilated in size with <50% respiratory variability. 7. Severe hypokinesis of the left ventricular basal to mid inferior and inferoseptal. 8. The interatrial septum was not well visualized.    Assessment and Plan  1. CAD - doing well without recent symptoms - can stop his plavix 04/07/2019, continue eliquis alone in setting of his afib  2. PAF - no recent symptoms. Continue current meds, we will provide a letter to help with his assistance application.   3. HTN - manual recheck 150/75, he reports a history of white coat HTN - he will call us later this week with home bp's  4. Carotid bruits - order carotid US  F/u 6 months    Arnoldo Lenis, M.D.

## 2019-03-10 DIAGNOSIS — R2689 Other abnormalities of gait and mobility: Secondary | ICD-10-CM | POA: Diagnosis not present

## 2019-03-10 DIAGNOSIS — M5002 Cervical disc disorder with myelopathy, mid-cervical region, unspecified level: Secondary | ICD-10-CM | POA: Diagnosis not present

## 2019-03-10 DIAGNOSIS — M461 Sacroiliitis, not elsewhere classified: Secondary | ICD-10-CM | POA: Diagnosis not present

## 2019-03-10 DIAGNOSIS — E1140 Type 2 diabetes mellitus with diabetic neuropathy, unspecified: Secondary | ICD-10-CM | POA: Diagnosis not present

## 2019-03-11 DIAGNOSIS — E113553 Type 2 diabetes mellitus with stable proliferative diabetic retinopathy, bilateral: Secondary | ICD-10-CM | POA: Diagnosis not present

## 2019-03-11 DIAGNOSIS — H26491 Other secondary cataract, right eye: Secondary | ICD-10-CM | POA: Diagnosis not present

## 2019-03-11 DIAGNOSIS — H26492 Other secondary cataract, left eye: Secondary | ICD-10-CM | POA: Diagnosis not present

## 2019-03-11 DIAGNOSIS — H401121 Primary open-angle glaucoma, left eye, mild stage: Secondary | ICD-10-CM | POA: Diagnosis not present

## 2019-03-11 DIAGNOSIS — H353112 Nonexudative age-related macular degeneration, right eye, intermediate dry stage: Secondary | ICD-10-CM | POA: Diagnosis not present

## 2019-03-12 ENCOUNTER — Telehealth: Payer: Self-pay | Admitting: Cardiology

## 2019-03-12 DIAGNOSIS — Z79899 Other long term (current) drug therapy: Secondary | ICD-10-CM

## 2019-03-12 MED ORDER — LISINOPRIL 40 MG PO TABS: 40.0000 mg | ORAL_TABLET | Freq: Every day | ORAL | 3 refills | Status: DC

## 2019-03-12 NOTE — Telephone Encounter (Signed)
Spoke with wife, will mail lab slip

## 2019-03-12 NOTE — Telephone Encounter (Signed)
Bp's too high, increase lisinorpil to 40mg  daily, bmet 2 weeks   [name] MD

## 2019-03-12 NOTE — Telephone Encounter (Signed)
I will forward to Dr Branch 

## 2019-03-12 NOTE — Telephone Encounter (Signed)
BP READINGS  03/10/2019 BP  195/127 P 78  03/11/2019 BP   151/106    P 77  03/12/2019 BP 152/74        P 72   # [phone]

## 2019-03-14 DIAGNOSIS — R69 Illness, unspecified: Secondary | ICD-10-CM | POA: Diagnosis not present

## 2019-03-17 ENCOUNTER — Ambulatory Visit (HOSPITAL_COMMUNITY)
Admission: RE | Admit: 2019-03-17 | Discharge: 2019-03-17 | Disposition: A | Discharge status: Home / Self Care | Payer: Medicare HMO | Source: Ambulatory Visit | Attending: Cardiology | Admitting: Cardiology

## 2019-03-17 ENCOUNTER — Other Ambulatory Visit: Payer: Self-pay

## 2019-03-17 DIAGNOSIS — I6523 Occlusion and stenosis of bilateral carotid arteries: Secondary | ICD-10-CM | POA: Diagnosis not present

## 2019-03-17 DIAGNOSIS — R0989 Other specified symptoms and signs involving the circulatory and respiratory systems: Secondary | ICD-10-CM | POA: Diagnosis not present

## 2019-03-18 ENCOUNTER — Telehealth: Payer: Self-pay

## 2019-03-18 DIAGNOSIS — I2510 Atherosclerotic heart disease of native coronary artery without angina pectoris: Secondary | ICD-10-CM

## 2019-03-18 DIAGNOSIS — H26492 Other secondary cataract, left eye: Secondary | ICD-10-CM | POA: Diagnosis not present

## 2019-03-18 NOTE — Telephone Encounter (Signed)
-----   Message from Arnoldo Lenis, MD sent at 03/18/2019 12:41 PM EST ----- Carotid US shows significant plaque on both sides. Likely will need additional testing to better quantify severity, can we refer to vascular for carotid stenosis   [name] MD

## 2019-03-18 NOTE — Telephone Encounter (Signed)
Order placed

## 2019-03-18 NOTE — Telephone Encounter (Signed)
Called pt, no answer. No voicemail available. Order placed.

## 2019-03-26 ENCOUNTER — Telehealth: Payer: Self-pay

## 2019-03-26 ENCOUNTER — Telehealth (HOSPITAL_COMMUNITY): Payer: Self-pay

## 2019-03-26 NOTE — Telephone Encounter (Signed)
Note   ----- Message from Arnoldo Lenis, MD sent at 03/18/2019 12:41 PM EST ----- Carotid US shows significant plaque on both sides. Likely will need additional testing to better quantify severity, can we refer to vascular for carotid stenosis   [name] MD

## 2019-03-26 NOTE — Telephone Encounter (Signed)
The above patient or their representative was contacted and gave the following answers to these questions:         Do you have any of the following symptoms?    NO  Fever                    Cough                   Shortness of breath  Do  you have any of the following other symptoms?    muscle pain         vomiting,        diarrhea        rash         weakness        red eye        abdominal pain         bruising          bruising or bleeding              joint pain           severe headache    Have you been in contact with someone who was or has been sick in the past 2 weeks?  NO  Yes                 Unsure                         Unable to assess   Does the person that you were in contact with have any of the following symptoms?   Cough         shortness of breath           muscle pain         vomiting,            diarrhea            rash            weakness           fever            red eye           abdominal pain           bruising  or  bleeding                joint pain                severe headache                 COMMENTS OR ACTION PLAN FOR THIS PATIENAL      ALL QUESTIONS WERE ANSWERED/[facility]

## 2019-03-26 NOTE — Telephone Encounter (Signed)
Spoke with wife and explained results.They have apt in 3 days to vascular

## 2019-03-26 NOTE — Telephone Encounter (Signed)
03-26-19/rec'd call from Pt's spouse(shelia) requesting a return call.  Ms.Meinders states they rec'd a call re:for Pt to call a surgeon.  Please call: [phone]

## 2019-03-29 ENCOUNTER — Encounter (HOSPITAL_COMMUNITY): Payer: Medicare HMO

## 2019-03-29 ENCOUNTER — Other Ambulatory Visit: Payer: Self-pay | Admitting: Cardiology

## 2019-03-29 ENCOUNTER — Other Ambulatory Visit: Payer: Self-pay

## 2019-03-29 DIAGNOSIS — Z79899 Other long term (current) drug therapy: Secondary | ICD-10-CM | POA: Diagnosis not present

## 2019-03-29 DIAGNOSIS — I6529 Occlusion and stenosis of unspecified carotid artery: Secondary | ICD-10-CM

## 2019-03-30 ENCOUNTER — Encounter: Payer: Medicare HMO | Admitting: Vascular Surgery

## 2019-03-30 LAB — BASIC METABOLIC PANEL
BUN/Creatinine Ratio: 13 (ref 10–24)
BUN: 11 mg/dL (ref 8–27)
CO2: 23 mmol/L (ref 20–29)
Calcium: 9.3 mg/dL (ref 8.6–10.2)
Chloride: 102 mmol/L (ref 96–106)
Creatinine, Ser: 0.84 mg/dL (ref 0.76–1.27)
GFR calc Af Amer: 105 mL/min/{1.73_m2} (ref >59)
GFR calc non Af Amer: 91 mL/min/{1.73_m2} (ref >59)
Glucose: 81 mg/dL (ref 65–99)
Potassium: 4.6 mmol/L (ref 3.5–5.2)
Sodium: 139 mmol/L (ref 134–144)

## 2019-03-30 LAB — SPECIMEN STATUS REPORT

## 2019-04-01 ENCOUNTER — Ambulatory Visit (HOSPITAL_COMMUNITY)
Admission: RE | Admit: 2019-04-01 | Discharge: 2019-04-01 | Disposition: A | Discharge status: Home / Self Care | Payer: Medicare HMO | Source: Ambulatory Visit | Attending: Vascular Surgery | Admitting: Vascular Surgery

## 2019-04-01 ENCOUNTER — Other Ambulatory Visit: Payer: Self-pay

## 2019-04-01 DIAGNOSIS — I6529 Occlusion and stenosis of unspecified carotid artery: Secondary | ICD-10-CM

## 2019-04-04 DIAGNOSIS — E1122 Type 2 diabetes mellitus with diabetic chronic kidney disease: Secondary | ICD-10-CM | POA: Diagnosis not present

## 2019-04-04 DIAGNOSIS — I5023 Acute on chronic systolic (congestive) heart failure: Secondary | ICD-10-CM | POA: Diagnosis not present

## 2019-04-04 DIAGNOSIS — N181 Chronic kidney disease, stage 1: Secondary | ICD-10-CM | POA: Diagnosis not present

## 2019-04-04 DIAGNOSIS — I130 Hypertensive heart and chronic kidney disease with heart failure and stage 1 through stage 4 chronic kidney disease, or unspecified chronic kidney disease: Secondary | ICD-10-CM | POA: Diagnosis not present

## 2019-04-05 ENCOUNTER — Telehealth: Payer: Self-pay

## 2019-04-05 MED ORDER — APIXABAN 5 MG PO TABS: 5.0000 mg | ORAL_TABLET | Freq: Two times a day (BID) | ORAL | 6 refills | Status: DC

## 2019-04-05 NOTE — Telephone Encounter (Signed)
-----   Message from Arnoldo Lenis, MD sent at 04/02/2019  1:12 PM EST ----- Normal labs  [name] MD

## 2019-04-05 NOTE — Telephone Encounter (Signed)
3 boxes eliquis 5 mg samples lot [id number] exp 1/23    Wife aware

## 2019-04-05 NOTE — Telephone Encounter (Signed)
Called pt. No answer. No voicemail available.

## 2019-04-05 NOTE — Telephone Encounter (Signed)
Per Mrs.Scheid, would like to have eliquis samples if available.  Please call [phone]   Thanks renee

## 2019-04-06 ENCOUNTER — Other Ambulatory Visit: Payer: Self-pay

## 2019-04-06 ENCOUNTER — Ambulatory Visit (INDEPENDENT_AMBULATORY_CARE_PROVIDER_SITE_OTHER): Payer: Medicare HMO | Admitting: Vascular Surgery

## 2019-04-06 ENCOUNTER — Encounter: Payer: Self-pay | Admitting: Vascular Surgery

## 2019-04-06 DIAGNOSIS — I6523 Occlusion and stenosis of bilateral carotid arteries: Secondary | ICD-10-CM

## 2019-04-06 DIAGNOSIS — I6529 Occlusion and stenosis of unspecified carotid artery: Secondary | ICD-10-CM | POA: Insufficient documentation

## 2019-04-06 NOTE — Progress Notes (Signed)
Virtual Visit via Telephone Note  Referring MD: Dr. Harl Bowie, cardiology  I connected with Xavier Caldwell on 04/06/2019 using the [employer] by telephone and verified that I was speaking with the correct person using two identifiers.   The limitations of evaluation and management by telemedicine and the availability of in person appointments have been previously discussed with the patient and are documented in the patients chart. The patient expressed understanding and consented to proceed.  PCP: Xavier School, MD   Chief Complaint: Bilateral carotid stenosis  History of Present Illness: Xavier Caldwell is a 69 y.o. male with HTN, diabetes, coronary artery disease status post STEMI 0000000 complicated by VT arrest requiring defib in cath lab, A. fib on Eliquis that presents for evaluation of bilateral carotid stenosis.  Patient was being followed by his cardiologist Dr. Harl Bowie who noted a carotid bruit and subsequently got carotid duplex revealing greater than 70% stenosis bilaterally.  Patient denies any history of TIAs or strokes.  He denies any vision loss in one eye or weakness in arm or leg.  States he has very limited mobility and does not get around very well.  No previous history of head or neck surgery.  Past Medical History:  Diagnosis Date  . Acute systolic (congestive) heart failure ([place])   . Anxiety   . Arthritis   . Atrial fibrillation ([place])   . Back pain   . Coronary artery disease   . Diabetes mellitus without complication ([place])   . High cholesterol   . History of gout   . Hypertension   . Myocardial infarction ([place]) 04/2018  . Neuropathy   . 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 Sleep apnea    Stop Bang score of 4  . STEMI (ST elevation myocardial infarction) ([place])    04/08/2018 PCI/DES RCA  . VT (ventricular tachycardia) ([place])     Past Surgical History:  Procedure Laterality Date  . APPENDECTOMY    . CARDIOVERSION N/A 04/13/2018   Procedure: CARDIOVERSION;  Surgeon: Skeet Latch, MD;  Location: [facility];  Service: Cardiovascular;  Laterality: N/A;  . CATARACT EXTRACTION W/PHACO Right 04/25/2014   Procedure: CATARACT EXTRACTION PHACO AND INTRAOCULAR LENS PLACEMENT ([place]);  Surgeon: Williams Che, MD;  [facility];  Service: Ophthalmology;  Laterality: Right;  CDE:4.70  . CATARACT EXTRACTION W/PHACO Left 07/11/2014   Procedure: CATARACT EXTRACTION PHACO AND INTRAOCULAR LENS PLACEMENT LEFT EYE CDE=8.32;  Surgeon: Williams Che, MD;  [facility];  Service: Ophthalmology;  Laterality: Left;  . COLONOSCOPY WITH PROPOFOL N/A 09/23/2016   Procedure: COLONOSCOPY WITH PROPOFOL;  Surgeon: Daneil Dolin, MD;  [facility];  Service: Endoscopy;  Laterality: N/A;  . CORONARY/GRAFT ACUTE MI REVASCULARIZATION N/A 04/07/2018   Procedure: Coronary/Graft Acute MI Revascularization;  Surgeon: Burnell Blanks, MD;  Location: [facility];  Service: Cardiovascular;  Laterality: N/A;  . LEFT HEART CATH AND CORONARY ANGIOGRAPHY N/A 04/07/2018   Procedure: LEFT HEART CATH AND CORONARY ANGIOGRAPHY;  Surgeon: Burnell Blanks, MD;  Location: [facility];  Service: Cardiovascular;  Laterality: N/A;  . POLYPECTOMY  09/23/2016   Procedure: POLYPECTOMY;  Surgeon: Daneil Dolin, MD;  [facility];  Service: Endoscopy;;  colon  . RETINAL DETACHMENT SURGERY      Current Meds  Medication Sig  . ALPRAZolam (XANAX) 0.5 MG tablet Take 0.5 mg by mouth 2 (two) times daily as needed  for anxiety.  Marland Kitchen apixaban (ELIQUIS) 5 MG TABS tablet Take 1 tablet (5 mg total) by mouth 2 (two) times daily.  Marland Kitchen atorvastatin (LIPITOR) 40 MG tablet TAKE 1 TABLET BY MOUTH ONCE DAILY 6 IN THE EVENING  . B Complex-C (SUPER B COMPLEX PO) Take 1 tablet by mouth daily.   . carvedilol (COREG) 3.125 MG tablet Take 1 tablet (3.125 mg total) by mouth 2 (two) times daily.  . clopidogrel (PLAVIX) 75 MG tablet TAKE 1 TABLET  BY MOUTH EVERY DAY  . DULoxetine (CYMBALTA) 60 MG capsule Take 60 mg by mouth daily with lunch.   . ergocalciferol (VITAMIN D2) 50000 units capsule Take 50,000 Units by mouth every Tuesday.   . gabapentin (NEURONTIN) 300 MG capsule   . glucose blood (ONETOUCH ULTRA) test strip USE ONE TEST STRIP AS DIRECTED TWICE DAILY  . HYDROcodone-acetaminophen (NORCO/VICODIN) 5-325 MG per tablet Take 1 tablet by mouth every 6 (six) hours as needed for moderate pain.  . Insulin Glargine (BASAGLAR KWIKPEN) 100 UNIT/ML SOPN Inject 0.6 mLs (60 Units total) into the skin at bedtime.  Marland Kitchen lisinopril (ZESTRIL) 40 MG tablet Take 1 tablet (40 mg total) by mouth daily.  . metFORMIN (GLUCOPHAGE) 1000 MG tablet TAKE 1 TABLET BY MOUTH TWICE DAILY  . nitroGLYCERIN (NITROSTAT) 0.4 MG SL tablet Place 1 tablet (0.4 mg total) under the tongue every 5 (five) minutes as needed.  Marland Kitchen rOPINIRole (REQUIP) 1 MG tablet Take 1 mg by mouth 3 (three) times daily.  . timolol (TIMOPTIC) 0.5 % ophthalmic solution Place 1 drop into both eyes 2 (two) times daily.   Marland Kitchen UNIFINE PENTIPS 31G X 5 MM MISC USE TO INJECT INSULIN AT BEDTIME (Patient taking differently: at bedtime. )    12 system ROS was negative unless otherwise noted in HPI   Observations/Objective:  I reviewed carotid duplex from 04/01/2019 and 60 to 79% stenosis bilaterally.  The right-side certainly looks higher grade and is on the upper margin of that range of stenosis.  Assessment and Plan:  69 year old male with multiple medical comorbidities including history of coronary artery disease s/p STEMI last year complicated by V. tach arrest, A. fib on Eliquis that presents with asymptomatic bilateral moderate carotid disease.  Discussed with patient in detail that given his duplex shows 60 to 79% stenosis in the setting of asymptomatic disease would recommend surveillance at this time.  I will plan to see him back in 6 months with another carotid duplex.  Certainly if his disease  progresses to more than 80% would require carotid intervention.  Follow Up Instructions:   Follow up 6 months w/ carotid duplex   I discussed the assessment and treatment plan with the patient. The patient was provided an opportunity to ask questions and all were answered. The patient agreed with the plan and demonstrated an understanding of the instructions.   The patient was advised to call back or seek an in-person evaluation if the symptoms worsen or if the condition fails to improve as anticipated.  I spent 11 minutes with the patient via telephone encounter and reviewing imaging.   Signed, Marty Heck [facility] Office: [phone]  04/06/2019, 12:55 PM

## 2019-04-07 ENCOUNTER — Other Ambulatory Visit: Payer: Self-pay | Admitting: *Deleted

## 2019-04-07 DIAGNOSIS — I6523 Occlusion and stenosis of bilateral carotid arteries: Secondary | ICD-10-CM

## 2019-04-21 DIAGNOSIS — R69 Illness, unspecified: Secondary | ICD-10-CM | POA: Diagnosis not present

## 2019-04-27 ENCOUNTER — Other Ambulatory Visit: Payer: Self-pay | Admitting: Physician Assistant

## 2019-04-27 NOTE — Telephone Encounter (Signed)
This is a [place] pt.  °

## 2019-04-28 ENCOUNTER — Other Ambulatory Visit: Payer: Self-pay | Admitting: Cardiology

## 2019-04-28 MED ORDER — CARVEDILOL 3.125 MG PO TABS: ORAL_TABLET | ORAL | 6 refills | Status: DC

## 2019-04-28 NOTE — Telephone Encounter (Signed)
Refill sent.

## 2019-04-28 NOTE — Telephone Encounter (Signed)
*  STAT* If patient is at the pharmacy, call can be transferred to refill team.   1. Which medications need to be refilled? (please list name of each medication and dose if known) carvedilol (COREG) 3.125 MG tablet  2. Which pharmacy/location (including street and city if local pharmacy) is medication to be sent to? [facility] - [place], [place] - [place] [address]  3. Do they need a 30 day or 90 day supply? 30 day

## 2019-05-03 DIAGNOSIS — M79671 Pain in right foot: Secondary | ICD-10-CM | POA: Diagnosis not present

## 2019-05-03 DIAGNOSIS — I739 Peripheral vascular disease, unspecified: Secondary | ICD-10-CM | POA: Diagnosis not present

## 2019-05-04 ENCOUNTER — Other Ambulatory Visit (HOSPITAL_COMMUNITY): Payer: Self-pay | Admitting: Podiatry

## 2019-05-04 ENCOUNTER — Other Ambulatory Visit: Payer: Self-pay | Admitting: Podiatry

## 2019-05-04 DIAGNOSIS — R0989 Other specified symptoms and signs involving the circulatory and respiratory systems: Secondary | ICD-10-CM

## 2019-05-05 DIAGNOSIS — N181 Chronic kidney disease, stage 1: Secondary | ICD-10-CM | POA: Diagnosis not present

## 2019-05-05 DIAGNOSIS — I130 Hypertensive heart and chronic kidney disease with heart failure and stage 1 through stage 4 chronic kidney disease, or unspecified chronic kidney disease: Secondary | ICD-10-CM | POA: Diagnosis not present

## 2019-05-05 DIAGNOSIS — E1122 Type 2 diabetes mellitus with diabetic chronic kidney disease: Secondary | ICD-10-CM | POA: Diagnosis not present

## 2019-05-05 DIAGNOSIS — I5023 Acute on chronic systolic (congestive) heart failure: Secondary | ICD-10-CM | POA: Diagnosis not present

## 2019-05-10 ENCOUNTER — Other Ambulatory Visit: Payer: Self-pay

## 2019-05-10 ENCOUNTER — Ambulatory Visit (HOSPITAL_COMMUNITY)
Admission: RE | Admit: 2019-05-10 | Discharge: 2019-05-10 | Disposition: A | Discharge status: Home / Self Care | Payer: Medicare HMO | Source: Ambulatory Visit | Attending: Podiatry | Admitting: Podiatry

## 2019-05-10 DIAGNOSIS — R0989 Other specified symptoms and signs involving the circulatory and respiratory systems: Secondary | ICD-10-CM | POA: Diagnosis not present

## 2019-05-10 DIAGNOSIS — I739 Peripheral vascular disease, unspecified: Secondary | ICD-10-CM | POA: Diagnosis not present

## 2019-05-11 DIAGNOSIS — Z1389 Encounter for screening for other disorder: Secondary | ICD-10-CM | POA: Diagnosis not present

## 2019-05-11 DIAGNOSIS — G629 Polyneuropathy, unspecified: Secondary | ICD-10-CM | POA: Diagnosis not present

## 2019-05-11 DIAGNOSIS — R531 Weakness: Secondary | ICD-10-CM | POA: Diagnosis not present

## 2019-05-11 DIAGNOSIS — R69 Illness, unspecified: Secondary | ICD-10-CM | POA: Diagnosis not present

## 2019-05-11 DIAGNOSIS — Z6830 Body mass index (BMI) 30.0-30.9, adult: Secondary | ICD-10-CM | POA: Diagnosis not present

## 2019-05-11 DIAGNOSIS — I739 Peripheral vascular disease, unspecified: Secondary | ICD-10-CM | POA: Diagnosis not present

## 2019-05-14 ENCOUNTER — Other Ambulatory Visit: Payer: Self-pay | Admitting: Cardiology

## 2019-05-17 DIAGNOSIS — I739 Peripheral vascular disease, unspecified: Secondary | ICD-10-CM | POA: Diagnosis not present

## 2019-05-17 DIAGNOSIS — M79671 Pain in right foot: Secondary | ICD-10-CM | POA: Diagnosis not present

## 2019-06-03 ENCOUNTER — Telehealth: Payer: Self-pay | Admitting: *Deleted

## 2019-06-03 NOTE — Telephone Encounter (Signed)
Pt notified he was approved for [employer] Patient assistance foundation until 02/04/20.

## 2019-06-04 DIAGNOSIS — I130 Hypertensive heart and chronic kidney disease with heart failure and stage 1 through stage 4 chronic kidney disease, or unspecified chronic kidney disease: Secondary | ICD-10-CM | POA: Diagnosis not present

## 2019-06-04 DIAGNOSIS — E1122 Type 2 diabetes mellitus with diabetic chronic kidney disease: Secondary | ICD-10-CM | POA: Diagnosis not present

## 2019-06-04 DIAGNOSIS — N181 Chronic kidney disease, stage 1: Secondary | ICD-10-CM | POA: Diagnosis not present

## 2019-06-04 DIAGNOSIS — I5023 Acute on chronic systolic (congestive) heart failure: Secondary | ICD-10-CM | POA: Diagnosis not present

## 2019-06-07 ENCOUNTER — Telehealth (HOSPITAL_COMMUNITY): Payer: Self-pay

## 2019-06-07 NOTE — Telephone Encounter (Signed)

## 2019-06-08 ENCOUNTER — Ambulatory Visit: Payer: Medicare HMO | Admitting: Vascular Surgery

## 2019-06-08 ENCOUNTER — Other Ambulatory Visit: Payer: Self-pay

## 2019-06-08 ENCOUNTER — Encounter: Payer: Self-pay | Admitting: Vascular Surgery

## 2019-06-08 DIAGNOSIS — R69 Illness, unspecified: Secondary | ICD-10-CM | POA: Diagnosis not present

## 2019-06-08 DIAGNOSIS — I739 Peripheral vascular disease, unspecified: Secondary | ICD-10-CM

## 2019-06-08 NOTE — Progress Notes (Signed)
Patient name: Xavier Caldwell MRN: [id number] DOB: 17-Jan-1951 Sex: male  REASON FOR VISIT: Evaluate for PVD  HPI: Xavier Caldwell is a 69 y.o. male with history of coronary disease status post STEMI complicated by V. tach arrest, A. fib on Eliquis, asymptomatic carotid stenosis, HTN, HLD that presents for evaluation of PVD.  Patient states that he saw a physician in [place] was complaining about leg pain and ultimately had noninvasive imaging done in [place].  He states both of his legs bother him and really feel weak and sometimes he falls.  He states at times the left leg is more bothersome than the right.  No significant pain at night.  No wound healing problems at this time.  Does not feel one leg is more bothersome than another.  ABis were normal on left at 0.99 and 0.63 right.  Does walk with a cane.    Past Medical History:  Diagnosis Date  . Acute systolic (congestive) heart failure ([place])   . Anxiety   . Arthritis   . Atrial fibrillation ([place])   . Back pain   . Coronary artery disease   . Diabetes mellitus without complication ([place])   . High cholesterol   . History of gout   . Hypertension   . Myocardial infarction ([place]) 04/2018  . Neuropathy   . 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 Sleep apnea    Stop Bang score of 4  . STEMI (ST elevation myocardial infarction) ([place])    04/08/2018 PCI/DES RCA  . VT (ventricular tachycardia) ([place])     Past Surgical History:  Procedure Laterality Date  . APPENDECTOMY    . CARDIOVERSION N/A 04/13/2018   Procedure: CARDIOVERSION;  Surgeon: Skeet Latch, MD;  Location: [facility];  Service: Cardiovascular;  Laterality: N/A;  . CATARACT EXTRACTION W/PHACO Right 04/25/2014   Procedure: CATARACT EXTRACTION PHACO AND INTRAOCULAR LENS PLACEMENT ([place]);  Surgeon: Williams Che, MD;  [facility];  Service: Ophthalmology;  Laterality: Right;  CDE:4.70  . CATARACT EXTRACTION W/PHACO Left 07/11/2014   Procedure:  CATARACT EXTRACTION PHACO AND INTRAOCULAR LENS PLACEMENT LEFT EYE CDE=8.32;  Surgeon: Williams Che, MD;  [facility];  Service: Ophthalmology;  Laterality: Left;  . COLONOSCOPY WITH PROPOFOL N/A 09/23/2016   Procedure: COLONOSCOPY WITH PROPOFOL;  Surgeon: Daneil Dolin, MD;  [facility];  Service: Endoscopy;  Laterality: N/A;  . CORONARY/GRAFT ACUTE MI REVASCULARIZATION N/A 04/07/2018   Procedure: Coronary/Graft Acute MI Revascularization;  Surgeon: Burnell Blanks, MD;  Location: [facility];  Service: Cardiovascular;  Laterality: N/A;  . LEFT HEART CATH AND CORONARY ANGIOGRAPHY N/A 04/07/2018   Procedure: LEFT HEART CATH AND CORONARY ANGIOGRAPHY;  Surgeon: Burnell Blanks, MD;  Location: [facility];  Service: Cardiovascular;  Laterality: N/A;  . POLYPECTOMY  09/23/2016   Procedure: POLYPECTOMY;  Surgeon: Daneil Dolin, MD;  [facility];  Service: Endoscopy;;  colon  . RETINAL DETACHMENT SURGERY      Family History  Problem Relation Age of Onset  . Hypertension Father   . Colon cancer Neg Hx     SOCIAL HISTORY: Social History   Tobacco Use  . Smoking status: Former Smoker    Packs/day: 1.50    Years: 30.00    Pack years: 45.00    Types: Cigarettes    Quit date: 07/05/2012    Years since quitting: 6.9  . Smokeless tobacco:  Never Used  Substance Use Topics  . Alcohol use: No    No Known Allergies  Current Outpatient Medications  Medication Sig Dispense Refill  . ALPRAZolam (XANAX) 0.5 MG tablet Take 0.5 mg by mouth 2 (two) times daily as needed for anxiety.    Marland Kitchen apixaban (ELIQUIS) 5 MG TABS tablet Take 1 tablet (5 mg total) by mouth 2 (two) times daily. 60 tablet 6  . atorvastatin (LIPITOR) 40 MG tablet TAKE 1 TABLET BY MOUTH ONCE DAILY 6 IN THE EVENING 30 tablet 11  . B Complex-C (SUPER B COMPLEX PO) Take 1 tablet by mouth daily.     . carvedilol (COREG) 3.125 MG tablet TAKE ONE TABLET BY MOUTH EVERY MORNING and  TAKE ONE TABLET BY MOUTH EVERY EVENING with meals 60 tablet 6  . clopidogrel (PLAVIX) 75 MG tablet TAKE 1 TABLET BY MOUTH EVERY DAY 90 tablet 1  . DULoxetine (CYMBALTA) 60 MG capsule Take 60 mg by mouth daily with lunch.     . ergocalciferol (VITAMIN D2) 50000 units capsule Take 50,000 Units by mouth every Tuesday.     Marland Kitchen glucose blood (ONETOUCH ULTRA) test strip USE ONE TEST STRIP AS DIRECTED TWICE DAILY 100 each 0  . HYDROcodone-acetaminophen (NORCO/VICODIN) 5-325 MG per tablet Take 1 tablet by mouth every 6 (six) hours as needed for moderate pain.    . Insulin Glargine (BASAGLAR KWIKPEN) 100 UNIT/ML SOPN Inject 0.6 mLs (60 Units total) into the skin at bedtime. 45 mL 0  . lisinopril (ZESTRIL) 40 MG tablet Take 1 tablet (40 mg total) by mouth daily. 90 tablet 3  . metFORMIN (GLUCOPHAGE) 1000 MG tablet TAKE 1 TABLET BY MOUTH TWICE DAILY 180 tablet 0  . nitroGLYCERIN (NITROSTAT) 0.4 MG SL tablet Place 1 tablet (0.4 mg total) under the tongue every 5 (five) minutes as needed. 25 tablet 2  . rOPINIRole (REQUIP) 1 MG tablet Take 1 mg by mouth 3 (three) times daily.    . timolol (TIMOPTIC) 0.5 % ophthalmic solution Place 1 drop into both eyes 2 (two) times daily.     Marland Kitchen UNIFINE PENTIPS 31G X 5 MM MISC USE TO INJECT INSULIN AT BEDTIME (Patient taking differently: at bedtime. ) 100 each 5  . gabapentin (NEURONTIN) 300 MG capsule      No current facility-administered medications for this visit.    REVIEW OF SYSTEMS:  [X]  denotes positive finding, [ ]  denotes negative finding Cardiac  Comments:  Chest pain or chest pressure:    Shortness of breath upon exertion:    Short of breath when lying flat:    Irregular heart rhythm:        Vascular    Pain in calf, thigh, or hip brought on by ambulation:    Pain in feet at night that wakes you up from your sleep:     Blood clot in your veins:    Leg swelling:         Pulmonary    Oxygen at home:    Productive cough:     Wheezing:         Neurologic     Sudden weakness in arms or legs:     Sudden numbness in arms or legs:     Sudden onset of difficulty speaking or slurred speech:    Temporary loss of vision in one eye:     Problems with dizziness:         Gastrointestinal    Blood in stool:  Vomited blood:         Genitourinary    Burning when urinating:     Blood in urine:        Psychiatric    Major depression:         Hematologic    Bleeding problems:    Problems with blood clotting too easily:        Skin    Rashes or ulcers:        Constitutional    Fever or chills:      PHYSICAL EXAM: Vitals:   06/08/19 0919  BP: (!) 146/68  Pulse: 64  Resp: 20  Temp: 97.6 F (36.4 C)  SpO2: 97%  Weight: 212 lb (96.2 kg)  Height: 5\' 11"  (1.803 m)    GENERAL: The patient is a well-nourished male, in no acute distress. The vital signs are documented above. CARDIAC: There is a regular rate and rhythm.  VASCULAR:  Palpable femoral pulses both groins Right foot warm, non-palpable pulses Left foot palpable DP No lower extremity wound PULMONARY: There is good air exchange bilaterally without wheezing or rales. ABDOMEN: Soft and non-tender with normal pitched bowel sounds.  MUSCULOSKELETAL: There are no major deformities or cyanosis. NEUROLOGIC: No focal weakness or paresthesias are detected. SKIN: There are no ulcers or rashes noted. PSYCHIATRIC: The patient has a normal affect.  DATA:   I reviewed ABIs that were done on 05/10/2019 and 0.99 on the left normal and 0.63 on the right  Assessment/Plan:  69 year old male who was previously seen for asymptomatic carotid disease with a 60 to 79% stenosis on the right who now presents for evaluation of PVD.  I discussed that certainly he has abnormal ABIs on the right at 0.63 and normal ABI on the left at 0.99.  However his symptoms do not correlate at all and he states that both legs bother him and at times his left leg is much more bothersome.  He has a easily palpable  pulse on the left foot with a normal ABI.  Discussed that it would be hard for me to justify any intervention on the right leg given the history that he provides.  I think with an ABI 0.63 at worst he would be in the claudication range but he has very difficult time ambulating.  I have recommended conservative management for now.  I discussed the importance of walking therapies etc.  I will see him in August when he is due for surveillance of his carotid disease and will also add on ABIs for further management.  No rest pain or nonhealing wounds at this time to indicate CLI.   Marty Heck, MD [facility] Office: [phone]

## 2019-06-09 ENCOUNTER — Other Ambulatory Visit: Payer: Self-pay | Admitting: *Deleted

## 2019-06-09 DIAGNOSIS — I739 Peripheral vascular disease, unspecified: Secondary | ICD-10-CM

## 2019-06-14 ENCOUNTER — Encounter (INDEPENDENT_AMBULATORY_CARE_PROVIDER_SITE_OTHER): Payer: Self-pay | Admitting: Ophthalmology

## 2019-06-14 ENCOUNTER — Ambulatory Visit (INDEPENDENT_AMBULATORY_CARE_PROVIDER_SITE_OTHER): Payer: Medicare HMO | Admitting: Ophthalmology

## 2019-06-14 ENCOUNTER — Other Ambulatory Visit: Payer: Self-pay

## 2019-06-14 DIAGNOSIS — Z794 Long term (current) use of insulin: Secondary | ICD-10-CM | POA: Diagnosis not present

## 2019-06-14 DIAGNOSIS — E119 Type 2 diabetes mellitus without complications: Secondary | ICD-10-CM | POA: Insufficient documentation

## 2019-06-14 DIAGNOSIS — H26491 Other secondary cataract, right eye: Secondary | ICD-10-CM | POA: Diagnosis not present

## 2019-06-14 DIAGNOSIS — H26492 Other secondary cataract, left eye: Secondary | ICD-10-CM | POA: Diagnosis not present

## 2019-06-14 DIAGNOSIS — E113553 Type 2 diabetes mellitus with stable proliferative diabetic retinopathy, bilateral: Secondary | ICD-10-CM

## 2019-06-14 DIAGNOSIS — H401121 Primary open-angle glaucoma, left eye, mild stage: Secondary | ICD-10-CM | POA: Diagnosis not present

## 2019-06-14 NOTE — Assessment & Plan Note (Signed)

## 2019-06-14 NOTE — Patient Instructions (Signed)
Diabetic Retinopathy Diabetic retinopathy is a disease of the retina. The retina is a light-sensitive membrane at the back of the eye. Retinopathy is a complication of diabetes (diabetes mellitus) and a common cause of bad eyesight (visual impairment). It can eventually cause blindness. Early detection and treatment of diabetic retinopathy is important in keeping your eyes healthy and preventing further damage to them. What are the causes? Diabetic retinopathy is caused by blood sugar (glucose) levels that are too high for an extended period of time. High blood glucose over an extended period of time can:  Damage small blood vessels in the retina, allowing blood to leak through the vessel walls.  Cause new, abnormal blood vessels to grow on the retina. This can scar the retina in the advanced stage of diabetic retinopathy. What increases the risk? You are more likely to develop this condition if:  You have had diabetes for a long time.  You have poorly controlled blood glucose.  You have high blood pressure. What are the signs or symptoms? In the early stages of diabetic retinopathy, there are often no symptoms. As the condition gets worse, symptoms may include:  Blurred vision. This is usually caused by swelling due to abnormal blood glucose levels. The blurriness may go away when blood glucose levels return to normal.  Moving specks or dark spots (floaters) in your vision. These can be caused by a small amount of bleeding (hemorrhage) from retinal blood vessels.  Missing parts of your field of vision, such as vision at the sides of the eyes. This can be caused by larger retinal hemorrhages.  Difficulty reading.  Double vision.  Pain in one or both eyes.  Feeling pressure in one or both eyes.  Trouble seeing straight lines. Straight lines may not look straight.  Redness of the eyes that does not go away. How is this diagnosed? This condition may be diagnosed with an eye exam in  which your eye care specialist puts drops in your eyes that enlarge (dilate) your pupils. This lets your health care provider examine your retina and check for changes in your retinal blood vessels. How is this treated? This condition may be treated by:  Keeping your blood glucose and blood pressure within a target range.  Using a type of laser beam to seal your retinal blood vessels. This stops them from bleeding and decreases pressure in your eye.  Getting shots of medicine in the eye to reduce swelling of the center of the retina (macula). You may be given: ? Anti-VEGF medicine. This medicine can help slow vision loss, and may even improve vision. ? Steroid medicine. Follow these instructions at home:   Follow your diabetes management plan as directed by your health care provider. This may include exercising regularly and eating a healthy diet.  Keep your blood glucose level and your blood pressure in your target range, as directed by your health care provider.  Check your blood glucose as often as directed.  Take over the counter and prescription medicines only as told by your health care provider. This includes insulin and oral diabetes medicine.  Get your eyes checked at least once every year. An eye specialist can usually see diabetic retinopathy developing long before it starts to cause problems. In many cases, it can be treated to prevent complications from occurring.  Do not use any products that contain nicotine or tobacco, such as cigarettes and e-cigarettes. If you need help quitting, ask your health care provider.  Keep all follow-up   visits as told by your health care provider. This is important. Contact a health care provider if:  You notice gradual blurring or other changes in your vision over time.  You notice that your glasses or contact lenses do not make things look as sharp as they once did.  You have trouble reading or seeing details at a distance with either  eye.  You notice a change in your vision or notice that parts of your field of vision appear missing or hazy.  You suddenly see moving specks or dark spots in the field of vision of either eye. Get help right away if:  You have sudden pain or pressure in one or both eyes.  You suddenly lose vision or a curtain or veil seems to come across your eyes.  You have a sudden burst of floaters in your vision. Summary  Diabetic retinopathy is a disease of the retina. The retina is a light-sensitive membrane at the back of the eye. Retinopathy is a complication of diabetes.  Get your eyes checked at least once every year. An eye specialist can usually see diabetic retinopathy developing long before it starts to cause problems. In many cases, it can be treated to prevent complications from occurring.  Keep your blood glucose and your blood pressure in target range. Follow your diabetes management plan as directed by your health care provider.  Protect your eyes. Wear sunglasses and eye protection when needed. This information is not intended to replace advice given to you by your health care provider. Make sure you discuss any questions you have with your health care provider. Document Revised: 03/05/2017 Document Reviewed: 02/26/2016 Elsevier Patient Education  2020 [employer]  

## 2019-06-14 NOTE — Progress Notes (Signed)
06/14/2019     CHIEF COMPLAINT Patient presents for Retina Follow Up   HISTORY OF PRESENT ILLNESS: Xavier Caldwell is a 69 y.o. male who presents to the clinic today for:   HPI    Retina Follow Up    Patient presents with  Diabetic Retinopathy.  In both eyes.  This started 3 months ago.  Severity is mild.  Duration of 3 months.  Since onset it is gradually improving.          Comments    3 Month Diabetic F/U OU  Pt sts VA has improved since last visit OU. Pt sts colors are more vibrant since YAG laser last visit OS. No new symptoms reported OU. LBS: 83 this AM A1c: 7.5, 03/2019       Last edited by Rockie Neighbours, [place] on 06/14/2019  9:20 AM. (History)      Referring physician: Redmond School, MD [address] [place],  [place] [address]  HISTORICAL INFORMATION:   Selected notes from the MEDICAL RECORD NUMBER    Lab Results  Component Value Date   HGBA1C 7.5 (A) 03/04/2019     CURRENT MEDICATIONS: Current Outpatient Medications (Ophthalmic Drugs)  Medication Sig  . timolol (TIMOPTIC) 0.5 % ophthalmic solution Place 1 drop into both eyes 2 (two) times daily.    No current facility-administered medications for this visit. (Ophthalmic Drugs)   Current Outpatient Medications (Other)  Medication Sig  . ALPRAZolam (XANAX) 0.5 MG tablet Take 0.5 mg by mouth 2 (two) times daily as needed for anxiety.  Marland Kitchen apixaban (ELIQUIS) 5 MG TABS tablet Take 1 tablet (5 mg total) by mouth 2 (two) times daily.  Marland Kitchen atorvastatin (LIPITOR) 40 MG tablet TAKE 1 TABLET BY MOUTH ONCE DAILY 6 IN THE EVENING  . B Complex-C (SUPER B COMPLEX PO) Take 1 tablet by mouth daily.   . carvedilol (COREG) 3.125 MG tablet TAKE ONE TABLET BY MOUTH EVERY MORNING and TAKE ONE TABLET BY MOUTH EVERY EVENING with meals  . clopidogrel (PLAVIX) 75 MG tablet TAKE 1 TABLET BY MOUTH EVERY DAY  . DULoxetine (CYMBALTA) 60 MG capsule Take 60 mg by mouth daily with lunch.   . ergocalciferol (VITAMIN D2) 50000 units  capsule Take 50,000 Units by mouth every Tuesday.   . gabapentin (NEURONTIN) 300 MG capsule   . glucose blood (ONETOUCH ULTRA) test strip USE ONE TEST STRIP AS DIRECTED TWICE DAILY  . HYDROcodone-acetaminophen (NORCO/VICODIN) 5-325 MG per tablet Take 1 tablet by mouth every 6 (six) hours as needed for moderate pain.  . Insulin Glargine (BASAGLAR KWIKPEN) 100 UNIT/ML SOPN Inject 0.6 mLs (60 Units total) into the skin at bedtime.  Marland Kitchen lisinopril (ZESTRIL) 40 MG tablet Take 1 tablet (40 mg total) by mouth daily.  . metFORMIN (GLUCOPHAGE) 1000 MG tablet TAKE 1 TABLET BY MOUTH TWICE DAILY  . nitroGLYCERIN (NITROSTAT) 0.4 MG SL tablet Place 1 tablet (0.4 mg total) under the tongue every 5 (five) minutes as needed.  Marland Kitchen rOPINIRole (REQUIP) 1 MG tablet Take 1 mg by mouth 3 (three) times daily.  Marland Kitchen UNIFINE PENTIPS 31G X 5 MM MISC USE TO INJECT INSULIN AT BEDTIME (Patient taking differently: at bedtime. )   No current facility-administered medications for this visit. (Other)      REVIEW OF SYSTEMS:    ALLERGIES No Known Allergies  PAST MEDICAL HISTORY Past Medical History:  Diagnosis Date  . Acute systolic (congestive) heart failure ([place])   . Anxiety   . Arthritis   .  Atrial fibrillation ([place])   . Back pain   . Coronary artery disease   . Diabetes mellitus without complication ([place])   . High cholesterol   . History of gout   . Hypertension   . Myocardial infarction ([place]) 04/2018  . Neuropathy   . 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 Sleep apnea    Stop Bang score of 4  . STEMI (ST elevation myocardial infarction) ([place])    04/08/2018 PCI/DES RCA  . VT (ventricular tachycardia) ([place])    Past Surgical History:  Procedure Laterality Date  . APPENDECTOMY    . CARDIOVERSION N/A 04/13/2018   Procedure: CARDIOVERSION;  Surgeon: Skeet Latch, MD;  Location: [facility];  Service: Cardiovascular;  Laterality: N/A;  . CATARACT EXTRACTION W/PHACO Right 04/25/2014    Procedure: CATARACT EXTRACTION PHACO AND INTRAOCULAR LENS PLACEMENT ([place]);  Surgeon: [place] Che, MD;  Location: [facility];  Service: Ophthalmology;  Laterality: Right;  CDE:4.70  . CATARACT EXTRACTION W/PHACO Left 07/11/2014   Procedure: CATARACT EXTRACTION PHACO AND INTRAOCULAR LENS PLACEMENT LEFT EYE CDE=8.32;  Surgeon: [place] Che, MD;  Location: [facility];  Service: Ophthalmology;  Laterality: Left;  . COLONOSCOPY WITH PROPOFOL N/A 09/23/2016   Procedure: COLONOSCOPY WITH PROPOFOL;  Surgeon: Daneil Dolin, MD;  [facility];  Service: Endoscopy;  Laterality: N/A;  . CORONARY/GRAFT ACUTE MI REVASCULARIZATION N/A 04/07/2018   Procedure: Coronary/Graft Acute MI Revascularization;  Surgeon: Burnell Blanks, MD;  Location: [facility];  Service: Cardiovascular;  Laterality: N/A;  . LEFT HEART CATH AND CORONARY ANGIOGRAPHY N/A 04/07/2018   Procedure: LEFT HEART CATH AND CORONARY ANGIOGRAPHY;  Surgeon: Burnell Blanks, MD;  Location: [facility];  Service: Cardiovascular;  Laterality: N/A;  . POLYPECTOMY  09/23/2016   Procedure: POLYPECTOMY;  Surgeon: Daneil Dolin, MD;  [facility];  Service: Endoscopy;;  colon  . RETINAL DETACHMENT SURGERY      FAMILY HISTORY Family History  Problem Relation Age of Onset  . Hypertension Father   . Colon cancer Neg Hx     SOCIAL HISTORY Social History   Tobacco Use  . Smoking status: Former Smoker    Packs/day: 1.50    Years: 30.00    Pack years: 45.00    Types: Cigarettes    Quit date: 07/05/2012    Years since quitting: 6.9  . Smokeless tobacco: Never Used  Substance Use Topics  . Alcohol use: No  . Drug use: No         OPHTHALMIC EXAM: Base Eye Exam    Visual Acuity (ETDRS)      Right Left   Dist cc 20/40 +1 20/50 -2   Dist ph cc 20/30 -2 NI   Correction: Glasses       Tonometry (Tonopen, 9:25 AM)      Right Left   Pressure 17 18       Pupils      Dark Light Shape React APD     Right 4 3 Round Brisk None   Left 5 4 Round Sluggish None       Visual Fields (Counting fingers)      Left Right    Full Full       Extraocular Movement      Right Left    Full Full       Neuro/Psych    Oriented x3: Yes   Mood/Affect: Normal  Dilation    Both eyes: 1.0% Mydriacyl, 2.5% Phenylephrine @ 9:26 AM          IMAGING AND PROCEDURES  Imaging and Procedures for 06/14/19           ASSESSMENT/PLAN:  No problem-specific Assessment & Plan notes found for this encounter.      ICD-10-CM   1. Controlled type 2 diabetes mellitus with stable proliferative retinopathy of both eyes, with long-term current use of insulin (HCC)  [id number] Color Fundus Photography Optos - OU - Both Eyes   Z79.4   2. Left posterior capsular opacification  H26.492   3. Right posterior capsular opacification  H26.491   4. Primary open angle glaucoma of left eye, mild stage  H40.1121     1.  2.  3.  Ophthalmic Meds Ordered this visit:  No orders of the defined types were placed in this encounter.      No follow-ups on file.  There are no Patient Instructions on file for this visit.   Explained the diagnoses, plan, and follow up with the patient and they expressed understanding.  Patient expressed understanding of the importance of proper follow up care.   Clent Demark Ammy Lienhard M.D. Diseases & Surgery of the Retina and Vitreous Retina & Diabetic [facility] 06/14/19     Abbreviations: M myopia (nearsighted); A astigmatism; H hyperopia (farsighted); P presbyopia; Mrx spectacle prescription;  CTL contact lenses; OD right eye; OS left eye; OU both eyes  XT exotropia; ET esotropia; PEK punctate epithelial keratitis; PEE punctate epithelial erosions; DES dry eye syndrome; MGD meibomian gland dysfunction; ATs artificial tears; PFAT's preservative free artificial tears; [place] nuclear sclerotic cataract; PSC posterior subcapsular cataract; ERM epi-retinal membrane; PVD posterior  vitreous detachment; RD retinal detachment; DM diabetes mellitus; DR diabetic retinopathy; NPDR non-proliferative diabetic retinopathy; PDR proliferative diabetic retinopathy; CSME clinically significant macular edema; DME diabetic macular edema; dbh dot blot hemorrhages; CWS cotton wool spot; POAG primary open angle glaucoma; C/D cup-to-disc ratio; HVF humphrey visual field; GVF goldmann visual field; OCT optical coherence tomography; IOP intraocular pressure; BRVO Branch retinal vein occlusion; CRVO central retinal vein occlusion; CRAO central retinal artery occlusion; BRAO branch retinal artery occlusion; RT retinal tear; SB scleral buckle; PPV pars plana vitrectomy; VH Vitreous hemorrhage; PRP panretinal laser photocoagulation; IVK intravitreal kenalog; VMT vitreomacular traction; MH Macular hole;  NVD neovascularization of the disc; NVE neovascularization elsewhere; AREDS age related eye disease study; ARMD age related macular degeneration; POAG primary open angle glaucoma; EBMD epithelial/anterior basement membrane dystrophy; ACIOL anterior chamber intraocular lens; IOL intraocular lens; PCIOL posterior chamber intraocular lens; Phaco/IOL phacoemulsification with intraocular lens placement; [place] photorefractive keratectomy; LASIK laser assisted in situ keratomileusis; HTN hypertension; DM diabetes mellitus; COPD chronic obstructive pulmonary disease

## 2019-06-22 ENCOUNTER — Other Ambulatory Visit: Payer: Self-pay | Admitting: Cardiology

## 2019-06-23 ENCOUNTER — Other Ambulatory Visit: Payer: Self-pay | Admitting: "Endocrinology

## 2019-06-28 DIAGNOSIS — I10 Essential (primary) hypertension: Secondary | ICD-10-CM | POA: Diagnosis not present

## 2019-06-28 DIAGNOSIS — E782 Mixed hyperlipidemia: Secondary | ICD-10-CM | POA: Diagnosis not present

## 2019-06-28 DIAGNOSIS — Z0000 Encounter for general adult medical examination without abnormal findings: Secondary | ICD-10-CM | POA: Diagnosis not present

## 2019-06-28 DIAGNOSIS — E119 Type 2 diabetes mellitus without complications: Secondary | ICD-10-CM | POA: Diagnosis not present

## 2019-06-28 DIAGNOSIS — Z0001 Encounter for general adult medical examination with abnormal findings: Secondary | ICD-10-CM | POA: Diagnosis not present

## 2019-06-28 DIAGNOSIS — L600 Ingrowing nail: Secondary | ICD-10-CM | POA: Diagnosis not present

## 2019-06-28 DIAGNOSIS — E7849 Other hyperlipidemia: Secondary | ICD-10-CM | POA: Diagnosis not present

## 2019-06-28 DIAGNOSIS — Z1389 Encounter for screening for other disorder: Secondary | ICD-10-CM | POA: Diagnosis not present

## 2019-06-28 DIAGNOSIS — L989 Disorder of the skin and subcutaneous tissue, unspecified: Secondary | ICD-10-CM | POA: Diagnosis not present

## 2019-06-28 DIAGNOSIS — Z6830 Body mass index (BMI) 30.0-30.9, adult: Secondary | ICD-10-CM | POA: Diagnosis not present

## 2019-07-05 DIAGNOSIS — N181 Chronic kidney disease, stage 1: Secondary | ICD-10-CM | POA: Diagnosis not present

## 2019-07-05 DIAGNOSIS — I5023 Acute on chronic systolic (congestive) heart failure: Secondary | ICD-10-CM | POA: Diagnosis not present

## 2019-07-05 DIAGNOSIS — E1122 Type 2 diabetes mellitus with diabetic chronic kidney disease: Secondary | ICD-10-CM | POA: Diagnosis not present

## 2019-07-05 DIAGNOSIS — I130 Hypertensive heart and chronic kidney disease with heart failure and stage 1 through stage 4 chronic kidney disease, or unspecified chronic kidney disease: Secondary | ICD-10-CM | POA: Diagnosis not present

## 2019-07-06 ENCOUNTER — Telehealth: Payer: Self-pay | Admitting: "Endocrinology

## 2019-07-06 ENCOUNTER — Other Ambulatory Visit: Payer: Self-pay

## 2019-07-06 ENCOUNTER — Ambulatory Visit: Payer: Medicare HMO | Admitting: "Endocrinology

## 2019-07-06 ENCOUNTER — Encounter: Payer: Self-pay | Admitting: "Endocrinology

## 2019-07-06 VITALS — BP 144/77 | HR 64 | Ht 71.0 in | Wt 211.2 lb

## 2019-07-06 DIAGNOSIS — E118 Type 2 diabetes mellitus with unspecified complications: Secondary | ICD-10-CM

## 2019-07-06 DIAGNOSIS — IMO0002 Reserved for concepts with insufficient information to code with codable children: Secondary | ICD-10-CM

## 2019-07-06 DIAGNOSIS — E119 Type 2 diabetes mellitus without complications: Secondary | ICD-10-CM | POA: Diagnosis not present

## 2019-07-06 DIAGNOSIS — Z794 Long term (current) use of insulin: Secondary | ICD-10-CM

## 2019-07-06 DIAGNOSIS — E1165 Type 2 diabetes mellitus with hyperglycemia: Secondary | ICD-10-CM | POA: Diagnosis not present

## 2019-07-06 DIAGNOSIS — I10 Essential (primary) hypertension: Secondary | ICD-10-CM | POA: Diagnosis not present

## 2019-07-06 DIAGNOSIS — E782 Mixed hyperlipidemia: Secondary | ICD-10-CM

## 2019-07-06 HISTORY — PX: MELANOMA EXCISION: SHX5266

## 2019-07-06 LAB — POCT GLYCOSYLATED HEMOGLOBIN (HGB A1C): Hemoglobin A1C: 6.6 % — AB (ref 4.0–5.6)

## 2019-07-06 MED ORDER — BASAGLAR KWIKPEN 100 UNIT/ML ~~LOC~~ SOPN: 50.0000 [IU] | PEN_INJECTOR | Freq: Every day | SUBCUTANEOUS | 2 refills | Status: DC

## 2019-07-06 MED ORDER — METFORMIN HCL 1000 MG PO TABS: 1000.0000 mg | ORAL_TABLET | Freq: Two times a day (BID) | ORAL | 0 refills | Status: DC

## 2019-07-06 NOTE — Telephone Encounter (Signed)
Pt needs refill on metFORMIN (GLUCOPHAGE) 1000 MG tablet and sent to [facility]

## 2019-07-06 NOTE — Patient Instructions (Signed)

## 2019-07-06 NOTE — Telephone Encounter (Signed)
Rx refill sent to [facility]. 

## 2019-07-06 NOTE — Progress Notes (Signed)
07/06/2019    Endocrinology follow-up note   Subjective:    Patient ID: Xavier Caldwell, male    DOB: [birth year],    Past Medical History:  Diagnosis Date  . Acute systolic (congestive) heart failure ([place])   . Anxiety   . Arthritis   . Atrial fibrillation ([place])   . Back pain   . Coronary artery disease   . Diabetes mellitus without complication ([place])   . High cholesterol   . History of gout   . Hypertension   . Myocardial infarction ([place]) 04/2018  . Neuropathy   . 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 Sleep apnea    Stop Bang score of 4  . STEMI (ST elevation myocardial infarction) ([place])    04/08/2018 PCI/DES RCA  . VT (ventricular tachycardia) ([place])    Past Surgical History:  Procedure Laterality Date  . APPENDECTOMY    . CARDIOVERSION N/A 04/13/2018   Procedure: CARDIOVERSION;  Surgeon: Skeet Latch, MD;  Location: [facility];  Service: Cardiovascular;  Laterality: N/A;  . CATARACT EXTRACTION W/PHACO Right 04/25/2014   Procedure: CATARACT EXTRACTION PHACO AND INTRAOCULAR LENS PLACEMENT ([place]);  Surgeon: [place] Che, MD;  [facility];  Service: Ophthalmology;  Laterality: Right;  CDE:4.70  . CATARACT EXTRACTION W/PHACO Left 07/11/2014   Procedure: CATARACT EXTRACTION PHACO AND INTRAOCULAR LENS PLACEMENT LEFT EYE CDE=8.32;  Surgeon: [place] Che, MD;  [facility];  Service: Ophthalmology;  Laterality: Left;  . COLONOSCOPY WITH PROPOFOL N/A 09/23/2016   Procedure: COLONOSCOPY WITH PROPOFOL;  Surgeon: Daneil Dolin, MD;  [facility];  Service: Endoscopy;  Laterality: N/A;  . CORONARY/GRAFT ACUTE MI REVASCULARIZATION N/A 04/07/2018   Procedure: Coronary/Graft Acute MI Revascularization;  Surgeon: Burnell Blanks, MD;  Location: [facility];  Service: Cardiovascular;  Laterality: N/A;  . LEFT HEART CATH AND CORONARY ANGIOGRAPHY N/A 04/07/2018   Procedure: LEFT HEART CATH AND CORONARY ANGIOGRAPHY;  Surgeon:  Burnell Blanks, MD;  Location: [facility];  Service: Cardiovascular;  Laterality: N/A;  . POLYPECTOMY  09/23/2016   Procedure: POLYPECTOMY;  Surgeon: Daneil Dolin, MD;  [facility];  Service: Endoscopy;;  colon  . RETINAL DETACHMENT SURGERY     Social History   Socioeconomic History  . Marital status: Married    Spouse name: Not on file  . Number of children: Not on file  . Years of education: Not on file  . Highest education level: Not on file  Occupational History  . Not on file  Tobacco Use  . Smoking status: Former Smoker    Packs/day: 1.50    Years: 30.00    Pack years: 45.00    Types: Cigarettes    Quit date: 07/05/2012    Years since quitting: 7.0  . Smokeless tobacco: Never Used  Substance and Sexual Activity  . Alcohol use: No  . Drug use: No  . Sexual activity: Not Currently    Birth control/protection: None  Other Topics Concern  . Not on file  Social History Narrative  . Not on file   Social Determinants of Health   Financial Resource Strain:   . Difficulty of Paying Living Expenses:   Food Insecurity:   . Worried About Charity fundraiser in the Last Year:   . Arboriculturist in the Last Year:   Transportation Needs:   . Film/video editor (Medical):   Marland Kitchen  Lack of Transportation (Non-Medical):   Physical Activity:   . Days of Exercise per Week:   . Minutes of Exercise per Session:   Stress:   . Feeling of Stress :   Social Connections:   . Frequency of Communication with Friends and Family:   . Frequency of Social Gatherings with Friends and Family:   . Attends Religious Services:   . Active Member of Clubs or Organizations:   . Attends Archivist Meetings:   Marland Kitchen Marital Status:    Outpatient Encounter Medications as of 07/06/2019  Medication Sig  . ALPRAZolam (XANAX) 0.5 MG tablet Take 0.5 mg by mouth 2 (two) times daily as needed for anxiety.  Marland Kitchen apixaban (ELIQUIS) 5 MG TABS tablet Take 1 tablet (5 mg  total) by mouth 2 (two) times daily.  Marland Kitchen atorvastatin (LIPITOR) 40 MG tablet TAKE 1 TABLET BY MOUTH ONCE DAILY 6 IN THE EVENING  . B Complex-C (SUPER B COMPLEX PO) Take 1 tablet by mouth daily.   . carvedilol (COREG) 3.125 MG tablet TAKE ONE TABLET BY MOUTH EVERY MORNING and TAKE ONE TABLET BY MOUTH EVERY EVENING with meals  . clopidogrel (PLAVIX) 75 MG tablet TAKE 1 TABLET BY MOUTH EVERY DAY  . DULoxetine (CYMBALTA) 60 MG capsule Take 60 mg by mouth daily with lunch.   . ergocalciferol (VITAMIN D2) 50000 units capsule Take 50,000 Units by mouth every Tuesday.   . gabapentin (NEURONTIN) 300 MG capsule   . glucose blood (ONETOUCH ULTRA) test strip USE ONE TEST STRIP AS DIRECTED TWICE DAILY  . HYDROcodone-acetaminophen (NORCO/VICODIN) 5-325 MG per tablet Take 1 tablet by mouth every 6 (six) hours as needed for moderate pain.  . Insulin Glargine (BASAGLAR KWIKPEN) 100 UNIT/ML Inject 0.5 mLs (50 Units total) into the skin at bedtime.  Marland Kitchen lisinopril (ZESTRIL) 40 MG tablet Take 1 tablet (40 mg total) by mouth daily.  . metFORMIN (GLUCOPHAGE) 1000 MG tablet TAKE 1 TABLET BY MOUTH TWICE DAILY  . nitroGLYCERIN (NITROSTAT) 0.4 MG SL tablet Place 1 tablet (0.4 mg total) under the tongue every 5 (five) minutes as needed.  Marland Kitchen rOPINIRole (REQUIP) 1 MG tablet Take 1 mg by mouth 3 (three) times daily.  . timolol (TIMOPTIC) 0.5 % ophthalmic solution Place 1 drop into both eyes 2 (two) times daily.   Marland Kitchen UNIFINE PENTIPS 31G X 5 MM MISC USE TO INJECT INSULIN AT BEDTIME (Patient taking differently: at bedtime. )  . [DISCONTINUED] Insulin Glargine (BASAGLAR KWIKPEN) 100 UNIT/ML INJECT 0.6MLS (60 UNITS TOTAL) INTO THE SKIN AT BEDTIME   No facility-administered encounter medications on file as of 07/06/2019.   ALLERGIES: No Known Allergies VACCINATION STATUS: Immunization History  Administered Date(s) Administered  . Influenza, High Dose Seasonal PF 10/21/2017    Diabetes He presents for his follow-up (Telephone  visit) diabetic visit. He has type 2 diabetes mellitus. His disease course has been improving. There are no hypoglycemic associated symptoms. Pertinent negatives for hypoglycemia include no confusion, headaches, pallor or seizures. Pertinent negatives for diabetes include no chest pain, no fatigue, no polydipsia, no polyphagia, no polyuria and no weakness. There are no hypoglycemic complications. Symptoms are improving. Diabetic complications include heart disease. Risk factors for coronary artery disease include diabetes mellitus, hypertension and tobacco exposure. Current diabetic treatment includes insulin injections and oral agent (monotherapy). He is compliant with treatment most of the time. His weight is fluctuating minimally. He is following a generally unhealthy diet. When asked about meal planning, he reported none. He has had a  previous visit with a dietitian. He participates in exercise intermittently. His home blood glucose trend is decreasing steadily. His breakfast blood glucose range is generally 110-130 mg/dl. His bedtime blood glucose range is generally 140-180 mg/dl. His overall blood glucose range is 140-180 mg/dl. An ACE inhibitor/angiotensin II receptor blocker is being taken.  Hyperlipidemia This is a chronic problem. The current episode started more than 1 year ago. The problem is controlled. Exacerbating diseases include diabetes. Pertinent negatives include no chest pain, myalgias or shortness of breath. Current antihyperlipidemic treatment includes statins. Risk factors for coronary artery disease include dyslipidemia, diabetes mellitus, hypertension, male sex and a sedentary lifestyle.  Hypertension This is a chronic problem. The current episode started more than 1 year ago. Pertinent negatives include no chest pain, headaches, neck pain, palpitations or shortness of breath. Risk factors for coronary artery disease include diabetes mellitus, dyslipidemia, male gender, sedentary  lifestyle and smoking/tobacco exposure.    Review of systems  Constitutional: + Minimally fluctuating body weight,  current  Body mass index is 29.46 kg/m. , no fatigue, no subjective hyperthermia, no subjective hypothermia Eyes: no blurry vision, no xerophthalmia ENT: no sore throat, no nodules palpated in throat, no dysphagia/odynophagia, no hoarseness Cardiovascular: no Chest Pain, no Shortness of Breath, no palpitations, no leg swelling Respiratory: no cough, no shortness of breath Gastrointestinal: no Nausea/Vomiting/Diarhhea Musculoskeletal: no muscle/joint aches Skin: no rashes, no hyperemia Neurological: no tremors, no numbness, no tingling, no dizziness Psychiatric: no depression, no anxiety    Objective:    BP (!) 144/77   Pulse 64   Ht 5\' 11"  (1.803 m)   Wt 211 lb 3.2 oz (95.8 kg)   BMI 29.46 kg/m   Wt Readings from Last 3 Encounters:  07/06/19 211 lb 3.2 oz (95.8 kg)  06/08/19 212 lb (96.2 kg)  04/06/19 210 lb (95.3 kg)     Physical Exam- Limited  Constitutional:  Body mass index is 29.46 kg/m. , not in acute distress, normal state of mind Eyes:  EOMI, no exophthalmos Neck: Supple Respiratory: Adequate breathing efforts Musculoskeletal: no gross deformities, strength intact in all four extremities, no gross restriction of joint movements, + disequilibrium, walks with a cane. Skin:  no rashes, no hyperemia Neurological: no tremor with outstretched hands.    Results for orders placed or performed in visit on 07/06/19  HgB A1c  Result Value Ref Range   Hemoglobin A1C 6.6 (A) 4.0 - 5.6 %   HbA1c POC (<> result, manual entry)     HbA1c, POC (prediabetic range)     HbA1c, POC (controlled diabetic range)     Diabetic Labs (most recent): Lab Results  Component Value Date   HGBA1C 6.6 (A) 07/06/2019   HGBA1C 7.5 (A) 03/04/2019   HGBA1C 7.3 (H) 10/22/2018   Lipid Panel     Component Value Date/Time   CHOL 107 04/17/2018 0829   TRIG 92 04/17/2018 0829    HDL 32 (L) 04/17/2018 0829   CHOLHDL 4.4 04/07/2018 1349   VLDL 18 04/07/2018 1349   LDLCALC 57 04/17/2018 0829    Assessment & Plan:   1. Uncontrolled type 2 diabetes mellitus complicated by coronary artery disease.    -He presents with controlled fasting glycemic profile, as well as postprandial glycemic readings.  His point-of-care A1c 6.6%, improved from 7.5%.    He made significant changes in his diet, lost 8 pounds since last visit. I discussed his recent labs with him.   -He remains at extremely  high risk for  more  acute and chronic complications of diabetes which include CAD, CVA, CKD, retinopathy, and neuropathy. These are all discussed in detail with the patient.  -Based on his treatment response, he will not need prandial insulin for now, advised to increase Basaglar to 50 units nightly, associated with monitoring of blood glucose twice a day-daily before breakfast and at bedtime.    -He is encouraged to call clinic if blood glucose readings are below 70 or above 2003. -He is advised to continue metformin 1000 mg p.o. twice daily--daily with breakfast and supper.   2) BP/HTN: His blood pressure is not controlled to target.  He is advised to continue his current blood pressure medications including lisinopril 40 mg p.o. daily.  He also has carvedilol 3.125 mg p.o. twice daily.  3) Lipids/HPL: His recent lipid panel showed controlled LDL at 57.  He is advised to continue simvastatin 20 mg p.o. nightly.     He is advised to maintain close follow-up with his PMD, Dr. Gerarda Fraction.  - he  admits there is a room for improvement in his diet and drink choices. -  Suggestion is made for him to avoid simple carbohydrates  from his diet including Cakes, Sweet Desserts / Pastries, Ice Cream, Soda (diet and regular), Sweet Tea, Candies, Chips, Cookies, Sweet Pastries,  Store Bought Juices, Alcohol in Excess of  1-2 drinks a day, Artificial Sweeteners, Coffee Creamer, and "Sugar-free"  Products. This will help patient to have stable blood glucose profile and potentially avoid unintended weight gain.    Follow up plan: Return in about 4 months (around 11/05/2019) for Bring Meter and Logs- A1c in Office.  Glade Lloyd, MD Phone: [phone]  Fax: [phone]  This note was partially dictated with voice recognition software. Similar sounding words can be transcribed inadequately or may not  be corrected upon review.  07/06/2019, 9:47 AM

## 2019-07-12 ENCOUNTER — Other Ambulatory Visit: Payer: Self-pay | Admitting: Cardiology

## 2019-07-13 DIAGNOSIS — D0339 Melanoma in situ of other parts of face: Secondary | ICD-10-CM | POA: Diagnosis not present

## 2019-07-16 DIAGNOSIS — R69 Illness, unspecified: Secondary | ICD-10-CM | POA: Diagnosis not present

## 2019-07-19 DIAGNOSIS — L03031 Cellulitis of right toe: Secondary | ICD-10-CM | POA: Diagnosis not present

## 2019-07-19 DIAGNOSIS — M79674 Pain in right toe(s): Secondary | ICD-10-CM | POA: Diagnosis not present

## 2019-07-19 DIAGNOSIS — M79671 Pain in right foot: Secondary | ICD-10-CM | POA: Diagnosis not present

## 2019-07-19 DIAGNOSIS — L600 Ingrowing nail: Secondary | ICD-10-CM | POA: Diagnosis not present

## 2019-07-21 ENCOUNTER — Encounter: Payer: Self-pay | Admitting: Internal Medicine

## 2019-08-03 DIAGNOSIS — D0339 Melanoma in situ of other parts of face: Secondary | ICD-10-CM | POA: Diagnosis not present

## 2019-08-04 DIAGNOSIS — I5023 Acute on chronic systolic (congestive) heart failure: Secondary | ICD-10-CM | POA: Diagnosis not present

## 2019-08-04 DIAGNOSIS — N181 Chronic kidney disease, stage 1: Secondary | ICD-10-CM | POA: Diagnosis not present

## 2019-08-04 DIAGNOSIS — E1122 Type 2 diabetes mellitus with diabetic chronic kidney disease: Secondary | ICD-10-CM | POA: Diagnosis not present

## 2019-08-04 DIAGNOSIS — I130 Hypertensive heart and chronic kidney disease with heart failure and stage 1 through stage 4 chronic kidney disease, or unspecified chronic kidney disease: Secondary | ICD-10-CM | POA: Diagnosis not present

## 2019-08-10 ENCOUNTER — Other Ambulatory Visit: Payer: Self-pay | Admitting: Cardiology

## 2019-08-10 DIAGNOSIS — M79674 Pain in right toe(s): Secondary | ICD-10-CM | POA: Diagnosis not present

## 2019-08-10 DIAGNOSIS — M79671 Pain in right foot: Secondary | ICD-10-CM | POA: Diagnosis not present

## 2019-08-10 DIAGNOSIS — M79672 Pain in left foot: Secondary | ICD-10-CM | POA: Diagnosis not present

## 2019-08-10 DIAGNOSIS — L600 Ingrowing nail: Secondary | ICD-10-CM | POA: Diagnosis not present

## 2019-08-10 DIAGNOSIS — M79675 Pain in left toe(s): Secondary | ICD-10-CM | POA: Diagnosis not present

## 2019-08-12 DIAGNOSIS — D0339 Melanoma in situ of other parts of face: Secondary | ICD-10-CM | POA: Diagnosis not present

## 2019-08-12 DIAGNOSIS — L989 Disorder of the skin and subcutaneous tissue, unspecified: Secondary | ICD-10-CM | POA: Diagnosis not present

## 2019-08-12 DIAGNOSIS — L905 Scar conditions and fibrosis of skin: Secondary | ICD-10-CM | POA: Diagnosis not present

## 2019-08-19 ENCOUNTER — Other Ambulatory Visit (INDEPENDENT_AMBULATORY_CARE_PROVIDER_SITE_OTHER): Payer: Self-pay | Admitting: Ophthalmology

## 2019-08-25 ENCOUNTER — Telehealth: Payer: Self-pay | Admitting: *Deleted

## 2019-08-25 NOTE — Telephone Encounter (Signed)
Call to notify that pt assistance Eliquis has arrived in office. No answer left msg to call back.

## 2019-08-27 DIAGNOSIS — R69 Illness, unspecified: Secondary | ICD-10-CM | POA: Diagnosis not present

## 2019-08-31 ENCOUNTER — Ambulatory Visit: Payer: Medicare HMO | Admitting: Cardiology

## 2019-08-31 ENCOUNTER — Encounter: Payer: Self-pay | Admitting: Cardiology

## 2019-08-31 VITALS — BP 158/82 | HR 82 | Ht 71.0 in | Wt 213.0 lb

## 2019-08-31 DIAGNOSIS — I2510 Atherosclerotic heart disease of native coronary artery without angina pectoris: Secondary | ICD-10-CM | POA: Diagnosis not present

## 2019-08-31 DIAGNOSIS — I480 Paroxysmal atrial fibrillation: Secondary | ICD-10-CM

## 2019-08-31 DIAGNOSIS — I10 Essential (primary) hypertension: Secondary | ICD-10-CM | POA: Diagnosis not present

## 2019-08-31 MED ORDER — CARVEDILOL 6.25 MG PO TABS
6.2500 mg | ORAL_TABLET | Freq: Two times a day (BID) | ORAL | 3 refills | Status: DC
Start: 2019-08-31 — End: 2020-01-04

## 2019-08-31 MED ORDER — ATORVASTATIN CALCIUM 80 MG PO TABS
80.0000 mg | ORAL_TABLET | Freq: Every day | ORAL | 3 refills | Status: DC
Start: 2019-08-31 — End: 2020-08-29

## 2019-08-31 NOTE — Patient Instructions (Addendum)
Medication Instructions:    Your physician has recommended you make the following change in your medication:   Stay off plavix  Increase carvedilol to 6.25 mg twice daily  Increase atorvastatin to 80 mg daily  Continue other medications the same  Labwork:  NONE  Testing/Procedures:  NONE  Follow-Up:  Your physician recommends that you schedule a follow-up appointment in: 4 months.  Any Other Special Instructions Will Be Listed Below (If Applicable).  If you need a refill on your cardiac medications before your next appointment, please call your pharmacy.

## 2019-08-31 NOTE — Progress Notes (Signed)
Clinical Summary Mr. Petrucelli is a 69 y.o.male seen today for follow up of the following medical problem.s   1. CAD - admit 04/2018 with inferior STEMI - received DES to RCA. Complicated VT arrest requiring defib in the cath lab and by bradycardia, required temp pacer. Beta blocker not started due to bradycardia Due to afib was on triple therapy ASA/plavix/DOAC - 04/2018 echo LVEF 45-50%,    - mild nonspecific twinge like pain in chest.    2. Afib - new diagnosis during 04/2018 admission with STEMI - management complicated by bradycardia - was on admiodarone, s/p DCCV. Amiodarone was discontinued at outpatient f/u - started on eliquis    - no palpitations - no bleeding on eliquis  3. HTN - compliant with meds Home bp's 130-150/60-70s  4. Hyperlipemia - 04/2018 TC 107 TG 92 HDL 32 LDL 57 - compliant with statin - labs followed by endocirine  06/2019 TC 132 TG 115 HDL 34 LDL 77   5. Carotid stenosis - followed by vascular    SH; has had covid vaccine Past Medical History:  Diagnosis Date  . Acute systolic (congestive) heart failure ([place])   . Anxiety   . Arthritis   . Atrial fibrillation ([place])   . Back pain   . Coronary artery disease   . Diabetes mellitus without complication ([place])   . High cholesterol   . History of gout   . Hypertension   . Myocardial infarction ([place]) 04/2018  . Neuropathy   . 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 Sleep apnea    Stop Bang score of 4  . STEMI (ST elevation myocardial infarction) ([place])    04/08/2018 PCI/DES RCA  . VT (ventricular tachycardia) ([facility])      No Known Allergies   Current Outpatient Medications  Medication Sig Dispense Refill  . ALPRAZolam (XANAX) 0.5 MG tablet Take 0.5 mg by mouth 2 (two) times daily as needed for anxiety.    Marland Kitchen apixaban (ELIQUIS) 5 MG TABS tablet Take 1 tablet (5 mg total) by mouth 2 (two) times daily. 60 tablet 6  . atorvastatin (LIPITOR) 40 MG tablet TAKE  ONE TABLET BY MOUTH EVERYDAY AT BEDTIME 30 tablet 8  . B Complex-C (SUPER B COMPLEX PO) Take 1 tablet by mouth daily.     . carvedilol (COREG) 3.125 MG tablet TAKE ONE TABLET BY MOUTH EVERY MORNING and TAKE ONE TABLET BY MOUTH EVERY EVENING with meals 60 tablet 6  . clopidogrel (PLAVIX) 75 MG tablet TAKE ONE TABLET BY MOUTH EVERY EVENING 90 tablet 1  . DULoxetine (CYMBALTA) 60 MG capsule Take 60 mg by mouth daily with lunch.     . ergocalciferol (VITAMIN D2) 50000 units capsule Take 50,000 Units by mouth every Tuesday.     . gabapentin (NEURONTIN) 300 MG capsule     . glucose blood (ONETOUCH ULTRA) test strip USE ONE TEST STRIP AS DIRECTED TWICE DAILY 100 each 0  . HYDROcodone-acetaminophen (NORCO/VICODIN) 5-325 MG per tablet Take 1 tablet by mouth every 6 (six) hours as needed for moderate pain.    . Insulin Glargine (BASAGLAR KWIKPEN) 100 UNIT/ML Inject 0.5 mLs (50 Units total) into the skin at bedtime. 10 pen 2  . lisinopril (ZESTRIL) 40 MG tablet Take 1 tablet (40 mg total) by mouth daily. 90 tablet 3  . metFORMIN (GLUCOPHAGE) 1000 MG tablet Take 1 tablet (1,000 mg total) by mouth 2 (two) times daily. [place]  tablet 0  . nitroGLYCERIN (NITROSTAT) 0.4 MG SL tablet Place 1 tablet (0.4 mg total) under the tongue every 5 (five) minutes as needed. 25 tablet 2  . rOPINIRole (REQUIP) 1 MG tablet Take 1 mg by mouth 3 (three) times daily.    . timolol (TIMOPTIC) 0.5 % ophthalmic solution INSTILL 1 DROP INTO EACH EYE TWICE DAILY 10 mL 10  . UNIFINE PENTIPS 31G X 5 MM MISC USE TO INJECT INSULIN AT BEDTIME (Patient taking differently: at bedtime. ) 100 each 5   No current facility-administered medications for this visit.     Past Surgical History:  Procedure Laterality Date  . APPENDECTOMY    . CARDIOVERSION N/A 04/13/2018   Procedure: CARDIOVERSION;  Surgeon: Skeet Latch, MD;  Location: [facility];  Service: Cardiovascular;  Laterality: N/A;  . CATARACT EXTRACTION W/PHACO Right 04/25/2014    Procedure: CATARACT EXTRACTION PHACO AND INTRAOCULAR LENS PLACEMENT ([place]);  Surgeon: Williams Che, MD;  [facility];  Service: Ophthalmology;  Laterality: Right;  CDE:4.70  . CATARACT EXTRACTION W/PHACO Left 07/11/2014   Procedure: CATARACT EXTRACTION PHACO AND INTRAOCULAR LENS PLACEMENT LEFT EYE CDE=8.32;  Surgeon: Williams Che, MD;  [facility]: [facility];  Service: Ophthalmology;  Laterality: Left;  . COLONOSCOPY WITH PROPOFOL N/A 09/23/2016   Procedure: COLONOSCOPY WITH PROPOFOL;  Surgeon: Daneil Dolin, MD;  [facility];  Service: Endoscopy;  Laterality: N/A;  . CORONARY/GRAFT ACUTE MI REVASCULARIZATION N/A 04/07/2018   Procedure: Coronary/Graft Acute MI Revascularization;  Surgeon: Burnell Blanks, MD;  Location: [facility];  Service: Cardiovascular;  Laterality: N/A;  . LEFT HEART CATH AND CORONARY ANGIOGRAPHY N/A 04/07/2018   Procedure: LEFT HEART CATH AND CORONARY ANGIOGRAPHY;  Surgeon: Burnell Blanks, MD;  Location: [facility];  Service: Cardiovascular;  Laterality: N/A;  . POLYPECTOMY  09/23/2016   Procedure: POLYPECTOMY;  Surgeon: Daneil Dolin, MD;  [facility];  Service: Endoscopy;;  colon  . RETINAL DETACHMENT SURGERY       No Known Allergies    Family History  Problem Relation Age of Onset  . Hypertension Father   . Colon cancer Neg Hx      Social History Mr. Serna reports that he quit smoking about 7 years ago. His smoking use included cigarettes. He has a 45.00 pack-year smoking history. He has never used smokeless tobacco. Mr. Cookson reports no history of alcohol use.   Review of Systems CONSTITUTIONAL: No weight loss, fever, chills, weakness or fatigue.  HEENT: Eyes: No visual loss, blurred vision, double vision or yellow sclerae.No hearing loss, sneezing, congestion, runny nose or sore throat.  SKIN: No rash or itching.  CARDIOVASCULAR: per hpi RESPIRATORY: No shortness of breath, cough or sputum.    GASTROINTESTINAL: No anorexia, nausea, vomiting or diarrhea. No abdominal pain or blood.  GENITOURINARY: No burning on urination, no polyuria NEUROLOGICAL: No headache, dizziness, syncope, paralysis, ataxia, numbness or tingling in the extremities. No change in bowel or bladder control.  MUSCULOSKELETAL: No muscle, back pain, joint pain or stiffness.  LYMPHATICS: No enlarged nodes. No history of splenectomy.  PSYCHIATRIC: No history of depression or anxiety.  ENDOCRINOLOGIC: No reports of sweating, cold or heat intolerance. No polyuria or polydipsia.  Marland Kitchen   Physical Examination Today's Vitals   08/31/19 1434  BP: (!) 158/82  Pulse: 82  SpO2: 98%  Weight: (!) 213 lb (96.6 kg)  Height: 5\' 11"  (1.803 m)   Body mass index is 29.71 kg/m.  Gen: resting  comfortably, no acute distress HEENT: no scleral icterus, pupils equal round and reactive, no palptable cervical adenopathy,  CV: RRR, no m/r/ no jvd Resp: Clear to auscultation bilaterally GI: abdomen is soft, non-tender, non-distended, normal bowel sounds, no hepatosplenomegaly MSK: extremities are warm, no edema.  Skin: warm, no rash Neuro:  no focal deficits Psych: appropriate affect   Diagnostic Studies Cath: 04/07/2018   Prox RCA lesion is 100% stenosed.  Ost LAD to Prox LAD lesion is 30% stenosed.  Mid LAD lesion is 40% stenosed.  Ost 2nd Mrg to 2nd Mrg lesion is 40% stenosed.  A drug-eluting stent was successfully placed using a STENT RESOLUTE ONYX 3.5X30.  Post intervention, there is a 0% residual stenosis.  1. 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 Ventricular fibrillation/cardiac arrest with successful resuscitation 3. Successful PTCA/DES x 1 proximal RCA.  4. Successful aspiration thrombectomy distal RCA 5. Mild to moderate non-obstructive disease in the Circumflex and LAD  Recommendations: Will admit to ICU. Echo in the am. Will continue Aggrastat for 18 hours. Will  continue ASA/Brilinta and statin. Will not start a beta blocker today given hypotension and bradycardia. Ace inh on hold with hypotension.  TTE: 04/08/2018  IMPRESSIONS   1. The left ventricle has mildly reduced systolic function, with an ejection fraction of 45-50%. The cavity size was normal. There is moderately increased left ventricular wall thickness. 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 Left ventricular diffuse hypokinesis. 2. The right ventricle has mildly reduced systolic function. The cavity was mildly enlarged. There is no increase in right ventricular wall thickness. 3. The mitral valve is degenerative. There is mild mitral annular calcification present. 4. The aortic valve was not well visualized. 5. The aortic root and ascending aorta are normal in size and structure. 6. The inferior vena cava was dilated in size with <50% respiratory variability. 7. Severe hypokinesis of the left ventricular basal to mid inferior and inferoseptal. 8. The interatrial septum was not well visualized.    Assessment and Plan  1. CAD  Continue current meds, increase atorva to 80mg  dialy - continue current meds  2. PAF - increase coreg to 6.25mg  bid  3. HTN -above goal, increase coreg to 6.25mg  bid       Arnoldo Lenis, M.D.

## 2019-09-03 DIAGNOSIS — N181 Chronic kidney disease, stage 1: Secondary | ICD-10-CM | POA: Diagnosis not present

## 2019-09-03 DIAGNOSIS — E1122 Type 2 diabetes mellitus with diabetic chronic kidney disease: Secondary | ICD-10-CM | POA: Diagnosis not present

## 2019-09-03 DIAGNOSIS — I130 Hypertensive heart and chronic kidney disease with heart failure and stage 1 through stage 4 chronic kidney disease, or unspecified chronic kidney disease: Secondary | ICD-10-CM | POA: Diagnosis not present

## 2019-09-03 DIAGNOSIS — I5023 Acute on chronic systolic (congestive) heart failure: Secondary | ICD-10-CM | POA: Diagnosis not present

## 2019-09-09 ENCOUNTER — Ambulatory Visit: Payer: Medicare HMO | Admitting: Cardiology

## 2019-09-14 ENCOUNTER — Ambulatory Visit (HOSPITAL_COMMUNITY)
Admission: RE | Admit: 2019-09-14 | Discharge: 2019-09-14 | Disposition: A | Discharge status: Home / Self Care | Payer: Medicare HMO | Source: Ambulatory Visit | Attending: Vascular Surgery | Admitting: Vascular Surgery

## 2019-09-14 ENCOUNTER — Encounter: Payer: Self-pay | Admitting: Vascular Surgery

## 2019-09-14 ENCOUNTER — Other Ambulatory Visit: Payer: Self-pay

## 2019-09-14 ENCOUNTER — Ambulatory Visit: Payer: Medicare HMO | Admitting: Vascular Surgery

## 2019-09-14 ENCOUNTER — Ambulatory Visit (INDEPENDENT_AMBULATORY_CARE_PROVIDER_SITE_OTHER)
Admission: RE | Admit: 2019-09-14 | Discharge: 2019-09-14 | Disposition: A | Discharge status: Home / Self Care | Payer: Medicare HMO | Source: Ambulatory Visit | Attending: Vascular Surgery | Admitting: Vascular Surgery

## 2019-09-14 VITALS — BP 147/81 | HR 66 | Temp 98.0°F | Resp 20 | Ht 71.0 in | Wt 211.0 lb

## 2019-09-14 DIAGNOSIS — I6523 Occlusion and stenosis of bilateral carotid arteries: Secondary | ICD-10-CM

## 2019-09-14 DIAGNOSIS — I739 Peripheral vascular disease, unspecified: Secondary | ICD-10-CM | POA: Diagnosis not present

## 2019-09-14 NOTE — Progress Notes (Signed)
Patient name: Xavier Caldwell MRN: [id number] DOB: 11-20-1950 Sex: male  REASON FOR VISIT: 6 month follow-up for surveillance of carotid artery disease and PVD  HPI: Xavier Caldwell is a 69 y.o. male with history of coronary disease status post NSTEMI complicated by V. tach arrest, A. fib on Eliquis, asymptomatic carotid stenosis, HTN, HLD that presents for 6 month follow-up of his PVD and carotid artery disease.    As it related to his carotid arteries, patient was being followed by his cardiologist Dr. Harl Bowie who noted a carotid bruit and subsequently got carotid duplex revealing greater than 70% stenosis bilaterally.  Repeat duplex is in our office in February of this year showed 60 to 79% stenosis on the right and 60 to 79% stenosis on the left.  He has remained asymptomatic and we have been following him.  He reports no new neurologic symptoms or history of TIA or strokes over the last 6 months today.  We have also previously evaluated him for PVD.  He reports his legs are actually slightly improved at this visit.  Main complaint before was legs feeling restless at night.  He is walking with a cane.  He was able to mow his yard earlier this week and did have some claudication symptoms in the right leg.  No rest pain at this time.   Past Medical History:  Diagnosis Date  . Acute systolic (congestive) heart failure ([place])   . Anxiety   . Arthritis   . Atrial fibrillation ([place])   . Back pain   . Coronary artery disease   . Diabetes mellitus without complication ([place])   . High cholesterol   . History of gout   . Hypertension   . Myocardial infarction ([place]) 04/2018  . Neuropathy   . 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 Sleep apnea    Stop Bang score of 4  . STEMI (ST elevation myocardial infarction) ([name])    04/08/2018 PCI/DES RCA  . VT (ventricular tachycardia) ([place])     Past Surgical History:  Procedure Laterality Date  . APPENDECTOMY    . CARDIOVERSION N/A  04/13/2018   Procedure: CARDIOVERSION;  Surgeon: Skeet Latch, MD;  Location: [facility];  Service: Cardiovascular;  Laterality: N/A;  . CATARACT EXTRACTION W/PHACO Right 04/25/2014   Procedure: CATARACT EXTRACTION PHACO AND INTRAOCULAR LENS PLACEMENT ([place]);  Surgeon: Williams Che, MD;  [facility];  Service: Ophthalmology;  Laterality: Right;  CDE:4.70  . CATARACT EXTRACTION W/PHACO Left 07/11/2014   Procedure: CATARACT EXTRACTION PHACO AND INTRAOCULAR LENS PLACEMENT LEFT EYE CDE=8.32;  Surgeon: Williams Che, MD;  [facility];  Service: Ophthalmology;  Laterality: Left;  . COLONOSCOPY WITH PROPOFOL N/A 09/23/2016   Procedure: COLONOSCOPY WITH PROPOFOL;  Surgeon: Daneil Dolin, MD;  [facility];  Service: Endoscopy;  Laterality: N/A;  . CORONARY/GRAFT ACUTE MI REVASCULARIZATION N/A 04/07/2018   Procedure: Coronary/Graft Acute MI Revascularization;  Surgeon: Burnell Blanks, MD;  Location: [facility];  Service: Cardiovascular;  Laterality: N/A;  . LEFT HEART CATH AND CORONARY ANGIOGRAPHY N/A 04/07/2018   Procedure: LEFT HEART CATH AND CORONARY ANGIOGRAPHY;  Surgeon: Burnell Blanks, MD;  Location: [facility];  Service: Cardiovascular;  Laterality: N/A;  . POLYPECTOMY  09/23/2016   Procedure: POLYPECTOMY;  Surgeon: Daneil Dolin, MD;  [facility];  Service: Endoscopy;;  colon  . RETINAL DETACHMENT SURGERY  Family History  Problem Relation Age of Onset  . Hypertension Father   . Colon cancer Neg Hx     SOCIAL HISTORY: Social History   Tobacco Use  . Smoking status: Former Smoker    Packs/day: 1.50    Years: 30.00    Pack years: 45.00    Types: Cigarettes    Quit date: 07/05/2012    Years since quitting: 7.1  . Smokeless tobacco: Never Used  Substance Use Topics  . Alcohol use: No    No Known Allergies  Current Outpatient Medications  Medication Sig Dispense Refill  . ALPRAZolam (XANAX) 0.5 MG tablet  Take 0.5 mg by mouth 2 (two) times daily as needed for anxiety.    Marland Kitchen apixaban (ELIQUIS) 5 MG TABS tablet Take 1 tablet (5 mg total) by mouth 2 (two) times daily. 60 tablet 6  . atorvastatin (LIPITOR) 80 MG tablet Take 1 tablet (80 mg total) by mouth daily. 90 tablet 3  . B Complex-C (SUPER B COMPLEX PO) Take 1 tablet by mouth daily.     . carvedilol (COREG) 6.25 MG tablet Take 1 tablet (6.25 mg total) by mouth 2 (two) times daily. 180 tablet 3  . DULoxetine (CYMBALTA) 60 MG capsule Take 60 mg by mouth daily with lunch.     . ergocalciferol (VITAMIN D2) 50000 units capsule Take 50,000 Units by mouth every Tuesday.     . gabapentin (NEURONTIN) 300 MG capsule     . glucose blood (ONETOUCH ULTRA) test strip USE ONE TEST STRIP AS DIRECTED TWICE DAILY 100 each 0  . HYDROcodone-acetaminophen (NORCO/VICODIN) 5-325 MG per tablet Take 1 tablet by mouth every 6 (six) hours as needed for moderate pain.    . Insulin Glargine (BASAGLAR KWIKPEN) 100 UNIT/ML Inject 0.5 mLs (50 Units total) into the skin at bedtime. 10 pen 2  . metFORMIN (GLUCOPHAGE) 1000 MG tablet Take 1 tablet (1,000 mg total) by mouth 2 (two) times daily. 180 tablet 0  . nitroGLYCERIN (NITROSTAT) 0.4 MG SL tablet Place 1 tablet (0.4 mg total) under the tongue every 5 (five) minutes as needed. 25 tablet 2  . rOPINIRole (REQUIP) 1 MG tablet Take 1 mg by mouth in the morning, at noon, in the evening, and at bedtime.     . timolol (TIMOPTIC) 0.5 % ophthalmic solution INSTILL 1 DROP INTO EACH EYE TWICE DAILY 10 mL 10  . UNIFINE PENTIPS 31G X 5 MM MISC USE TO INJECT INSULIN AT BEDTIME (Patient taking differently: at bedtime. ) 100 each 5  . lisinopril (ZESTRIL) 40 MG tablet Take 1 tablet (40 mg total) by mouth daily. 90 tablet 3   No current facility-administered medications for this visit.    REVIEW OF SYSTEMS:  [X]  denotes positive finding, [ ]  denotes negative finding Cardiac  Comments:  Chest pain or chest pressure:    Shortness of breath  upon exertion:    Short of breath when lying flat:    Irregular heart rhythm:        Vascular    Pain in calf, thigh, or hip brought on by ambulation:    Pain in feet at night that wakes you up from your sleep:     Blood clot in your veins:    Leg swelling:         Pulmonary    Oxygen at home:    Productive cough:     Wheezing:         Neurologic    Sudden weakness in  arms or legs:     Sudden numbness in arms or legs:     Sudden onset of difficulty speaking or slurred speech:    Temporary loss of vision in one eye:     Problems with dizziness:         Gastrointestinal    Blood in stool:     Vomited blood:         Genitourinary    Burning when urinating:     Blood in urine:        Psychiatric    Major depression:         Hematologic    Bleeding problems:    Problems with blood clotting too easily:        Skin    Rashes or ulcers:        Constitutional    Fever or chills:      PHYSICAL EXAM: Vitals:   09/14/19 1017  BP: (!) 147/81  Pulse: 66  Resp: 20  Temp: 98 F (36.7 C)  SpO2: 100%  Weight: 211 lb (95.7 kg)  Height: 5\' 11"  (1.803 m)    GENERAL: The patient is a well-nourished male, in no acute distress. The vital signs are documented above. CARDIAC: There is a regular rate and rhythm.  VASCULAR:  Palpable femoral pulses bilaterally Right foot warm, non-palpable pulses Left foot palpable DP No active wounds PULMONARY: There is good air exchange bilaterally without wheezing or rales. ABDOMEN: Soft and non-tender with normal pitched bowel sounds.  MUSCULOSKELETAL: There are no major deformities or cyanosis. NEUROLOGIC: No focal weakness or paresthesias are detected.  CN II-XII grossly intact.  DATA:   ABIs today are 0.51 on the right monophasic and 0.82 on the left triphasic  Carotid duplex today suggest 60 to 79% stenosis on the right and 40 to 59% stenosis on the left (6 months ago he had 60-79% stenosis bilaterally by velocity  criteria)  Assessment/Plan:  69 year old male who presents for 42-month interval follow-up of his carotid artery disease and peripheral vascular disease.  As it relates to his carotid artery disease duplex today shows 60 to 79% stenosis on the right and 40 to 59% stenosis on the left.  Back in January based on velocity criteria had moderate 60 to 79% range bilaterally.  He continues to remain asymptomatic from his carotid artery disease with no history of TIA or strokes.  Discussed that in the setting of asymptomatic disease would reserve intervention for greater than 80% stenosis.  Will arrange follow-up again in 6 months with carotid duplex for continued surveillance.  As it relates to his lower extremities sounds like he has actually had some improvement in his symptoms.  On further questioning I do think he has claudication in the right leg and his ABIs are lower at 0.51 on the right.  No evidence of critical limb ischemia at this time.  He feels the symptoms are tolerable if not improved.  Will get ABIs again in 6 months when he follows up for his carotid disease and certainly if he develops any worsening symptoms including lifestyle limiting claudication, rest pain or tissue loss would likely require lower extremity arteriogram and intervention.   Marty Heck, MD [facility] Office: [phone]

## 2019-09-15 ENCOUNTER — Other Ambulatory Visit: Payer: Self-pay | Admitting: *Deleted

## 2019-09-15 DIAGNOSIS — I739 Peripheral vascular disease, unspecified: Secondary | ICD-10-CM

## 2019-09-15 DIAGNOSIS — I6523 Occlusion and stenosis of bilateral carotid arteries: Secondary | ICD-10-CM

## 2019-10-05 ENCOUNTER — Other Ambulatory Visit: Payer: Self-pay

## 2019-10-05 ENCOUNTER — Encounter: Payer: Self-pay | Admitting: Gastroenterology

## 2019-10-05 ENCOUNTER — Ambulatory Visit: Payer: Medicare HMO | Admitting: Gastroenterology

## 2019-10-05 VITALS — BP 157/91 | HR 72 | Temp 97.5°F | Ht 71.0 in | Wt 213.2 lb

## 2019-10-05 DIAGNOSIS — Z860100 Personal history of colon polyps, unspecified: Secondary | ICD-10-CM | POA: Insufficient documentation

## 2019-10-05 DIAGNOSIS — K5900 Constipation, unspecified: Secondary | ICD-10-CM

## 2019-10-05 DIAGNOSIS — Z7901 Long term (current) use of anticoagulants: Secondary | ICD-10-CM

## 2019-10-05 DIAGNOSIS — E1122 Type 2 diabetes mellitus with diabetic chronic kidney disease: Secondary | ICD-10-CM | POA: Diagnosis not present

## 2019-10-05 DIAGNOSIS — I130 Hypertensive heart and chronic kidney disease with heart failure and stage 1 through stage 4 chronic kidney disease, or unspecified chronic kidney disease: Secondary | ICD-10-CM | POA: Diagnosis not present

## 2019-10-05 DIAGNOSIS — N181 Chronic kidney disease, stage 1: Secondary | ICD-10-CM | POA: Diagnosis not present

## 2019-10-05 DIAGNOSIS — I5023 Acute on chronic systolic (congestive) heart failure: Secondary | ICD-10-CM | POA: Diagnosis not present

## 2019-10-05 MED ORDER — LINACLOTIDE 145 MCG PO CAPS: 145.0000 ug | ORAL_CAPSULE | Freq: Every day | ORAL | 3 refills | Status: DC

## 2019-10-05 NOTE — Patient Instructions (Addendum)
Colonoscopy to be scheduled. See separate instructions.   Start Linzess 145 mcg daily before breakfast for constipation. Samples provided. RX sent to your pharmacy. If not covered, please let us know and we will look for another option.

## 2019-10-05 NOTE — Progress Notes (Addendum)
Primary Care Physician:  Redmond School, MD  Primary Gastroenterologist:  Garfield Cornea, MD   Chief Complaint  Patient presents with  . Colonoscopy    constipation, small bm's and hard to come out    HPI:  Xavier Caldwell is a 69 y.o. male here to schedule his three year surveillance colonoscopy.  He had 9 polyps removed in 2018, multiple tubular adenomas.  Since we last saw him he had an MI status post stenting in March 7893, complicated by VT arrest requiring defib in the Cath Lab and by bradycardia requiring temporary pacer. Developed A. fib and now on Eliquis. He was diagnosed with melanoma on his nose and has had two surgeries.   Feels urge to go have BM but then has trouble getting stool out. Has to strain to get a very small amount out. Does not feel complete. Had similar symptoms back in 2018 before his colonoscopy. No melena, brbpr. Tried couple of things over the counter. Exlax cramped stomach bad. Last hydrocodone use one month ago, does not take very often. Denies heartburn, dysphagia, vomiting. Feels hungry but feels too full to eat due to constipation. Symptoms worse last couple of months.   Current Outpatient Medications  Medication Sig Dispense Refill  . ALPRAZolam (XANAX) 0.5 MG tablet Take 0.5 mg by mouth 2 (two) times daily as needed for anxiety.    Marland Kitchen apixaban (ELIQUIS) 5 MG TABS tablet Take 1 tablet (5 mg total) by mouth 2 (two) times daily. 60 tablet 6  . atorvastatin (LIPITOR) 80 MG tablet Take 1 tablet (80 mg total) by mouth daily. 90 tablet 3  . B Complex-C (SUPER B COMPLEX PO) Take 1 tablet by mouth daily.     . carvedilol (COREG) 6.25 MG tablet Take 1 tablet (6.25 mg total) by mouth 2 (two) times daily. 180 tablet 3  . DULoxetine (CYMBALTA) 60 MG capsule Take 60 mg by mouth daily with lunch.     . ergocalciferol (VITAMIN D2) 50000 units capsule Take 50,000 Units by mouth every Tuesday.     . gabapentin (NEURONTIN) 300 MG capsule in the morning and at bedtime.     Marland Kitchen  glucose blood (ONETOUCH ULTRA) test strip USE ONE TEST STRIP AS DIRECTED TWICE DAILY 100 each 0  . HYDROcodone-acetaminophen (NORCO/VICODIN) 5-325 MG per tablet Take 1 tablet by mouth every 6 (six) hours as needed for moderate pain.    . Insulin Glargine (BASAGLAR KWIKPEN) 100 UNIT/ML Inject 0.5 mLs (50 Units total) into the skin at bedtime. (Patient taking differently: Inject 50 Units into the skin at bedtime. Takes 35-50 units depending on blood sugar reading.) 10 pen 2  . lisinopril (ZESTRIL) 40 MG tablet Take 1 tablet (40 mg total) by mouth daily. 90 tablet 3  . metFORMIN (GLUCOPHAGE) 1000 MG tablet Take 1 tablet (1,000 mg total) by mouth 2 (two) times daily. 180 tablet 0  . nitroGLYCERIN (NITROSTAT) 0.4 MG SL tablet Place 1 tablet (0.4 mg total) under the tongue every 5 (five) minutes as needed. 25 tablet 2  . rOPINIRole (REQUIP) 1 MG tablet Take 1 mg by mouth in the morning, at noon, in the evening, and at bedtime.     . timolol (TIMOPTIC) 0.5 % ophthalmic solution INSTILL 1 DROP INTO EACH EYE TWICE DAILY 10 mL 10  . UNIFINE PENTIPS 31G X 5 MM MISC USE TO INJECT INSULIN AT BEDTIME (Patient taking differently: at bedtime. ) 100 each 5   No current facility-administered medications for this visit.  Allergies as of 10/05/2019  . (No Known Allergies)    Past Medical History:  Diagnosis Date  . Acute systolic (congestive) heart failure ([place])   . Anxiety   . Arthritis   . Atrial fibrillation ([place])   . Back pain   . Coronary artery disease   . Diabetes mellitus without complication ([place])   . High cholesterol   . History of gout   . Hypertension   . Myocardial infarction ([place]) 04/2018  . Neuropathy   . 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 Sleep apnea    Stop Bang score of 4  . STEMI (ST elevation myocardial infarction) ([place])    04/08/2018 PCI/DES RCA  . VT (ventricular tachycardia) ([place])     Past Surgical History:  Procedure Laterality Date  .  APPENDECTOMY    . CARDIOVERSION N/A 04/13/2018   Procedure: CARDIOVERSION;  Surgeon: Skeet Latch, MD;  Location: [facility];  Service: Cardiovascular;  Laterality: N/A;  . CATARACT EXTRACTION W/PHACO Right 04/25/2014   Procedure: CATARACT EXTRACTION PHACO AND INTRAOCULAR LENS PLACEMENT ([place]);  Surgeon: Williams Che, MD;  Location: [facility];  Service: Ophthalmology;  Laterality: Right;  CDE:4.70  . CATARACT EXTRACTION W/PHACO Left 07/11/2014   Procedure: CATARACT EXTRACTION PHACO AND INTRAOCULAR LENS PLACEMENT LEFT EYE CDE=8.32;  Surgeon: Williams Che, MD;  Location: [facility];  Service: Ophthalmology;  Laterality: Left;  . COLONOSCOPY WITH PROPOFOL N/A 09/23/2016   [place]: 9 polyps removed, multiple tubular adenomas. recommended 3 yr surveillance  . CORONARY/GRAFT ACUTE MI REVASCULARIZATION N/A 04/07/2018   Procedure: Coronary/Graft Acute MI Revascularization;  Surgeon: Burnell Blanks, MD;  Location: [facility];  Service: Cardiovascular;  Laterality: N/A;  . LEFT HEART CATH AND CORONARY ANGIOGRAPHY N/A 04/07/2018   Procedure: LEFT HEART CATH AND CORONARY ANGIOGRAPHY;  Surgeon: Burnell Blanks, MD;  Location: [facility];  Service: Cardiovascular;  Laterality: N/A;  . POLYPECTOMY  09/23/2016   Procedure: POLYPECTOMY;  Surgeon: Daneil Dolin, MD;  [facility];  Service: Endoscopy;;  colon  . RETINAL DETACHMENT SURGERY      Family History  Problem Relation Age of Onset  . Hypertension Father   . Colon cancer Neg Hx     Social History   Socioeconomic History  . Marital status: Married    Spouse name: Not on file  . Number of children: Not on file  . Years of education: Not on file  . Highest education level: Not on file  Occupational History  . Not on file  Tobacco Use  . Smoking status: Former Smoker    Packs/day: 1.50    Years: 30.00    Pack years: 45.00    Types: Cigarettes    Quit date: 07/05/2012    Years since quitting: 7.2  .  Smokeless tobacco: Never Used  Vaping Use  . Vaping Use: Never used  Substance and Sexual Activity  . Alcohol use: No  . Drug use: No  . Sexual activity: Not Currently    Birth control/protection: None  Other Topics Concern  . Not on file  Social History Narrative  . Not on file   Social Determinants of Health   Financial Resource Strain:   . Difficulty of Paying Living Expenses: Not on file  Food Insecurity:   . Worried About Charity fundraiser in the Last Year: Not on file  . Ran Out of Food in the Last Year:  Not on file  Transportation Needs:   . Lack of Transportation (Medical): Not on file  . Lack of Transportation (Non-Medical): Not on file  Physical Activity:   . Days of Exercise per Week: Not on file  . Minutes of Exercise per Session: Not on file  Stress:   . Feeling of Stress : Not on file  Social Connections:   . Frequency of Communication with Friends and Family: Not on file  . Frequency of Social Gatherings with Friends and Family: Not on file  . Attends Religious Services: Not on file  . Active Member of Clubs or Organizations: Not on file  . Attends Archivist Meetings: Not on file  . Marital Status: Not on file  Intimate Partner Violence:   . Fear of Current or Ex-Partner: Not on file  . Emotionally Abused: Not on file  . Physically Abused: Not on file  . Sexually Abused: Not on file      ROS:  General: Negative for anorexia, weight loss, fever, chills, fatigue, weakness. Eyes: Negative for vision changes.  ENT: Negative for hoarseness, difficulty swallowing , nasal congestion. CV: Negative for chest pain, angina, palpitations, dyspnea on exertion, +peripheral edema.  Respiratory: Negative for dyspnea at rest, dyspnea on exertion, cough, sputum, wheezing.  GI: See history of present illness. GU:  Negative for dysuria, hematuria, urinary incontinence, urinary frequency, nocturnal urination.  MS: Negative for joint pain. + low back pain.  Restless legs at night. Ambulates with cane. Derm: Negative for rash or itching.  Neuro: Negative for weakness, abnormal sensation, seizure, frequent headaches, memory loss, confusion.  Psych: Negative for anxiety, depression, suicidal ideation, hallucinations.  Endo: Negative for unusual weight change.  Heme: Negative for bruising or bleeding. Allergy: Negative for rash or hives.    Physical Examination:  BP (!) 157/91   Pulse 72   Temp (!) 97.5 F (36.4 C) (Oral)   Ht 5\' 11"  (1.803 m)   Wt 213 lb 3.2 oz (96.7 kg)   BMI 29.74 kg/m    General: Well-nourished, well-developed in no acute distress.  Head: Normocephalic, atraumatic.   Eyes: Conjunctiva pink, no icterus. Mouth:masked Neck: Supple without thyromegaly, masses, or lymphadenopathy.  Lungs: Clear to auscultation bilaterally.  Heart: Regular rate and rhythm, no murmurs rubs or gallops.  Abdomen: Bowel sounds are normal, nontender, nondistended, no hepatosplenomegaly or masses, no abdominal bruits or    hernia , no rebound or guarding.   Rectal: not performed Extremities: No lower extremity edema. No clubbing or deformities.  Neuro: Alert and oriented x 4 , grossly normal neurologically.  Skin: Warm and dry, no rash or jaundice.   Psych: Alert and cooperative, normal mood and affect.  Labs: Lab Results  Component Value Date   HGBA1C 6.6 (A) 07/06/2019   Lab Results  Component Value Date   CREATININE 0.84 03/29/2019   BUN 11 03/29/2019   NA 139 03/29/2019   K 4.6 03/29/2019   CL 102 03/29/2019   CO2 23 03/29/2019   Reviewed 06/2019 labs scanned into epic. No anemia. LFTs normal.    Imaging Studies: VAS [place] ABI WITH/WO TBI  Result Date: 09/14/2019 LOWER EXTREMITY DOPPLER STUDY Indications: Claudication, and peripheral artery disease. High Risk Factors: Hypertension, hyperlipidemia, Diabetes, past history of                    smoking, prior MI.  Comparison Study: No prior study at this facility. Performing  Technologist: Delorise Shiner RVT  Examination Guidelines: A  complete evaluation includes at minimum, Doppler waveform signals and systolic blood pressure reading at the level of bilateral brachial, anterior tibial, and posterior tibial arteries, when vessel segments are accessible. Bilateral testing is considered an integral part of a complete examination. Photoelectric Plethysmograph (PPG) waveforms and toe systolic pressure readings are included as required and additional duplex testing as needed. Limited examinations for reoccurring indications may be performed as noted.  ABI Findings: +---------+------------------+-----+----------+--------+ Right    Rt Pressure (mmHg)IndexWaveform  Comment  +---------+------------------+-----+----------+--------+ Brachial 182                                       +---------+------------------+-----+----------+--------+ ATA      79                0.40                    +---------+------------------+-----+----------+--------+ PTA      102               0.51 monophasic         +---------+------------------+-----+----------+--------+ DP                              monophasic         +---------+------------------+-----+----------+--------+ Great Toe50                0.25                    +---------+------------------+-----+----------+--------+ +---------+------------------+-----+---------+-------+ Left     Lt Pressure (mmHg)IndexWaveform Comment +---------+------------------+-----+---------+-------+ Brachial 199                                     +---------+------------------+-----+---------+-------+ ATA      172               0.86                  +---------+------------------+-----+---------+-------+ PTA      163               0.82 triphasic        +---------+------------------+-----+---------+-------+ DP                              biphasic         +---------+------------------+-----+---------+-------+ Great  Toe100               0.50                  +---------+------------------+-----+---------+-------+ +-------+-----------+-----------+------------+------------+ ABI/TBIToday's ABIToday's TBIPrevious ABIPrevious TBI +-------+-----------+-----------+------------+------------+ Right  0.51       0.25                                +-------+-----------+-----------+------------+------------+ Left   0.86       0.50                                +-------+-----------+-----------+------------+------------+  Summary: Right: Resting right ankle-brachial index indicates moderate right lower extremity arterial disease. The right toe-brachial index is abnormal. RT great toe pressure = 50 mmHg. Left: Resting left ankle-brachial index  indicates mild left lower extremity arterial disease. The left toe-brachial index is abnormal. LT Great toe pressure = 100 mmHg.  *See table(s) above for measurements and observations.  Electronically signed by Monica Martinez MD on 09/14/2019 at 11:38:16 AM.    Final    VAS US CAROTID  Result Date: 09/14/2019 Carotid Arterial Duplex Study Indications:       Carotid artery disease. Risk Factors:      Hyperlipidemia, Diabetes, past history of smoking, prior MI,                    PAD. Comparison Study:  Prior carotid duplex performed 04/01/2019 showed a 60-79%                    RICA stenosis, 23-55% LICA stenosis and >73% RECA stenosis. Performing Technologist: Delorise Shiner RVT  Examination Guidelines: A complete evaluation includes B-mode imaging, spectral Doppler, color Doppler, and power Doppler as needed of all accessible portions of each vessel. Bilateral testing is considered an integral part of a complete examination. Limited examinations for reoccurring indications may be performed as noted.  Right Carotid Findings: +----------+--------+--------+--------+----------------------+--------+           PSV cm/sEDV cm/sStenosisPlaque Description    Comments  +----------+--------+--------+--------+----------------------+--------+ CCA Prox  98      16              hypoechoic and smooth          +----------+--------+--------+--------+----------------------+--------+ CCA Mid   75      17              smooth                         +----------+--------+--------+--------+----------------------+--------+ CCA Distal94      22              heterogenous                   +----------+--------+--------+--------+----------------------+--------+ ICA Prox  339     89      60-79%  calcific and irregular         +----------+--------+--------+--------+----------------------+--------+ ICA Mid   161     47                                             +----------+--------+--------+--------+----------------------+--------+ ICA Distal75      27              smooth                         +----------+--------+--------+--------+----------------------+--------+ ECA       258     19      >50%    calcific                       +----------+--------+--------+--------+----------------------+--------+ +----------+--------+-------+----------------+-------------------+           PSV cm/sEDV cmsDescribe        Arm Pressure (mmHG) +----------+--------+-------+----------------+-------------------+ [id number]      0      Multiphasic, WNL                    +----------+--------+-------+----------------+-------------------+ +---------+--------+--+--------+--+---------+ VertebralPSV cm/s45EDV cm/s13Antegrade +---------+--------+--+--------+--+---------+  Left Carotid Findings: +----------+--------+--------+--------+-----------------------+--------+  PSV cm/sEDV cm/sStenosisPlaque Description     Comments +----------+--------+--------+--------+-----------------------+--------+ CCA Prox  104     22                                              +----------+--------+--------+--------+-----------------------+--------+ CCA  Mid   105     23                                              +----------+--------+--------+--------+-----------------------+--------+ CCA Distal331     64      >50%    smooth and heterogenous         +----------+--------+--------+--------+-----------------------+--------+ ICA Prox  252     48      40-59%  calcific and irregular          +----------+--------+--------+--------+-----------------------+--------+ ICA Mid   182     43                                              +----------+--------+--------+--------+-----------------------+--------+ ICA Distal99      33                                              +----------+--------+--------+--------+-----------------------+--------+ ECA       336     0       >50%    calcific and irregular          +----------+--------+--------+--------+-----------------------+--------+ +----------+--------+--------+----------------+-------------------+           PSV cm/sEDV cm/sDescribe        Arm Pressure (mmHG) +----------+--------+--------+----------------+-------------------+ Subclavian102     0       Multiphasic, WNL                    +----------+--------+--------+----------------+-------------------+ +---------+--------+--+--------+--+---------+ VertebralPSV cm/s69EDV cm/s17Antegrade +---------+--------+--+--------+--+---------+   Summary: Right Carotid: Velocities in the right ICA are consistent with a 60-79%                stenosis. Hemodynamically significant plaque >50% visualized in                the CCA. The ECA appears >50% stenosed. Left Carotid: Velocities in the left ICA are consistent with a 40-59% stenosis.               Hemodynamically significant plaque >50% visualized in the CCA. The               ECA appears >50% stenosed. Vertebrals:  Bilateral vertebral arteries demonstrate antegrade flow. Subclavians: Normal flow hemodynamics were seen in bilateral subclavian              arteries. *See table(s)  above for measurements and observations.  Electronically signed by Monica Martinez MD on 09/14/2019 at 55:39:08 AM.    Final     Impression/Plan:  Pleasant 69 year old gentleman with history of CAD with stent at time of MI in March 2020, PAD, A. fib on Eliquis, diabetes who presents to schedule 3 year surveillance colonoscopy for history of adenomatous colon polyps.  He has had some recurrent constipation similar to in 2018 when he had his last colonoscopy.  At that time he was on Movantik in the setting of opioid use.  Currently he is not using hydrocodone on a regular basis.  We will start Linzess 145 mcg daily.  Samples and prescription provided.  Discussed at length with patient, need to manage constipation prior to colonoscopy to ensure adequate bowel prep.  Schedule colonoscopy in the near future with Dr. Gala Romney with propofol. ASA III. Plans to hold Eliquis 48 hours before procedure.  I have discussed the risks, alternatives, benefits with regards to but not limited to the risk of reaction to medication, bleeding, infection, perforation and the patient is agreeable to proceed. Written consent to be obtained.

## 2019-10-06 ENCOUNTER — Telehealth: Payer: Self-pay | Admitting: Internal Medicine

## 2019-10-06 NOTE — Telephone Encounter (Signed)
Spoke with pts spouse. Pts medication is $123.60 for a 30 day supply of Linzess. Pt isn't able to afford the price of Linzess. Will mail pt assistance forms to pt. Pt wants to know what he should take while waiting to see if pt assistance is covered. Called pts insurance company and pts medication is at a cost of $123.60 due to pt being in the gap phase. Pt will have to pay $6000 in order to get out of the gap phase. Medication is also a tier 3. I checked on other meds to help pts bowels move and they were a tier 3 as well. Please advise.

## 2019-10-06 NOTE — Telephone Encounter (Signed)
Pt was seen by Neil Crouch, PA yesterday. His wife called today to let us know that the medication was going to be over $100 for 30 days and was there something else less expensive. He uses [facility]. Please advise. [phone]

## 2019-10-07 NOTE — Telephone Encounter (Signed)
Spoke with pts spouse. She was notified of Mahala Menghini recommendations of Miralax tid until BM is soft and then once- bid to maintain regular BM.

## 2019-10-07 NOTE — Telephone Encounter (Signed)
He can try miralx one capful three times daily until BM soft, then continue once to twice daily to maintain regular BMs.

## 2019-10-12 ENCOUNTER — Telehealth: Payer: Self-pay | Admitting: *Deleted

## 2019-10-12 NOTE — Telephone Encounter (Signed)
Patient spouse called requesting samples of Linzess. She states she also dropped off paperwork today to try to get assistance.

## 2019-10-12 NOTE — Telephone Encounter (Signed)
Spoke with pts spouse. We will hold samples of Linzess 145 mcg when they come in.

## 2019-10-13 ENCOUNTER — Telehealth: Payer: Self-pay | Admitting: *Deleted

## 2019-10-13 DIAGNOSIS — D225 Melanocytic nevi of trunk: Secondary | ICD-10-CM | POA: Diagnosis not present

## 2019-10-13 DIAGNOSIS — Z85820 Personal history of malignant melanoma of skin: Secondary | ICD-10-CM | POA: Diagnosis not present

## 2019-10-13 DIAGNOSIS — Z1283 Encounter for screening for malignant neoplasm of skin: Secondary | ICD-10-CM | POA: Diagnosis not present

## 2019-10-13 DIAGNOSIS — L820 Inflamed seborrheic keratosis: Secondary | ICD-10-CM | POA: Diagnosis not present

## 2019-10-13 DIAGNOSIS — Z08 Encounter for follow-up examination after completed treatment for malignant neoplasm: Secondary | ICD-10-CM | POA: Diagnosis not present

## 2019-10-13 DIAGNOSIS — D485 Neoplasm of uncertain behavior of skin: Secondary | ICD-10-CM | POA: Diagnosis not present

## 2019-10-13 NOTE — Telephone Encounter (Signed)
Paperwork was placed in Neil Crouch, [place] office.

## 2019-10-13 NOTE — Telephone Encounter (Signed)
Called spoke with pt. He has been scheduled for TCS with propofol, Dr. Gala Romney, ASA 3 on 11/22 at 7:30am. patinet aware will need pre-op/covid test prior. Advised will mail with prep instructions. Confirmed mailing address. Aware to hold eliquis 48 hrs prior.

## 2019-10-14 ENCOUNTER — Encounter: Payer: Self-pay | Admitting: *Deleted

## 2019-10-18 DIAGNOSIS — Z6830 Body mass index (BMI) 30.0-30.9, adult: Secondary | ICD-10-CM | POA: Diagnosis not present

## 2019-10-18 DIAGNOSIS — R69 Illness, unspecified: Secondary | ICD-10-CM | POA: Diagnosis not present

## 2019-10-18 DIAGNOSIS — G894 Chronic pain syndrome: Secondary | ICD-10-CM | POA: Diagnosis not present

## 2019-10-18 DIAGNOSIS — M150 Primary generalized (osteo)arthritis: Secondary | ICD-10-CM | POA: Diagnosis not present

## 2019-10-19 DIAGNOSIS — R69 Illness, unspecified: Secondary | ICD-10-CM | POA: Diagnosis not present

## 2019-10-20 ENCOUNTER — Telehealth: Payer: Self-pay | Admitting: Internal Medicine

## 2019-10-20 NOTE — Telephone Encounter (Signed)
Pt's wife called to follow up on patient assistance for medication. Please call [phone]

## 2019-10-22 NOTE — Telephone Encounter (Signed)
Called pt assistance and the application is still being processed. Samples of Linzess 145 mcg are ready for pickup.

## 2019-10-29 NOTE — Telephone Encounter (Signed)
Pt assistance for Linzess 145 mcg has been approved. Medication should ship in 7-10 business days. Spoke with pts spouse and she is aware of approval.

## 2019-11-03 DIAGNOSIS — D485 Neoplasm of uncertain behavior of skin: Secondary | ICD-10-CM | POA: Diagnosis not present

## 2019-11-03 DIAGNOSIS — L988 Other specified disorders of the skin and subcutaneous tissue: Secondary | ICD-10-CM | POA: Diagnosis not present

## 2019-11-04 DIAGNOSIS — E1122 Type 2 diabetes mellitus with diabetic chronic kidney disease: Secondary | ICD-10-CM | POA: Diagnosis not present

## 2019-11-04 DIAGNOSIS — I130 Hypertensive heart and chronic kidney disease with heart failure and stage 1 through stage 4 chronic kidney disease, or unspecified chronic kidney disease: Secondary | ICD-10-CM | POA: Diagnosis not present

## 2019-11-04 DIAGNOSIS — I5023 Acute on chronic systolic (congestive) heart failure: Secondary | ICD-10-CM | POA: Diagnosis not present

## 2019-11-04 DIAGNOSIS — N181 Chronic kidney disease, stage 1: Secondary | ICD-10-CM | POA: Diagnosis not present

## 2019-11-05 ENCOUNTER — Encounter: Payer: Self-pay | Admitting: Nurse Practitioner

## 2019-11-05 ENCOUNTER — Ambulatory Visit (INDEPENDENT_AMBULATORY_CARE_PROVIDER_SITE_OTHER): Payer: Medicare HMO | Admitting: Nurse Practitioner

## 2019-11-05 ENCOUNTER — Other Ambulatory Visit: Payer: Self-pay

## 2019-11-05 VITALS — BP 185/72 | HR 58 | Ht 71.0 in | Wt 215.2 lb

## 2019-11-05 DIAGNOSIS — E782 Mixed hyperlipidemia: Secondary | ICD-10-CM | POA: Diagnosis not present

## 2019-11-05 DIAGNOSIS — E118 Type 2 diabetes mellitus with unspecified complications: Secondary | ICD-10-CM | POA: Diagnosis not present

## 2019-11-05 DIAGNOSIS — I10 Essential (primary) hypertension: Secondary | ICD-10-CM | POA: Diagnosis not present

## 2019-11-05 DIAGNOSIS — E1165 Type 2 diabetes mellitus with hyperglycemia: Secondary | ICD-10-CM | POA: Diagnosis not present

## 2019-11-05 DIAGNOSIS — IMO0002 Reserved for concepts with insufficient information to code with codable children: Secondary | ICD-10-CM

## 2019-11-05 DIAGNOSIS — E119 Type 2 diabetes mellitus without complications: Secondary | ICD-10-CM | POA: Diagnosis not present

## 2019-11-05 DIAGNOSIS — Z794 Long term (current) use of insulin: Secondary | ICD-10-CM | POA: Diagnosis not present

## 2019-11-05 LAB — POCT GLYCOSYLATED HEMOGLOBIN (HGB A1C): Hemoglobin A1C: 6.5 % — AB (ref 4.0–5.6)

## 2019-11-05 MED ORDER — BASAGLAR KWIKPEN 100 UNIT/ML ~~LOC~~ SOPN: 35.0000 [IU] | PEN_INJECTOR | Freq: Every day | SUBCUTANEOUS | 3 refills | Status: DC

## 2019-11-05 NOTE — Progress Notes (Signed)
11/05/2019    Endocrinology follow-up note   Subjective:    Patient ID: Xavier Caldwell, male    DOB: 06-Sep-1950,    Past Medical History:  Diagnosis Date  . Acute systolic (congestive) heart failure ([place])   . Anxiety   . Arthritis   . Atrial fibrillation ([place])   . Back pain   . Coronary artery disease   . Diabetes mellitus without complication ([place])   . High cholesterol   . History of gout   . Hypertension   . Myocardial infarction ([place]) 04/2018  . Neuropathy   . PAD (peripheral artery disease) ([place])   . 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 Sleep apnea    Stop Bang score of 4  . STEMI (ST elevation myocardial infarction) ([place])    04/08/2018 PCI/DES RCA  . VT (ventricular tachycardia) ([place])    Past Surgical History:  Procedure Laterality Date  . APPENDECTOMY    . CARDIOVERSION N/A 04/13/2018   Procedure: CARDIOVERSION;  Surgeon: Skeet Latch, MD;  Location: [facility];  Service: Cardiovascular;  Laterality: N/A;  . CATARACT EXTRACTION W/PHACO Right 04/25/2014   Procedure: CATARACT EXTRACTION PHACO AND INTRAOCULAR LENS PLACEMENT ([place]);  Surgeon: Williams Che, MD;  Location: [facility];  Service: Ophthalmology;  Laterality: Right;  CDE:4.70  . CATARACT EXTRACTION W/PHACO Left 07/11/2014   Procedure: CATARACT EXTRACTION PHACO AND INTRAOCULAR LENS PLACEMENT LEFT EYE CDE=8.32;  Surgeon: Williams Che, MD;  Location: [facility];  Service: Ophthalmology;  Laterality: Left;  . COLONOSCOPY WITH PROPOFOL N/A 09/23/2016   [place]: 9 polyps removed, multiple tubular adenomas. recommended 3 yr surveillance  . CORONARY/GRAFT ACUTE MI REVASCULARIZATION N/A 04/07/2018   Procedure: Coronary/Graft Acute MI Revascularization;  Surgeon: Burnell Blanks, MD;  Location: [facility];  Service: Cardiovascular;  Laterality: N/A;  . LEFT HEART CATH AND CORONARY ANGIOGRAPHY N/A 04/07/2018   Procedure: LEFT HEART CATH AND CORONARY ANGIOGRAPHY;  Surgeon: Burnell Blanks, MD;  Location: [facility];  Service: Cardiovascular;  Laterality: N/A;  . MELANOMA EXCISION  07/2019  . POLYPECTOMY  09/23/2016   Procedure: POLYPECTOMY;  Surgeon: Daneil Dolin, MD;  [facility];  Service: Endoscopy;;  colon  . RETINAL DETACHMENT SURGERY     Social History   Socioeconomic History  . Marital status: Married    Spouse name: Not on file  . Number of children: Not on file  . Years of education: Not on file  . Highest education level: Not on file  Occupational History  . Not on file  Tobacco Use  . Smoking status: Former Smoker    Packs/day: 1.50    Years: 30.00    Pack years: 45.00    Types: Cigarettes    Quit date: 07/05/2012    Years since quitting: 7.3  . Smokeless tobacco: Never Used  Vaping Use  . Vaping Use: Never used  Substance and Sexual Activity  . Alcohol use: No  . Drug use: No  . Sexual activity: Not Currently    Birth control/protection: None  Other Topics Concern  . Not on file  Social History Narrative  . Not on file   Social Determinants of Health   Financial Resource Strain:   . Difficulty of Paying Living Expenses: Not on file  Food Insecurity:   . Worried About Charity fundraiser in the Last Year: Not on file  . Ran Out of Food in  the Last Year: Not on file  Transportation Needs:   . Lack of Transportation (Medical): Not on file  . Lack of Transportation (Non-Medical): Not on file  Physical Activity:   . Days of Exercise per Week: Not on file  . Minutes of Exercise per Session: Not on file  Stress:   . Feeling of Stress : Not on file  Social Connections:   . Frequency of Communication with Friends and Family: Not on file  . Frequency of Social Gatherings with Friends and Family: Not on file  . Attends Religious Services: Not on file  . Active Member of Clubs or Organizations: Not on file  . Attends Archivist Meetings: Not on file  . Marital Status: Not on file   Outpatient  Encounter Medications as of 11/05/2019  Medication Sig  . ALPRAZolam (XANAX) 0.5 MG tablet Take 0.5 mg by mouth 2 (two) times daily as needed for anxiety.  Marland Kitchen apixaban (ELIQUIS) 5 MG TABS tablet Take 1 tablet (5 mg total) by mouth 2 (two) times daily.  Marland Kitchen atorvastatin (LIPITOR) 80 MG tablet Take 1 tablet (80 mg total) by mouth daily.  . B Complex-C (SUPER B COMPLEX PO) Take 1 tablet by mouth daily.   . carvedilol (COREG) 6.25 MG tablet Take 1 tablet (6.25 mg total) by mouth 2 (two) times daily.  . DULoxetine (CYMBALTA) 60 MG capsule Take 60 mg by mouth daily with lunch.   . ergocalciferol (VITAMIN D2) 50000 units capsule Take 50,000 Units by mouth every Tuesday.   . gabapentin (NEURONTIN) 300 MG capsule in the morning and at bedtime.   Marland Kitchen glucose blood (ONETOUCH ULTRA) test strip USE ONE TEST STRIP AS DIRECTED TWICE DAILY  . HYDROcodone-acetaminophen (NORCO/VICODIN) 5-325 MG per tablet Take 1 tablet by mouth every 6 (six) hours as needed for moderate pain.  . Insulin Glargine (BASAGLAR KWIKPEN) 100 UNIT/ML Inject 35 Units into the skin at bedtime.  Marland Kitchen linaclotide (LINZESS) 145 MCG CAPS capsule Take 1 capsule (145 mcg total) by mouth daily before breakfast.  . metFORMIN (GLUCOPHAGE) 1000 MG tablet Take 1 tablet (1,000 mg total) by mouth 2 (two) times daily.  . mupirocin ointment (BACTROBAN) 2 % SMARTSIG:1 Application Topical 2-3 Times Daily  . nitroGLYCERIN (NITROSTAT) 0.4 MG SL tablet Place 1 tablet (0.4 mg total) under the tongue every 5 (five) minutes as needed.  Marland Kitchen rOPINIRole (REQUIP) 1 MG tablet Take 1 mg by mouth in the morning, at noon, in the evening, and at bedtime.   . timolol (TIMOPTIC) 0.5 % ophthalmic solution INSTILL 1 DROP INTO EACH EYE TWICE DAILY  . UNIFINE PENTIPS 31G X 5 MM MISC USE TO INJECT INSULIN AT BEDTIME (Patient taking differently: at bedtime. )  . [DISCONTINUED] Insulin Glargine (BASAGLAR KWIKPEN) 100 UNIT/ML Inject 0.5 mLs (50 Units total) into the skin at bedtime. (Patient  taking differently: Inject 50 Units into the skin at bedtime. Takes 35-50 units depending on blood sugar reading.)  . lisinopril (ZESTRIL) 40 MG tablet Take 1 tablet (40 mg total) by mouth daily.   No facility-administered encounter medications on file as of 11/05/2019.   ALLERGIES: No Known Allergies VACCINATION STATUS: Immunization History  Administered Date(s) Administered  . Influenza, High Dose Seasonal PF 10/21/2017    Diabetes He presents for his follow-up (Telephone visit) diabetic visit. He has type 2 diabetes mellitus. His disease course has been improving. Hypoglycemia symptoms include nervousness/anxiousness, sweats and tremors. Pertinent negatives for hypoglycemia include no confusion, headaches, pallor or seizures. Pertinent negatives  for diabetes include no chest pain, no fatigue, no polydipsia, no polyphagia, no polyuria and no weakness. There are no hypoglycemic complications. Symptoms are improving. Diabetic complications include heart disease. Risk factors for coronary artery disease include diabetes mellitus, hypertension, tobacco exposure, male sex, obesity, sedentary lifestyle and dyslipidemia. Current diabetic treatment includes insulin injections and oral agent (monotherapy). He is compliant with treatment most of the time. His weight is fluctuating minimally. He is following a generally healthy diet. Meal planning includes avoidance of concentrated sweets. He has had a previous visit with a dietitian. He participates in exercise intermittently. His home blood glucose trend is decreasing steadily. His breakfast blood glucose range is generally 90-110 mg/dl. His bedtime blood glucose range is generally 110-130 mg/dl. (Patient presents today with his logs, no meter, showing at target fasting and postprandial glycemic profile.  His POCT A1C today is 6.5%, improved from last visit of 6.6%.  He has been adjusting his Basaglar insulin based on his glucose readings taking 35-45 units  nightly.  He reports a recent change in his diet as well.  There are some mild, random episodes of hypoglycemia.) An ACE inhibitor/angiotensin II receptor blocker is being taken. He sees a podiatrist.Eye exam is current.  Hyperlipidemia This is a chronic problem. The current episode started more than 1 year ago. The problem is controlled. Recent lipid tests were reviewed and are normal. Exacerbating diseases include diabetes and obesity. Pertinent negatives include no chest pain, myalgias or shortness of breath. Current antihyperlipidemic treatment includes statins. The current treatment provides mild improvement of lipids. Compliance problems include adherence to exercise.  Risk factors for coronary artery disease include dyslipidemia, diabetes mellitus, hypertension, male sex, a sedentary lifestyle and obesity.  Hypertension This is a chronic problem. The current episode started more than 1 year ago. The problem has been waxing and waning since onset. The problem is uncontrolled. Associated symptoms include sweats. Pertinent negatives include no chest pain, headaches, neck pain, palpitations or shortness of breath. There are no associated agents to hypertension. Risk factors for coronary artery disease include diabetes mellitus, dyslipidemia, male gender, sedentary lifestyle, smoking/tobacco exposure and obesity. Past treatments include beta blockers and ACE inhibitors. The current treatment provides mild improvement. There are no compliance problems.     Review of systems  Constitutional: + Minimally fluctuating body weight,  current Body mass index is 30.01 kg/m. , no fatigue, no subjective hyperthermia, no subjective hypothermia Eyes: no blurry vision, no xerophthalmia ENT: no sore throat, no nodules palpated in throat, no dysphagia/odynophagia, no hoarseness Cardiovascular: no chest pain, no shortness of breath, no palpitations, no leg swelling Respiratory: no cough, no shortness of  breath Gastrointestinal: no nausea/vomiting/diarrhea Musculoskeletal: no muscle/joint aches, walks with cane due to physical debility Skin: no rashes, no hyperemia, history of several lesions showing melanoma Neurological: no tremors, no numbness, no tingling, no dizziness Psychiatric: no depression, no anxiety    Objective:    BP (!) 185/72 (BP Location: Right Arm, Patient Position: Sitting)   Pulse (!) 58   Ht 5\' 11"  (1.803 m)   Wt 215 lb 3.2 oz (97.6 kg)   BMI 30.01 kg/m   Wt Readings from Last 3 Encounters:  11/05/19 215 lb 3.2 oz (97.6 kg)  10/05/19 213 lb 3.2 oz (96.7 kg)  09/14/19 211 lb (95.7 kg)    BP Readings from Last 3 Encounters:  11/05/19 (!) 185/72  10/05/19 (!) 157/91  09/14/19 (!) 147/81    Physical Exam- Limited  Constitutional:  Body mass index  is 30.01 kg/m. , not in acute distress, normal state of mind Eyes:  EOMI, no exophthalmos Neck: Supple Thyroid: No gross goiter Cardiovascular: RRR, no murmers, rubs, or gallops, no edema Respiratory: Adequate breathing efforts, no crackles, rales, rhonchi, or wheezing Musculoskeletal: no gross deformities, no gross restriction of joint movements, walks with cane due to physical debility Skin:  no rashes, no hyperemia Neurological: no tremor with outstretched hands   Results for orders placed or performed in visit on 07/06/19  HgB A1c  Result Value Ref Range   Hemoglobin A1C 6.6 (A) 4.0 - 5.6 %   HbA1c POC (<> result, manual entry)     HbA1c, POC (prediabetic range)     HbA1c, POC (controlled diabetic range)     Diabetic Labs (most recent): Lab Results  Component Value Date   HGBA1C 6.6 (A) 07/06/2019   HGBA1C 7.5 (A) 03/04/2019   HGBA1C 7.3 (H) 10/22/2018   Lipid Panel     Component Value Date/Time   CHOL 107 04/17/2018 0829   TRIG 92 04/17/2018 0829   HDL 32 (L) 04/17/2018 0829   CHOLHDL 4.4 04/07/2018 1349   VLDL 18 04/07/2018 1349   LDLCALC 57 04/17/2018 0829    Assessment & Plan:    1. Uncontrolled type 2 diabetes mellitus complicated by coronary artery disease.    Patient presents today with his logs, no meter, showing at target fasting and postprandial glycemic profile.  His POCT A1C today is 6.5%, improved from last visit of 6.6%.  He has been adjusting his Basaglar insulin based on his glucose readings taking 35-45 units nightly.  He reports a recent change in his diet as well.  There are some mild, random episodes of hypoglycemia.  -He remains at extremely  high risk for more  acute and chronic complications of diabetes which include CAD, CVA, CKD, retinopathy, and neuropathy. These are all discussed in detail with the patient.  - Nutritional counseling repeated at each appointment due to patients tendency to fall back in to old habits.  - The patient admits there is a room for improvement in their diet and drink choices. -  Suggestion is made for the patient to avoid simple carbohydrates from their diet including Cakes, Sweet Desserts / Pastries, Ice Cream, Soda (diet and regular), Sweet Tea, Candies, Chips, Cookies, Sweet Pastries,  Store Bought Juices, Alcohol in Excess of  1-2 drinks a day, Artificial Sweeteners, Coffee Creamer, and "Sugar-free" Products. This will help patient to have stable blood glucose profile and potentially avoid unintended weight gain.   - I encouraged the patient to switch to  unprocessed or minimally processed complex starch and increased protein intake (animal or plant source), fruits, and vegetables.   - Patient is advised to stick to a routine mealtimes to eat 3 meals  a day and avoid unnecessary snacks ( to snack only to correct hypoglycemia).  -Based on his treatment response, he will not need prandial insulin for now, advised to increase Basaglar to 50 units nightly, associated with monitoring of blood glucose twice a day-daily before breakfast and at bedtime.    -He is encouraged to call clinic if blood glucose readings are below 70  or above 2003. -He is advised to continue metformin 1000 mg p.o. twice daily--daily with breakfast and supper.   2) BP/HTN:  His blood pressure is not controlled to target.  He had not long taken his medication prior to todays appointment.  He is advised to continue Coreg 6.25 mg po twice  daily and Lisinopril 40 mg po daily.  Will consider adding medication at next visit if BP continues to be elevated.  3) Lipids/HPL:  His most recent lipid panel from 04/17/18 shows controlled LDL at 57.  He is advised to continue Lipitor 80 mg po daily at bedtime.  Side effects and precautions discussed with him.  4) Chronic Care/Health Maintenance: -He is on ACE and statin medication and is encouraged to initiate and continue to follow up with Ophthalmology, Dentist,  Podiatrist at least yearly or according to recommendations, and advised to stay away from smoking. I have recommended yearly flu vaccine and pneumonia vaccine at least every 5 years; moderate intensity exercise for up to 150 minutes weekly; and  sleep for at least 7 hours a day.   He is advised to maintain close follow-up with his PMD, Dr. Gerarda Fraction.  - Time spent on this patient care encounter:  37 min, of which > 50% was spent in counseling and the rest reviewing his blood glucose logs , discussing his hypoglycemia and hyperglycemia episodes, reviewing his current and  previous labs / studies  ( including abstraction from other facilities) and medications  doses and developing a  long term treatment plan and documenting his care.   Please refer to Patient Instructions for Blood Glucose Monitoring and Insulin/Medications Dosing Guide"  in media tab for additional information. Please  also refer to " Patient Self Inventory" in the Media  tab for reviewed elements of pertinent patient history.  Xavier Caldwell participated in the discussions, expressed understanding, and voiced agreement with the above plans.  All questions were answered to his  satisfaction. he is encouraged to contact clinic should he have any questions or concerns prior to his return visit.   Follow up plan: Return in about 4 months (around 03/07/2020) for Diabetes follow up, Previsit labs, Virtual visit ok.  Rayetta Pigg, [facility] [address] [place], [place] [address] Phone: [phone] Fax: [phone]  11/05/2019, 9:06 AM

## 2019-11-05 NOTE — Patient Instructions (Signed)

## 2019-11-09 ENCOUNTER — Other Ambulatory Visit: Payer: Self-pay | Admitting: Nurse Practitioner

## 2019-11-09 MED ORDER — BASAGLAR KWIKPEN 100 UNIT/ML ~~LOC~~ SOPN: 35.0000 [IU] | PEN_INJECTOR | Freq: Every day | SUBCUTANEOUS | 3 refills | Status: DC

## 2019-11-16 ENCOUNTER — Telehealth: Payer: Self-pay | Admitting: *Deleted

## 2019-11-16 NOTE — Telephone Encounter (Signed)
Pt wife [name] notified that pt assistance Eliquis has arrived in the office.

## 2019-11-17 ENCOUNTER — Other Ambulatory Visit: Payer: Self-pay | Admitting: "Endocrinology

## 2019-11-17 DIAGNOSIS — Z23 Encounter for immunization: Secondary | ICD-10-CM | POA: Diagnosis not present

## 2019-11-26 DIAGNOSIS — R69 Illness, unspecified: Secondary | ICD-10-CM | POA: Diagnosis not present

## 2019-12-04 DIAGNOSIS — N181 Chronic kidney disease, stage 1: Secondary | ICD-10-CM | POA: Diagnosis not present

## 2019-12-04 DIAGNOSIS — I5023 Acute on chronic systolic (congestive) heart failure: Secondary | ICD-10-CM | POA: Diagnosis not present

## 2019-12-04 DIAGNOSIS — E1122 Type 2 diabetes mellitus with diabetic chronic kidney disease: Secondary | ICD-10-CM | POA: Diagnosis not present

## 2019-12-04 DIAGNOSIS — I130 Hypertensive heart and chronic kidney disease with heart failure and stage 1 through stage 4 chronic kidney disease, or unspecified chronic kidney disease: Secondary | ICD-10-CM | POA: Diagnosis not present

## 2019-12-23 NOTE — Patient Instructions (Signed)
Your procedure is scheduled on: 12/27/2019  Report to [facility] at  6:15   AM.  Call this number if you have problems the morning of surgery: [phone]   Remember:              Follow Directions on the letter you received from Your Physician's office regarding the Bowel Prep              No Smoking the day of Procedure :   Take these medicines the morning of surgery with A SIP OF WATER: Carvedilol, cymbalta, and gabapentin             No diabetic medication morning of procedure             Take only 1/2 dose of Basaglar insulin  (17.5 units) the night before procedure   Do not wear jewelry, make-up or nail polish.    Do not bring valuables to the hospital.  Contacts, dentures or bridgework may not be worn into surgery.  .   Patients discharged the day of surgery will not be allowed to drive home.     Colonoscopy, Adult, Care After This sheet gives you information about how to care for yourself after your procedure. Your health care provider may also give you more specific instructions. If you have problems or questions, contact your health care provider. What can I expect after the procedure? After the procedure, it is common to have:  A small amount of blood in your stool for 24 hours after the procedure.  Some gas.  Mild abdominal cramping or bloating.  Follow these instructions at home: General instructions   For the first 24 hours after the procedure: ? Do not drive or use machinery. ? Do not sign important documents. ? Do not drink alcohol. ? Do your regular daily activities at a slower pace than normal. ? Eat soft, easy-to-digest foods. ? Rest often.  Take over-the-counter or prescription medicines only as told by your health care provider.  It is up to you to get the results of your procedure. Ask your health care provider, or the department performing the procedure, when your results will be ready. Relieving cramping and bloating  Try walking around  when you have cramps or feel bloated.  Apply heat to your abdomen as told by your health care provider. Use a heat source that your health care provider recommends, such as a moist heat pack or a heating pad. ? Place a towel between your skin and the heat source. ? Leave the heat on for 20-30 minutes. ? Remove the heat if your skin turns bright red. This is especially important if you are unable to feel pain, heat, or cold. You may have a greater risk of getting burned. Eating and drinking  Drink enough fluid to keep your urine clear or pale yellow.  Resume your normal diet as instructed by your health care provider. Avoid heavy or fried foods that are hard to digest.  Avoid drinking alcohol for as long as instructed by your health care provider. Contact a health care provider if:  You have blood in your stool 2-3 days after the procedure. Get help right away if:  You have more than a small spotting of blood in your stool.  You pass large blood clots in your stool.  Your abdomen is swollen.  You have nausea or vomiting.  You have a fever.  You have increasing abdominal pain that is not relieved with medicine.  This information is not intended to replace advice given to you by your health care provider. Make sure you discuss any questions you have with your health care provider. Document Released: 09/05/2003 Document Revised: 10/16/2015 Document Reviewed: 04/04/2015 Elsevier Interactive Patient Education  [employer].

## 2019-12-24 ENCOUNTER — Other Ambulatory Visit: Payer: Self-pay

## 2019-12-24 ENCOUNTER — Encounter (HOSPITAL_COMMUNITY)
Admission: RE | Admit: 2019-12-24 | Discharge: 2019-12-24 | Disposition: A | Discharge status: Home / Self Care | Payer: Medicare HMO | Source: Ambulatory Visit | Attending: Internal Medicine | Admitting: Internal Medicine

## 2019-12-24 ENCOUNTER — Other Ambulatory Visit (HOSPITAL_COMMUNITY)
Admission: RE | Admit: 2019-12-24 | Discharge: 2019-12-24 | Disposition: A | Discharge status: Home / Self Care | Payer: Medicare HMO | Source: Ambulatory Visit | Attending: Internal Medicine | Admitting: Internal Medicine

## 2019-12-24 DIAGNOSIS — Z01812 Encounter for preprocedural laboratory examination: Secondary | ICD-10-CM | POA: Diagnosis not present

## 2019-12-24 DIAGNOSIS — Z20822 Contact with and (suspected) exposure to covid-19: Secondary | ICD-10-CM | POA: Insufficient documentation

## 2019-12-25 LAB — SARS CORONAVIRUS 2 (TAT 6-24 HRS): SARS Coronavirus 2: NEGATIVE

## 2019-12-27 ENCOUNTER — Ambulatory Visit (HOSPITAL_COMMUNITY): Payer: Medicare HMO | Admitting: Anesthesiology

## 2019-12-27 ENCOUNTER — Ambulatory Visit (HOSPITAL_COMMUNITY)
Admission: RE | Admit: 2019-12-27 | Discharge: 2019-12-27 | Disposition: A | Discharge status: Home / Self Care | Payer: Medicare HMO | Attending: Internal Medicine | Admitting: Internal Medicine

## 2019-12-27 ENCOUNTER — Encounter (HOSPITAL_COMMUNITY): Payer: Self-pay | Admitting: Internal Medicine

## 2019-12-27 ENCOUNTER — Encounter (HOSPITAL_COMMUNITY)
Admission: RE | Disposition: A | Discharge status: Home / Self Care | Payer: Self-pay | Source: Home / Self Care | Attending: Internal Medicine

## 2019-12-27 DIAGNOSIS — Z794 Long term (current) use of insulin: Secondary | ICD-10-CM | POA: Insufficient documentation

## 2019-12-27 DIAGNOSIS — K621 Rectal polyp: Secondary | ICD-10-CM | POA: Insufficient documentation

## 2019-12-27 DIAGNOSIS — Z7984 Long term (current) use of oral hypoglycemic drugs: Secondary | ICD-10-CM | POA: Diagnosis not present

## 2019-12-27 DIAGNOSIS — Z79899 Other long term (current) drug therapy: Secondary | ICD-10-CM | POA: Insufficient documentation

## 2019-12-27 DIAGNOSIS — Z955 Presence of coronary angioplasty implant and graft: Secondary | ICD-10-CM | POA: Diagnosis not present

## 2019-12-27 DIAGNOSIS — G4730 Sleep apnea, unspecified: Secondary | ICD-10-CM | POA: Diagnosis not present

## 2019-12-27 DIAGNOSIS — K5730 Diverticulosis of large intestine without perforation or abscess without bleeding: Secondary | ICD-10-CM | POA: Diagnosis not present

## 2019-12-27 DIAGNOSIS — I4891 Unspecified atrial fibrillation: Secondary | ICD-10-CM | POA: Diagnosis not present

## 2019-12-27 DIAGNOSIS — D120 Benign neoplasm of cecum: Secondary | ICD-10-CM | POA: Insufficient documentation

## 2019-12-27 DIAGNOSIS — Z7901 Long term (current) use of anticoagulants: Secondary | ICD-10-CM | POA: Insufficient documentation

## 2019-12-27 DIAGNOSIS — Z8674 Personal history of sudden cardiac arrest: Secondary | ICD-10-CM | POA: Insufficient documentation

## 2019-12-27 DIAGNOSIS — I252 Old myocardial infarction: Secondary | ICD-10-CM | POA: Diagnosis not present

## 2019-12-27 DIAGNOSIS — Z86010 Personal history of colonic polyps: Secondary | ICD-10-CM | POA: Diagnosis not present

## 2019-12-27 DIAGNOSIS — K635 Polyp of colon: Secondary | ICD-10-CM | POA: Diagnosis not present

## 2019-12-27 DIAGNOSIS — Z87891 Personal history of nicotine dependence: Secondary | ICD-10-CM | POA: Insufficient documentation

## 2019-12-27 DIAGNOSIS — D124 Benign neoplasm of descending colon: Secondary | ICD-10-CM | POA: Insufficient documentation

## 2019-12-27 DIAGNOSIS — D123 Benign neoplasm of transverse colon: Secondary | ICD-10-CM | POA: Insufficient documentation

## 2019-12-27 DIAGNOSIS — Z1211 Encounter for screening for malignant neoplasm of colon: Secondary | ICD-10-CM | POA: Diagnosis not present

## 2019-12-27 DIAGNOSIS — I10 Essential (primary) hypertension: Secondary | ICD-10-CM | POA: Diagnosis not present

## 2019-12-27 DIAGNOSIS — I2510 Atherosclerotic heart disease of native coronary artery without angina pectoris: Secondary | ICD-10-CM | POA: Diagnosis not present

## 2019-12-27 HISTORY — PX: COLONOSCOPY WITH PROPOFOL: SHX5780

## 2019-12-27 HISTORY — PX: POLYPECTOMY: SHX5525

## 2019-12-27 LAB — GLUCOSE, CAPILLARY: Glucose-Capillary: 123 mg/dL — ABNORMAL HIGH (ref 70–99)

## 2019-12-27 SURGERY — COLONOSCOPY WITH PROPOFOL
Anesthesia: General

## 2019-12-27 MED ORDER — PROPOFOL 10 MG/ML IV BOLUS
INTRAVENOUS | Status: DC | PRN
  Administered 2019-12-27: 30 mg via INTRAVENOUS
  Administered 2019-12-27: 50 mg via INTRAVENOUS

## 2019-12-27 MED ORDER — PHENYLEPHRINE HCL (PRESSORS) 10 MG/ML IV SOLN
INTRAVENOUS | Status: DC | PRN
  Administered 2019-12-27 (×10): 80 ug via INTRAVENOUS

## 2019-12-27 MED ORDER — EPHEDRINE 5 MG/ML INJ
INTRAVENOUS | Status: AC
  Filled 2019-12-27: qty 10

## 2019-12-27 MED ORDER — PHENYLEPHRINE 40 MCG/ML (10ML) SYRINGE FOR IV PUSH (FOR BLOOD PRESSURE SUPPORT)
PREFILLED_SYRINGE | INTRAVENOUS | Status: AC
  Filled 2019-12-27: qty 10

## 2019-12-27 MED ORDER — LIDOCAINE HCL (PF) 2 % IJ SOLN
INTRAMUSCULAR | Status: AC
  Filled 2019-12-27: qty 5

## 2019-12-27 MED ORDER — EPHEDRINE SULFATE 50 MG/ML IJ SOLN
INTRAMUSCULAR | Status: DC | PRN
  Administered 2019-12-27 (×5): 10 mg via INTRAVENOUS

## 2019-12-27 MED ORDER — PROPOFOL 10 MG/ML IV BOLUS
INTRAVENOUS | Status: AC
  Filled 2019-12-27: qty 120

## 2019-12-27 MED ORDER — LACTATED RINGERS IV SOLN: Freq: Once | INTRAVENOUS | Status: AC

## 2019-12-27 MED ORDER — PROPOFOL 500 MG/50ML IV EMUL
INTRAVENOUS | Status: DC | PRN
  Administered 2019-12-27: 150 ug/kg/min via INTRAVENOUS
  Administered 2019-12-27: 100 ug/kg/min via INTRAVENOUS

## 2019-12-27 MED ORDER — STERILE WATER FOR IRRIGATION IR SOLN
Status: DC | PRN
  Administered 2019-12-27: 1.5 mL

## 2019-12-27 MED ORDER — LIDOCAINE HCL (CARDIAC) PF 100 MG/5ML IV SOSY
PREFILLED_SYRINGE | INTRAVENOUS | Status: DC | PRN
  Administered 2019-12-27: 50 mg via INTRAVENOUS

## 2019-12-27 NOTE — Op Note (Signed)
[facility] Patient Name: Xavier Caldwell Procedure Date: 12/27/2019 7:18 AM MRN: [id number] Date of Birth: March 30, 1950 Attending MD: Norvel Richards , MD CSN: [id number] Age: 69 Admit Type: Outpatient Procedure:                Colonoscopy Indications:              High risk colon cancer surveillance: Personal                            history of colonic polyps Providers:                Norvel Richards, MD, Lambert Mody, Caprice Kluver, Randa Spike, Technician Referring MD:              Medicines:                Propofol per Anesthesia Complications:            No immediate complications. Estimated Blood Loss:     Estimated blood loss was minimal. Procedure:                Pre-Anesthesia Assessment:                           - Prior to the procedure, a History and Physical                            was performed, and patient medications and                            allergies were reviewed. The patient's tolerance of                            previous anesthesia was also reviewed. The risks                            and benefits of the procedure and the sedation                            options and risks were discussed with the patient.                            All questions were answered, and informed consent                            was obtained. Prior Anticoagulants: The patient                            last took Eliquis (apixaban) 2 days prior to the                            procedure. ASA Grade Assessment: III - A patient  with severe systemic disease. After reviewing the                            risks and benefits, the patient was deemed in                            satisfactory condition to undergo the procedure.                           After obtaining informed consent, the colonoscope                            was passed under direct vision. Throughout the                             procedure, the patient's blood pressure, pulse, and                            oxygen saturations were monitored continuously. The                            CF-HQ190L ([id number]) scope was introduced through                            the anus and advanced to the the cecum, identified                            by appendiceal orifice and ileocecal valve. The                            colonoscopy was performed without difficulty. The                            patient tolerated the procedure well. The quality                            of the bowel preparation was adequate. Scope In: 7:32:27 AM Scope Out: 8:15:33 AM Scope Withdrawal Time: 0 hours 31 minutes 15 seconds  Total Procedure Duration: 0 hours 43 minutes 6 seconds  Findings:      The perianal and digital rectal examinations were normal.      Scattered medium-mouthed diverticula were found in the sigmoid colon.      Ten sessile polyps were found in the descending colon, hepatic flexure       and cecum. The polyps were 4 to 9 mm in size. These polyps were removed       with a cold snare. Resection and retrieval were complete. Estimated       blood loss was minimal.      A 11 mm polyp was found in the sigmoid colon. The polyp was       semi-pedunculated. The polyp was removed with a hot snare. Resection and       retrieval were complete. Estimated blood loss: none. Clip x1 for       prophylaxis      Three sessile polyps were found in  the rectum and mid rectum. The polyps       were 2 to 3 mm in size. These polyps were removed with a cold biopsy       forceps. Resection and retrieval were complete. Estimated blood loss was       minimal.      The exam was otherwise without abnormality on direct and retroflexion       views. Impression:               - Diverticulosis in the sigmoid colon.                           - Ten 4 to 9 mm polyps in the descending colon, at                            the hepatic flexure and in the cecum,  removed with                            a cold snare. Resected and retrieved.                           - One 11 mm polyp in the sigmoid colon, removed                            with a hot snare. Resected and retrieved. Site                            clipped as a prophylactic maneuver                           - Three 2 to 3 mm polyps in the mid rectum, removed                            with a cold biopsy forceps. Resected and retrieved.                           - The examination was otherwise normal on direct                            and retroflexion views. Moderate Sedation:      Moderate (conscious) sedation was personally administered by an       anesthesia professional. The following parameters were monitored: oxygen       saturation, heart rate, blood pressure, respiratory rate, EKG, adequacy       of pulmonary ventilation, and response to care. Recommendation:           - Patient has a contact number available for                            emergencies. The signs and symptoms of potential                            delayed complications were discussed with the  patient. Return to normal activities tomorrow.                            Written discharge instructions were provided to the                            patient.                           - Resume previous diet.                           - Continue present medications. Resume Eliquis today                           - Repeat colonoscopy date to be determined after                            pending pathology results are reviewed for                            surveillance based on pathology results.                           - Return to GI office (date not yet determined). No                            MRI until clip gone Procedure Code(s):        --- Professional ---                           [phone], Colonoscopy, flexible; with removal of                            tumor(s), polyp(s), or other  lesion(s) by snare                            technique                           45380, 74, Colonoscopy, flexible; with biopsy,                            single or multiple Diagnosis Code(s):        --- Professional ---                           Z86.010, Personal history of colonic polyps                           K63.5, Polyp of colon                           K62.1, Rectal polyp                           K57.30, Diverticulosis of large intestine  without                            perforation or abscess without bleeding CPT copyright 2019 American Medical Association. All rights reserved. The codes documented in this report are preliminary and upon coder review may  be revised to meet current compliance requirements. Cristopher Estimable. Monie Shere, MD Norvel Richards, MD 12/27/2019 8:27:32 AM This report has been signed electronically. Number of Addenda: 0

## 2019-12-27 NOTE — Discharge Instructions (Signed)
Colonoscopy Discharge Instructions  Read the instructions outlined below and refer to this sheet in the next few weeks. These discharge instructions provide you with general information on caring for yourself after you leave the hospital. Your doctor may also give you specific instructions. While your treatment has been planned according to the most current medical practices available, unavoidable complications occasionally occur. If you have any problems or questions after discharge, call Dr. Gala Romney at [phone]. ACTIVITY  You may resume your regular activity, but move at a slower pace for the next 24 hours.   Take frequent rest periods for the next 24 hours.   Walking will help get rid of the air and reduce the bloated feeling in your belly (abdomen).   No driving for 24 hours (because of the medicine (anesthesia) used during the test).    Do not sign any important legal documents or operate any machinery for 24 hours (because of the anesthesia used during the test).  NUTRITION  Drink plenty of fluids.   You may resume your normal diet as instructed by your doctor.   Begin with a light meal and progress to your normal diet. Heavy or fried foods are harder to digest and may make you feel sick to your stomach (nauseated).   Avoid alcoholic beverages for 24 hours or as instructed.  MEDICATIONS  You may resume your normal medications unless your doctor tells you otherwise.  WHAT YOU CAN EXPECT TODAY  Some feelings of bloating in the abdomen.   Passage of more gas than usual.   Spotting of blood in your stool or on the toilet paper.  IF YOU HAD POLYPS REMOVED DURING THE COLONOSCOPY:  No aspirin products for 7 days or as instructed.   No alcohol for 7 days or as instructed.   Eat a soft diet for the next 24 hours.  FINDING OUT THE RESULTS OF YOUR TEST Not all test results are available during your visit. If your test results are not back during the visit, make an appointment  with your caregiver to find out the results. Do not assume everything is normal if you have not heard from your caregiver or the medical facility. It is important for you to follow up on all of your test results.  SEEK IMMEDIATE MEDICAL ATTENTION IF:  You have more than a spotting of blood in your stool.   Your belly is swollen (abdominal distention).   You are nauseated or vomiting.   You have a temperature over 101.   You have abdominal pain or discomfort that is severe or gets worse throughout the day.    Multiple polyps removed in your colon today  Diverticulosis and polyp information provided  No future MRI until clip gone  Further recommendations to follow pending review of pathology report  At patient request, I called Almer Bushey at [phone] -reviewed results  Resume Eliquis today     Monitored Anesthesia Care, Care After These instructions provide you with information about caring for yourself after your procedure. Your health care provider may also give you more specific instructions. Your treatment has been planned according to current medical practices, but problems sometimes occur. Call your health care provider if you have any problems or questions after your procedure. What can I expect after the procedure? After your procedure, you may:  Feel sleepy for several hours.  Feel clumsy and have poor balance for several hours.  Feel forgetful about what happened after the procedure.  Have poor judgment for  several hours.  Feel nauseous or vomit.  Have a sore throat if you had a breathing tube during the procedure. Follow these instructions at home: For at least 24 hours after the procedure:      Have a responsible adult stay with you. It is important to have someone help care for you until you are awake and alert.  Rest as needed.  Do not: ? Participate in activities in which you could fall or become injured. ? Drive. ? Use heavy  machinery. ? Drink alcohol. ? Take sleeping pills or medicines that cause drowsiness. ? Make important decisions or sign legal documents. ? Take care of children on your own. Eating and drinking  Follow the diet that is recommended by your health care provider.  If you vomit, drink water, juice, or soup when you can drink without vomiting.  Make sure you have little or no nausea before eating solid foods. General instructions  Take over-the-counter and prescription medicines only as told by your health care provider.  If you have sleep apnea, surgery and certain medicines can increase your risk for breathing problems. Follow instructions from your health care provider about wearing your sleep device: ? Anytime you are sleeping, including during daytime naps. ? While taking prescription pain medicines, sleeping medicines, or medicines that make you drowsy.  If you smoke, do not smoke without supervision.  Keep all follow-up visits as told by your health care provider. This is important. Contact a health care provider if:  You keep feeling nauseous or you keep vomiting.  You feel light-headed.  You develop a rash.  You have a fever. Get help right away if:  You have trouble breathing. Summary  For several hours after your procedure, you may feel sleepy and have poor judgment.  Have a responsible adult stay with you for at least 24 hours or until you are awake and alert. This information is not intended to replace advice given to you by your health care provider. Make sure you discuss any questions you have with your health care provider. Document Revised: 04/21/2017 Document Reviewed: 05/14/2015 Elsevier Patient Education  [place].      Diverticulosis  Diverticulosis is a condition that develops when small pouches (diverticula) form in the wall of the large intestine (colon). The colon is where water is absorbed and stool (feces) is formed. The pouches form  when the inside layer of the colon pushes through weak spots in the outer layers of the colon. You may have a few pouches or many of them. The pouches usually do not cause problems unless they become inflamed or infected. When this happens, the condition is called diverticulitis. What are the causes? The cause of this condition is not known. What increases the risk? The following factors may make you more likely to develop this condition:  Being older than age 76. Your risk for this condition increases with age. Diverticulosis is rare among people younger than age 87. By age 31, many people have it.  Eating a low-fiber diet.  Having frequent constipation.  Being overweight.  Not getting enough exercise.  Smoking.  Taking over-the-counter pain medicines, like aspirin and ibuprofen.  Having a family history of diverticulosis. What are the signs or symptoms? In most people, there are no symptoms of this condition. If you do have symptoms, they may include:  Bloating.  Cramps in the abdomen.  Constipation or diarrhea.  Pain in the lower left side of the abdomen. How is this  diagnosed? Because diverticulosis usually has no symptoms, it is most often diagnosed during an exam for other colon problems. The condition may be diagnosed by:  Using a flexible scope to examine the colon (colonoscopy).  Taking an X-ray of the colon after dye has been put into the colon (barium enema).  Having a CT scan. How is this treated? You may not need treatment for this condition. Your health care provider may recommend treatment to prevent problems. You may need treatment if you have symptoms or if you previously had diverticulitis. Treatment may include:  Eating a high-fiber diet.  Taking a fiber supplement.  Taking a live bacteria supplement (probiotic).  Taking medicine to relax your colon. Follow these instructions at home: Medicines  Take over-the-counter and prescription medicines  only as told by your health care provider.  If told by your health care provider, take a fiber supplement or probiotic. Constipation prevention Your condition may cause constipation. To prevent or treat constipation, you may need to:  Drink enough fluid to keep your urine pale yellow.  Take over-the-counter or prescription medicines.  Eat foods that are high in fiber, such as beans, whole grains, and fresh fruits and vegetables.  Limit foods that are high in fat and processed sugars, such as fried or sweet foods.  General instructions  Try not to strain when you have a bowel movement.  Keep all follow-up visits as told by your health care provider. This is important. Contact a health care provider if you:  Have pain in your abdomen.  Have bloating.  Have cramps.  Have not had a bowel movement in 3 days. Get help right away if:  Your pain gets worse.  Your bloating becomes very bad.  You have a fever or chills, and your symptoms suddenly get worse.  You vomit.  You have bowel movements that are bloody or black.  You have bleeding from your rectum. Summary  Diverticulosis is a condition that develops when small pouches (diverticula) form in the wall of the large intestine (colon).  You may have a few pouches or many of them.  This condition is most often diagnosed during an exam for other colon problems.  Treatment may include increasing the fiber in your diet, taking supplements, or taking medicines. This information is not intended to replace advice given to you by your health care provider. Make sure you discuss any questions you have with your health care provider. Document Revised: 08/20/2018 Document Reviewed: 08/20/2018 Elsevier Patient Education  [place].     Colon Polyps  Polyps are tissue growths inside the body. Polyps can grow in many places, including the large intestine (colon). A polyp may be a round bump or a mushroom-shaped growth.  You could have one polyp or several. Most colon polyps are noncancerous (benign). However, some colon polyps can become cancerous over time. Finding and removing the polyps early can help prevent this. What are the causes? The exact cause of colon polyps is not known. What increases the risk? You are more likely to develop this condition if you:  Have a family history of colon cancer or colon polyps.  Are older than 57 or older than 45 if you are African American.  Have inflammatory bowel disease, such as ulcerative colitis or Crohn's disease.  Have certain hereditary conditions, such as: ? Familial adenomatous polyposis. ? Lynch syndrome. ? Turcot syndrome. ? Peutz-Jeghers syndrome.  Are overweight.  Smoke cigarettes.  Do not get enough exercise.  Drink too  much alcohol.  Eat a diet that is high in fat and red meat and low in fiber.  Had childhood cancer that was treated with abdominal radiation. What are the signs or symptoms? Most polyps do not cause symptoms. If you have symptoms, they may include:  Blood coming from your rectum when having a bowel movement.  Blood in your stool. The stool may look dark red or black.  Abdominal pain.  A change in bowel habits, such as constipation or diarrhea. How is this diagnosed? This condition is diagnosed with a colonoscopy. This is a procedure in which a lighted, flexible scope is inserted into the anus and then passed into the colon to examine the area. Polyps are sometimes found when a colonoscopy is done as part of routine cancer screening tests. How is this treated? Treatment for this condition involves removing any polyps that are found. Most polyps can be removed during a colonoscopy. Those polyps will then be tested for cancer. Additional treatment may be needed depending on the results of testing. Follow these instructions at home: Lifestyle  Maintain a healthy weight, or lose weight if recommended by your health care  provider.  Exercise every day or as told by your health care provider.  Do not use any products that contain nicotine or tobacco, such as cigarettes and e-cigarettes. If you need help quitting, ask your health care provider.  If you drink alcohol, limit how much you have: ? 0-1 drink a day for women. ? 0-2 drinks a day for men.  Be aware of how much alcohol is in your drink. In the [place] one drink equals one 12 oz bottle of beer (355 mL), one 5 oz glass of wine (148 mL), or one 1 oz shot of hard liquor (44 mL). Eating and drinking   Eat foods that are high in fiber, such as fruits, vegetables, and whole grains.  Eat foods that are high in calcium and vitamin D, such as milk, cheese, yogurt, eggs, liver, fish, and broccoli.  Limit foods that are high in fat, such as fried foods and desserts.  Limit the amount of red meat and processed meat you eat, such as hot dogs, sausage, bacon, and lunch meats. General instructions  Keep all follow-up visits as told by your health care provider. This is important. ? This includes having regularly scheduled colonoscopies. ? Talk to your health care provider about when you need a colonoscopy. Contact a health care provider if:  You have new or worsening bleeding during a bowel movement.  You have new or increased blood in your stool.  You have a change in bowel habits.  You lose weight for no known reason. Summary  Polyps are tissue growths inside the body. Polyps can grow in many places, including the colon.  Most colon polyps are noncancerous (benign), but some can become cancerous over time.  This condition is diagnosed with a colonoscopy.  Treatment for this condition involves removing any polyps that are found. Most polyps can be removed during a colonoscopy. This information is not intended to replace advice given to you by your health care provider. Make sure you discuss any questions you have with your health care  provider. Document Revised: 05/08/2017 Document Reviewed: 05/08/2017 Elsevier Patient Education  [place].

## 2019-12-27 NOTE — H&P (Signed)
@LOGO @   Primary Care Physician:  Redmond School, MD Primary Gastroenterologist:  Dr. Gala Romney Pre-Procedure History & Physical: HPI:  Xavier Caldwell is a 69 y.o. male here for surveillance colonoscopy.  History of multiple colonic adenomas removed 2018.  Since that time acute MI V. fib cardiac arrest stent placement atrial fibrillation now on chronic anticoagulation.  Eliquis held 2 days ago.  Past Medical History:  Diagnosis Date  . Acute systolic (congestive) heart failure ([place])   . Anxiety   . Arthritis   . Atrial fibrillation ([place])   . Back pain   . Coronary artery disease   . Diabetes mellitus without complication ([place])   . High cholesterol   . History of gout   . Hypertension   . Myocardial infarction ([place]) 04/2018  . Neuropathy   . PAD (peripheral artery disease) ([place])   . 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 Sleep apnea    Stop Bang score of 4  . STEMI (ST elevation myocardial infarction) ([place])    04/08/2018 PCI/DES RCA  . VT (ventricular tachycardia) ([place])     Past Surgical History:  Procedure Laterality Date  . APPENDECTOMY    . CARDIOVERSION N/A 04/13/2018   Procedure: CARDIOVERSION;  Surgeon: Skeet Latch, MD;  Location: [facility];  Service: Cardiovascular;  Laterality: N/A;  . CATARACT EXTRACTION W/PHACO Right 04/25/2014   Procedure: CATARACT EXTRACTION PHACO AND INTRAOCULAR LENS PLACEMENT ([place]);  Surgeon: Williams Che, MD;  Location: [facility];  Service: Ophthalmology;  Laterality: Right;  CDE:4.70  . CATARACT EXTRACTION W/PHACO Left 07/11/2014   Procedure: CATARACT EXTRACTION PHACO AND INTRAOCULAR LENS PLACEMENT LEFT EYE CDE=8.32;  Surgeon: Williams Che, MD;  Location: [facility];  Service: Ophthalmology;  Laterality: Left;  . COLONOSCOPY WITH PROPOFOL N/A 09/23/2016   Iman Orourke: 9 polyps removed, multiple tubular adenomas. recommended 3 yr surveillance  . CORONARY/GRAFT ACUTE MI REVASCULARIZATION N/A 04/07/2018   Procedure:  Coronary/Graft Acute MI Revascularization;  Surgeon: Burnell Blanks, MD;  Location: [facility];  Service: Cardiovascular;  Laterality: N/A;  . LEFT HEART CATH AND CORONARY ANGIOGRAPHY N/A 04/07/2018   Procedure: LEFT HEART CATH AND CORONARY ANGIOGRAPHY;  Surgeon: Burnell Blanks, MD;  Location: [facility];  Service: Cardiovascular;  Laterality: N/A;  . MELANOMA EXCISION  07/2019  . POLYPECTOMY  09/23/2016   Procedure: POLYPECTOMY;  Surgeon: Daneil Dolin, MD;  [facility];  Service: Endoscopy;;  colon  . RETINAL DETACHMENT SURGERY      Prior to Admission medications   Medication Sig Start Date End Date Taking? Authorizing Provider  ALPRAZolam Duanne Moron) 0.5 MG tablet Take 0.5 mg by mouth 2 (two) times daily as needed for anxiety.   Yes [provider]  apixaban (ELIQUIS) 5 MG TABS tablet Take 1 tablet (5 mg total) by mouth 2 (two) times daily. 04/05/19  Yes BranchAlphonse Guild, MD  atorvastatin (LIPITOR) 80 MG tablet Take 1 tablet (80 mg total) by mouth daily. 08/31/19 12/21/19 Yes Branch, Alphonse Guild, MD  B Complex-C (SUPER B COMPLEX PO) Take 1 tablet by mouth daily.    Yes [provider]  carvedilol (COREG) 6.25 MG tablet Take 1 tablet (6.25 mg total) by mouth 2 (two) times daily. 08/31/19  Yes BranchAlphonse Guild, MD  DULoxetine (CYMBALTA) 60 MG capsule Take 60 mg by mouth daily with lunch.    Yes [provider]  ergocalciferol (VITAMIN D2) 50000 units capsule  Take 50,000 Units by mouth every Sunday.    Yes [provider]  gabapentin (NEURONTIN) 300 MG capsule Take 300 mg by mouth 2 (two) times daily.  02/23/19  Yes [provider]  HYDROcodone-acetaminophen (NORCO/VICODIN) 5-325 MG per tablet Take 1 tablet by mouth every 6 (six) hours as needed for moderate pain.   Yes [provider]  Insulin Glargine (BASAGLAR KWIKPEN) 100 UNIT/ML Inject 35 Units into the skin at bedtime. 11/09/19  Yes Brita Romp, NP  linaclotide [facility]) 145 MCG CAPS capsule Take 1 capsule (145 mcg total) by mouth daily before breakfast. 10/05/19  Yes Mahala Menghini, PA-C  lisinopril (ZESTRIL) 40 MG tablet Take 1 tablet (40 mg total) by mouth daily. 03/12/19 12/21/19 Yes BranchAlphonse Guild, MD  lisinopril (ZESTRIL) 40 MG tablet Take 40 mg by mouth daily.   Yes [provider]  metFORMIN (GLUCOPHAGE) 1000 MG tablet TAKE ONE TABLET BY MOUTH TWICE DAILY Patient taking differently: Take 1,000 mg by mouth 2 (two) times daily.  11/17/19  Yes Reardon, Juanetta Beets, NP  nitroGLYCERIN (NITROSTAT) 0.4 MG SL tablet Place 1 tablet (0.4 mg total) under the tongue every 5 (five) minutes as needed. Patient taking differently: Place 0.4 mg under the tongue every 5 (five) minutes as needed for chest pain.  03/09/19  Yes Branch, Alphonse Guild, MD  rOPINIRole (REQUIP) 1 MG tablet Take 1 mg by mouth 4 (four) times daily.    Yes [provider]  timolol (TIMOPTIC) 0.5 % ophthalmic solution INSTILL 1 DROP INTO EACH EYE TWICE DAILY Patient taking differently: Place 1 drop into both eyes 2 (two) times daily.  08/24/19  Yes Rankin, Clent Demark, MD  UNIFINE PENTIPS 31G X 5 MM MISC USE TO INJECT INSULIN AT BEDTIME Patient taking differently: at bedtime.  06/10/16  Yes Nida, Marella Chimes, MD  glucose blood (ONETOUCH ULTRA) test strip USE ONE TEST STRIP AS DIRECTED TWICE DAILY 07/29/18   Cassandria Anger, MD    Allergies as of 10/13/2019  . (No Known Allergies)    Family History  Problem Relation Age of Onset  . Hypertension Father   . Colon cancer Neg Hx     Social History   Socioeconomic History  . Marital status: Married    Spouse name: Not on file  . Number of children: Not on file  . Years of education: Not on file  . Highest education level: Not on file  Occupational History  . Not on file  Tobacco Use  . Smoking status: Former Smoker    Packs/day: 1.50    Years: 30.00    Pack years: 45.00    Types: Cigarettes     Quit date: 07/05/2012    Years since quitting: 7.4  . Smokeless tobacco: Never Used  Vaping Use  . Vaping Use: Never used  Substance and Sexual Activity  . Alcohol use: No  . Drug use: No  . Sexual activity: Not Currently    Birth control/protection: None  Other Topics Concern  . Not on file  Social History Narrative  . Not on file   Social Determinants of Health   Financial Resource Strain:   . Difficulty of Paying Living Expenses: Not on file  Food Insecurity:   . Worried About Charity fundraiser in the Last Year: Not on file  . Ran Out of Food in the Last Year: Not on file  Transportation Needs:   . Lack of Transportation (Medical): Not on file  .  Lack of Transportation (Non-Medical): Not on file  Physical Activity:   . Days of Exercise per Week: Not on file  . Minutes of Exercise per Session: Not on file  Stress:   . Feeling of Stress : Not on file  Social Connections:   . Frequency of Communication with Friends and Family: Not on file  . Frequency of Social Gatherings with Friends and Family: Not on file  . Attends Religious Services: Not on file  . Active Member of Clubs or Organizations: Not on file  . Attends Archivist Meetings: Not on file  . Marital Status: Not on file  Intimate Partner Violence:   . Fear of Current or Ex-Partner: Not on file  . Emotionally Abused: Not on file  . Physically Abused: Not on file  . Sexually Abused: Not on file    Review of Systems: See HPI, otherwise negative ROS  Physical Exam: BP (!) 146/73 (BP Location: Right Arm)   Pulse 73   Temp 98.1 F (36.7 C)   Resp (!) 22   SpO2 99%  General:   Alert,  Well-developed, well-nourished, pleasant and cooperative in NAD Neck:  Supple; no masses or thyromegaly. No significant cervical adenopathy. Lungs:  Clear throughout to auscultation.   No wheezes, crackles, or rhonchi. No acute distress. Heart:  Regular rate and rhythm; no murmurs, clicks, rubs,  or  gallops. Abdomen: Non-distended, normal bowel sounds.  Soft and nontender without appreciable mass or hepatosplenomegaly.  Pulses:  Normal pulses noted. Extremities:  Without clubbing or edema.  Impression/Plan: 69 year old gentleman history of multiple colonic adenomas removed 2018.  Here for surveillance colonoscopy.  Significant cardiac events since 2018 as outlined above.  Anticoagulation has been held. I have offered the patient a surveillance colonoscopy per plan. The risks, benefits, limitations, alternatives and imponderables have been reviewed with the patient. Questions have been answered. All parties are agreeable.      Notice: This dictation was prepared with Dragon dictation along with smaller phrase technology. Any transcriptional errors that result from this process are unintentional and may not be corrected upon review.

## 2019-12-27 NOTE — Transfer of Care (Signed)
Immediate Anesthesia Transfer of Care Note  Patient: Xavier Caldwell  Procedure(s) Performed: COLONOSCOPY WITH PROPOFOL (N/A ) POLYPECTOMY  Patient Location: PACU  Anesthesia Type:General  Level of Consciousness: awake, alert  and oriented  Airway & Oxygen Therapy: Patient Spontanous Breathing  Post-op Assessment: Report given to RN and Post -op Vital signs reviewed and stable  Post vital signs: Reviewed and stable  Last Vitals:  Vitals Value Taken Time  BP 120/57 12/27/19 0820  Temp    Pulse 71 12/27/19 0821  Resp 20 12/27/19 0821  SpO2 100 % 12/27/19 0821  Vitals shown include unvalidated device data.  Last Pain:  Vitals:   12/27/19 0645  PainSc: 0-No pain         Complications: No complications documented.

## 2019-12-27 NOTE — Anesthesia Postprocedure Evaluation (Signed)
Anesthesia Post Note  Patient: Xavier Caldwell  Procedure(s) Performed: COLONOSCOPY WITH PROPOFOL (N/A ) POLYPECTOMY  Patient location during evaluation: PACU Anesthesia Type: General Level of consciousness: awake, oriented and awake and alert Pain management: pain level controlled Vital Signs Assessment: post-procedure vital signs reviewed and stable Respiratory status: spontaneous breathing, respiratory function stable and nonlabored ventilation Cardiovascular status: blood pressure returned to baseline and stable Postop Assessment: no apparent nausea or vomiting Anesthetic complications: no   No complications documented.   Last Vitals:  Vitals:   12/27/19 0645  BP: (!) 146/73  Pulse: 73  Resp: (!) 22  Temp: 36.7 C  SpO2: 99%    Last Pain:  Vitals:   12/27/19 0645  PainSc: 0-No pain                 Karna Dupes

## 2019-12-27 NOTE — Anesthesia Preprocedure Evaluation (Signed)
Anesthesia Evaluation  Patient identified by MRN, date of birth, ID band Patient awake    Reviewed: Allergy & Precautions, NPO status , Patient's Chart, lab work & pertinent test results, reviewed documented beta blocker date and time   History of Anesthesia Complications Negative for: history of anesthetic complications  Airway Mallampati: III  TM Distance: >3 FB Neck ROM: Full    Dental  (+) Dental Advisory Given, Missing,    Pulmonary sleep apnea , former smoker,    Pulmonary exam normal breath sounds clear to auscultation       Cardiovascular Exercise Tolerance: Good hypertension, Pt. on medications + angina + CAD, + Past MI, + Cardiac Stents, + Peripheral Vascular Disease and +CHF  Normal cardiovascular exam+ dysrhythmias Atrial Fibrillation and Ventricular Tachycardia  Rhythm:Regular Rate:Normal  04/2018  Prox RCA lesion is 100% stenosed.  Ost LAD to Prox LAD lesion is 30% stenosed.  Mid LAD lesion is 40% stenosed.  Ost 2nd Mrg to 2nd Mrg lesion is 40% stenosed.  A drug-eluting stent was successfully placed using a STENT RESOLUTE ONYX 3.5X30.  Post intervention, there is a 0% residual stenosis.   1. 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 Ventricular fibrillation/cardiac arrest with successful resuscitation 3. Successful PTCA/DES x 1 proximal RCA.  4. Successful aspiration thrombectomy distal RCA 5. Mild to moderate non-obstructive disease in the Circumflex and LAD  Recommendations: Will admit to ICU. Echo in the am. Will continue Aggrastat for 18 hours. Will continue ASA/Brilinta and statin. Will not start a beta blocker today given hypotension and bradycardia. Ace inh on hold with hypotension.     1. The left ventricle has mildly reduced systolic function, with an ejection fraction of 45-50%. The cavity size was normal. There is moderately increased left ventricular wall  thickness. 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 Left ventricular diffuse hypokinesis.  2. The right ventricle has mildly reduced systolic function. The cavity  was mildly enlarged. There is no increase in right ventricular wall  thickness.  3. The mitral valve is degenerative. There is mild mitral annular  calcification present.  4. The aortic valve was not well visualized.  5. The aortic root and ascending aorta are normal in size and structure.  6. The inferior vena cava was dilated in size with <50% respiratory variability.  7. Severe hypokinesis of the left ventricular basal to mid inferior and  inferoseptal.  8. The interatrial septum was not well visualized   Neuro/Psych Anxiety    GI/Hepatic Neg liver ROS, Bowel prep,  Endo/Other  diabetes, Well Controlled, Type 2, Oral Hypoglycemic Agents, Insulin Dependent  Renal/GU negative Renal ROS     Musculoskeletal  (+) Arthritis  (back pain, gout),   Abdominal   Peds  Hematology   Anesthesia Other Findings Retinal detachment   Reproductive/Obstetrics                            Anesthesia Physical Anesthesia Plan  ASA: III  Anesthesia Plan: General   Post-op Pain Management:    Induction: Intravenous  PONV Risk Score and Plan: Treatment may vary due to age or medical condition  Airway Management Planned: Nasal Cannula, Natural Airway and Simple Face Mask  Additional Equipment:   Intra-op Plan:   Post-operative Plan:   Informed Consent: I have reviewed the patients History and Physical, chart, labs and discussed the  procedure including the risks, benefits and alternatives for the proposed anesthesia with the patient or authorized representative who has indicated his/her understanding and acceptance.     Dental advisory given  Plan Discussed with: CRNA and Surgeon  Anesthesia Plan  Comments:         Anesthesia Quick Evaluation

## 2019-12-28 LAB — SURGICAL PATHOLOGY

## 2019-12-29 ENCOUNTER — Encounter: Payer: Self-pay | Admitting: Internal Medicine

## 2020-01-03 ENCOUNTER — Encounter (HOSPITAL_COMMUNITY): Payer: Self-pay | Admitting: Internal Medicine

## 2020-01-04 ENCOUNTER — Encounter: Payer: Self-pay | Admitting: *Deleted

## 2020-01-04 ENCOUNTER — Ambulatory Visit: Payer: Medicare HMO | Admitting: Cardiology

## 2020-01-04 ENCOUNTER — Encounter: Payer: Self-pay | Admitting: Cardiology

## 2020-01-04 VITALS — BP 166/100 | HR 72 | Ht 71.0 in | Wt 211.0 lb

## 2020-01-04 DIAGNOSIS — I4891 Unspecified atrial fibrillation: Secondary | ICD-10-CM | POA: Diagnosis not present

## 2020-01-04 DIAGNOSIS — I10 Essential (primary) hypertension: Secondary | ICD-10-CM | POA: Diagnosis not present

## 2020-01-04 DIAGNOSIS — I130 Hypertensive heart and chronic kidney disease with heart failure and stage 1 through stage 4 chronic kidney disease, or unspecified chronic kidney disease: Secondary | ICD-10-CM | POA: Diagnosis not present

## 2020-01-04 DIAGNOSIS — I2510 Atherosclerotic heart disease of native coronary artery without angina pectoris: Secondary | ICD-10-CM | POA: Diagnosis not present

## 2020-01-04 DIAGNOSIS — E1122 Type 2 diabetes mellitus with diabetic chronic kidney disease: Secondary | ICD-10-CM | POA: Diagnosis not present

## 2020-01-04 DIAGNOSIS — N181 Chronic kidney disease, stage 1: Secondary | ICD-10-CM | POA: Diagnosis not present

## 2020-01-04 DIAGNOSIS — I5023 Acute on chronic systolic (congestive) heart failure: Secondary | ICD-10-CM | POA: Diagnosis not present

## 2020-01-04 MED ORDER — CARVEDILOL 12.5 MG PO TABS: 12.5000 mg | ORAL_TABLET | Freq: Two times a day (BID) | ORAL | 1 refills | Status: DC

## 2020-01-04 NOTE — Patient Instructions (Signed)
Your physician wants you to follow-up in: [place] will receive a reminder letter in the mail two months in advance. If you don't receive a letter, please call our office to schedule the follow-up appointment.  Your physician has recommended you make the following change in your medication:   INCREASE COREG 12.5 MG TWICE DAILY   Thank you for choosing [facility]!!  \

## 2020-01-04 NOTE — Progress Notes (Signed)
Clinical Summary Xavier Caldwell is a 69 y.o.male  seen today for follow up of the following medical problem.s   1. CAD - admit 04/2018 with inferior STEMI - received DES to RCA.Complicated VT arrest requiring defib in the cath lab and by bradycardia, required temp pacer. Beta blocker not started due to bradycardia Due to afib was on triple therapy ASA/plavix/DOAC - 04/2018 echo LVEF 45-50%,    - no recent chest pain. No SOB or doe - compliant withmeds    2. Afib - new diagnosis during 04/2018 admission with STEMI - management complicated by bradycardia - was on admiodarone, s/p DCCV. Amiodarone was discontinued at outpatient f/u - started on eliquis    - no recent palpitations - no bleeding on eliquis  3. HTN -compliant with meds Home sbp's 110s-160s  4. Hyperlipemia 06/2019 TC 132 TG 115 HDL 34 LDL 77 After this panel we increased his atorva to 80mg  daily.    5. Carotid stenosis - followed by vascular Dr Xavier Caldwell    [facility]; has had covid vaccine x2 pfizer. I suggested a booster today.    Past Medical History:  Diagnosis Date  . Acute systolic (congestive) heart failure ([place])   . Anxiety   . Arthritis   . Atrial fibrillation ([place])   . Back pain   . Coronary artery disease   . Diabetes mellitus without complication ([place])   . High cholesterol   . History of gout   . Hypertension   . Myocardial infarction ([place]) 04/2018  . Neuropathy   . PAD (peripheral artery disease) ([place])   . 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 Sleep apnea    Stop Bang score of 4  . STEMI (ST elevation myocardial infarction) ([place])    04/08/2018 PCI/DES RCA  . VT (ventricular tachycardia) ([facility])      No Known Allergies   Current Outpatient Medications  Medication Sig Dispense Refill  . ALPRAZolam (XANAX) 0.5 MG tablet Take 0.5 mg by mouth 2 (two) times daily as needed for anxiety.    Marland Kitchen apixaban (ELIQUIS) 5 MG TABS tablet Take 1 tablet (5 mg total) by  mouth 2 (two) times daily. 60 tablet 6  . atorvastatin (LIPITOR) 80 MG tablet Take 1 tablet (80 mg total) by mouth daily. 90 tablet 3  . B Complex-C (SUPER B COMPLEX PO) Take 1 tablet by mouth daily.     . carvedilol (COREG) 6.25 MG tablet Take 1 tablet (6.25 mg total) by mouth 2 (two) times daily. 180 tablet 3  . DULoxetine (CYMBALTA) 60 MG capsule Take 60 mg by mouth daily with lunch.     . ergocalciferol (VITAMIN D2) 50000 units capsule Take 50,000 Units by mouth every Sunday.     . gabapentin (NEURONTIN) 300 MG capsule Take 300 mg by mouth 2 (two) times daily.     Marland Kitchen glucose blood (ONETOUCH ULTRA) test strip USE ONE TEST STRIP AS DIRECTED TWICE DAILY 100 each 0  . HYDROcodone-acetaminophen (NORCO/VICODIN) 5-325 MG per tablet Take 1 tablet by mouth every 6 (six) hours as needed for moderate pain.    . Insulin Glargine (BASAGLAR KWIKPEN) 100 UNIT/ML Inject 35 Units into the skin at bedtime. 21 mL 3  . linaclotide (LINZESS) 145 MCG CAPS capsule Take 1 capsule (145 mcg total) by mouth daily before breakfast. 30 capsule 3  . lisinopril (ZESTRIL) 40 MG tablet Take 1 tablet (40 mg total) by mouth daily. [place]  tablet 3  . lisinopril (ZESTRIL) 40 MG tablet Take 40 mg by mouth daily.    . metFORMIN (GLUCOPHAGE) 1000 MG tablet TAKE ONE TABLET BY MOUTH TWICE DAILY (Patient taking differently: Take 1,000 mg by mouth 2 (two) times daily. ) 60 tablet 5  . nitroGLYCERIN (NITROSTAT) 0.4 MG SL tablet Place 1 tablet (0.4 mg total) under the tongue every 5 (five) minutes as needed. (Patient taking differently: Place 0.4 mg under the tongue every 5 (five) minutes as needed for chest pain. ) 25 tablet 2  . rOPINIRole (REQUIP) 1 MG tablet Take 1 mg by mouth 4 (four) times daily.     . timolol (TIMOPTIC) 0.5 % ophthalmic solution INSTILL 1 DROP INTO EACH EYE TWICE DAILY (Patient taking differently: Place 1 drop into both eyes 2 (two) times daily. ) 10 mL 10  . UNIFINE PENTIPS 31G X 5 MM MISC USE TO INJECT INSULIN AT  BEDTIME (Patient taking differently: at bedtime. ) 100 each 5   No current facility-administered medications for this visit.     Past Surgical History:  Procedure Laterality Date  . APPENDECTOMY    . CARDIOVERSION N/A 04/13/2018   Procedure: CARDIOVERSION;  Surgeon: Skeet Latch, MD;  Location: [place];  Service: Cardiovascular;  Laterality: N/A;  . CATARACT EXTRACTION W/PHACO Right 04/25/2014   Procedure: CATARACT EXTRACTION PHACO AND INTRAOCULAR LENS PLACEMENT ([place]);  Surgeon: Williams Che, MD;  [facility];  Service: Ophthalmology;  Laterality: Right;  CDE:4.70  . CATARACT EXTRACTION W/PHACO Left 07/11/2014   Procedure: CATARACT EXTRACTION PHACO AND INTRAOCULAR LENS PLACEMENT LEFT EYE CDE=8.32;  Surgeon: Williams Che, MD;  [facility]: [facility];  Service: Ophthalmology;  Laterality: Left;  . COLONOSCOPY WITH PROPOFOL N/A 09/23/2016   [place]: 9 polyps removed, multiple tubular adenomas. recommended 3 yr surveillance  . COLONOSCOPY WITH PROPOFOL N/A 12/27/2019   Procedure: COLONOSCOPY WITH PROPOFOL;  Surgeon: Daneil Dolin, MD;  [facility];  Service: Endoscopy;  Laterality: N/A;  7:30am  . CORONARY/GRAFT ACUTE MI REVASCULARIZATION N/A 04/07/2018   Procedure: Coronary/Graft Acute MI Revascularization;  Surgeon: Burnell Blanks, MD;  Location: [place] [facility];  Service: Cardiovascular;  Laterality: N/A;  . LEFT HEART CATH AND CORONARY ANGIOGRAPHY N/A 04/07/2018   Procedure: LEFT HEART CATH AND CORONARY ANGIOGRAPHY;  Surgeon: Burnell Blanks, MD;  Location: [place] [facility];  Service: Cardiovascular;  Laterality: N/A;  . MELANOMA EXCISION  07/2019  . POLYPECTOMY  09/23/2016   Procedure: POLYPECTOMY;  Surgeon: Daneil Dolin, MD;  [facility];  Service: Endoscopy;;  colon  . POLYPECTOMY  12/27/2019   Procedure: POLYPECTOMY;  Surgeon: Daneil Dolin, MD;  [facility];  Service: Endoscopy;;  . RETINAL DETACHMENT SURGERY        No Known Allergies    Family History  Problem Relation Age of Onset  . Hypertension Father   . Colon cancer Neg Hx      Social History [name] reports that he quit smoking about 7 years ago. His smoking use included cigarettes. He has a 45.00 pack-year smoking history. He has never used smokeless tobacco. Mr. Stirewalt reports no history of alcohol use.   Review of Systems CONSTITUTIONAL: No weight loss, fever, chills, weakness or fatigue.  HEENT: Eyes: No visual loss, blurred vision, double vision or yellow sclerae.No hearing loss, sneezing, congestion, runny nose or sore throat.  SKIN: No rash or itching.  CARDIOVASCULAR: per hpi RESPIRATORY: No shortness of breath, cough or  sputum.  GASTROINTESTINAL: No anorexia, nausea, vomiting or diarrhea. No abdominal pain or blood.  GENITOURINARY: No burning on urination, no polyuria NEUROLOGICAL: No headache, dizziness, syncope, paralysis, ataxia, numbness or tingling in the extremities. No change in bowel or bladder control.  MUSCULOSKELETAL: No muscle, back pain, joint pain or stiffness.  LYMPHATICS: No enlarged nodes. No history of splenectomy.  PSYCHIATRIC: No history of depression or anxiety.  ENDOCRINOLOGIC: No reports of sweating, cold or heat intolerance. No polyuria or polydipsia.  Marland Kitchen   Physical Examination Today's Vitals   01/04/20 0914  BP: (!) 166/100  Pulse: 72  SpO2: 95%  Weight: 211 lb (95.7 kg)  Height: 5\' 11"  (1.803 m)   Body mass index is 29.43 kg/m.  Gen: resting comfortably, no acute distress HEENT: no scleral icterus, pupils equal round and reactive, no palptable cervical adenopathy,  CV: RRR, no mrg, no jvd Resp: Clear to auscultation bilaterally GI: abdomen is soft, non-tender, non-distended, normal bowel sounds, no hepatosplenomegaly MSK: extremities are warm, no edema.  Skin: warm, no rash Neuro:  no focal deficits Psych: appropriate affect   Diagnostic Studies Cath: 04/07/2018   Prox RCA  lesion is 100% stenosed.  Ost LAD to Prox LAD lesion is 30% stenosed.  Mid LAD lesion is 40% stenosed.  Ost 2nd Mrg to 2nd Mrg lesion is 40% stenosed.  A drug-eluting stent was successfully placed using a STENT RESOLUTE ONYX 3.5X30.  Post intervention, there is a 0% residual stenosis.  1. 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 Ventricular fibrillation/cardiac arrest with successful resuscitation 3. Successful PTCA/DES x 1 proximal RCA.  4. Successful aspiration thrombectomy distal RCA 5. Mild to moderate non-obstructive disease in the Circumflex and LAD  Recommendations: Will admit to ICU. Echo in the am. Will continue Aggrastat for 18 hours. Will continue ASA/Brilinta and statin. Will not start a beta blocker today given hypotension and bradycardia. Ace inh on hold with hypotension.  TTE: 04/08/2018  IMPRESSIONS   1. The left ventricle has mildly reduced systolic function, with an ejection fraction of 45-50%. The cavity size was normal. There is moderately increased left ventricular wall thickness. 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 Left ventricular diffuse hypokinesis. 2. The right ventricle has mildly reduced systolic function. The cavity was mildly enlarged. There is no increase in right ventricular wall thickness. 3. The mitral valve is degenerative. There is mild mitral annular calcification present. 4. The aortic valve was not well visualized. 5. The aortic root and ascending aorta are normal in size and structure. 6. The inferior vena cava was dilated in size with <50% respiratory variability. 7. Severe hypokinesis of the left ventricular basal to mid inferior and inferoseptal. 8. The interatrial septum was not well visualized.    Assessment and Plan  1. CAD - no symptoms, continue current meds  2. PAF - no symptoms,continue  current meds  3. HTN -bp remains above goal, increase coreg to 12.5mg  bid.    F/u 6 months  Arnoldo Lenis, M.D.

## 2020-01-07 DIAGNOSIS — R69 Illness, unspecified: Secondary | ICD-10-CM | POA: Diagnosis not present

## 2020-01-24 DIAGNOSIS — R69 Illness, unspecified: Secondary | ICD-10-CM | POA: Diagnosis not present

## 2020-01-24 DIAGNOSIS — E663 Overweight: Secondary | ICD-10-CM | POA: Diagnosis not present

## 2020-01-24 DIAGNOSIS — G894 Chronic pain syndrome: Secondary | ICD-10-CM | POA: Diagnosis not present

## 2020-01-24 DIAGNOSIS — Z6829 Body mass index (BMI) 29.0-29.9, adult: Secondary | ICD-10-CM | POA: Diagnosis not present

## 2020-01-26 DIAGNOSIS — Z1283 Encounter for screening for malignant neoplasm of skin: Secondary | ICD-10-CM | POA: Diagnosis not present

## 2020-01-26 DIAGNOSIS — Z85820 Personal history of malignant melanoma of skin: Secondary | ICD-10-CM | POA: Diagnosis not present

## 2020-01-26 DIAGNOSIS — L0202 Furuncle of face: Secondary | ICD-10-CM | POA: Diagnosis not present

## 2020-01-26 DIAGNOSIS — D485 Neoplasm of uncertain behavior of skin: Secondary | ICD-10-CM | POA: Diagnosis not present

## 2020-01-26 DIAGNOSIS — D225 Melanocytic nevi of trunk: Secondary | ICD-10-CM | POA: Diagnosis not present

## 2020-01-26 DIAGNOSIS — B9689 Other specified bacterial agents as the cause of diseases classified elsewhere: Secondary | ICD-10-CM | POA: Diagnosis not present

## 2020-01-26 DIAGNOSIS — Z08 Encounter for follow-up examination after completed treatment for malignant neoplasm: Secondary | ICD-10-CM | POA: Diagnosis not present

## 2020-02-04 DIAGNOSIS — I5023 Acute on chronic systolic (congestive) heart failure: Secondary | ICD-10-CM | POA: Diagnosis not present

## 2020-02-04 DIAGNOSIS — N181 Chronic kidney disease, stage 1: Secondary | ICD-10-CM | POA: Diagnosis not present

## 2020-02-04 DIAGNOSIS — E1122 Type 2 diabetes mellitus with diabetic chronic kidney disease: Secondary | ICD-10-CM | POA: Diagnosis not present

## 2020-02-04 DIAGNOSIS — I130 Hypertensive heart and chronic kidney disease with heart failure and stage 1 through stage 4 chronic kidney disease, or unspecified chronic kidney disease: Secondary | ICD-10-CM | POA: Diagnosis not present

## 2020-02-23 DIAGNOSIS — D225 Melanocytic nevi of trunk: Secondary | ICD-10-CM | POA: Diagnosis not present

## 2020-02-23 DIAGNOSIS — Z1283 Encounter for screening for malignant neoplasm of skin: Secondary | ICD-10-CM | POA: Diagnosis not present

## 2020-02-23 DIAGNOSIS — D485 Neoplasm of uncertain behavior of skin: Secondary | ICD-10-CM | POA: Diagnosis not present

## 2020-02-29 DIAGNOSIS — Z6830 Body mass index (BMI) 30.0-30.9, adult: Secondary | ICD-10-CM | POA: Diagnosis not present

## 2020-02-29 DIAGNOSIS — E1165 Type 2 diabetes mellitus with hyperglycemia: Secondary | ICD-10-CM | POA: Diagnosis not present

## 2020-02-29 DIAGNOSIS — M5030 Other cervical disc degeneration, unspecified cervical region: Secondary | ICD-10-CM | POA: Diagnosis not present

## 2020-02-29 DIAGNOSIS — E119 Type 2 diabetes mellitus without complications: Secondary | ICD-10-CM | POA: Diagnosis not present

## 2020-02-29 DIAGNOSIS — E6609 Other obesity due to excess calories: Secondary | ICD-10-CM | POA: Diagnosis not present

## 2020-02-29 DIAGNOSIS — E118 Type 2 diabetes mellitus with unspecified complications: Secondary | ICD-10-CM | POA: Diagnosis not present

## 2020-02-29 DIAGNOSIS — E7849 Other hyperlipidemia: Secondary | ICD-10-CM | POA: Diagnosis not present

## 2020-02-29 DIAGNOSIS — Z794 Long term (current) use of insulin: Secondary | ICD-10-CM | POA: Diagnosis not present

## 2020-03-01 LAB — MICROALBUMIN / CREATININE URINE RATIO
Creatinine, Urine: 25.5 mg/dL
Microalb/Creat Ratio: 29 mg/g creat (ref 0–29)
Microalbumin, Urine: 7.3 ug/mL

## 2020-03-01 LAB — COMPREHENSIVE METABOLIC PANEL
ALT: 18 IU/L (ref 0–44)
AST: 20 IU/L (ref 0–40)
Albumin/Globulin Ratio: 1.4 (ref 1.2–2.2)
Albumin: 4 g/dL (ref 3.8–4.8)
Alkaline Phosphatase: 92 IU/L (ref 44–121)
BUN/Creatinine Ratio: 17 (ref 10–24)
BUN: 37 mg/dL — ABNORMAL HIGH (ref 8–27)
Bilirubin Total: 0.4 mg/dL (ref 0.0–1.2)
CO2: 18 mmol/L — ABNORMAL LOW (ref 20–29)
Calcium: 9.3 mg/dL (ref 8.6–10.2)
Chloride: 104 mmol/L (ref 96–106)
Creatinine, Ser: 2.16 mg/dL — ABNORMAL HIGH (ref 0.76–1.27)
GFR calc Af Amer: 35 mL/min/{1.73_m2} — ABNORMAL LOW (ref >59)
GFR calc non Af Amer: 31 mL/min/{1.73_m2} — ABNORMAL LOW (ref >59)
Globulin, Total: 2.8 g/dL (ref 1.5–4.5)
Glucose: 77 mg/dL (ref 65–99)
Potassium: 6.2 mmol/L — ABNORMAL HIGH (ref 3.5–5.2)
Sodium: 137 mmol/L (ref 134–144)
Total Protein: 6.8 g/dL (ref 6.0–8.5)

## 2020-03-01 LAB — LIPID PANEL
Chol/HDL Ratio: 3.3 ratio (ref 0.0–5.0)
Cholesterol, Total: 125 mg/dL (ref 100–199)
HDL: 38 mg/dL — ABNORMAL LOW (ref >39)
LDL Chol Calc (NIH): 70 mg/dL (ref 0–99)
Triglycerides: 85 mg/dL (ref 0–149)
VLDL Cholesterol Cal: 17 mg/dL (ref 5–40)

## 2020-03-01 LAB — TSH: TSH: 0.166 u[IU]/mL — ABNORMAL LOW (ref 0.450–4.500)

## 2020-03-01 LAB — T4, FREE: Free T4: 1.2 ng/dL (ref 0.82–1.77)

## 2020-03-02 ENCOUNTER — Encounter: Payer: Self-pay | Admitting: Internal Medicine

## 2020-03-02 ENCOUNTER — Ambulatory Visit: Payer: Medicare HMO | Admitting: Gastroenterology

## 2020-03-02 ENCOUNTER — Telehealth: Payer: Self-pay | Admitting: Cardiology

## 2020-03-02 ENCOUNTER — Other Ambulatory Visit: Payer: Self-pay

## 2020-03-02 ENCOUNTER — Encounter: Payer: Self-pay | Admitting: Gastroenterology

## 2020-03-02 VITALS — BP 184/75 | HR 60 | Temp 97.1°F | Ht 70.0 in | Wt 221.6 lb

## 2020-03-02 DIAGNOSIS — K5900 Constipation, unspecified: Secondary | ICD-10-CM | POA: Diagnosis not present

## 2020-03-02 NOTE — Telephone Encounter (Signed)
Spoke with wife who states that pt BP on this morning before taking his meds. Pt BP was 200/100 on . 2 Hours later after taking meds (Lisinopril, coreg)  his BP was 139/69. Last night his BP was 177/80. Pt BP 125/73 on Wednesday morning. Wife reports that pt is dizzy in the mornings when he wakes up. Please advise.

## 2020-03-02 NOTE — Telephone Encounter (Signed)
Verify taking coreg bid. I would try taking the lisinopril 20mg  bid instead of 40mg  in the AM to see if spreading it out levels out the bp's  [name] MD

## 2020-03-02 NOTE — Telephone Encounter (Signed)
Returned call to Pt, no answer, left msg to call back.

## 2020-03-02 NOTE — Progress Notes (Signed)
Referring Provider: Redmond School, MD Primary Care Physician:  Redmond School, MD Primary GI: Dr. Gala Romney   Chief Complaint  Patient presents with  . Constipation    Had to stop linzess because he started back working and it makes him go to the bathroom    HPI:   Xavier Caldwell is a 70 y.o. male presenting today with a history of constipation, multiple adenomas in past and most recent colonoscopy with multiple adenomas and surveillance due in 2024.    Has soft, squishy stool several times a day since colonoscopy. Feels like after going a second time, he still has to go and only a very little. Feels like he needs to do more but can't. Happens daily. Linzess helped with better output.  Eats one meal a day and snacks rest of time. Not taking Linzess every day.     Past Medical History:  Diagnosis Date  . Acute systolic (congestive) heart failure ([place])   . Anxiety   . Arthritis   . Atrial fibrillation ([place])   . Back pain   . Coronary artery disease   . Diabetes mellitus without complication ([place])   . High cholesterol   . History of gout   . Hypertension   . Myocardial infarction ([place]) 04/2018  . Neuropathy   . PAD (peripheral artery disease) ([place])   . 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 Sleep apnea    Stop Bang score of 4  . STEMI (ST elevation myocardial infarction) ([place])    04/08/2018 PCI/DES RCA  . VT (ventricular tachycardia) ([place])     Past Surgical History:  Procedure Laterality Date  . APPENDECTOMY    . CARDIOVERSION N/A 04/13/2018   Procedure: CARDIOVERSION;  Surgeon: Skeet Latch, MD;  Location: [place];  Service: Cardiovascular;  Laterality: N/A;  . CATARACT EXTRACTION W/PHACO Right 04/25/2014   Procedure: CATARACT EXTRACTION PHACO AND INTRAOCULAR LENS PLACEMENT ([place]);  Surgeon: Williams Che, MD;  [facility];  Service: Ophthalmology;  Laterality: Right;  CDE:4.70  . CATARACT EXTRACTION W/PHACO Left 07/11/2014    Procedure: CATARACT EXTRACTION PHACO AND INTRAOCULAR LENS PLACEMENT LEFT EYE CDE=8.32;  Surgeon: Williams Che, MD;  [facility];  Service: Ophthalmology;  Laterality: Left;  . COLONOSCOPY WITH PROPOFOL N/A 09/23/2016   [place]: 9 polyps removed, multiple tubular adenomas. recommended 3 yr surveillance  . COLONOSCOPY WITH PROPOFOL N/A 12/27/2019   ten sessile polyps, 4-9 mm in size in descending, hepatic flexure, and cecum, 11 mm polyp in sigmoid, three sessile polyps in rectum and mid rectum 2-3 mm in size. Tubular adenomas and hyperplastic. 3 year surveillance  . CORONARY/GRAFT ACUTE MI REVASCULARIZATION N/A 04/07/2018   Procedure: Coronary/Graft Acute MI Revascularization;  Surgeon: Burnell Blanks, MD;  Location: [place] [facility];  Service: Cardiovascular;  Laterality: N/A;  . LEFT HEART CATH AND CORONARY ANGIOGRAPHY N/A 04/07/2018   Procedure: LEFT HEART CATH AND CORONARY ANGIOGRAPHY;  Surgeon: Burnell Blanks, MD;  Location: [place] [facility];  Service: Cardiovascular;  Laterality: N/A;  . MELANOMA EXCISION  07/2019  . POLYPECTOMY  09/23/2016   Procedure: POLYPECTOMY;  Surgeon: Daneil Dolin, MD;  [facility];  Service: Endoscopy;;  colon  . POLYPECTOMY  12/27/2019   Procedure: POLYPECTOMY;  Surgeon: Daneil Dolin, MD;  [facility];  Service: Endoscopy;;  . RETINAL DETACHMENT SURGERY      Current Outpatient Medications  Medication Sig Dispense Refill  . ALPRAZolam (XANAX) 0.5 MG tablet Take 0.5 mg by mouth 2 (two) times daily as needed for anxiety.    Marland Kitchen apixaban (ELIQUIS) 5 MG TABS tablet Take 1 tablet (5 mg total) by mouth 2 (two) times daily. 60 tablet 6  . atorvastatin (LIPITOR) 80 MG tablet Take 1 tablet (80 mg total) by mouth daily. 90 tablet 3  . B Complex-C (SUPER B COMPLEX PO) Take 1 tablet by mouth daily.     . carvedilol (COREG) 12.5 MG tablet Take 1 tablet (12.5 mg total) by mouth 2 (two) times daily. 180 tablet 1  . DULoxetine  (CYMBALTA) 60 MG capsule Take 60 mg by mouth daily with lunch.     . ergocalciferol (VITAMIN D2) 50000 units capsule Take 50,000 Units by mouth every Sunday.     . gabapentin (NEURONTIN) 300 MG capsule Take 300 mg by mouth 2 (two) times daily.     Marland Kitchen glucose blood (ONETOUCH ULTRA) test strip USE ONE TEST STRIP AS DIRECTED TWICE DAILY 100 each 0  . HYDROcodone-acetaminophen (NORCO/VICODIN) 5-325 MG per tablet Take 1 tablet by mouth every 6 (six) hours as needed for moderate pain.    . Insulin Glargine (BASAGLAR KWIKPEN) 100 UNIT/ML Inject 35 Units into the skin at bedtime. 21 mL 3  . lisinopril (ZESTRIL) 40 MG tablet Take 40 mg by mouth daily.    . nitroGLYCERIN (NITROSTAT) 0.4 MG SL tablet Place 1 tablet (0.4 mg total) under the tongue every 5 (five) minutes as needed. (Patient taking differently: Place 0.4 mg under the tongue every 5 (five) minutes as needed for chest pain.) 25 tablet 2  . rOPINIRole (REQUIP) 1 MG tablet Take 1 mg by mouth 4 (four) times daily.     . timolol (TIMOPTIC) 0.5 % ophthalmic solution INSTILL 1 DROP INTO EACH EYE TWICE DAILY (Patient taking differently: Place 1 drop into both eyes 2 (two) times daily.) 10 mL 10  . UNIFINE PENTIPS 31G X 5 MM MISC USE TO INJECT INSULIN AT BEDTIME (Patient taking differently: at bedtime.) 100 each 5  . linaclotide (LINZESS) 145 MCG CAPS capsule Take 1 capsule (145 mcg total) by mouth daily before breakfast. (Patient not taking: Reported on 03/02/2020) 30 capsule 3   No current facility-administered medications for this visit.    Allergies as of 03/02/2020  . (No Known Allergies)    Family History  Problem Relation Age of Onset  . Hypertension Father   . Colon cancer Neg Hx     Social History   Socioeconomic History  . Marital status: Married    Spouse name: Not on file  . Number of children: Not on file  . Years of education: Not on file  . Highest education level: Not on file  Occupational History  . Not on file  Tobacco  Use  . Smoking status: Former Smoker    Packs/day: 1.50    Years: 30.00    Pack years: 45.00    Types: Cigarettes    Quit date: 07/05/2012    Years since quitting: 7.6  . Smokeless tobacco: Never Used  Vaping Use  . Vaping Use: Never used  Substance and Sexual Activity  . Alcohol use: No  . Drug use: No  . Sexual activity: Not Currently    Birth control/protection: None  Other Topics Concern  . Not on file  Social History Narrative  . Not on file   Social Determinants of Health   Financial Resource Strain: Not  on file  Food Insecurity: Not on file  Transportation Needs: Not on file  Physical Activity: Not on file  Stress: Not on file  Social Connections: Not on file    Review of Systems: Gen: Denies fever, chills, anorexia. Denies fatigue, weakness, weight loss.  CV: Denies chest pain, palpitations, syncope, peripheral edema, and claudication. Resp: Denies dyspnea at rest, cough, wheezing, coughing up blood, and pleurisy. GI: see HPI Derm: Denies rash, itching, dry skin Psych: Denies depression, anxiety, memory loss, confusion. No homicidal or suicidal ideation.  Heme: Denies bruising, bleeding, and enlarged lymph nodes.  Physical Exam: BP (!) 184/75   Pulse 60   Temp (!) 97.1 F (36.2 C)   Ht 5\' 10"  (1.778 m)   Wt 221 lb 9.6 oz (100.5 kg)   BMI 31.80 kg/m  General:   Alert and oriented. No distress noted. Pleasant and cooperative.  Head:  Normocephalic and atraumatic. Eyes:  Conjuctiva clear without scleral icterus. Mouth:  Mask in place Abdomen:  +BS, soft, non-tender and non-distended. No rebound or guarding. No HSM or masses noted. Msk:  Symmetrical without gross deformities. Normal posture. Extremities:  Without edema. Neurologic:  Alert and  oriented x4 Psych:  Alert and cooperative. Normal mood and affect.  ASSESSMENT: Xavier Caldwell is a 70 y.o. male presenting today with history of constipation, adenomas with surveillance colonoscopy due in 2024, in  follow-up after colonoscopy.  Now noting incomplete and frequent BMs, not ideally productive. Linzess 145 mcg works but overshooting the mark somewhat and unable to take during work.   Will trial adding Benefiber daily, and I have asked him to trial the lower dose of Linzess 72 mcg daily. Samples provided.    PLAN:  Start Benefiber. Handout provided.  Linzess 72 mcg daily provided  Return in 6 months or sooner if needed   Annitta Needs, PhD, [facility]

## 2020-03-02 NOTE — Patient Instructions (Signed)
You can start taking Benefiber 2 teaspoons each morning. I have included a handout for you.  If you would like to try the lowest dosage of Linzess on your days off, I have provided samples. Let me know if you need a prescription sent in for this!  We will see you in 6 months or sooner if needed!  It was a pleasure to see you today. I want to create trusting relationships with patients to provide genuine, compassionate, and quality care. I value your feedback. If you receive a survey regarding your visit,  I greatly appreciate you taking time to fill this out.   Annitta Needs, PhD, ANP-BC [facility]

## 2020-03-02 NOTE — Telephone Encounter (Signed)
Wife Shelia notified and voiced understanding.

## 2020-03-02 NOTE — Telephone Encounter (Signed)
New message     Pt c/o BP issue: STAT if pt c/o blurred vision, one-sided weakness or slurred speech  1. What are your last 5 BP readings? 200/100 at 5a this morning and then it was 139/69 this morning 177/80 last night at 11p   184/75 at 11am   2. Are you having any other symptoms (ex. Dizziness, headache, blurred vision, passed out)? Dizziness   3. What is your BP issue?  Patient bp is running high

## 2020-03-06 NOTE — Patient Instructions (Signed)

## 2020-03-07 ENCOUNTER — Other Ambulatory Visit: Payer: Self-pay

## 2020-03-07 ENCOUNTER — Ambulatory Visit (INDEPENDENT_AMBULATORY_CARE_PROVIDER_SITE_OTHER): Payer: Medicare HMO | Admitting: Nurse Practitioner

## 2020-03-07 ENCOUNTER — Encounter: Payer: Self-pay | Admitting: Nurse Practitioner

## 2020-03-07 VITALS — BP 191/83 | HR 57 | Ht 70.0 in | Wt 220.8 lb

## 2020-03-07 DIAGNOSIS — E782 Mixed hyperlipidemia: Secondary | ICD-10-CM | POA: Diagnosis not present

## 2020-03-07 DIAGNOSIS — L988 Other specified disorders of the skin and subcutaneous tissue: Secondary | ICD-10-CM | POA: Diagnosis not present

## 2020-03-07 DIAGNOSIS — E1165 Type 2 diabetes mellitus with hyperglycemia: Secondary | ICD-10-CM

## 2020-03-07 DIAGNOSIS — E118 Type 2 diabetes mellitus with unspecified complications: Secondary | ICD-10-CM

## 2020-03-07 DIAGNOSIS — Z1283 Encounter for screening for malignant neoplasm of skin: Secondary | ICD-10-CM | POA: Diagnosis not present

## 2020-03-07 DIAGNOSIS — D485 Neoplasm of uncertain behavior of skin: Secondary | ICD-10-CM | POA: Diagnosis not present

## 2020-03-07 DIAGNOSIS — IMO0002 Reserved for concepts with insufficient information to code with codable children: Secondary | ICD-10-CM

## 2020-03-07 DIAGNOSIS — I10 Essential (primary) hypertension: Secondary | ICD-10-CM | POA: Diagnosis not present

## 2020-03-07 DIAGNOSIS — Z794 Long term (current) use of insulin: Secondary | ICD-10-CM | POA: Diagnosis not present

## 2020-03-07 MED ORDER — BASAGLAR KWIKPEN 100 UNIT/ML ~~LOC~~ SOPN
35.0000 [IU] | PEN_INJECTOR | Freq: Every day | SUBCUTANEOUS | 3 refills | Status: DC
Start: 2020-03-07 — End: 2020-03-10

## 2020-03-07 NOTE — Progress Notes (Signed)
03/07/2020    Endocrinology follow-up note   Subjective:    Patient ID: Xavier Caldwell, male    DOB: [birth year],    Past Medical History:  Diagnosis Date  . Acute systolic (congestive) heart failure ([place])   . Anxiety   . Arthritis   . Atrial fibrillation ([place])   . Back pain   . Coronary artery disease   . Diabetes mellitus without complication ([place])   . High cholesterol   . History of gout   . Hypertension   . Myocardial infarction ([place]) 04/2018  . Neuropathy   . PAD (peripheral artery disease) ([place])   . 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 Sleep apnea    Stop Bang score of 4  . STEMI (ST elevation myocardial infarction) ([place])    04/08/2018 PCI/DES RCA  . VT (ventricular tachycardia) ([place])    Past Surgical History:  Procedure Laterality Date  . APPENDECTOMY    . CARDIOVERSION N/A 04/13/2018   Procedure: CARDIOVERSION;  Surgeon: Skeet Latch, MD;  Location: [facility];  Service: Cardiovascular;  Laterality: N/A;  . CATARACT EXTRACTION W/PHACO Right 04/25/2014   Procedure: CATARACT EXTRACTION PHACO AND INTRAOCULAR LENS PLACEMENT ([place]);  Surgeon: Williams Che, MD;  Location: [facility];  Service: Ophthalmology;  Laterality: Right;  CDE:4.70  . CATARACT EXTRACTION W/PHACO Left 07/11/2014   Procedure: CATARACT EXTRACTION PHACO AND INTRAOCULAR LENS PLACEMENT LEFT EYE CDE=8.32;  Surgeon: Williams Che, MD;  Location: [facility];  Service: Ophthalmology;  Laterality: Left;  . COLONOSCOPY WITH PROPOFOL N/A 09/23/2016   [place]: 9 polyps removed, multiple tubular adenomas. recommended 3 yr surveillance  . COLONOSCOPY WITH PROPOFOL N/A 12/27/2019   ten sessile polyps, 4-9 mm in size in descending, hepatic flexure, and cecum, 11 mm polyp in sigmoid, three sessile polyps in rectum and mid rectum 2-3 mm in size. Tubular adenomas and hyperplastic. 3 year surveillance  . CORONARY/GRAFT ACUTE MI REVASCULARIZATION N/A 04/07/2018   Procedure: Coronary/Graft Acute MI  Revascularization;  Surgeon: Burnell Blanks, MD;  Location: [facility];  Service: Cardiovascular;  Laterality: N/A;  . LEFT HEART CATH AND CORONARY ANGIOGRAPHY N/A 04/07/2018   Procedure: LEFT HEART CATH AND CORONARY ANGIOGRAPHY;  Surgeon: Burnell Blanks, MD;  Location: [facility];  Service: Cardiovascular;  Laterality: N/A;  . MELANOMA EXCISION  07/2019  . POLYPECTOMY  09/23/2016   Procedure: POLYPECTOMY;  Surgeon: Daneil Dolin, MD;  [facility];  Service: Endoscopy;;  colon  . POLYPECTOMY  12/27/2019   Procedure: POLYPECTOMY;  Surgeon: Daneil Dolin, MD;  [facility];  Service: Endoscopy;;  . RETINAL DETACHMENT SURGERY     Social History   Socioeconomic History  . Marital status: Married    Spouse name: Not on file  . Number of children: Not on file  . Years of education: Not on file  . Highest education level: Not on file  Occupational History  . Not on file  Tobacco Use  . Smoking status: Former Smoker    Packs/day: 1.50    Years: 30.00    Pack years: 45.00    Types: Cigarettes    Quit date: 07/05/2012    Years since quitting: 7.6  . Smokeless tobacco: Never Used  Vaping Use  . Vaping Use: Never used  Substance and Sexual Activity  . Alcohol use: No  . Drug use: No  . Sexual activity: Not Currently  Birth control/protection: None  Other Topics Concern  . Not on file  Social History Narrative  . Not on file   Social Determinants of Health   Financial Resource Strain: Not on file  Food Insecurity: Not on file  Transportation Needs: Not on file  Physical Activity: Not on file  Stress: Not on file  Social Connections: Not on file   Outpatient Encounter Medications as of 03/07/2020  Medication Sig  . ALPRAZolam (XANAX) 0.5 MG tablet Take 0.5 mg by mouth 2 (two) times daily as needed for anxiety.  Marland Kitchen apixaban (ELIQUIS) 5 MG TABS tablet Take 1 tablet (5 mg total) by mouth 2 (two) times daily.  Marland Kitchen atorvastatin  (LIPITOR) 80 MG tablet Take 1 tablet (80 mg total) by mouth daily.  . B Complex-C (SUPER B COMPLEX PO) Take 1 tablet by mouth daily.   . carvedilol (COREG) 12.5 MG tablet Take 1 tablet (12.5 mg total) by mouth 2 (two) times daily.  . DULoxetine (CYMBALTA) 60 MG capsule Take 60 mg by mouth daily with lunch.   . ergocalciferol (VITAMIN D2) 50000 units capsule Take 50,000 Units by mouth every Sunday.   . gabapentin (NEURONTIN) 300 MG capsule Take 300 mg by mouth 2 (two) times daily.   Marland Kitchen glucose blood (ONETOUCH ULTRA) test strip USE ONE TEST STRIP AS DIRECTED TWICE DAILY  . HYDROcodone-acetaminophen (NORCO/VICODIN) 5-325 MG per tablet Take 1 tablet by mouth every 6 (six) hours as needed for moderate pain.  Marland Kitchen linaclotide (LINZESS) 145 MCG CAPS capsule Take 1 capsule (145 mcg total) by mouth daily before breakfast.  . lisinopril (ZESTRIL) 40 MG tablet Take 40 mg by mouth daily.  . naproxen (NAPROSYN) 375 MG tablet Take 375 mg by mouth 2 (two) times daily.  . nitroGLYCERIN (NITROSTAT) 0.4 MG SL tablet Place 1 tablet (0.4 mg total) under the tongue every 5 (five) minutes as needed. (Patient taking differently: Place 0.4 mg under the tongue every 5 (five) minutes as needed for chest pain.)  . rOPINIRole (REQUIP) 1 MG tablet Take 1 mg by mouth 4 (four) times daily.   . timolol (TIMOPTIC) 0.5 % ophthalmic solution INSTILL 1 DROP INTO EACH EYE TWICE DAILY (Patient taking differently: Place 1 drop into both eyes 2 (two) times daily.)  . UNIFINE PENTIPS 31G X 5 MM MISC USE TO INJECT INSULIN AT BEDTIME (Patient taking differently: at bedtime.)  . [DISCONTINUED] Insulin Glargine (BASAGLAR KWIKPEN) 100 UNIT/ML Inject 35 Units into the skin at bedtime.  . Insulin Glargine (BASAGLAR KWIKPEN) 100 UNIT/ML Inject 35 Units into the skin at bedtime.   No facility-administered encounter medications on file as of 03/07/2020.   ALLERGIES: No Known Allergies VACCINATION STATUS: Immunization History  Administered Date(s)  Administered  . Influenza, High Dose Seasonal PF 10/21/2017    Diabetes He presents for his follow-up (Telephone visit) diabetic visit. He has type 2 diabetes mellitus. His disease course has been stable. Hypoglycemia symptoms include nervousness/anxiousness, sweats and tremors. Pertinent negatives for hypoglycemia include no confusion, headaches, pallor or seizures. Pertinent negatives for diabetes include no chest pain, no fatigue, no polydipsia, no polyphagia, no polyuria and no weakness. There are no hypoglycemic complications. Symptoms are stable. Diabetic complications include heart disease and nephropathy. Risk factors for coronary artery disease include diabetes mellitus, hypertension, tobacco exposure, male sex, obesity, sedentary lifestyle and dyslipidemia. Current diabetic treatment includes insulin injections. He is compliant with treatment most of the time. His weight is fluctuating minimally. He is following a generally healthy diet. Meal planning  includes avoidance of concentrated sweets. He has had a previous visit with a dietitian. He participates in exercise three times a week. His home blood glucose trend is decreasing steadily. His breakfast blood glucose range is generally 70-90 mg/dl. His bedtime blood glucose range is generally 140-180 mg/dl. (He presents today with his logs, no meter, showing improved glycemic profile overall.  His previsit A1c was 6.9% on 02/29/20 at his PCP office, increasing slightly from previous visit of 6.5%.  He recently started back working and walks approximately 7 miles per week.  I stopped his Metformin since last visit due to drastic change in renal function from previous year.  There are no significant hypoglycemia events noted.) An ACE inhibitor/angiotensin II receptor blocker is being taken. He sees a podiatrist.Eye exam is current.  Hyperlipidemia This is a chronic problem. The current episode started more than 1 year ago. The problem is controlled.  Recent lipid tests were reviewed and are normal. Exacerbating diseases include chronic renal disease, diabetes and obesity. Pertinent negatives include no chest pain, myalgias or shortness of breath. Current antihyperlipidemic treatment includes statins. The current treatment provides mild improvement of lipids. Compliance problems include adherence to exercise.  Risk factors for coronary artery disease include dyslipidemia, diabetes mellitus, hypertension, male sex, a sedentary lifestyle and obesity.  Hypertension This is a chronic problem. The current episode started more than 1 year ago. The problem has been waxing and waning since onset. The problem is uncontrolled. Associated symptoms include sweats. Pertinent negatives include no chest pain, headaches, neck pain, palpitations or shortness of breath. There are no associated agents to hypertension. Risk factors for coronary artery disease include diabetes mellitus, dyslipidemia, male gender, sedentary lifestyle, smoking/tobacco exposure and obesity. Past treatments include beta blockers and ACE inhibitors. The current treatment provides mild improvement. There are no compliance problems.  Hypertensive end-organ damage includes kidney disease and CAD/MI. Identifiable causes of hypertension include chronic renal disease.    Review of systems  Constitutional: + Minimally fluctuating body weight,  current Body mass index is 31.68 kg/m. , no fatigue, no subjective hyperthermia, no subjective hypothermia Eyes: no blurry vision, no xerophthalmia ENT: no sore throat, no nodules palpated in throat, no dysphagia/odynophagia, no hoarseness Cardiovascular: no chest pain, no shortness of breath, no palpitations, no leg swelling Respiratory: no cough, no shortness of breath Gastrointestinal: no nausea/vomiting/diarrhea Musculoskeletal: no muscle/joint aches, no longer walks with cane as his mobility has improved Skin: no rashes, no hyperemia, history of several  lesions showing melanoma Neurological: no tremors, no numbness, no tingling, no dizziness Psychiatric: no depression, no anxiety    Objective:    BP (!) 191/83 (BP Location: Left Arm, Patient Position: Sitting)   Pulse (!) 57   Ht 5\' 10"  (1.778 m)   Wt 220 lb 12.8 oz (100.2 kg)   BMI 31.68 kg/m   Wt Readings from Last 3 Encounters:  03/07/20 220 lb 12.8 oz (100.2 kg)  03/02/20 221 lb 9.6 oz (100.5 kg)  01/04/20 211 lb (95.7 kg)    BP Readings from Last 3 Encounters:  03/07/20 (!) 191/83  03/02/20 (!) 184/75  01/04/20 (!) 166/100    Physical Exam- Limited  Constitutional:  Body mass index is 31.68 kg/m. , not in acute distress, normal state of mind Eyes:  EOMI, no exophthalmos Neck: Supple Thyroid: No gross goiter Cardiovascular: RRR, no murmers, rubs, or gallops, no edema Respiratory: Adequate breathing efforts, no crackles, rales, rhonchi, or wheezing Musculoskeletal: no gross deformities, no gross restriction of joint movements  Skin:  no rashes, no hyperemia Neurological: no tremor with outstretched hands   Results for orders placed or performed during the hospital encounter of 12/27/19  Glucose, capillary  Result Value Ref Range   Glucose-Capillary 123 (H) 70 - 99 mg/dL  Surgical pathology  Result Value Ref Range   SURGICAL PATHOLOGY      SURGICAL PATHOLOGY CASE: [id number] PATIENT: Xavier Caldwell Surgical Pathology Report     Clinical History: h/o colon polyps     FINAL MICROSCOPIC DIAGNOSIS:  A. COLON, HEPATIC FLEXURE, POLYPECTOMY: - Tubular adenoma. - No high grade dysplasia or malignancy.  B. COLON, CECUM, POLYPECTOMY: - Tubular adenoma. - No high grade dysplasia or malignancy.  C. COLON, DESCENDING, POLYPECTOMY: - Tubular adenoma. - Hyperplastic polyp. - No high grade dysplasia or malignancy.  D. COLON, SIGMOID, POLYPECTOMY: - Hyperplastic polyp. - No dysplasia or malignancy.  E. RECTUM, POLYPECTOMY: - Hyperplastic polyp. - No  dysplasia or malignancy.   GROSS DESCRIPTION:  A: Received in formalin are tan, soft tissue fragments that are submitted in toto. Number: 3 size: Range from 0.5 cm to 1.5 x 0.3 x 0.2 cm blocks: 1  B: Received in formalin are tan, soft tissue fragments that are submitted in toto. Number: 2 size: 0.5 and 1.3 x 0.3 x 0.3 cm blocks: 1  C: The specimen  is received in formalin and consists of 4 polypoid pieces of tan-red soft tissue, ranging from 0.2 cm to 0.7 x 0.5 x 0.4 cm.  Possible resection margin of the largest polyp is inked black, and the polyp is bisected.  Specimen is entirely submitted in 2 cassettes.  D: The specimen is received in formalin and consists of a 0.7 x 0.6 x 0.5 cm polypoid piece of tan-brown soft tissue.  A possible resection margin is inked black, the specimen is bisected and entirely submitted in 1 cassette.  E: Received in formalin are tan, soft tissue fragments that are submitted in toto. Number: 3 size: Range from 0.1 x 0.3 cm blocks: 1 Xavier Caldwell 12/27/2019)    Final Diagnosis performed by Vicente Males, MD.   Electronically signed 12/28/2019 Technical component performed at [facility], [place] [address] [place], [place] [address].  Professional component performed at [facility], [place] [address] [place], [place] [address].  Immunohistochemistry Techni cal component (if applicable) was performed at [facility]. [address], [place], [place], [place] [address].   IMMUNOHISTOCHEMISTRY DISCLAIMER (if applicable): Some of these immunohistochemical stains may have been developed and the performance characteristics determine by [facility]. Some may not have been cleared or approved by the U.S. Food and Drug Administration. The FDA has determined that such clearance or approval is not necessary. This test is used for clinical purposes. It should not be regarded as investigational or for  research. This laboratory is certified under the [place] (CLIA-88) as qualified to perform high complexity clinical laboratory testing.  The controls stained appropriately.    Diabetic Labs (most recent): Lab Results  Component Value Date   HGBA1C 6.5 (A) 11/05/2019   HGBA1C 6.6 (A) 07/06/2019   HGBA1C 7.5 (A) 03/04/2019   Lipid Panel     Component Value Date/Time   CHOL 125 02/29/2020 1022   TRIG 85 02/29/2020 1022   HDL 38 (L) 02/29/2020 1022   CHOLHDL 3.3 02/29/2020 1022   CHOLHDL 4.4 04/07/2018 1349   VLDL 18 04/07/2018 1349   LDLCALC 70 02/29/2020 1022  Assessment & Plan:   1) Uncontrolled type 2 diabetes mellitus complicated by coronary artery disease.    He presents today with his logs, no meter, showing improved glycemic profile overall.  His previsit A1c was 6.9% on 02/29/20 at his PCP office, increasing slightly from previous visit of 6.5%.  He recently started back working and walks approximately 7 miles per week.  I stopped his Metformin since last visit due to drastic change in renal function from previous year.  There are no significant hypoglycemia events noted.  -He remains at extremely  high risk for more  acute and chronic complications of diabetes which include CAD, CVA, CKD, retinopathy, and neuropathy. These are all discussed in detail with the patient.  - Nutritional counseling repeated at each appointment due to patients tendency to fall back in to old habits.  - The patient admits there is a room for improvement in their diet and drink choices. -  Suggestion is made for the patient to avoid simple carbohydrates from their diet including Cakes, Sweet Desserts / Pastries, Ice Cream, Soda (diet and regular), Sweet Tea, Candies, Chips, Cookies, Sweet Pastries,  Store Bought Juices, Alcohol in Excess of  1-2 drinks a day, Artificial Sweeteners, Coffee Creamer, and "Sugar-free" Products. This will help patient to have  stable blood glucose profile and potentially avoid unintended weight gain.   - I encouraged the patient to switch to  unprocessed or minimally processed complex starch and increased protein intake (animal or plant source), fruits, and vegetables.   - Patient is advised to stick to a routine mealtimes to eat 3 meals  a day and avoid unnecessary snacks ( to snack only to correct hypoglycemia).  -Given his stable glycemic profile, he is advised to continue Basaglar 35 units SQ daily at bedtime.  Due to significant decline in renal function, he is advised to avoid Metformin for now.  -He is encouraged to continue monitoring blood glucose twice daily, before breakfast and before bed, and to call the clinic if he has readings less than 70 or above 200 for 3 tests in a row.  2) BP/HTN:  His blood pressure is not controlled to target.  He had not long taken his medication prior to todays appointment.  He says his BP is always elevated at doctors appointments.  He reports he recently had a discussion with his cardiologist about his BP and was recommended to stay on his current regimen. He is advised to continue Coreg 6.25 mg po twice daily and Lisinopril 40 mg po daily.  He does monitor BP at home and notes that it is typically slightly elevated around 150s/90s.  3) Lipids/HPL:  His most recent lipid panel from 02/29/20 shows controlled LDL of 70.  He is advised to continue Lipitor 80 mg po daily at bedtime.  Side effects and precautions discussed with him.  4) Chronic Care/Health Maintenance: -He is on ACE and statin medication and is encouraged to initiate and continue to follow up with Ophthalmology, Dentist,  Podiatrist at least yearly or according to recommendations, and advised to stay away from smoking. I have recommended yearly flu vaccine and pneumonia vaccine at least every 5 years; moderate intensity exercise for up to 150 minutes weekly; and  sleep for at least 7 hours a day.   He is advised to  maintain close follow-up with his PMD, Dr. Gerarda Fraction.  - Time spent on this patient care encounter:  35 min, of which > 50% was spent in  counseling and  the rest reviewing his blood glucose logs , discussing his hypoglycemia and hyperglycemia episodes, reviewing his current and  previous labs / studies  ( including abstraction from other facilities) and medications  doses and developing a  long term treatment plan and documenting his care.   Please refer to Patient Instructions for Blood Glucose Monitoring and Insulin/Medications Dosing Guide"  in media tab for additional information. Please  also refer to " Patient Self Inventory" in the Media  tab for reviewed elements of pertinent patient history.  Xavier Caldwell participated in the discussions, expressed understanding, and voiced agreement with the above plans.  All questions were answered to his satisfaction. he is encouraged to contact clinic should he have any questions or concerns prior to his return visit.   Follow up plan: Return in about 3 months (around 06/04/2020) for Diabetes follow up with A1c in office, Previsit labs, Bring glucometer and logs.  Rayetta Pigg, [facility] [address] [place], [place] [address] Phone: [phone] Fax: [phone]  03/07/2020, 9:55 AM

## 2020-03-09 ENCOUNTER — Encounter (HOSPITAL_COMMUNITY): Payer: Self-pay | Admitting: *Deleted

## 2020-03-09 ENCOUNTER — Emergency Department (HOSPITAL_COMMUNITY): Payer: Medicare HMO

## 2020-03-09 ENCOUNTER — Other Ambulatory Visit: Payer: Self-pay

## 2020-03-09 ENCOUNTER — Observation Stay (HOSPITAL_COMMUNITY)
Admission: EM | Admit: 2020-03-09 | Discharge: 2020-03-10 | Disposition: A | Discharge status: Still In Hospital | Payer: Medicare HMO | Attending: Internal Medicine | Admitting: Internal Medicine

## 2020-03-09 DIAGNOSIS — I6523 Occlusion and stenosis of bilateral carotid arteries: Secondary | ICD-10-CM | POA: Diagnosis not present

## 2020-03-09 DIAGNOSIS — Z7901 Long term (current) use of anticoagulants: Secondary | ICD-10-CM | POA: Insufficient documentation

## 2020-03-09 DIAGNOSIS — Z20822 Contact with and (suspected) exposure to covid-19: Secondary | ICD-10-CM | POA: Diagnosis not present

## 2020-03-09 DIAGNOSIS — I2510 Atherosclerotic heart disease of native coronary artery without angina pectoris: Secondary | ICD-10-CM | POA: Insufficient documentation

## 2020-03-09 DIAGNOSIS — N1832 Chronic kidney disease, stage 3b: Secondary | ICD-10-CM | POA: Diagnosis not present

## 2020-03-09 DIAGNOSIS — I4891 Unspecified atrial fibrillation: Secondary | ICD-10-CM | POA: Diagnosis not present

## 2020-03-09 DIAGNOSIS — I651 Occlusion and stenosis of basilar artery: Secondary | ICD-10-CM | POA: Diagnosis not present

## 2020-03-09 DIAGNOSIS — M47812 Spondylosis without myelopathy or radiculopathy, cervical region: Secondary | ICD-10-CM | POA: Diagnosis not present

## 2020-03-09 DIAGNOSIS — E1165 Type 2 diabetes mellitus with hyperglycemia: Secondary | ICD-10-CM

## 2020-03-09 DIAGNOSIS — I130 Hypertensive heart and chronic kidney disease with heart failure and stage 1 through stage 4 chronic kidney disease, or unspecified chronic kidney disease: Secondary | ICD-10-CM | POA: Insufficient documentation

## 2020-03-09 DIAGNOSIS — I739 Peripheral vascular disease, unspecified: Secondary | ICD-10-CM | POA: Insufficient documentation

## 2020-03-09 DIAGNOSIS — M4802 Spinal stenosis, cervical region: Secondary | ICD-10-CM | POA: Diagnosis not present

## 2020-03-09 DIAGNOSIS — R531 Weakness: Secondary | ICD-10-CM | POA: Diagnosis not present

## 2020-03-09 DIAGNOSIS — E1169 Type 2 diabetes mellitus with other specified complication: Secondary | ICD-10-CM | POA: Diagnosis present

## 2020-03-09 DIAGNOSIS — R9431 Abnormal electrocardiogram [ECG] [EKG]: Secondary | ICD-10-CM | POA: Diagnosis not present

## 2020-03-09 DIAGNOSIS — E875 Hyperkalemia: Secondary | ICD-10-CM | POA: Diagnosis not present

## 2020-03-09 DIAGNOSIS — E785 Hyperlipidemia, unspecified: Secondary | ICD-10-CM | POA: Insufficient documentation

## 2020-03-09 DIAGNOSIS — Y9301 Activity, walking, marching and hiking: Secondary | ICD-10-CM | POA: Insufficient documentation

## 2020-03-09 DIAGNOSIS — R29898 Other symptoms and signs involving the musculoskeletal system: Secondary | ICD-10-CM

## 2020-03-09 DIAGNOSIS — Z87891 Personal history of nicotine dependence: Secondary | ICD-10-CM | POA: Insufficient documentation

## 2020-03-09 DIAGNOSIS — Z794 Long term (current) use of insulin: Secondary | ICD-10-CM | POA: Diagnosis not present

## 2020-03-09 DIAGNOSIS — R0602 Shortness of breath: Secondary | ICD-10-CM | POA: Insufficient documentation

## 2020-03-09 DIAGNOSIS — R29818 Other symptoms and signs involving the nervous system: Secondary | ICD-10-CM

## 2020-03-09 DIAGNOSIS — E1122 Type 2 diabetes mellitus with diabetic chronic kidney disease: Secondary | ICD-10-CM | POA: Insufficient documentation

## 2020-03-09 DIAGNOSIS — I10 Essential (primary) hypertension: Secondary | ICD-10-CM | POA: Diagnosis not present

## 2020-03-09 DIAGNOSIS — I480 Paroxysmal atrial fibrillation: Secondary | ICD-10-CM | POA: Insufficient documentation

## 2020-03-09 DIAGNOSIS — M6281 Muscle weakness (generalized): Principal | ICD-10-CM | POA: Insufficient documentation

## 2020-03-09 DIAGNOSIS — Z79899 Other long term (current) drug therapy: Secondary | ICD-10-CM | POA: Insufficient documentation

## 2020-03-09 DIAGNOSIS — Z9861 Coronary angioplasty status: Secondary | ICD-10-CM | POA: Insufficient documentation

## 2020-03-09 DIAGNOSIS — I5021 Acute systolic (congestive) heart failure: Secondary | ICD-10-CM | POA: Insufficient documentation

## 2020-03-09 DIAGNOSIS — S0990XA Unspecified injury of head, initial encounter: Secondary | ICD-10-CM | POA: Diagnosis not present

## 2020-03-09 DIAGNOSIS — M50222 Other cervical disc displacement at C5-C6 level: Secondary | ICD-10-CM | POA: Diagnosis not present

## 2020-03-09 DIAGNOSIS — W01198A Fall on same level from slipping, tripping and stumbling with subsequent striking against other object, initial encounter: Secondary | ICD-10-CM | POA: Diagnosis not present

## 2020-03-09 DIAGNOSIS — M50221 Other cervical disc displacement at C4-C5 level: Secondary | ICD-10-CM | POA: Diagnosis not present

## 2020-03-09 LAB — DIFFERENTIAL
Abs Immature Granulocytes: 0.06 10*3/uL (ref 0.00–0.07)
Basophils Absolute: 0.1 10*3/uL (ref 0.0–0.1)
Basophils Relative: 1 %
Eosinophils Absolute: 0.2 10*3/uL (ref 0.0–0.5)
Eosinophils Relative: 2 %
Immature Granulocytes: 1 %
Lymphocytes Relative: 12 %
Lymphs Abs: 1.4 10*3/uL (ref 0.7–4.0)
Monocytes Absolute: 0.6 10*3/uL (ref 0.1–1.0)
Monocytes Relative: 5 %
Neutro Abs: 8.8 10*3/uL — ABNORMAL HIGH (ref 1.7–7.7)
Neutrophils Relative %: 79 %

## 2020-03-09 LAB — URINALYSIS, ROUTINE W REFLEX MICROSCOPIC
Bilirubin Urine: NEGATIVE
Glucose, UA: 50 mg/dL — AB
Hgb urine dipstick: NEGATIVE
Ketones, ur: NEGATIVE mg/dL
Leukocytes,Ua: NEGATIVE
Nitrite: NEGATIVE
Protein, ur: NEGATIVE mg/dL
Specific Gravity, Urine: 1.015 (ref 1.005–1.030)
pH: 5 (ref 5.0–8.0)

## 2020-03-09 LAB — CBG MONITORING, ED
Glucose-Capillary: 201 mg/dL — ABNORMAL HIGH (ref 70–99)
Glucose-Capillary: 162 mg/dL — ABNORMAL HIGH (ref 70–99)

## 2020-03-09 LAB — BASIC METABOLIC PANEL
Anion gap: <3 — ABNORMAL LOW (ref 5–15)
Anion gap: 6 (ref 5–15)
BUN: 34 mg/dL — ABNORMAL HIGH (ref 8–23)
BUN: 33 mg/dL — ABNORMAL HIGH (ref 8–23)
CO2: 20 mmol/L — ABNORMAL LOW (ref 22–32)
CO2: 20 mmol/L — ABNORMAL LOW (ref 22–32)
Calcium: 8.4 mg/dL — ABNORMAL LOW (ref 8.9–10.3)
Calcium: 9.1 mg/dL (ref 8.9–10.3)
Chloride: 111 mmol/L (ref 98–111)
Chloride: 111 mmol/L (ref 98–111)
Creatinine, Ser: 2.07 mg/dL — ABNORMAL HIGH (ref 0.61–1.24)
Creatinine, Ser: 2.02 mg/dL — ABNORMAL HIGH (ref 0.61–1.24)
GFR, Estimated: 34 mL/min — ABNORMAL LOW (ref >60)
GFR, Estimated: 35 mL/min — ABNORMAL LOW (ref >60)
Glucose, Bld: 627 mg/dL (ref 70–99)
Glucose, Bld: 73 mg/dL (ref 70–99)
Potassium: 7.5 mmol/L (ref 3.5–5.1)
Potassium: 6.1 mmol/L — ABNORMAL HIGH (ref 3.5–5.1)
Sodium: 132 mmol/L — ABNORMAL LOW (ref 135–145)
Sodium: 137 mmol/L (ref 135–145)

## 2020-03-09 LAB — CBC
HCT: 32.7 % — ABNORMAL LOW (ref 39.0–52.0)
Hemoglobin: 10.4 g/dL — ABNORMAL LOW (ref 13.0–17.0)
MCH: 29.6 pg (ref 26.0–34.0)
MCHC: 31.8 g/dL (ref 30.0–36.0)
MCV: 93.2 fL (ref 80.0–100.0)
Platelets: 273 10*3/uL (ref 150–400)
RBC: 3.51 MIL/uL — ABNORMAL LOW (ref 4.22–5.81)
RDW: 13.3 % (ref 11.5–15.5)
WBC: 11 10*3/uL — ABNORMAL HIGH (ref 4.0–10.5)
nRBC: 0 % (ref 0.0–0.2)

## 2020-03-09 LAB — RAPID URINE DRUG SCREEN, HOSP PERFORMED
Amphetamines: NOT DETECTED
Barbiturates: NOT DETECTED
Benzodiazepines: POSITIVE — AB
Cocaine: NOT DETECTED
Opiates: NOT DETECTED
Tetrahydrocannabinol: NOT DETECTED

## 2020-03-09 LAB — PROTIME-INR
INR: 1.3 — ABNORMAL HIGH (ref 0.8–1.2)
Prothrombin Time: 15.9 seconds — ABNORMAL HIGH (ref 11.4–15.2)

## 2020-03-09 LAB — COMPREHENSIVE METABOLIC PANEL
ALT: 21 U/L (ref 0–44)
AST: 20 U/L (ref 15–41)
Albumin: 3.4 g/dL — ABNORMAL LOW (ref 3.5–5.0)
Alkaline Phosphatase: 55 U/L (ref 38–126)
Anion gap: 5 (ref 5–15)
BUN: 36 mg/dL — ABNORMAL HIGH (ref 8–23)
CO2: 19 mmol/L — ABNORMAL LOW (ref 22–32)
Calcium: 8.9 mg/dL (ref 8.9–10.3)
Chloride: 108 mmol/L (ref 98–111)
Creatinine, Ser: 2.08 mg/dL — ABNORMAL HIGH (ref 0.61–1.24)
GFR, Estimated: 34 mL/min — ABNORMAL LOW (ref >60)
Glucose, Bld: 192 mg/dL — ABNORMAL HIGH (ref 70–99)
Potassium: >7.5 mmol/L (ref 3.5–5.1)
Sodium: 132 mmol/L — ABNORMAL LOW (ref 135–145)
Total Bilirubin: 0.4 mg/dL (ref 0.3–1.2)
Total Protein: 6.4 g/dL — ABNORMAL LOW (ref 6.5–8.1)

## 2020-03-09 LAB — POC SARS CORONAVIRUS 2 AG -  ED: SARS Coronavirus 2 Ag: NEGATIVE

## 2020-03-09 LAB — APTT: aPTT: 35 seconds (ref 24–36)

## 2020-03-09 LAB — ETHANOL: Alcohol, Ethyl (B): <10 mg/dL (ref <10)

## 2020-03-09 LAB — SARS CORONAVIRUS 2 BY RT PCR (HOSPITAL ORDER, PERFORMED IN ~~LOC~~ HOSPITAL LAB): SARS Coronavirus 2: NEGATIVE

## 2020-03-09 MED ORDER — AMLODIPINE BESYLATE 5 MG PO TABS
10.0000 mg | ORAL_TABLET | Freq: Every day | ORAL | Status: DC
  Administered 2020-03-09 – 2020-03-10 (×2): 10 mg via ORAL
  Filled 2020-03-09 (×2): qty 2

## 2020-03-09 MED ORDER — BASAGLAR KWIKPEN 100 UNIT/ML ~~LOC~~ SOPN: 25.0000 [IU] | PEN_INJECTOR | Freq: Every day | SUBCUTANEOUS | Status: DC

## 2020-03-09 MED ORDER — CALCIUM GLUCONATE-NACL 1-0.675 GM/50ML-% IV SOLN
1.0000 g | Freq: Once | INTRAVENOUS | Status: AC
  Administered 2020-03-09: 1000 mg via INTRAVENOUS
  Filled 2020-03-09: qty 50

## 2020-03-09 MED ORDER — SODIUM CHLORIDE 0.9 % IV BOLUS
250.0000 mL | Freq: Once | INTRAVENOUS | Status: AC
  Administered 2020-03-09: 250 mL via INTRAVENOUS

## 2020-03-09 MED ORDER — DEXTROSE 50 % IV SOLN
1.0000 | Freq: Once | INTRAVENOUS | Status: AC
  Administered 2020-03-09: 50 mL via INTRAVENOUS
  Filled 2020-03-09: qty 50

## 2020-03-09 MED ORDER — INSULIN GLARGINE 100 UNIT/ML ~~LOC~~ SOLN
25.0000 [IU] | Freq: Every day | SUBCUTANEOUS | Status: DC
  Administered 2020-03-09: 25 [IU] via SUBCUTANEOUS
  Filled 2020-03-09 (×4): qty 0.25

## 2020-03-09 MED ORDER — SODIUM CHLORIDE 0.9 % IV BOLUS
500.0000 mL | Freq: Once | INTRAVENOUS | Status: AC
  Administered 2020-03-09: 500 mL via INTRAVENOUS

## 2020-03-09 MED ORDER — CARVEDILOL 12.5 MG PO TABS
12.5000 mg | ORAL_TABLET | Freq: Two times a day (BID) | ORAL | Status: DC
  Administered 2020-03-10: 12.5 mg via ORAL
  Filled 2020-03-09: qty 1

## 2020-03-09 MED ORDER — INSULIN ASPART 100 UNIT/ML IV SOLN
10.0000 [IU] | Freq: Once | INTRAVENOUS | Status: AC
  Administered 2020-03-09: 10 [IU] via INTRAVENOUS

## 2020-03-09 MED ORDER — ATORVASTATIN CALCIUM 40 MG PO TABS
80.0000 mg | ORAL_TABLET | Freq: Every day | ORAL | Status: DC
  Administered 2020-03-10: 80 mg via ORAL
  Filled 2020-03-09: qty 2

## 2020-03-09 MED ORDER — SODIUM ZIRCONIUM CYCLOSILICATE 5 G PO PACK
10.0000 g | PACK | Freq: Two times a day (BID) | ORAL | Status: DC
  Administered 2020-03-09 – 2020-03-10 (×2): 10 g via ORAL
  Filled 2020-03-09 (×2): qty 2

## 2020-03-09 MED ORDER — HYDRALAZINE HCL 20 MG/ML IJ SOLN
5.0000 mg | Freq: Four times a day (QID) | INTRAMUSCULAR | Status: DC | PRN
  Administered 2020-03-09 – 2020-03-10 (×2): 5 mg via INTRAVENOUS
  Filled 2020-03-09 (×2): qty 1

## 2020-03-09 MED ORDER — AMLODIPINE BESYLATE 5 MG PO TABS: 5.0000 mg | ORAL_TABLET | Freq: Every day | ORAL | Status: DC

## 2020-03-09 MED ORDER — LACTULOSE 10 GM/15ML PO SOLN
30.0000 g | Freq: Once | ORAL | Status: AC
  Administered 2020-03-09: 30 g via ORAL
  Filled 2020-03-09: qty 60

## 2020-03-09 MED ORDER — HYDRALAZINE HCL 25 MG PO TABS
25.0000 mg | ORAL_TABLET | Freq: Four times a day (QID) | ORAL | Status: DC
  Administered 2020-03-10 (×2): 25 mg via ORAL
  Filled 2020-03-09 (×2): qty 1

## 2020-03-09 MED ORDER — SODIUM CHLORIDE 0.9 % IV SOLN
100.0000 mL/h | INTRAVENOUS | Status: DC
  Administered 2020-03-09 – 2020-03-10 (×3): 100 mL/h via INTRAVENOUS

## 2020-03-09 MED ORDER — APIXABAN 5 MG PO TABS
5.0000 mg | ORAL_TABLET | Freq: Two times a day (BID) | ORAL | Status: DC
  Administered 2020-03-09 – 2020-03-10 (×2): 5 mg via ORAL
  Filled 2020-03-09 (×2): qty 1

## 2020-03-09 MED ORDER — FUROSEMIDE 10 MG/ML IJ SOLN
40.0000 mg | Freq: Once | INTRAMUSCULAR | Status: AC
  Administered 2020-03-09: 40 mg via INTRAVENOUS
  Filled 2020-03-09: qty 4

## 2020-03-09 MED ORDER — SODIUM POLYSTYRENE SULFONATE 15 GM/60ML PO SUSP
60.0000 g | ORAL | Status: AC
  Administered 2020-03-09: 60 g via ORAL
  Filled 2020-03-09: qty 240

## 2020-03-09 MED ORDER — TIMOLOL MALEATE 0.5 % OP SOLN
1.0000 [drp] | Freq: Two times a day (BID) | OPHTHALMIC | Status: DC
  Administered 2020-03-09 – 2020-03-10 (×2): 1 [drp] via OPHTHALMIC
  Filled 2020-03-09: qty 5

## 2020-03-09 MED ORDER — IOHEXOL 350 MG/ML SOLN
60.0000 mL | Freq: Once | INTRAVENOUS | Status: AC | PRN
  Administered 2020-03-09: 60 mL via INTRAVENOUS

## 2020-03-09 NOTE — Consult Note (Signed)
[facility] Telemedicine Consult   Requesting Provider: Vanessa [place], T Consult Participants: Tiffany (bedside nurse) Kimmie(telestroke nurse) Location of the provider: [facility] Location of the patient: [facility]  This consult was provided via telemedicine with 2-way video and audio communication. The patient/family was informed that care would be provided in this way and agreed to receive care in this manner.    Chief Complaint: Trouble walking  HPI: 70 year old male with a history of vascular disease, atrial fibrillation, diabetes, hypertension, MI who presents with trouble walking started sometime after 10:30 AM this morning.  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  He then states that his legs started feeling like jelly, feeling this in both of his legs.  After arrival to the hospital, he began feeling like it may be more on the left than the right and may be having some symptoms in his left arm as well.    LKW: 10:30 AM tpa given?: No, anticoagulation IR Thrombectomy? No, no LVO  Exam: Vitals:   03/09/20 1313 03/09/20 1400  BP: 140/74 (!) 131/49  Pulse: (!) 45 (!) 52  Resp: 18 (!) 23  Temp: (!) 97.5 F (36.4 C)   SpO2: 100% 100%    General: In bed, no clear apparent distress  1A: Level of Consciousness - 0 1B: Ask Month and Age - 0 1C: 'Blink Eyes' & 'Squeeze Hands' - 0 2: Test Horizontal Extraocular Movements - 0 3: Test Visual Fields - 0 4: Test Facial Palsy - 1(mildly decreased nasolabial fold on the right) 5A: Test Left Arm Motor Drift - 0(he has some unusual appearing tremor, but no drift) 5B: Test Right Arm Motor Drift - 0 6A: Test Left Leg Motor Drift - 3(though he does lift against gravity when checking heel-knee-shin) 6B: Test Right Leg Motor Drift - 0 7: Test Limb Ataxia - 0 8: Test Sensation - 0 9: Test Language/Aphasia- 0 10: Test  Dysarthria - 0 11: Test Extinction/Inattention - 0 NIHSS score:  Four   Imaging Reviewed: CT/CTA-no acute findings  Labs reviewed in epic and pertinent values follow: Glucose 201   Assessment: 70 year old male with progressive difficulty walking.  He initially noted pain and weakness of bilateral lower extremities with subsequently noticing weakness of the left arm and leg as well.  Given that both legs were involved as well as the left arm, his localization is slightly difficult.  With rather abrupt onset and his risk factors, I do think it is reasonable to assess for ischemic stroke.  With bilateral leg as well as arm involvement, the cervical spine would also need to be considered.  Also, given pain, could consider claudication, either neurogenic or vascular.   Recommendations:  1) MRI brain and cervical spine 2) if this is negative, consider other pathology such as vascular claudication, and if there is any concern for spinal pathology, could consider MRI T and L-spine 3) would consider in person neurology consultation    This patient is receiving care for possible acute neurological changes. There was 45 minutes of care by this provider at the time of service, including time for direct evaluation via telemedicine, review of medical records, imaging studies and discussion of findings with providers, the patient and/or family.  Roland Rack, MD [facility] [phone]  If 7pm- 7am, please page neurology on call as listed in [place].

## 2020-03-09 NOTE — ED Notes (Signed)
Date and time results received: 03/09/20 1452 (use smartphrase ".now" to insert current time)  Test: Potassium Critical Value: >7.5  Name of Provider Notified: Melina Copa, [place]  Orders Received? Or Actions Taken?:

## 2020-03-09 NOTE — ED Provider Notes (Signed)
[facility] EMERGENCY DEPARTMENT Provider Note   CSN: [id number] Arrival date & time: 03/09/20  1254     History Chief Complaint  Patient presents with  . Extremity Weakness    Xavier Caldwell is a 70 y.o. male.  HPI      Xavier Caldwell is a 70 y.o. male with past history of congestive heart failure, atrial fibrillation, diabetes, hypertension, and peripheral artery disease presents to the Emergency Department with onset of bilateral lower extremity weakness and left arm weakness at 1030 this morning. States he was he was at work when symptoms began. Noticed weakness of bilateral legs, having difficulty standing and walking due to weakness. Symptoms were accompanied by shortness of breath. He left work and attempted to walk to his vehicle and fell striking his head on the car door. No loss of consciousness. He drove himself home, after risk continued to have shortness of breath with exertion and weakness of his legs and left arm. No history of stroke. History of MI March 2020. Echocardiogram showed EF of 45 to 50%. Currently he denies any shortness of breath associated with speech or at rest. No chest pain. He denies headache, facial weakness, speech difficulty visual changes, nausea or vomiting. He is anticoagulated on Eliquis for his atrial fibrillation. Also states that he had a Covid exposure at work recently. Denies cough, fever, chest pain or shortness of breath prior to today's incident.  PCP: Dr. Gerarda Fraction Cardiology: Dr. Carlyle Dolly   Past Medical History:  Diagnosis Date  . Acute systolic (congestive) heart failure ([place])   . Anxiety   . Arthritis   . Atrial fibrillation ([place])   . Back pain   . Coronary artery disease   . Diabetes mellitus without complication ([place])   . High cholesterol   . History of gout   . Hypertension   . Myocardial infarction ([place]) 04/2018  . Neuropathy   . PAD (peripheral artery disease) ([place])   . Retinal detachment    One on the right and two  on the left  . Sleep apnea    Stop Bang score of 4  . STEMI (ST elevation myocardial infarction) ([place])    04/08/2018 PCI/DES RCA  . VT (ventricular tachycardia) [facility])     Patient Active Problem List   Diagnosis Date Noted  . History of colonic polyps 10/05/2019  . Chronic anticoagulation 10/05/2019  . Controlled type 2 diabetes mellitus with stable proliferative retinopathy of both eyes, with long-term current use of insulin ([place]) 06/14/2019  . Left posterior capsular opacification 06/14/2019  . Right posterior capsular opacification 06/14/2019  . Primary open angle glaucoma of left eye, mild stage 06/14/2019  . PVD (peripheral vascular disease) ([place]) 06/08/2019  . Carotid stenosis 04/06/2019  . Mixed hyperlipidemia 04/24/2018  . Status post coronary artery stent placement   . Paroxysmal atrial fibrillation (HCC)   . Hematoma of groin 04/09/2018  . Symptomatic bradycardia - - ischemic, s/p TPM 04/08/2018  . Coronary artery disease involving native coronary artery of native heart with unstable angina pectoris ([place]) 04/08/2018  . Presence of drug coated stent in right coronary artery 04/08/2018  . Ventricular tachycardia ([place]) 04/08/2018  . Acute ST elevation myocardial infarction (STEMI) of inferior wall (HCC)   . Cervicalgia 04/16/2017  . Acute pain of left shoulder 04/16/2017  . Hemorrhoids 11/18/2016  . Taking multiple medications for chronic disease 09/18/2016  . Constipation 09/18/2016  . Uncontrolled type 2 diabetes mellitus with retinopathy, with long-term current use of  insulin ([place]) 01/09/2016  . Uncontrolled type 2 diabetes mellitus with complication, with long-term current use of insulin ([place]) 12/19/2014  . Hyperlipidemia associated with type 2 diabetes mellitus ([place]) 12/19/2014  . Essential hypertension, benign 12/19/2014    Past Surgical History:  Procedure Laterality Date  . APPENDECTOMY    . CARDIOVERSION N/A 04/13/2018   Procedure: CARDIOVERSION;  Surgeon:  Skeet Latch, MD;  Location: [place];  Service: Cardiovascular;  Laterality: N/A;  . CATARACT EXTRACTION W/PHACO Right 04/25/2014   Procedure: CATARACT EXTRACTION PHACO AND INTRAOCULAR LENS PLACEMENT ([place]);  Surgeon: Williams Che, MD;  Location: [facility];  Service: Ophthalmology;  Laterality: Right;  CDE:4.70  . CATARACT EXTRACTION W/PHACO Left 07/11/2014   Procedure: CATARACT EXTRACTION PHACO AND INTRAOCULAR LENS PLACEMENT LEFT EYE CDE=8.32;  Surgeon: Williams Che, MD;  Location: [facility];  Service: Ophthalmology;  Laterality: Left;  . COLONOSCOPY WITH PROPOFOL N/A 09/23/2016   [place]: 9 polyps removed, multiple tubular adenomas. recommended 3 yr surveillance  . COLONOSCOPY WITH PROPOFOL N/A 12/27/2019   ten sessile polyps, 4-9 mm in size in descending, hepatic flexure, and cecum, 11 mm polyp in sigmoid, three sessile polyps in rectum and mid rectum 2-3 mm in size. Tubular adenomas and hyperplastic. 3 year surveillance  . CORONARY/GRAFT ACUTE MI REVASCULARIZATION N/A 04/07/2018   Procedure: Coronary/Graft Acute MI Revascularization;  Surgeon: Burnell Blanks, MD;  Location: [place] [facility];  Service: Cardiovascular;  Laterality: N/A;  . LEFT HEART CATH AND CORONARY ANGIOGRAPHY N/A 04/07/2018   Procedure: LEFT HEART CATH AND CORONARY ANGIOGRAPHY;  Surgeon: Burnell Blanks, MD;  Location: [place] [facility];  Service: Cardiovascular;  Laterality: N/A;  . MELANOMA EXCISION  07/2019  . POLYPECTOMY  09/23/2016   Procedure: POLYPECTOMY;  Surgeon: Daneil Dolin, MD;  [facility];  Service: Endoscopy;;  colon  . POLYPECTOMY  12/27/2019   Procedure: POLYPECTOMY;  Surgeon: Daneil Dolin, MD;  [facility];  Service: Endoscopy;;  . RETINAL DETACHMENT SURGERY         Family History  Problem Relation Age of Onset  . Hypertension Father   . Colon cancer Neg Hx     Social History   Tobacco Use  . Smoking status: Former Smoker    Packs/day: 1.50     Years: 30.00    Pack years: 45.00    Types: Cigarettes    Quit date: 07/05/2012    Years since quitting: 7.6  . Smokeless tobacco: Never Used  Vaping Use  . Vaping Use: Never used  Substance Use Topics  . Alcohol use: No  . Drug use: No    Home Medications Prior to Admission medications   Medication Sig Start Date End Date Taking? Authorizing Provider  ALPRAZolam Duanne Moron) 0.5 MG tablet Take 0.5 mg by mouth 2 (two) times daily as needed for anxiety.    [provider]  apixaban (ELIQUIS) 5 MG TABS tablet Take 1 tablet (5 mg total) by mouth 2 (two) times daily. 04/05/19   Arnoldo Lenis, MD  atorvastatin (LIPITOR) 80 MG tablet Take 1 tablet (80 mg total) by mouth daily. 08/31/19 01/04/20  Arnoldo Lenis, MD  B Complex-C (SUPER B COMPLEX PO) Take 1 tablet by mouth daily.     [provider]  carvedilol (COREG) 12.5 MG tablet Take 1 tablet (12.5 mg total) by mouth 2 (two) times daily. 01/04/20 04/03/20  Arnoldo Lenis, MD  DULoxetine (CYMBALTA) 60 MG capsule Take 60 mg by mouth  daily with lunch.     [provider]  ergocalciferol (VITAMIN D2) 50000 units capsule Take 50,000 Units by mouth every Sunday.     [provider]  gabapentin (NEURONTIN) 300 MG capsule Take 300 mg by mouth 2 (two) times daily.  02/23/19   [provider]  glucose blood (ONETOUCH ULTRA) test strip USE ONE TEST STRIP AS DIRECTED TWICE DAILY 07/29/18   Cassandria Anger, MD  HYDROcodone-acetaminophen (NORCO/VICODIN) 5-325 MG per tablet Take 1 tablet by mouth every 6 (six) hours as needed for moderate pain.    [provider]  Insulin Glargine (BASAGLAR KWIKPEN) 100 UNIT/ML Inject 35 Units into the skin at bedtime. 03/07/20   Brita Romp, NP  linaclotide [facility]) 145 MCG CAPS capsule Take 1 capsule (145 mcg total) by mouth daily before breakfast. 10/05/19   Mahala Menghini, PA-C  lisinopril (ZESTRIL) 40 MG tablet Take 40 mg by mouth daily.    [provider]  naproxen (NAPROSYN) 375 MG tablet Take 375 mg by mouth 2 (two) times daily. 02/29/20   [provider]  nitroGLYCERIN (NITROSTAT) 0.4 MG SL tablet Place 1 tablet (0.4 mg total) under the tongue every 5 (five) minutes as needed. Patient taking differently: Place 0.4 mg under the tongue every 5 (five) minutes as needed for chest pain. 03/09/19   Arnoldo Lenis, MD  rOPINIRole (REQUIP) 1 MG tablet Take 1 mg by mouth 4 (four) times daily.     [provider]  timolol (TIMOPTIC) 0.5 % ophthalmic solution INSTILL 1 DROP INTO EACH EYE TWICE DAILY Patient taking differently: Place 1 drop into both eyes 2 (two) times daily. 08/24/19   Rankin, Clent Demark, MD  UNIFINE PENTIPS 31G X 5 MM MISC USE TO INJECT INSULIN AT BEDTIME Patient taking differently: at bedtime. 06/10/16   Cassandria Anger, MD    Allergies    Patient has no known allergies.  Review of Systems   Review of Systems  Constitutional: Negative for appetite change, chills, fatigue and fever.  HENT: Negative for sore throat and trouble swallowing.   Eyes: Negative for visual disturbance.  Respiratory: Negative for cough, shortness of breath and wheezing.   Cardiovascular: Negative for chest pain and palpitations.  Gastrointestinal: Negative for abdominal pain, nausea and vomiting.  Genitourinary: Negative for dysuria.  Musculoskeletal: Negative for arthralgias, back pain, myalgias, neck pain and neck stiffness.  Skin: Negative for rash.  Neurological: Negative for dizziness, seizures, syncope, speech difficulty, weakness, numbness and headaches.  Hematological: Does not bruise/bleed easily.  Psychiatric/Behavioral: Negative for confusion.    Physical Exam Updated Vital Signs BP 140/74 (BP Location: Right Arm)   Pulse (!) 45   Temp (!) 97.5 F (36.4 C) (Oral)   Resp 18   Ht 5\' 11"  (1.803 m)   Wt 98 kg   SpO2 100%   BMI 30.13 kg/m   Physical Exam Vitals and nursing note reviewed.   Constitutional:      General: He is not in acute distress.    Appearance: Normal appearance. He is not toxic-appearing.  HENT:     Head: Normocephalic and atraumatic.     Mouth/Throat:     Mouth: Mucous membranes are moist.  Eyes:     General: No visual field deficit.    Extraocular Movements: Extraocular movements intact.     Conjunctiva/sclera: Conjunctivae normal.     Pupils: Pupils are equal, round, and reactive to light.  Neck:     Thyroid: No thyromegaly.  Meningeal: Kernig's sign absent.  Cardiovascular:     Rate and Rhythm: Normal rate and regular rhythm.     Pulses: Normal pulses.  Pulmonary:     Effort: Pulmonary effort is normal.     Breath sounds: Normal breath sounds. No wheezing.  Abdominal:     Palpations: Abdomen is soft.     Tenderness: There is no abdominal tenderness. There is no guarding or rebound.  Musculoskeletal:        General: Normal range of motion.     Cervical back: Normal range of motion and neck supple. No tenderness.     Right lower leg: No edema.  Skin:    General: Skin is warm.     Capillary Refill: Capillary refill takes less than 2 seconds.     Findings: No rash.  Neurological:     Mental Status: He is alert and oriented to person, place, and time.     GCS: GCS eye subscore is 4. GCS verbal subscore is 5. GCS motor subscore is 6.     Cranial Nerves: No dysarthria or facial asymmetry.     Sensory: Sensation is intact.     Motor: Weakness present.     Comments: Patient has diminished grip strength on the left compared to right, slight left-sided pronator drift. Lower extremity weakness of left versus right.  No facial weakness or dysarthria.  Psychiatric:        Mood and Affect: Mood normal.        Thought Content: Thought content normal.     ED Results / Procedures / Treatments   Labs (all labs ordered are listed, but only abnormal results are displayed) Labs Reviewed  PROTIME-INR - Abnormal; Notable for the following  components:      Result Value   Prothrombin Time 15.9 (*)    INR 1.3 (*)    All other components within normal limits  CBC - Abnormal; Notable for the following components:   WBC 11.0 (*)    RBC 3.51 (*)    Hemoglobin 10.4 (*)    HCT 32.7 (*)    All other components within normal limits  DIFFERENTIAL - Abnormal; Notable for the following components:   Neutro Abs 8.8 (*)    All other components within normal limits  COMPREHENSIVE METABOLIC PANEL - Abnormal; Notable for the following components:   Sodium 132 (*)    Potassium >7.5 (*)    CO2 19 (*)    Glucose, Bld 192 (*)    BUN 36 (*)    Creatinine, Ser 2.08 (*)    Total Protein 6.4 (*)    Albumin 3.4 (*)    GFR, Estimated 34 (*)    All other components within normal limits  CBG MONITORING, ED - Abnormal; Notable for the following components:   Glucose-Capillary 201 (*)    All other components within normal limits  CBG MONITORING, ED - Abnormal; Notable for the following components:   Glucose-Capillary 162 (*)    All other components within normal limits  SARS CORONAVIRUS 2 BY RT PCR (HOSPITAL ORDER, [place] [facility])  ETHANOL  APTT  RAPID URINE DRUG SCREEN, HOSP PERFORMED  URINALYSIS, ROUTINE W REFLEX MICROSCOPIC  POC SARS CORONAVIRUS 2 AG -  ED    EKG EKG Interpretation  Date/Time:  Thursday March 09 2020 13:11:34 EST Ventricular Rate:  72 PR Interval:    QRS Duration: 134 QT Interval:  396 QTC Calculation: 433 R Axis:   -70 Text  Interpretation: Atrial fibrillation with premature ventricular or aberrantly conducted complexes Left axis deviation Left bundle branch block Abnormal ECG new afib and LBBB since prior 3/20 Confirmed by Aletta Edouard [phone]) on 03/09/2020 1:23:34 PM   Radiology CT Code Stroke CTA Head W/WO contrast  Result Date: 03/09/2020 CLINICAL DATA:  Focal neuro deficit greater than 6 hours. Left-sided weakness. EXAM: CT ANGIOGRAPHY HEAD AND NECK TECHNIQUE: Multidetector CT  imaging of the head and neck was performed using the standard protocol during bolus administration of intravenous contrast. Multiplanar CT image reconstructions and MIPs were obtained to evaluate the vascular anatomy. Carotid stenosis measurements (when applicable) are obtained utilizing NASCET criteria, using the distal internal carotid diameter as the denominator. CONTRAST:  76mL OMNIPAQUE IOHEXOL 350 MG/ML SOLN COMPARISON:  CT head 03/09/2020 FINDINGS: CTA NECK FINDINGS Aortic arch: Atherosclerotic calcification in the aorta and proximal great vessels. Negative for aneurysm or stenosis. Right carotid system: Extensive atherosclerotic calcification right carotid bifurcation. Minimal luminal diameter 1.2 mm corresponding to 74% diameter stenosis proximal right internal carotid artery. Left carotid system: Extensive atherosclerotic calcification left carotid bifurcation. Minimal luminal diameter 1.3 mm corresponding to 73% diameter stenosis. Vertebral arteries: Both vertebral arteries patent to the basilar. Mild stenosis at the origin of the vertebral artery bilaterally. Skeleton: Cervical spondylosis.  No acute skeletal abnormality. Other neck: Thyroid is enlarged and heterogeneous with multiple ill-defined nodules as well as small calcifications. Largest nodule on the right measures 3 cm Upper chest: Lung apices clear bilaterally. Review of the MIP images confirms the above findings CTA HEAD FINDINGS Anterior circulation: Atherosclerotic calcification in the cavernous carotid bilaterally with mild stenosis bilaterally. Anterior and middle cerebral arteries patent without stenosis or large vessel occlusion Posterior circulation: Mild atherosclerotic stenosis distal vertebral artery bilaterally. PICA patent bilaterally. Basilar widely patent. Superior cerebellar and posterior cerebral arteries patent without stenosis or large vessel occlusion. Venous sinuses: Normal venous enhancement Anatomic variants: None Review  of the MIP images confirms the above findings IMPRESSION: 1. 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 73% diameter stenosis proximal left internal carotid artery due to heavily calcified atherosclerotic plaque 3. Mild stenosis proximal and distal basilar bilaterally. 4. Mild stenosis in the cavernous carotid bilaterally 5. Negative for intracranial large vessel occlusion 6. Thyroid goiter with multiple nodules. Largest nodule on the right 3 cm. Thyroid ultrasound recommended. (Ref: J Am Coll Radiol. 2015 Feb;12(2): 143-50). Electronically Signed   By: Franchot Gallo M.D.   On: 03/09/2020 15:04   CT Code Stroke CTA Neck W/WO contrast  Result Date: 03/09/2020 CLINICAL DATA:  Focal neuro deficit greater than 6 hours. Left-sided weakness. EXAM: CT ANGIOGRAPHY HEAD AND NECK TECHNIQUE: Multidetector CT imaging of the head and neck was performed using the standard protocol during bolus administration of intravenous contrast. Multiplanar CT image reconstructions and MIPs were obtained to evaluate the vascular anatomy. Carotid stenosis measurements (when applicable) are obtained utilizing NASCET criteria, using the distal internal carotid diameter as the denominator. CONTRAST:  2mL OMNIPAQUE IOHEXOL 350 MG/ML SOLN COMPARISON:  CT head 03/09/2020 FINDINGS: CTA NECK FINDINGS Aortic arch: Atherosclerotic calcification in the aorta and proximal great vessels. Negative for aneurysm or stenosis. Right carotid system: Extensive atherosclerotic calcification right carotid bifurcation. Minimal luminal diameter 1.2 mm corresponding to 74% diameter stenosis proximal right internal carotid artery. Left carotid system: Extensive atherosclerotic calcification left carotid bifurcation. Minimal luminal diameter 1.3 mm corresponding to 73% diameter stenosis. Vertebral arteries: Both vertebral arteries patent to the basilar. Mild stenosis  at the origin of the vertebral  artery bilaterally. Skeleton: Cervical spondylosis.  No acute skeletal abnormality. Other neck: Thyroid is enlarged and heterogeneous with multiple ill-defined nodules as well as small calcifications. Largest nodule on the right measures 3 cm Upper chest: Lung apices clear bilaterally. Review of the MIP images confirms the above findings CTA HEAD FINDINGS Anterior circulation: Atherosclerotic calcification in the cavernous carotid bilaterally with mild stenosis bilaterally. Anterior and middle cerebral arteries patent without stenosis or large vessel occlusion Posterior circulation: Mild atherosclerotic stenosis distal vertebral artery bilaterally. PICA patent bilaterally. Basilar widely patent. Superior cerebellar and posterior cerebral arteries patent without stenosis or large vessel occlusion. Venous sinuses: Normal venous enhancement Anatomic variants: None Review of the MIP images confirms the above findings IMPRESSION: 1. 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 73% diameter stenosis proximal left internal carotid artery due to heavily calcified atherosclerotic plaque 3. Mild stenosis proximal and distal basilar bilaterally. 4. Mild stenosis in the cavernous carotid bilaterally 5. Negative for intracranial large vessel occlusion 6. Thyroid goiter with multiple nodules. Largest nodule on the right 3 cm. Thyroid ultrasound recommended. (Ref: J Am Coll Radiol. 2015 Feb;12(2): 143-50). Electronically Signed   By: Franchot Gallo M.D.   On: 03/09/2020 15:04   MR BRAIN WO CONTRAST  Result Date: 03/09/2020 CLINICAL DATA:  Neuro deficit, acute, stroke suspected. EXAM: MRI HEAD WITHOUT CONTRAST TECHNIQUE: Multiplanar, multiecho pulse sequences of the brain and surrounding structures were obtained without intravenous contrast. COMPARISON:  Head CT March 09, 2020. FINDINGS: Brain: No acute infarction, hemorrhage, hydrocephalus, extra-axial  collection or mass lesion. Few foci of T2 hyperintensity are seen within the white matter of the cerebral hemispheres, nonspecific. Vascular: Normal flow voids. Skull and upper cervical spine: Normal marrow signal. Sinuses/Orbits: Bilateral lens surgery. Paranasal sinuses are clear. IMPRESSION: 1. No acute intracranial abnormality. 2. Few foci of T2 hyperintensity within the white matter of the cerebral hemispheres, nonspecific but may represent mild chronic microvascular ischemic changes. Electronically Signed   By: Pedro Earls M.D.   On: 03/09/2020 16:05   MR CERVICAL SPINE WO CONTRAST  Result Date: 03/09/2020 CLINICAL DATA:  Myelopathy, acute or progressive. EXAM: MRI CERVICAL SPINE WITHOUT CONTRAST TECHNIQUE: Multiplanar, multisequence MR imaging of the cervical spine was performed. No intravenous contrast was administered. COMPARISON:  None. FINDINGS: Alignment: Minimal anterolisthesis of C5 over C6. Vertebrae: No fracture, evidence of discitis, or bone lesion. Marrow edema in the articular processes of the C4-5 facet joint. Cord: Normal signal and morphology. Posterior Fossa, vertebral arteries, paraspinal tissues: Enlarged thyroid gland with multiple T2 hyperintense nodules, the largest in the right lobe with heterogeneous signal measuring at least 2.7 cm. Disc levels: C2-3: Facet degenerative changes resulting in mild right neural foraminal narrowing. No spinal canal stenosis. C3-4: Uncovertebral and facet degenerative changes resulting in severe bilateral neural foraminal narrowing. No spinal canal stenosis. C4-5: Posterior disc protrusion resulting in mild spinal canal stenosis. Uncovertebral and facet degenerative changes resulting in severe bilateral neural foraminal narrowing. C5-6: Posterior disc protrusion resulting mild spinal canal stenosis. Uncovertebral and facet degenerative changes resulting in mild-to-moderate bilateral neural foraminal narrowing. C6-7: Posterior disc  osteophyte complex resulting in mild spinal stenosis. Uncovertebral and facet degenerative changes resulting in severe bilateral neural foraminal narrowing. C7-T1: No spinal canal or neural foraminal stenosis. IMPRESSION: 1. Multilevel degenerative changes of the cervical spine as described above, with mild spinal canal stenosis at C4-5, C5-6 and C6-7. 2. Severe bilateral neural foraminal  narrowing at C3-4, C4-5, and C6-7. 3. Marrow edema in the articular processes of the C4-5 facet joint. 4. Enlarged thyroid gland with multiple T2 hyperintense nodules, the largest in the right lobe measuring at least 2.7 cm. Correlation with non urgent ultrasound is recommended. Electronically Signed   By: Pedro Earls M.D.   On: 03/09/2020 16:12   CT HEAD CODE STROKE WO CONTRAST  Result Date: 03/09/2020 CLINICAL DATA:  Code stroke. Neuro deficit, acute stroke suspected. Left-sided leg/arm weakness which began today. EXAM: CT HEAD WITHOUT CONTRAST TECHNIQUE: Contiguous axial images were obtained from the base of the skull through the vertex without intravenous contrast. COMPARISON:  None. FINDINGS: Brain: No evidence of acute large vascular territory infarction, hemorrhage, hydrocephalus, extra-axial collection or mass lesion/mass effect. Mild patchy white matter hypoattenuation, nonspecific but most likely related to chronic microvascular ischemic disease. Vascular: Calcific atherosclerosis. No hyperdense vessel identified. Skull: No acute fracture. Sinuses/Orbits: Clear sinuses.  Unremarkable orbits. Other: No mastoid effusions. ASPECTS [facility] Stroke Program Early CT Score) Total score (0-10 with 10 being normal): 10 IMPRESSION: No evidence of acute intracranial abnormality.ASPECTS is 10. Code stroke imaging results were communicated on 03/09/2020 at 2:14 pm to provider Dr. Melina Copa via telephone, who verbally acknowledged these results. Electronically Signed   By: Margaretha Sheffield MD   On: 03/09/2020 14:16     Procedures Procedures    CRITICAL CARE Performed by: Unique Sillas Total critical care time: 40 minutes Critical care time was exclusive of separately billable procedures and treating other patients. Critical care was necessary to treat or prevent imminent or life-threatening deterioration. Critical care was time spent personally by me on the following activities: development of treatment plan with patient and/or surrogate as well as nursing, discussions with consultants, evaluation of patient's response to treatment, examination of patient, obtaining history from patient or surrogate, ordering and performing treatments and interventions, ordering and review of laboratory studies, ordering and review of radiographic studies, pulse oximetry and re-evaluation of patient's condition.  Medications Ordered in ED Medications  sodium chloride 0.9 % bolus 500 mL (500 mLs Intravenous New Bag/Given 03/09/20 1451)    Followed by  0.9 %  sodium chloride infusion (100 mL/hr Intravenous New Bag/Given 03/09/20 1451)  calcium gluconate 1 g/ 50 mL sodium chloride IVPB (has no administration in time range)  insulin aspart (novoLOG) injection 10 Units (has no administration in time range)    And  dextrose 50 % solution 50 mL (has no administration in time range)  iohexol (OMNIPAQUE) 350 MG/ML injection 60 mL (60 mLs Intravenous Contrast Given 03/09/20 1439)    ED Course  I have reviewed the triage vital signs and the nursing notes.  Pertinent labs & imaging results that were available during my care of the patient were reviewed by me and considered in my medical decision making (see chart for details).  Clinical Course as of 03/09/20 1514  Thu Mar 09, 3258  [id number] 70 year old male with history of vascular disease here with acute onset of bilateral leg weakness and inability to ambulate starting around 10 AM today.  No headache or slurred speech or visual symptoms.  No prior history of same.  Getting code  stroke activation CT labs EKG.  Initial EKG showing A. fib and new bundle branch block. [MB]    Clinical Course User Index [MB] Hayden Rasmussen, MD   MDM Rules/Calculators/A&P  Patient here with symptoms of left arm and bilateral lower extremity weakness since 10:30 AM this morning. No history of prior strokes. 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 No prior history of stroke/CVA. On exam patient has left upper and lower extremity weakness, no headache, visual changes facial droop or dysarthria. Code stroke activated.  Systolic blood pressures 0000000  NIH score: 4  EKG showing atrial fibrillation without RVR, hx of same, new left bundle branch block compared to previous EKG approximately 1 year ago. CT head without acute findings.  1440 tele neurology evaluation completed, discussed findings with neurologist, Dr. Leonel Ramsay.  Recommending CTA head and neck, if negative he recommends MRI brain.  If MRI also negative patient may need further evaluation of spine versus chest for aortic occlusion.  1510  CTA head and neck negative for LVO.  Will proceed with MR brain and c spine. Critical lab value called, potassium >7.5.  IV calcium gluconate and insulin ordered.   MRI brain and C spine also negative for evidence of a stroke, but patient will need admission for his hyperkalemia.  Discussed findings with [facility] hospitalist, Dr. Waldron Labs who agrees to admit patient.  Recommends patient also have Kayexalate    Final Clinical Impression(s) / ED Diagnoses Final diagnoses:  Hyperkalemia  Weakness of both lower extremities    Rx / DC Orders ED Discharge Orders    None       Bufford Lope 03/09/20 2225    Hayden Rasmussen, MD 03/10/20 [phone]

## 2020-03-09 NOTE — ED Notes (Signed)
Date and time results received: 03/09/20 2138 (use smartphrase ".now" to insert current time)  Test: Potassium Critical Value: 6.1  Name of Provider Notified: Elgergawy, MD  Orders Received? Or Actions Taken?:

## 2020-03-09 NOTE — H&P (Signed)
TRH H&P   Patient Demographics:    Xavier Caldwell, is a 70 y.o. male  MRN: [id number]   DOB - March 14, 1950  Admit Date - 03/09/2020  Outpatient Primary MD for the patient is Redmond School, MD  Referring MD/NP/PA: PA Triplett    Patient coming from: Home  Chief Complaint  Patient presents with  . Extremity Weakness      HPI:    Xavier Caldwell  is a 70 y.o. male, will past medical history of congestive heart failure, atrial fibrillation on Eliquis, diabetes mellitus, hypertension, peripheral artery disease, CAD, patient presents to ED due to bilateral lower extremity weakness, and left arm weakness that started 1030 this morning, patient report he was at his work when symptoms started, reports having difficulty standing and walking due to weakness, he does report some dyspnea as well, patient attempted to walk to his vehicle, but he fell striking his head on the car door, no loss of consciousness, he drove himself home, wife brought him to ED, patient currently denies any shortness of breath, fever or chills, patient was recently diagnosed with COPD, for which his Metformin has been stopped. -In ED patient had telemedicine and neurology consult MRI brain and cervical spine with no acute ischemic event (evidence of cervical radiculopathy), as well as CTA head and neck done, his creatinine was noted to be at 2.08, potassium was elevated at>7.5, his EKG showing old left bundle branch block and A. fib rhythm, CVA ruled out, [facility] hospitalist consulted to admit.    Review of systems:    In addition to the HPI above,  No Fever-chills, No Headache, No changes with Vision or hearing, No problems swallowing food or Liquids, No Chest pain, Cough or Shortness of Breath, No Abdominal pain, No Nausea or Vommitting, Bowel movements are regular, No Blood in stool or Urine, No dysuria, No new skin rashes  or bruises, No new joints pains-aches,  Patient presents with focal deficits, No recent weight gain or loss, No polyuria, polydypsia or polyphagia, No significant Mental Stressors.  A full 10 point Review of Systems was done, except as stated above, all other Review of Systems were negative.   With Past History of the following :    Past Medical History:  Diagnosis Date  . Acute systolic (congestive) heart failure ([place])   . Anxiety   . Arthritis   . Atrial fibrillation ([place])   . Back pain   . Coronary artery disease   . Diabetes mellitus without complication ([place])   . High cholesterol   . History of gout   . Hypertension   . Myocardial infarction ([place]) 04/2018  . Neuropathy   . PAD (peripheral artery disease) ([place])   . 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 Sleep apnea    Stop Bang score of 4  . STEMI (ST  elevation myocardial infarction) ([place])    04/08/2018 PCI/DES RCA  . VT (ventricular tachycardia) ([place])       Past Surgical History:  Procedure Laterality Date  . APPENDECTOMY    . CARDIOVERSION N/A 04/13/2018   Procedure: CARDIOVERSION;  Surgeon: Skeet Latch, MD;  Location: [employer];  Service: Cardiovascular;  Laterality: N/A;  . CATARACT EXTRACTION W/PHACO Right 04/25/2014   Procedure: CATARACT EXTRACTION PHACO AND INTRAOCULAR LENS PLACEMENT ([place]);  Surgeon: Williams Che, MD;  Location: [facility];  Service: Ophthalmology;  Laterality: Right;  CDE:4.70  . CATARACT EXTRACTION W/PHACO Left 07/11/2014   Procedure: CATARACT EXTRACTION PHACO AND INTRAOCULAR LENS PLACEMENT LEFT EYE CDE=8.32;  Surgeon: Williams Che, MD;  Location: [facility];  Service: Ophthalmology;  Laterality: Left;  . COLONOSCOPY WITH PROPOFOL N/A 09/23/2016   [place]: 9 polyps removed, multiple tubular adenomas. recommended 3 yr surveillance  . COLONOSCOPY WITH PROPOFOL N/A 12/27/2019   ten sessile polyps, 4-9 mm in size in descending, hepatic flexure, and cecum, 11 mm polyp in  sigmoid, three sessile polyps in rectum and mid rectum 2-3 mm in size. Tubular adenomas and hyperplastic. 3 year surveillance  . CORONARY/GRAFT ACUTE MI REVASCULARIZATION N/A 04/07/2018   Procedure: Coronary/Graft Acute MI Revascularization;  Surgeon: Burnell Blanks, MD;  Location: [place] [facility];  Service: Cardiovascular;  Laterality: N/A;  . LEFT HEART CATH AND CORONARY ANGIOGRAPHY N/A 04/07/2018   Procedure: LEFT HEART CATH AND CORONARY ANGIOGRAPHY;  Surgeon: Burnell Blanks, MD;  Location: [place] [facility];  Service: Cardiovascular;  Laterality: N/A;  . MELANOMA EXCISION  07/2019  . POLYPECTOMY  09/23/2016   Procedure: POLYPECTOMY;  Surgeon: Daneil Dolin, MD;  [facility];  Service: Endoscopy;;  colon  . POLYPECTOMY  12/27/2019   Procedure: POLYPECTOMY;  Surgeon: Daneil Dolin, MD;  [facility];  Service: Endoscopy;;  . RETINAL DETACHMENT SURGERY        Social History:     Social History   Tobacco Use  . Smoking status: Former Smoker    Packs/day: 1.50    Years: 30.00    Pack years: 45.00    Types: Cigarettes    Quit date: 07/05/2012    Years since quitting: 7.6  . Smokeless tobacco: Never Used  Substance Use Topics  . Alcohol use: No       Family History :     Family History  Problem Relation Age of Onset  . Hypertension Father   . Colon cancer Neg Hx       Home Medications:   Prior to Admission medications   Medication Sig Start Date End Date Taking? Authorizing Provider  ALPRAZolam Duanne Moron) 0.5 MG tablet Take 0.5 mg by mouth 2 (two) times daily as needed for anxiety.   Yes [provider]  apixaban (ELIQUIS) 5 MG TABS tablet Take 1 tablet (5 mg total) by mouth 2 (two) times daily. 04/05/19  Yes BranchAlphonse Guild, MD  atorvastatin (LIPITOR) 80 MG tablet Take 1 tablet (80 mg total) by mouth daily. 08/31/19 01/04/20 Yes Branch, Alphonse Guild, MD  B Complex-C (SUPER B COMPLEX PO) Take 1 tablet by mouth daily.    Yes  [provider]  carvedilol (COREG) 12.5 MG tablet Take 1 tablet (12.5 mg total) by mouth 2 (two) times daily. 01/04/20 04/03/20 Yes BranchAlphonse Guild, MD  DULoxetine (CYMBALTA) 60 MG capsule Take 60 mg by mouth daily with lunch.    Yes [provider]  ergocalciferol (VITAMIN  D2) 50000 units capsule Take 50,000 Units by mouth every Sunday.    Yes [provider]  gabapentin (NEURONTIN) 300 MG capsule Take 300 mg by mouth 2 (two) times daily.  02/23/19  Yes [provider]  HYDROcodone-acetaminophen (NORCO/VICODIN) 5-325 MG per tablet Take 1 tablet by mouth every 6 (six) hours as needed for moderate pain.   Yes [provider]  Insulin Glargine (BASAGLAR KWIKPEN) 100 UNIT/ML Inject 35 Units into the skin at bedtime. 03/07/20  Yes Brita Romp, NP  linaclotide [facility]) 145 MCG CAPS capsule Take 1 capsule (145 mcg total) by mouth daily before breakfast. 10/05/19  Yes Mahala Menghini, PA-C  naproxen (NAPROSYN) 375 MG tablet Take 375 mg by mouth 2 (two) times daily. 02/29/20  Yes [provider]  nitroGLYCERIN (NITROSTAT) 0.4 MG SL tablet Place 1 tablet (0.4 mg total) under the tongue every 5 (five) minutes as needed. Patient taking differently: Place 0.4 mg under the tongue every 5 (five) minutes as needed for chest pain. 03/09/19  Yes Branch, Alphonse Guild, MD  rOPINIRole (REQUIP) 1 MG tablet Take 1 mg by mouth 4 (four) times daily.    Yes [provider]  timolol (TIMOPTIC) 0.5 % ophthalmic solution INSTILL 1 DROP INTO EACH EYE TWICE DAILY Patient taking differently: Place 1 drop into both eyes 2 (two) times daily. 08/24/19  Yes Rankin, Clent Demark, MD  glucose blood (ONETOUCH ULTRA) test strip USE ONE TEST STRIP AS DIRECTED TWICE DAILY 07/29/18   Nida, Marella Chimes, MD  UNIFINE PENTIPS 31G X 5 MM MISC USE TO INJECT INSULIN AT BEDTIME Patient taking differently: at bedtime. 06/10/16   Cassandria Anger, MD     Allergies:    No Known  Allergies   Physical Exam:   Vitals  Blood pressure (!) 183/75, pulse 67, temperature (!) 97.5 F (36.4 C), temperature source Oral, resp. rate 19, height 5\' 11"  (1.803 m), weight 98 kg, SpO2 100 %.   1. General well developed male, lying in bed in NAD,  2. Normal affect and insight, Not Suicidal or Homicidal, Awake Alert, Oriented X 3.  3. No F.N deficits, ALL C.Nerves Intact, patient with left-sided weakness, lower extremity 2/5, upper extremity 3/5, right lower extremity 5/5, as discussed with patient nursing staff, this has significantly improved .  4. Ears and Eyes appear Normal, Conjunctivae clear, PERRLA. Moist Oral Mucosa.  5. Supple Neck, No JVD, No cervical lymphadenopathy appriciated, No Carotid Bruits.  6. Symmetrical Chest wall movement, Good air movement bilaterally, CTAB.  7. IRR IRR , No Gallops, Rubs or Murmurs, No Parasternal Heave.  8. Positive Bowel Sounds, Abdomen Soft, No tenderness, No organomegaly appriciated,No rebound -guarding or rigidity.  9.  No Cyanosis, Normal Skin Turgor, No Skin Rash or Bruise.  10. Good muscle tone,  joints appear normal , no effusions, Normal ROM.  11. No Palpable Lymph Nodes in Neck or Axillae    Data Review:    CBC Recent Labs  Lab 03/09/20 1350  WBC 11.0*  HGB 10.4*  HCT 32.7*  PLT 273  MCV 93.2  MCH 29.6  MCHC 31.8  RDW 13.3  LYMPHSABS 1.4  MONOABS 0.6  EOSABS 0.2  BASOSABS 0.1   ------------------------------------------------------------------------------------------------------------------  Chemistries  Recent Labs  Lab 03/09/20 1350  NA 132*  K >7.5*  CL 108  CO2 19*  GLUCOSE 192*  BUN 36*  CREATININE 2.08*  CALCIUM 8.9  AST 20  ALT 21  ALKPHOS 55  BILITOT 0.4   ------------------------------------------------------------------------------------------------------------------  estimated creatinine clearance is 41.1 mL/min (A) (by C-G formula based on SCr of 2.08 mg/dL  (H)). ------------------------------------------------------------------------------------------------------------------ No results for input(s): TSH, T4TOTAL, T3FREE, THYROIDAB in the last 72 hours.  Invalid input(s): FREET3  Coagulation profile Recent Labs  Lab 03/09/20 1350  INR 1.3*   ------------------------------------------------------------------------------------------------------------------- No results for input(s): DDIMER in the last 72 hours. -------------------------------------------------------------------------------------------------------------------  Cardiac Enzymes No results for input(s): CKMB, TROPONINI, MYOGLOBIN in the last 168 hours.  Invalid input(s): CK ------------------------------------------------------------------------------------------------------------------ No results found for: BNP   ---------------------------------------------------------------------------------------------------------------  Urinalysis    Component Value Date/Time   COLORURINE YELLOW 03/09/2020 1350   APPEARANCEUR CLEAR 03/09/2020 1350   LABSPEC 1.015 03/09/2020 1350   PHURINE 5.0 03/09/2020 1350   GLUCOSEU 50 (A) 03/09/2020 1350   HGBUR NEGATIVE 03/09/2020 [place] 03/09/2020 1350   [place] 03/09/2020 1350   PROTEINUR NEGATIVE 03/09/2020 1350   NITRITE NEGATIVE 03/09/2020 1350   LEUKOCYTESUR NEGATIVE 03/09/2020 1350    ----------------------------------------------------------------------------------------------------------------   Imaging Results:    CT Code Stroke CTA Head W/WO contrast  Result Date: 03/09/2020 CLINICAL DATA:  Focal neuro deficit greater than 6 hours. Left-sided weakness. EXAM: CT ANGIOGRAPHY HEAD AND NECK TECHNIQUE: Multidetector CT imaging of the head and neck was performed using the standard protocol during bolus administration of intravenous contrast. Multiplanar CT image reconstructions and MIPs were obtained  to evaluate the vascular anatomy. Carotid stenosis measurements (when applicable) are obtained utilizing NASCET criteria, using the distal internal carotid diameter as the denominator. CONTRAST:  96mL OMNIPAQUE IOHEXOL 350 MG/ML SOLN COMPARISON:  CT head 03/09/2020 FINDINGS: CTA NECK FINDINGS Aortic arch: Atherosclerotic calcification in the aorta and proximal great vessels. Negative for aneurysm or stenosis. Right carotid system: Extensive atherosclerotic calcification right carotid bifurcation. Minimal luminal diameter 1.2 mm corresponding to 74% diameter stenosis proximal right internal carotid artery. Left carotid system: Extensive atherosclerotic calcification left carotid bifurcation. Minimal luminal diameter 1.3 mm corresponding to 73% diameter stenosis. Vertebral arteries: Both vertebral arteries patent to the basilar. Mild stenosis at the origin of the vertebral artery bilaterally. Skeleton: Cervical spondylosis.  No acute skeletal abnormality. Other neck: Thyroid is enlarged and heterogeneous with multiple ill-defined nodules as well as small calcifications. Largest nodule on the right measures 3 cm Upper chest: Lung apices clear bilaterally. Review of the MIP images confirms the above findings CTA HEAD FINDINGS Anterior circulation: Atherosclerotic calcification in the cavernous carotid bilaterally with mild stenosis bilaterally. Anterior and middle cerebral arteries patent without stenosis or large vessel occlusion Posterior circulation: Mild atherosclerotic stenosis distal vertebral artery bilaterally. PICA patent bilaterally. Basilar widely patent. Superior cerebellar and posterior cerebral arteries patent without stenosis or large vessel occlusion. Venous sinuses: Normal venous enhancement Anatomic variants: None Review of the MIP images confirms the above findings IMPRESSION: 1. 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 73%  diameter stenosis proximal left internal carotid artery due to heavily calcified atherosclerotic plaque 3. Mild stenosis proximal and distal basilar bilaterally. 4. Mild stenosis in the cavernous carotid bilaterally 5. Negative for intracranial large vessel occlusion 6. Thyroid goiter with multiple nodules. Largest nodule on the right 3 cm. Thyroid ultrasound recommended. (Ref: J Am Coll Radiol. 2015 Feb;12(2): 143-50). Electronically Signed   By: Franchot Gallo M.D.   On: 03/09/2020 15:04   CT Code Stroke CTA Neck W/WO contrast  Result Date: 03/09/2020 CLINICAL DATA:  Focal neuro deficit greater than 6 hours. Left-sided weakness. EXAM: CT ANGIOGRAPHY HEAD AND NECK TECHNIQUE: Multidetector  CT imaging of the head and neck was performed using the standard protocol during bolus administration of intravenous contrast. Multiplanar CT image reconstructions and MIPs were obtained to evaluate the vascular anatomy. Carotid stenosis measurements (when applicable) are obtained utilizing NASCET criteria, using the distal internal carotid diameter as the denominator. CONTRAST:  50mL OMNIPAQUE IOHEXOL 350 MG/ML SOLN COMPARISON:  CT head 03/09/2020 FINDINGS: CTA NECK FINDINGS Aortic arch: Atherosclerotic calcification in the aorta and proximal great vessels. Negative for aneurysm or stenosis. Right carotid system: Extensive atherosclerotic calcification right carotid bifurcation. Minimal luminal diameter 1.2 mm corresponding to 74% diameter stenosis proximal right internal carotid artery. Left carotid system: Extensive atherosclerotic calcification left carotid bifurcation. Minimal luminal diameter 1.3 mm corresponding to 73% diameter stenosis. Vertebral arteries: Both vertebral arteries patent to the basilar. Mild stenosis at the origin of the vertebral artery bilaterally. Skeleton: Cervical spondylosis.  No acute skeletal abnormality. Other neck: Thyroid is enlarged and heterogeneous with multiple ill-defined nodules as well  as small calcifications. Largest nodule on the right measures 3 cm Upper chest: Lung apices clear bilaterally. Review of the MIP images confirms the above findings CTA HEAD FINDINGS Anterior circulation: Atherosclerotic calcification in the cavernous carotid bilaterally with mild stenosis bilaterally. Anterior and middle cerebral arteries patent without stenosis or large vessel occlusion Posterior circulation: Mild atherosclerotic stenosis distal vertebral artery bilaterally. PICA patent bilaterally. Basilar widely patent. Superior cerebellar and posterior cerebral arteries patent without stenosis or large vessel occlusion. Venous sinuses: Normal venous enhancement Anatomic variants: None Review of the MIP images confirms the above findings IMPRESSION: 1. 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 73% diameter stenosis proximal left internal carotid artery due to heavily calcified atherosclerotic plaque 3. Mild stenosis proximal and distal basilar bilaterally. 4. Mild stenosis in the cavernous carotid bilaterally 5. Negative for intracranial large vessel occlusion 6. Thyroid goiter with multiple nodules. Largest nodule on the right 3 cm. Thyroid ultrasound recommended. (Ref: J Am Coll Radiol. 2015 Feb;12(2): 143-50). Electronically Signed   By: Franchot Gallo M.D.   On: 03/09/2020 15:04   MR BRAIN WO CONTRAST  Result Date: 03/09/2020 CLINICAL DATA:  Neuro deficit, acute, stroke suspected. EXAM: MRI HEAD WITHOUT CONTRAST TECHNIQUE: Multiplanar, multiecho pulse sequences of the brain and surrounding structures were obtained without intravenous contrast. COMPARISON:  Head CT March 09, 2020. FINDINGS: Brain: No acute infarction, hemorrhage, hydrocephalus, extra-axial collection or mass lesion. Few foci of T2 hyperintensity are seen within the white matter of the cerebral hemispheres, nonspecific. Vascular: Normal flow voids. Skull and upper  cervical spine: Normal marrow signal. Sinuses/Orbits: Bilateral lens surgery. Paranasal sinuses are clear. IMPRESSION: 1. No acute intracranial abnormality. 2. Few foci of T2 hyperintensity within the white matter of the cerebral hemispheres, nonspecific but may represent mild chronic microvascular ischemic changes. Electronically Signed   By: Pedro Earls M.D.   On: 03/09/2020 16:05   MR CERVICAL SPINE WO CONTRAST  Result Date: 03/09/2020 CLINICAL DATA:  Myelopathy, acute or progressive. EXAM: MRI CERVICAL SPINE WITHOUT CONTRAST TECHNIQUE: Multiplanar, multisequence MR imaging of the cervical spine was performed. No intravenous contrast was administered. COMPARISON:  None. FINDINGS: Alignment: Minimal anterolisthesis of C5 over C6. Vertebrae: No fracture, evidence of discitis, or bone lesion. Marrow edema in the articular processes of the C4-5 facet joint. Cord: Normal signal and morphology. Posterior Fossa, vertebral arteries, paraspinal tissues: Enlarged thyroid gland with multiple T2 hyperintense nodules, the largest in the right lobe with heterogeneous signal measuring at  least 2.7 cm. Disc levels: C2-3: Facet degenerative changes resulting in mild right neural foraminal narrowing. No spinal canal stenosis. C3-4: Uncovertebral and facet degenerative changes resulting in severe bilateral neural foraminal narrowing. No spinal canal stenosis. C4-5: Posterior disc protrusion resulting in mild spinal canal stenosis. Uncovertebral and facet degenerative changes resulting in severe bilateral neural foraminal narrowing. C5-6: Posterior disc protrusion resulting mild spinal canal stenosis. Uncovertebral and facet degenerative changes resulting in mild-to-moderate bilateral neural foraminal narrowing. C6-7: Posterior disc osteophyte complex resulting in mild spinal stenosis. Uncovertebral and facet degenerative changes resulting in severe bilateral neural foraminal narrowing. C7-T1: No spinal canal or  neural foraminal stenosis. IMPRESSION: 1. Multilevel degenerative changes of the cervical spine as described above, with mild spinal canal stenosis at C4-5, C5-6 and C6-7. 2. Severe bilateral neural foraminal narrowing at C3-4, C4-5, and C6-7. 3. Marrow edema in the articular processes of the C4-5 facet joint. 4. Enlarged thyroid gland with multiple T2 hyperintense nodules, the largest in the right lobe measuring at least 2.7 cm. Correlation with non urgent ultrasound is recommended. Electronically Signed   By: Baldemar Lenis M.D.   On: 03/09/2020 16:12   CT HEAD CODE STROKE WO CONTRAST  Result Date: 03/09/2020 CLINICAL DATA:  Code stroke. Neuro deficit, acute stroke suspected. Left-sided leg/arm weakness which began today. EXAM: CT HEAD WITHOUT CONTRAST TECHNIQUE: Contiguous axial images were obtained from the base of the skull through the vertex without intravenous contrast. COMPARISON:  None. FINDINGS: Brain: No evidence of acute large vascular territory infarction, hemorrhage, hydrocephalus, extra-axial collection or mass lesion/mass effect. Mild patchy white matter hypoattenuation, nonspecific but most likely related to chronic microvascular ischemic disease. Vascular: Calcific atherosclerosis. No hyperdense vessel identified. Skull: No acute fracture. Sinuses/Orbits: Clear sinuses.  Unremarkable orbits. Other: No mastoid effusions. ASPECTS [facility] Stroke Program Early CT Score) Total score (0-10 with 10 being normal): 10 IMPRESSION: No evidence of acute intracranial abnormality.ASPECTS is 10. Code stroke imaging results were communicated on 03/09/2020 at 2:14 pm to provider Dr. Charm Barges via telephone, who verbally acknowledged these results. Electronically Signed   By: Feliberto Harts MD   On: 03/09/2020 14:16    My personal review of EKG: A. fib with heart rate of 91, QTc of 472, left bundle branch block, old   Assessment & Plan:    Active Problems:   Uncontrolled type 2 diabetes  mellitus with complication, with long-term current use of insulin (HCC)   Hyperlipidemia associated with type 2 diabetes mellitus (HCC)   Essential hypertension, benign   Paroxysmal atrial fibrillation (HCC)   PVD (peripheral vascular disease) (HCC)   Hyperkalemia   Hyperkalemia -With potassium of>7.5,This is most likely in the setting of using lisinopril, with worsening renal function, and possibly underlining RTA -EKG showing A. fib, with left bundle branch block at baseline -Patient will be admitted to ICU, patient received calcium gluconate, IV insulin followed by D50 x2, and Kayexalate, as well will start on IV fluids and Lasix as well, will check BMP every 4 hours, and repeat hyperkalemia protocol as needed -We will discontinue lisinopril -We will consult renal   Weakness -This is most likely in the setting of hyperkalemia .  It is improving -Telemedicine neurology consult greatly appreciated, MRI brain and cervical spine with no findings to contribute to weakness, cervical radiculopathy was noted, discussed with Dr. Amada Jupiter from neurology, these findings are chronic, and noncontributory to his weakness. -Consult PT/OT  History of A. fib -Continue with apixaban -Continue with Coreg for heart rate  control  Diabetes mellitus -We will resume Basaglar at a lower dose 35 units> 25 units, will add insulin sliding scale  Hypertension -Continue with Coreg, will add as needed hydralazine, blood pressure controlled in ED, so we will start on Norvasc.  -  Certainly will discontinue lisinopril due to hyperkalemia.  Diabetic neuropathy -Continue with gabapentin  CKD stage IIIB - avoid nephrotoxic medication - will keep on gentle hydration given he received IV contrast for CTA head and neck.  Glaucoma -Continue with home medications  Hyperlipidemia -Continue with home dose statin   DVT Prophylaxis : on apixaban for A fib  AM Labs Ordered, also please review Full  Orders  Family Communication: Admission, patients condition and plan of care including tests being ordered have been discussed with the patient and wife at bedside who indicate understanding and agree with the plan and Code Status.  Code Status Full  Likely DC to  home  Condition GUARDED    Consults called: renal requested on EPIC    Admission status: inpatient    Time spent in minutes : 60 minutes   Phillips Climes M.D on 03/09/2020 at 6:25 PM   [facility] - Office  [phone]

## 2020-03-09 NOTE — ED Triage Notes (Signed)
Weakness both legs for 3 hours, able to drive to the hospital. C/o shortness of breath, fell against car and hit head on car, no LOC

## 2020-03-09 NOTE — ED Notes (Signed)
Called [facility] for Lantus

## 2020-03-09 NOTE — Progress Notes (Signed)
Code Stroke Time Documentation   6644 Call Time 0347 Beeper Time 1400 Exam Started  4259 Exam [id number] Images sent to [place] Exam completed in [place] radiology called

## 2020-03-09 NOTE — ED Notes (Signed)
Date and time results received: 03/09/20 1826 (use smartphrase ".now" to insert current time)  Test: Potassium Critical Value: 7.5  Test: Glucose Critical Value:  627  Name of Provider Notified: Elgergawy, MD  Orders Received? Or Actions Taken?:

## 2020-03-10 DIAGNOSIS — E1169 Type 2 diabetes mellitus with other specified complication: Secondary | ICD-10-CM | POA: Diagnosis not present

## 2020-03-10 DIAGNOSIS — E875 Hyperkalemia: Secondary | ICD-10-CM | POA: Diagnosis not present

## 2020-03-10 DIAGNOSIS — I10 Essential (primary) hypertension: Secondary | ICD-10-CM

## 2020-03-10 DIAGNOSIS — E785 Hyperlipidemia, unspecified: Secondary | ICD-10-CM

## 2020-03-10 DIAGNOSIS — I480 Paroxysmal atrial fibrillation: Secondary | ICD-10-CM

## 2020-03-10 LAB — I-STAT CHEM 8, ED
BUN: 36 mg/dL — ABNORMAL HIGH (ref 8–23)
Calcium, Ion: 1.22 mmol/L (ref 1.15–1.40)
Chloride: 113 mmol/L — ABNORMAL HIGH (ref 98–111)
Creatinine, Ser: 2 mg/dL — ABNORMAL HIGH (ref 0.61–1.24)
Glucose, Bld: 112 mg/dL — ABNORMAL HIGH (ref 70–99)
HCT: 32 % — ABNORMAL LOW (ref 39.0–52.0)
Hemoglobin: 10.9 g/dL — ABNORMAL LOW (ref 13.0–17.0)
Potassium: 8 mmol/L (ref 3.5–5.1)
Sodium: 135 mmol/L (ref 135–145)
TCO2: 20 mmol/L — ABNORMAL LOW (ref 22–32)

## 2020-03-10 LAB — BASIC METABOLIC PANEL
Anion gap: 5 (ref 5–15)
BUN: 30 mg/dL — ABNORMAL HIGH (ref 8–23)
CO2: 22 mmol/L (ref 22–32)
Calcium: 8.9 mg/dL (ref 8.9–10.3)
Chloride: 111 mmol/L (ref 98–111)
Creatinine, Ser: 1.77 mg/dL — ABNORMAL HIGH (ref 0.61–1.24)
GFR, Estimated: 42 mL/min — ABNORMAL LOW (ref >60)
Glucose, Bld: 61 mg/dL — ABNORMAL LOW (ref 70–99)
Potassium: 4.5 mmol/L (ref 3.5–5.1)
Sodium: 138 mmol/L (ref 135–145)

## 2020-03-10 LAB — CBC
HCT: 34.5 % — ABNORMAL LOW (ref 39.0–52.0)
Hemoglobin: 11 g/dL — ABNORMAL LOW (ref 13.0–17.0)
MCH: 29.3 pg (ref 26.0–34.0)
MCHC: 31.9 g/dL (ref 30.0–36.0)
MCV: 92 fL (ref 80.0–100.0)
Platelets: 241 10*3/uL (ref 150–400)
RBC: 3.75 MIL/uL — ABNORMAL LOW (ref 4.22–5.81)
RDW: 13.3 % (ref 11.5–15.5)
WBC: 11.7 10*3/uL — ABNORMAL HIGH (ref 4.0–10.5)
nRBC: 0 % (ref 0.0–0.2)

## 2020-03-10 LAB — CBG MONITORING, ED
Glucose-Capillary: 49 mg/dL — ABNORMAL LOW (ref 70–99)
Glucose-Capillary: 184 mg/dL — ABNORMAL HIGH (ref 70–99)

## 2020-03-10 LAB — HIV ANTIBODY (ROUTINE TESTING W REFLEX): HIV Screen 4th Generation wRfx: NONREACTIVE

## 2020-03-10 LAB — POTASSIUM
Potassium: 5.2 mmol/L — ABNORMAL HIGH (ref 3.5–5.1)
Potassium: 5 mmol/L (ref 3.5–5.1)

## 2020-03-10 MED ORDER — AMLODIPINE BESYLATE 10 MG PO TABS
10.0000 mg | ORAL_TABLET | Freq: Every day | ORAL | 1 refills | Status: AC
Start: 2020-03-11 — End: ?

## 2020-03-10 MED ORDER — BASAGLAR KWIKPEN 100 UNIT/ML ~~LOC~~ SOPN: 30.0000 [IU] | PEN_INJECTOR | Freq: Every day | SUBCUTANEOUS | 3 refills | Status: DC

## 2020-03-10 MED ORDER — HYDRALAZINE HCL 25 MG PO TABS: 25.0000 mg | ORAL_TABLET | Freq: Three times a day (TID) | ORAL | 0 refills | Status: DC

## 2020-03-10 MED ORDER — INSULIN GLARGINE 100 UNIT/ML ~~LOC~~ SOLN
20.0000 [IU] | Freq: Every day | SUBCUTANEOUS | Status: DC
  Filled 2020-03-10 (×3): qty 0.2

## 2020-03-10 MED ORDER — HYDRALAZINE HCL 25 MG PO TABS
50.0000 mg | ORAL_TABLET | Freq: Three times a day (TID) | ORAL | Status: DC
Start: 2020-03-10 — End: 2020-03-10

## 2020-03-10 MED ORDER — DEXTROSE 50 % IV SOLN
1.0000 | Freq: Once | INTRAVENOUS | Status: AC
  Administered 2020-03-10: 50 mL via INTRAVENOUS
  Filled 2020-03-10: qty 50

## 2020-03-10 MED ORDER — DOUBLE ANTIBIOTIC 500-10000 UNIT/GM EX OINT
TOPICAL_OINTMENT | Freq: Two times a day (BID) | CUTANEOUS | Status: DC
  Administered 2020-03-10: 1 via TOPICAL
  Filled 2020-03-10: qty 1

## 2020-03-10 NOTE — Progress Notes (Addendum)
Inpatient Diabetes Program Recommendations  AACE/ADA: New Consensus Statement on Inpatient Glycemic Control   Target Ranges:  Prepandial:   less than 140 mg/dL      Peak postprandial:   less than 180 mg/dL (1-2 hours)      Critically ill patients:  140 - 180 mg/dL  Results for REFAEL, FULOP (MRN [id number]) as of 03/10/2020 08:23  Ref. Range 12/27/2019 06:29 03/09/2020 13:13 03/09/2020 14:11 03/10/2020 06:39 03/10/2020 07:30  Glucose-Capillary Latest Ref Range: 70 - 99 mg/dL 123 (H) 201 (H) 162 (H) 49 (L) 184 (H)    Review of Glycemic Control  Diabetes history: DM2 Outpatient Diabetes medications: Basaglar 35 units QHS Current orders for Inpatient glycemic control: Lantus 25 units QHS  Inpatient Diabetes Program Recommendations:    Insulin: Please consider decreasing Lantus to 20 units QHS, ordering CBGs AC&HS, Novolog 0-9 units TID with meals, and Novolog 0-5 units QHS.  Thanks, Barnie Alderman, RN, MSN, CDE Diabetes Coordinator Inpatient Diabetes Program [phone] (Team Pager from 8am to 5pm)

## 2020-03-10 NOTE — ED Notes (Signed)
Pt resting in room, medicated per MAR. IVF infusing without difficulty. Drinks provided, [facility] and urinals within reach, personal items and call bell within reach. Bed locked and low. Pt denies further needs at this time. On cardiac monitor and pulse ox, will continue to monitor.

## 2020-03-10 NOTE — ED Notes (Addendum)
Hypoglycemic Event  CBG: 49   Treatment: 4 oz juice/soda and D50 25 mL (12.5 gm) given by previous RN  Symptoms: Sweaty  Follow-up CBG: Time:0730 CBG Result:184  Possible Reasons for Event: Inadequate meal intake  Comments/MD notified:MD notified by previous RN

## 2020-03-10 NOTE — ED Notes (Signed)
Date and time results received: 03/10/20 0639   Test: POC blood glucose Critical Value: 63  Name of Provider Notified: Dr. Clearence Ped  Orders Received? Or Actions Taken?: 1am D50 and oral snacks

## 2020-03-10 NOTE — ED Notes (Signed)
Pt medicated per MAR, eating crackers and drinking orange juice without complication. Will continue to monitor.

## 2020-03-10 NOTE — Care Management Obs Status (Signed)
[place] NOTIFICATION   Patient Details  Name: Xavier Caldwell MRN: [id number] Date of Birth: 1950/08/16   Medicare Observation Status Notification Given:  Yes    Boneta Lucks, RN 03/10/2020, 12:02 PM

## 2020-03-10 NOTE — TOC Transition Note (Signed)
Transition of Care [facility]) - CM/SW Discharge Note  Patient Details  Name: Xavier Caldwell MRN: [id number] Date of Birth: 27-Aug-1950  [facility] of Care [facility]) CM/SW Contact:  Sherie Don, LCSW Phone Number: 03/10/2020, 11:41 AM  Clinical Narrative: PT evaluation recommended HHPT. CSW spoke with patient and patient is agreeable to HHPT. CSW made referral to Helene Kelp with [facility] as it is in-network with [employer]. Referral accepted. CSW updated patient. TOC signing off.  Final next level of care: [place] Barriers to Discharge: Barriers Resolved  Patient Goals and CMS Choice Patient states their goals for this hospitalization and ongoing recovery are:: Get closer to baseline with ADLs CMS Medicare.gov Compare Post Acute Care list provided to:: Patient Choice offered to / list presented to : Patient  Discharge Plan and Services        DME Arranged: N/A DME Agency: NA HH Arranged: PT [employer] Agency: [facility] (formerly [employer]) Date [place]: 03/10/20 Time [place]: 50 Representative spoke with at [place]: Helene Kelp  Readmission Risk Interventions No flowsheet data found.

## 2020-03-10 NOTE — Discharge Summary (Addendum)
Physician Discharge Summary  Xavier Caldwell [id number] DOB: [birth year] DOA: 03/09/2020  PCP: Redmond School, MD  Admit date: 03/09/2020 Discharge date: 03/10/2020  Admitted From: Home Disposition:  Home  Recommendations for Outpatient Follow-up:  1. Follow up with PCP in 1-2 weeks 2. Patient's kidney disease is likely secondary to longstanding hypertension. He is being discharged on hydralazine, amlodipine, and his home Coreg. BP Medication regimen should be optimized on an outpatient basis.   Home Health: [facility] Equipment/Devices:N/a  Discharge Condition: Able to ambulate with minimal assistance CODE STATUS: Full Diet recommendation: Heart healthy diet   Brief/Interim Summary:  Hyperkalemia: Xavier Caldwell presented to [facility] on 2/4 with generalized weakness. Code stroke was activated and stroke workup was negative. However, he was found to have a potassium of 8.0. He received calcium gluconate, insulin, and kayexalate and his K quickly came down to 5.0. He regained his strength and was able to ambulate with minimal assistance. It was thought that this increase in potassium was secondary to recent increase in ACE inhibitor. 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 His lisinopril was stopped in the setting of his AKI.  His AKI improved with gentle IV hydration and improved PO intake.   Diabetes Patient is on 35 units of Basaglar insulin at home and reports frequent episodes of hypoglycemia in the mornings His morning dose has been decreased to 30 units, follow-up with endocrinologist  Paroxysmal atrial fibrillation Heart rate currently stable and is anticoagulated with apixaban   Discharge Diagnoses:  Active Problems:   Uncontrolled type 2 diabetes mellitus with complication, with  long-term current use of insulin (HCC)   Hyperlipidemia associated with type 2 diabetes mellitus ([place])   Essential hypertension, benign   Paroxysmal atrial fibrillation (HCC)   PVD (peripheral vascular disease) (HCC)   Hyperkalemia    Discharge Instructions  Discharge Instructions    Diet - low sodium heart healthy   Complete by: As directed    Increase activity slowly   Complete by: As directed      Allergies as of 03/10/2020   No Known Allergies     Medication List    STOP taking these medications   naproxen 375 MG tablet Commonly known as: NAPROSYN     TAKE these medications   ALPRAZolam 0.5 MG tablet Commonly known as: XANAX Take 0.5 mg by mouth 2 (two) times daily as needed for anxiety.   amLODipine 10 MG tablet Commonly known as: NORVASC Take 1 tablet (10 mg total) by mouth daily. Start taking on: March 11, 2020   apixaban 5 MG Tabs tablet Commonly known as: Eliquis Take 1 tablet (5 mg total) by mouth 2 (two) times daily.   atorvastatin 80 MG tablet Commonly known as: LIPITOR Take 1 tablet (80 mg total) by mouth daily.   Basaglar KwikPen 100 UNIT/ML Inject 30 Units into the skin at bedtime. What changed: how much to take   carvedilol 12.5 MG tablet Commonly known as: COREG Take 1 tablet (12.5 mg total) by mouth 2 (two) times daily.   DULoxetine 60 MG capsule Commonly known as: CYMBALTA Take 60 mg by mouth daily with lunch.   ergocalciferol 1.25 MG (50000 UT) capsule Commonly known as: VITAMIN D2 Take 50,000 Units by mouth every Sunday.   gabapentin 300 MG capsule  Commonly known as: NEURONTIN Take 300 mg by mouth 2 (two) times daily.   hydrALAZINE 25 MG tablet Commonly known as: APRESOLINE Take 1 tablet (25 mg total) by mouth 3 (three) times daily.   HYDROcodone-acetaminophen 5-325 MG tablet Commonly known as: NORCO/VICODIN Take 1 tablet by mouth every 6 (six) hours as needed for moderate pain.   linaclotide 145 MCG Caps  capsule Commonly known as: Linzess Take 1 capsule (145 mcg total) by mouth daily before breakfast.   nitroGLYCERIN 0.4 MG SL tablet Commonly known as: Nitrostat Place 1 tablet (0.4 mg total) under the tongue every 5 (five) minutes as needed. What changed: reasons to take this   OneTouch Ultra test strip Generic drug: glucose blood USE ONE TEST STRIP AS DIRECTED TWICE DAILY   rOPINIRole 1 MG tablet Commonly known as: REQUIP Take 1 mg by mouth 4 (four) times daily.   SUPER B COMPLEX PO Take 1 tablet by mouth daily.   timolol 0.5 % ophthalmic solution Commonly known as: TIMOPTIC INSTILL 1 DROP INTO EACH EYE TWICE DAILY What changed: See the new instructions.   Unifine Pentips 31G X 5 MM Misc Generic drug: Insulin Pen Needle USE TO INJECT INSULIN AT BEDTIME What changed: See the new instructions.       Follow-up Information    Redmond School, MD. Schedule an appointment as soon as possible for a visit in 1 week(s).   Specialty: Internal Medicine Contact information: [address] [place] [id number] [phone]              No Known Allergies  Consultations:  None   Procedures/Studies: CT Code Stroke CTA Head W/WO contrast  Result Date: 03/09/2020 CLINICAL DATA:  Focal neuro deficit greater than 6 hours. Left-sided weakness. EXAM: CT ANGIOGRAPHY HEAD AND NECK TECHNIQUE: Multidetector CT imaging of the head and neck was performed using the standard protocol during bolus administration of intravenous contrast. Multiplanar CT image reconstructions and MIPs were obtained to evaluate the vascular anatomy. Carotid stenosis measurements (when applicable) are obtained utilizing NASCET criteria, using the distal internal carotid diameter as the denominator. CONTRAST:  22mL OMNIPAQUE IOHEXOL 350 MG/ML SOLN COMPARISON:  CT head 03/09/2020 FINDINGS: CTA NECK FINDINGS Aortic arch: Atherosclerotic calcification in the aorta and proximal great vessels. Negative for  aneurysm or stenosis. Right carotid system: Extensive atherosclerotic calcification right carotid bifurcation. Minimal luminal diameter 1.2 mm corresponding to 74% diameter stenosis proximal right internal carotid artery. Left carotid system: Extensive atherosclerotic calcification left carotid bifurcation. Minimal luminal diameter 1.3 mm corresponding to 73% diameter stenosis. Vertebral arteries: Both vertebral arteries patent to the basilar. Mild stenosis at the origin of the vertebral artery bilaterally. Skeleton: Cervical spondylosis.  No acute skeletal abnormality. Other neck: Thyroid is enlarged and heterogeneous with multiple ill-defined nodules as well as small calcifications. Largest nodule on the right measures 3 cm Upper chest: Lung apices clear bilaterally. Review of the MIP images confirms the above findings CTA HEAD FINDINGS Anterior circulation: Atherosclerotic calcification in the cavernous carotid bilaterally with mild stenosis bilaterally. Anterior and middle cerebral arteries patent without stenosis or large vessel occlusion Posterior circulation: Mild atherosclerotic stenosis distal vertebral artery bilaterally. PICA patent bilaterally. Basilar widely patent. Superior cerebellar and posterior cerebral arteries patent without stenosis or large vessel occlusion. Venous sinuses: Normal venous enhancement Anatomic variants: None Review of the MIP images confirms the above findings IMPRESSION: 1. 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 73% diameter stenosis proximal left internal carotid  artery due to heavily calcified atherosclerotic plaque 3. Mild stenosis proximal and distal basilar bilaterally. 4. Mild stenosis in the cavernous carotid bilaterally 5. Negative for intracranial large vessel occlusion 6. Thyroid goiter with multiple nodules. Largest nodule on the right 3 cm. Thyroid ultrasound recommended. (Ref: J Am Coll Radiol.  2015 Feb;12(2): 143-50). Electronically Signed   By: Franchot Gallo M.D.   On: 03/09/2020 15:04   CT Code Stroke CTA Neck W/WO contrast  Result Date: 03/09/2020 CLINICAL DATA:  Focal neuro deficit greater than 6 hours. Left-sided weakness. EXAM: CT ANGIOGRAPHY HEAD AND NECK TECHNIQUE: Multidetector CT imaging of the head and neck was performed using the standard protocol during bolus administration of intravenous contrast. Multiplanar CT image reconstructions and MIPs were obtained to evaluate the vascular anatomy. Carotid stenosis measurements (when applicable) are obtained utilizing NASCET criteria, using the distal internal carotid diameter as the denominator. CONTRAST:  78mL OMNIPAQUE IOHEXOL 350 MG/ML SOLN COMPARISON:  CT head 03/09/2020 FINDINGS: CTA NECK FINDINGS Aortic arch: Atherosclerotic calcification in the aorta and proximal great vessels. Negative for aneurysm or stenosis. Right carotid system: Extensive atherosclerotic calcification right carotid bifurcation. Minimal luminal diameter 1.2 mm corresponding to 74% diameter stenosis proximal right internal carotid artery. Left carotid system: Extensive atherosclerotic calcification left carotid bifurcation. Minimal luminal diameter 1.3 mm corresponding to 73% diameter stenosis. Vertebral arteries: Both vertebral arteries patent to the basilar. Mild stenosis at the origin of the vertebral artery bilaterally. Skeleton: Cervical spondylosis.  No acute skeletal abnormality. Other neck: Thyroid is enlarged and heterogeneous with multiple ill-defined nodules as well as small calcifications. Largest nodule on the right measures 3 cm Upper chest: Lung apices clear bilaterally. Review of the MIP images confirms the above findings CTA HEAD FINDINGS Anterior circulation: Atherosclerotic calcification in the cavernous carotid bilaterally with mild stenosis bilaterally. Anterior and middle cerebral arteries patent without stenosis or large vessel occlusion  Posterior circulation: Mild atherosclerotic stenosis distal vertebral artery bilaterally. PICA patent bilaterally. Basilar widely patent. Superior cerebellar and posterior cerebral arteries patent without stenosis or large vessel occlusion. Venous sinuses: Normal venous enhancement Anatomic variants: None Review of the MIP images confirms the above findings IMPRESSION: 1. 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 73% diameter stenosis proximal left internal carotid artery due to heavily calcified atherosclerotic plaque 3. Mild stenosis proximal and distal basilar bilaterally. 4. Mild stenosis in the cavernous carotid bilaterally 5. Negative for intracranial large vessel occlusion 6. Thyroid goiter with multiple nodules. Largest nodule on the right 3 cm. Thyroid ultrasound recommended. (Ref: J Am Coll Radiol. 2015 Feb;12(2): 143-50). Electronically Signed   By: Franchot Gallo M.D.   On: 03/09/2020 15:04   MR BRAIN WO CONTRAST  Result Date: 03/09/2020 CLINICAL DATA:  Neuro deficit, acute, stroke suspected. EXAM: MRI HEAD WITHOUT CONTRAST TECHNIQUE: Multiplanar, multiecho pulse sequences of the brain and surrounding structures were obtained without intravenous contrast. COMPARISON:  Head CT March 09, 2020. FINDINGS: Brain: No acute infarction, hemorrhage, hydrocephalus, extra-axial collection or mass lesion. Few foci of T2 hyperintensity are seen within the white matter of the cerebral hemispheres, nonspecific. Vascular: Normal flow voids. Skull and upper cervical spine: Normal marrow signal. Sinuses/Orbits: Bilateral lens surgery. Paranasal sinuses are clear. IMPRESSION: 1. No acute intracranial abnormality. 2. Few foci of T2 hyperintensity within the white matter of the cerebral hemispheres, nonspecific but may represent mild chronic microvascular ischemic changes. Electronically Signed   By: Pedro Earls M.D.   On: 03/09/2020  16:05   MR CERVICAL SPINE WO CONTRAST  Result Date: 03/09/2020 CLINICAL DATA:  Myelopathy, acute or progressive. EXAM: MRI CERVICAL SPINE WITHOUT CONTRAST TECHNIQUE: Multiplanar, multisequence MR imaging of the cervical spine was performed. No intravenous contrast was administered. COMPARISON:  None. FINDINGS: Alignment: Minimal anterolisthesis of C5 over C6. Vertebrae: No fracture, evidence of discitis, or bone lesion. Marrow edema in the articular processes of the C4-5 facet joint. Cord: Normal signal and morphology. Posterior Fossa, vertebral arteries, paraspinal tissues: Enlarged thyroid gland with multiple T2 hyperintense nodules, the largest in the right lobe with heterogeneous signal measuring at least 2.7 cm. Disc levels: C2-3: Facet degenerative changes resulting in mild right neural foraminal narrowing. No spinal canal stenosis. C3-4: Uncovertebral and facet degenerative changes resulting in severe bilateral neural foraminal narrowing. No spinal canal stenosis. C4-5: Posterior disc protrusion resulting in mild spinal canal stenosis. Uncovertebral and facet degenerative changes resulting in severe bilateral neural foraminal narrowing. C5-6: Posterior disc protrusion resulting mild spinal canal stenosis. Uncovertebral and facet degenerative changes resulting in mild-to-moderate bilateral neural foraminal narrowing. C6-7: Posterior disc osteophyte complex resulting in mild spinal stenosis. Uncovertebral and facet degenerative changes resulting in severe bilateral neural foraminal narrowing. C7-T1: No spinal canal or neural foraminal stenosis. IMPRESSION: 1. Multilevel degenerative changes of the cervical spine as described above, with mild spinal canal stenosis at C4-5, C5-6 and C6-7. 2. Severe bilateral neural foraminal narrowing at C3-4, C4-5, and C6-7. 3. Marrow edema in the articular processes of the C4-5 facet joint. 4. Enlarged thyroid gland with multiple T2 hyperintense nodules, the largest in the  right lobe measuring at least 2.7 cm. Correlation with non urgent ultrasound is recommended. Electronically Signed   By: Pedro Earls M.D.   On: 03/09/2020 16:12   CT HEAD CODE STROKE WO CONTRAST  Result Date: 03/09/2020 CLINICAL DATA:  Code stroke. Neuro deficit, acute stroke suspected. Left-sided leg/arm weakness which began today. EXAM: CT HEAD WITHOUT CONTRAST TECHNIQUE: Contiguous axial images were obtained from the base of the skull through the vertex without intravenous contrast. COMPARISON:  None. FINDINGS: Brain: No evidence of acute large vascular territory infarction, hemorrhage, hydrocephalus, extra-axial collection or mass lesion/mass effect. Mild patchy white matter hypoattenuation, nonspecific but most likely related to chronic microvascular ischemic disease. Vascular: Calcific atherosclerosis. No hyperdense vessel identified. Skull: No acute fracture. Sinuses/Orbits: Clear sinuses.  Unremarkable orbits. Other: No mastoid effusions. ASPECTS [facility] Stroke Program Early CT Score) Total score (0-10 with 10 being normal): 10 IMPRESSION: No evidence of acute intracranial abnormality.ASPECTS is 10. Code stroke imaging results were communicated on 03/09/2020 at 2:14 pm to provider Dr. Melina Copa via telephone, who verbally acknowledged these results. Electronically Signed   By: Margaretha Sheffield MD   On: 03/09/2020 14:16      Subjective: Xavier Caldwell reports that he is feeling much better today. He feels strong enough to return home. He feels that he and his wife will be able to manage his medications and plans to follow-up with his PCP within a week. He is amenable to working with [facility] to rebuild strength.  He has no complaints at this time. No CP, SOB, headache, weakness, or numbness  Discharge Exam: Vitals:   03/10/20 0733 03/10/20 0800 03/10/20 0900 03/10/20 1045  BP: (!) 169/79 (!) 177/77 (!) 184/89 (!) 173/70  Pulse: 85 88  72  Resp: (!) 25 18  (!) 25  Temp:      TempSrc:       SpO2: 100% 100%  100%  Weight:      Height:        General: Pt is alert, awake, not in acute distress Cardiovascular: RRR, S1/S2 +, no rubs, no gallops Respiratory: CTA bilaterally, no wheezing, no rhonchi Abdominal: Soft, NT, ND, bowel sounds + Extremities: no edema, no cyanosis    The results of significant diagnostics from this hospitalization (including imaging, microbiology, ancillary and laboratory) are listed below for reference.     Microbiology: Recent Results (from the past 240 hour(s))  SARS Coronavirus 2 by RT PCR (hospital order, performed in [facility] hospital lab) Nasopharyngeal Nasopharyngeal Swab     Status: None   Collection Time: 03/09/20  1:50 PM   Specimen: Nasopharyngeal Swab  Result Value Ref Range Status   SARS Coronavirus 2 NEGATIVE NEGATIVE Final    Comment: (NOTE) SARS-CoV-2 target nucleic acids are NOT DETECTED.  The SARS-CoV-2 RNA is generally detectable in upper and lower respiratory specimens during the acute phase of infection. The lowest concentration of SARS-CoV-2 viral copies this assay can detect is 250 copies / mL. A negative result does not preclude SARS-CoV-2 infection and should not be used as the sole basis for treatment or other patient management decisions.  A negative result may occur with improper specimen collection / handling, submission of specimen other than nasopharyngeal swab, presence of viral mutation(s) within the areas targeted by this assay, and inadequate number of viral copies (<250 copies / mL). A negative result must be combined with clinical observations, patient history, and epidemiological information.  Fact Sheet for Patients:   StrictlyIdeas.no  Fact Sheet for Healthcare Providers: BankingDealers.co.za  This test is not yet approved or  cleared by the [place] FDA and has been authorized for detection and/or diagnosis of SARS-CoV-2 by FDA under an  Emergency Use Authorization (EUA).  This EUA will remain in effect (meaning this test can be used) for the duration of the COVID-19 declaration under Section 564(b)(1) of the Act, 21 U.S.C. section 360bbb-3(b)(1), unless the authorization is terminated or revoked sooner.  Performed at [facility], [address] [place], [place] [address]      Labs: BNP (last 3 results) No results for input(s): BNP in the last 8760 hours. Basic Metabolic Panel: Recent Labs  Lab 03/09/20 1350 03/09/20 1510 03/09/20 1742 03/09/20 2059 03/10/20 0057 03/10/20 0421 03/10/20 0639  NA 132* 135 132* 137  --  138  --   K >7.5* 8.0* 7.5* 6.1* 5.2* 4.5 5.0  CL 108 113* 111 111  --  111  --   CO2 19*  --  20* 20*  --  22  --   GLUCOSE 192* 112* 627* 73  --  61*  --   BUN 36* 36* 34* 33*  --  30*  --   CREATININE 2.08* 2.00* 2.07* 2.02*  --  1.77*  --   CALCIUM 8.9  --  8.4* 9.1  --  8.9  --    Liver Function Tests: Recent Labs  Lab 03/09/20 1350  AST 20  ALT 21  ALKPHOS 55  BILITOT 0.4  PROT 6.4*  ALBUMIN 3.4*   No results for input(s): LIPASE, AMYLASE in the last 168 hours. No results for input(s): AMMONIA in the last 168 hours. CBC: Recent Labs  Lab 03/09/20 1350 03/09/20 1510 03/10/20 0421  WBC 11.0*  --  11.7*  NEUTROABS 8.8*  --   --   HGB 10.4* 10.9* 11.0*  HCT 32.7* 32.0* 34.5*  MCV 93.2  --  92.0  PLT 273  --  241   Cardiac Enzymes: No results for input(s): CKTOTAL, CKMB, CKMBINDEX, TROPONINI in the last 168 hours. BNP: Invalid input(s): POCBNP CBG: Recent Labs  Lab 03/09/20 1313 03/09/20 1411 03/10/20 0639 03/10/20 0730  GLUCAP 201* 162* 49* 184*   D-Dimer No results for input(s): DDIMER in the last 72 hours. Hgb A1c No results for input(s): HGBA1C in the last 72 hours. Lipid Profile No results for input(s): CHOL, HDL, LDLCALC, TRIG, CHOLHDL, LDLDIRECT in the last 72 hours. Thyroid function studies No results for input(s): TSH, T4TOTAL, T3FREE, THYROIDAB in  the last 72 hours.  Invalid input(s): FREET3 Anemia work up No results for input(s): VITAMINB12, FOLATE, FERRITIN, TIBC, IRON, RETICCTPCT in the last 72 hours. Urinalysis    Component Value Date/Time   COLORURINE YELLOW 03/09/2020 1350   APPEARANCEUR CLEAR 03/09/2020 1350   LABSPEC 1.015 03/09/2020 1350   PHURINE 5.0 03/09/2020 1350   GLUCOSEU 50 (A) 03/09/2020 1350   HGBUR NEGATIVE 03/09/2020 1350   BILIRUBINUR NEGATIVE 03/09/2020 1350   KETONESUR NEGATIVE 03/09/2020 1350   PROTEINUR NEGATIVE 03/09/2020 1350   NITRITE NEGATIVE 03/09/2020 1350   LEUKOCYTESUR NEGATIVE 03/09/2020 1350   Sepsis Labs Invalid input(s): PROCALCITONIN,  WBC,  LACTICIDVEN Microbiology Recent Results (from the past 240 hour(s))  SARS Coronavirus 2 by RT PCR (hospital order, performed in [place] hospital lab) Nasopharyngeal Nasopharyngeal Swab     Status: None   Collection Time: 03/09/20  1:50 PM   Specimen: Nasopharyngeal Swab  Result Value Ref Range Status   SARS Coronavirus 2 NEGATIVE NEGATIVE Final    Comment: (NOTE) SARS-CoV-2 target nucleic acids are NOT DETECTED.  The SARS-CoV-2 RNA is generally detectable in upper and lower respiratory specimens during the acute phase of infection. The lowest concentration of SARS-CoV-2 viral copies this assay can detect is 250 copies / mL. A negative result does not preclude SARS-CoV-2 infection and should not be used as the sole basis for treatment or other patient management decisions.  A negative result may occur with improper specimen collection / handling, submission of specimen other than nasopharyngeal swab, presence of viral mutation(s) within the areas targeted by this assay, and inadequate number of viral copies (<250 copies / mL). A negative result must be combined with clinical observations, patient history, and epidemiological information.  Fact Sheet for Patients:   StrictlyIdeas.no  Fact Sheet for Healthcare  Providers: BankingDealers.co.za  This test is not yet approved or  cleared by the [place] FDA and has been authorized for detection and/or diagnosis of SARS-CoV-2 by FDA under an Emergency Use Authorization (EUA).  This EUA will remain in effect (meaning this test can be used) for the duration of the COVID-19 declaration under Section 564(b)(1) of the Act, 21 U.S.C. section 360bbb-3(b)(1), unless the authorization is terminated or revoked sooner.  Performed at [facility], [address], [place], [place] [address]      Time coordinating discharge: 63mins  SIGNED:   Pearla Dubonnet, Medical Student  [facility] 03/10/2020, 11:40 AM   If 7PM-7AM, please contact night-coverage [email or web address]  Attending note:   Patient seen and examined with Pearla Dubonnet, Medical student. In addition to supervising the encounter, I played a key role in the decision making process as well as reviewed key findings.  Agree with assessment and plan as noted above.  [employer]

## 2020-03-10 NOTE — ED Notes (Signed)
Per Education officer, museum pt. Received their 44 discharge info.

## 2020-03-10 NOTE — Care Management CC44 (Signed)
Condition Code 44 Documentation Completed  Patient Details  Name: Xavier Caldwell MRN: [id number] Date of Birth: [birth year]   Condition Code 44 given:  Yes Patient signature on Condition Code 44 notice:  Yes Documentation of 2 MD's agreement:  Yes Code 44 added to claim:  Yes    Boneta Lucks, RN 03/10/2020, 12:02 PM

## 2020-03-10 NOTE — ED Notes (Signed)
Pt 0421 labs have a blood glucose of 61, repeat blood glucose at [place] 0639 is 49. Dr. Clearence Ped aware, pt can tolerate PO. Per Dr. Clearence Ped orders placed for 1 amp dextrose 50% and pt to be provided with snacks.

## 2020-03-10 NOTE — Evaluation (Signed)
Physical Therapy Evaluation Patient Details Name: Xavier Caldwell MRN: [id number] DOB: 1950-05-30 Today's Date: 03/10/2020   History of Present Illness  Xavier Caldwell  is a 70 y.o. male, will past medical history of congestive heart failure, atrial fibrillation on Eliquis, diabetes mellitus, hypertension, peripheral artery disease, CAD, patient presents to ED due to bilateral lower extremity weakness, and left arm weakness that started 1030 this morning, patient report he was at his work when symptoms started, reports having difficulty standing and walking due to weakness, he does report some dyspnea as well, patient attempted to walk to his vehicle, but he fell striking his head on the car door, no loss of consciousness, he drove himself home, wife brought him to ED, patient currently denies any shortness of breath, fever or chills, patient was recently diagnosed with COPD, for which his Metformin has been stopped.  -In ED patient had telemedicine and neurology consult MRI brain and cervical spine with no acute ischemic event (evidence of cervical radiculopathy), as well as CTA head and neck done, his creatinine was noted to be at 2.08, potassium was elevated at>7.5, his EKG showing old left bundle branch block and A. fib rhythm, CVA ruled out, [facility] hospitalist consulted to admit.    Clinical Impression  Patient functioning near baseline for functional mobility and gait demonstrating good return for ambulating in room/hallways using SPC without loss of balance.  Plan:  Patient discharged from physical therapy to care of nursing for ambulation daily as tolerated for length of stay.     Follow Up Recommendations Home health PT;Supervision for mobility/OOB;Supervision - Intermittent    Equipment Recommendations  None recommended by PT    Recommendations for Other Services       Precautions / Restrictions Precautions Precautions: None Restrictions Weight Bearing Restrictions: No      Mobility   Bed Mobility Overal bed mobility: Modified Independent             General bed mobility comments: slightly increased time    Transfers Overall transfer level: Modified independent                  Ambulation/Gait Ambulation/Gait assistance: Modified independent (Device/Increase time) Gait Distance (Feet): 200 Feet Assistive device: Straight cane Gait Pattern/deviations: Decreased step length - right;Decreased step length - left;Decreased stride length Gait velocity: decreased   General Gait Details: slightly labored cadence with good return for mostly step-to pattern without loss of balance  Stairs            Wheelchair Mobility    Modified Rankin (Stroke Patients Only)       Balance Overall balance assessment: Needs assistance Sitting-balance support: Feet supported;No upper extremity supported Sitting balance-Leahy Scale: Good     Standing balance support: During functional activity;Single extremity supported Standing balance-Leahy Scale: Fair Standing balance comment: fair/good using SPC                             Pertinent Vitals/Pain      Home Living Family/patient expects to be discharged to:: Private residence Living Arrangements: Spouse/significant other;Alone Available Help at Discharge: Family;Available 24 hours/day Type of Home: House Home Access: Stairs to enter Entrance Stairs-Rails: Right;Left;Can reach both Entrance Stairs-Number of Steps: 2 Home Layout: One level Home Equipment: Walker - 4 wheels;Bedside commode;Grab bars - tub/shower;Cane - single point      Prior Function Level of Independence: Independent         Comments: community  ambulator, drives, works as a Event organiser Extremity Assessment Upper Extremity Assessment: Overall WFL for tasks assessed    Lower Extremity Assessment Lower Extremity Assessment: Generalized weakness     Cervical / Trunk Assessment Cervical / Trunk Assessment: Normal  Communication   Communication: No difficulties  Cognition Arousal/Alertness: Awake/alert Behavior During Therapy: WFL for tasks assessed/performed Overall Cognitive Status: Within Functional Limits for tasks assessed                                        General Comments      Exercises     Assessment/Plan    PT Assessment All further PT needs can be met in the next venue of care  PT Problem List Decreased strength;Decreased activity tolerance;Decreased balance;Decreased mobility       PT Treatment Interventions      PT Goals (Current goals can be found in the Care Plan section)  Acute Rehab PT Goals Patient Stated Goal: return home with family to assist PT Goal Formulation: With patient/family Time For Goal Achievement: 03/10/20 Potential to Achieve Goals: Good    Frequency     Barriers to discharge        Co-evaluation               AM-PAC PT "6 Clicks" Mobility  Outcome Measure Help needed turning from your back to your side while in a flat bed without using bedrails?: None Help needed moving from lying on your back to sitting on the side of a flat bed without using bedrails?: None Help needed moving to and from a bed to a chair (including a wheelchair)?: None Help needed standing up from a chair using your arms (e.g., wheelchair or bedside chair)?: None Help needed to walk in hospital room?: A Little Help needed climbing 3-5 steps with a railing? : A Little 6 Click Score: 22    End of Session   Activity Tolerance: Patient tolerated treatment well Patient left: in bed;with call bell/phone within reach;with family/visitor present Nurse Communication: Mobility status PT Visit Diagnosis: Unsteadiness on feet (R26.81);Other abnormalities of gait and mobility (R26.89);Muscle weakness (generalized) (M62.81)    Time: 1700-1749 PT Time Calculation (min) (ACUTE ONLY): 29  min   Charges:   PT Evaluation $PT Eval Moderate Complexity: 1 Mod PT Treatments $Therapeutic Activity: 23-37 mins        12:27 PM, 03/10/20 Lonell Grandchild, MPT Physical Therapist with [facility] [phone] office [phone] mobile phone

## 2020-03-10 NOTE — Discharge Instructions (Signed)
Hyperkalemia Hyperkalemia occurs when the level of potassium in your blood is too high. Potassium is an important nutrient that helps the muscles and nerves function normally. It affects how the heart works, and it helps keep fluids and minerals balanced in the body. If there is too much potassium in your blood, it can affect your heart's ability to function normally. Potassium is normally removed (excreted) from the body by the kidneys. Hyperkalemia can result from various conditions. It can range from mild to severe. What are the causes? This condition may be caused by:  Taking in too much potassium. You can do this by: ? Using salt substitutes. They contain large amounts of potassium. ? Taking potassium supplements. ? Eating foods that are high in potassium.  Excreting too little potassium. This can happen if: ? Your kidneys are not working properly. Kidney (renal) disease, including short-term or long-term renal failure, is a common cause of hyperkalemia. ? You are taking medicines that lower your excretion of potassium. ? You have Addison's disease. ? You have a urinary tract blockage, such as kidney stones. ? You are on treatment to mechanically clean your blood (dialysis) and you skip a treatment.  Releasing a high amount of potassium from your cells into your blood. This can happen with: ? Injury to muscles (rhabdomyolysis) or other tissues. Most potassium is stored in your muscles. ? Severe burns or infections. ? Acidic blood plasma (acidosis). Acidosis can result from many diseases, such as uncontrolled diabetes. What increases the risk? The following factors may make you more likely to develop this condition:  Kidney disease. This puts you at the highest risk.  Addison's disease. This is a condition where the adrenal glands do not produce enough hormones.  Alcoholism or heavy drug use.  Using certain blood pressure medicines, such as ACE inhibitors, angiotensin II receptor  blockers (ARBs), or potassium-sparing diuretics such as spironolactone.  Severe injury or burn. What are the signs or symptoms? In many cases, there are no symptoms. However, when your potassium level becomes high enough, you may have symptoms such as:  An irregular or very slow heartbeat.  Nausea.  Tiredness (fatigue).  Confusion.  Tingling of your skin or numbness of your hands or feet.  Muscle cramps.  Muscle weakness.  Not being able to move (paralysis). How is this diagnosed? This condition may be diagnosed based on:  Your symptoms and medical history. Your health care provider will ask about your use of prescription and non-prescription drugs.  A physical exam.  Blood tests.  An electrocardiogram (ECG). How is this treated? Treatment depends on the cause and severity of your condition. Treatment may need to be done in the hospital setting. Treatment may include:  IV glucose (sugar) along with insulin to shift potassium out of your blood and into your cells.  A medicine called albuterol to shift potassium out of your blood and into your cells.  Medicines to remove the potassium from your body.  Dialysis to remove the potassium from your body.  Calcium to protect your heart from the effects of high potassium, such as irregular rhythms (arrhythmias). Follow these instructions at home:  Take over-the-counter and prescription medicines only as told by your health care provider.  Do not take any supplements, natural products, herbs, or vitamins without reviewing them with your health care provider. Certain supplements and natural food products contain high amounts of potassium.  Limit your alcohol intake as told by your health care provider.  Do not use drugs.   If you need help quitting, ask your health care provider.  If you have kidney disease, you may need to follow a low-potassium diet. A dietitian can help you learn which foods have high or low amounts of  potassium.  Keep all follow-up visits as told by your health care provider. This is important.   Contact a health care provider if you:  Have an irregular or very slow heartbeat.  Feel light-headed.  Feel weak.  Are nauseous.  Have tingling or numbness in your hands or feet. Get help right away if you:  Have shortness of breath.  Have chest pain or discomfort.  Pass out.  Have muscle paralysis. Summary  Hyperkalemia occurs when the level of potassium in your blood is too high.  This condition may be caused by taking in too much potassium, excreting too little potassium, or releasing a high amount of potassium from your cells into your blood.  Hyperkalemia can result from many underlying conditions, especially chronic kidney disease, or from taking certain medicines.  Treatment of hyperkalemia may include medicine to shift potassium out of your blood and into your cells or to remove the potassium from your body.  If you have kidney disease, you may need to follow a low-potassium diet. A dietitian can help you learn which foods have high or low amounts of potassium. This information is not intended to replace advice given to you by your health care provider. Make sure you discuss any questions you have with your health care provider. Document Revised: 06/24/2019 Document Reviewed: 06/24/2019 Elsevier Patient Education  2021 Elsevier Inc.  

## 2020-03-15 DIAGNOSIS — I472 Ventricular tachycardia: Secondary | ICD-10-CM | POA: Diagnosis not present

## 2020-03-15 DIAGNOSIS — I5021 Acute systolic (congestive) heart failure: Secondary | ICD-10-CM | POA: Diagnosis not present

## 2020-03-15 DIAGNOSIS — M109 Gout, unspecified: Secondary | ICD-10-CM | POA: Diagnosis not present

## 2020-03-15 DIAGNOSIS — E1140 Type 2 diabetes mellitus with diabetic neuropathy, unspecified: Secondary | ICD-10-CM | POA: Diagnosis not present

## 2020-03-15 DIAGNOSIS — I10 Essential (primary) hypertension: Secondary | ICD-10-CM | POA: Diagnosis not present

## 2020-03-15 DIAGNOSIS — I2510 Atherosclerotic heart disease of native coronary artery without angina pectoris: Secondary | ICD-10-CM | POA: Diagnosis not present

## 2020-03-15 DIAGNOSIS — E1122 Type 2 diabetes mellitus with diabetic chronic kidney disease: Secondary | ICD-10-CM | POA: Diagnosis not present

## 2020-03-15 DIAGNOSIS — E1151 Type 2 diabetes mellitus with diabetic peripheral angiopathy without gangrene: Secondary | ICD-10-CM | POA: Diagnosis not present

## 2020-03-15 DIAGNOSIS — I480 Paroxysmal atrial fibrillation: Secondary | ICD-10-CM | POA: Diagnosis not present

## 2020-03-15 DIAGNOSIS — I130 Hypertensive heart and chronic kidney disease with heart failure and stage 1 through stage 4 chronic kidney disease, or unspecified chronic kidney disease: Secondary | ICD-10-CM | POA: Diagnosis not present

## 2020-03-15 DIAGNOSIS — N189 Chronic kidney disease, unspecified: Secondary | ICD-10-CM | POA: Diagnosis not present

## 2020-03-16 ENCOUNTER — Ambulatory Visit (INDEPENDENT_AMBULATORY_CARE_PROVIDER_SITE_OTHER): Payer: Medicare HMO | Admitting: Ophthalmology

## 2020-03-16 ENCOUNTER — Other Ambulatory Visit: Payer: Self-pay

## 2020-03-16 ENCOUNTER — Encounter (INDEPENDENT_AMBULATORY_CARE_PROVIDER_SITE_OTHER): Payer: Self-pay | Admitting: Ophthalmology

## 2020-03-16 DIAGNOSIS — E113553 Type 2 diabetes mellitus with stable proliferative diabetic retinopathy, bilateral: Secondary | ICD-10-CM

## 2020-03-16 DIAGNOSIS — Z794 Long term (current) use of insulin: Secondary | ICD-10-CM

## 2020-03-16 NOTE — Progress Notes (Signed)
03/16/2020     CHIEF COMPLAINT Patient presents for Retina Follow Up (9 Month PDR f\u OU. OCT/Pt states vision has been stable. Denies new FOL and floaters./BGL: 88/A1C: 6.9)   HISTORY OF PRESENT ILLNESS: Xavier Caldwell is a 70 y.o. male who presents to the clinic today for:   HPI    Retina Follow Up    Patient presents with  Diabetic Retinopathy.  In both eyes.  Severity is moderate.  Duration of 9 months.  Since onset it is stable.  I, the attending physician,  performed the HPI with the patient and updated documentation appropriately. Additional comments: 9 Month PDR f\u OU. OCT Pt states vision has been stable. Denies new FOL and floaters. BGL: 88 A1C: 6.9       Last edited by Tilda Franco on 03/16/2020 10:32 AM. (History)      Referring physician: Redmond School, MD [address] [place],  [place] [address]  HISTORICAL INFORMATION:   Selected notes from the MEDICAL RECORD NUMBER    Lab Results  Component Value Date   HGBA1C 6.5 (A) 11/05/2019     CURRENT MEDICATIONS: Current Outpatient Medications (Ophthalmic Drugs)  Medication Sig  . timolol (TIMOPTIC) 0.5 % ophthalmic solution INSTILL 1 DROP INTO EACH EYE TWICE DAILY (Patient taking differently: Place 1 drop into both eyes 2 (two) times daily.)   No current facility-administered medications for this visit. (Ophthalmic Drugs)   Current Outpatient Medications (Other)  Medication Sig  . ALPRAZolam (XANAX) 0.5 MG tablet Take 0.5 mg by mouth 2 (two) times daily as needed for anxiety.  Marland Kitchen amLODipine (NORVASC) 10 MG tablet Take 1 tablet (10 mg total) by mouth daily.  Marland Kitchen apixaban (ELIQUIS) 5 MG TABS tablet Take 1 tablet (5 mg total) by mouth 2 (two) times daily.  Marland Kitchen atorvastatin (LIPITOR) 80 MG tablet Take 1 tablet (80 mg total) by mouth daily.  . B Complex-C (SUPER B COMPLEX PO) Take 1 tablet by mouth daily.   . carvedilol (COREG) 12.5 MG tablet Take 1 tablet (12.5 mg total) by mouth 2 (two) times daily.  .  DULoxetine (CYMBALTA) 60 MG capsule Take 60 mg by mouth daily with lunch.   . ergocalciferol (VITAMIN D2) 50000 units capsule Take 50,000 Units by mouth every Sunday.   . gabapentin (NEURONTIN) 300 MG capsule Take 300 mg by mouth 2 (two) times daily.   Marland Kitchen glucose blood (ONETOUCH ULTRA) test strip USE ONE TEST STRIP AS DIRECTED TWICE DAILY  . hydrALAZINE (APRESOLINE) 25 MG tablet Take 1 tablet (25 mg total) by mouth 3 (three) times daily.  Marland Kitchen HYDROcodone-acetaminophen (NORCO/VICODIN) 5-325 MG per tablet Take 1 tablet by mouth every 6 (six) hours as needed for moderate pain.  . Insulin Glargine (BASAGLAR KWIKPEN) 100 UNIT/ML Inject 30 Units into the skin at bedtime.  Marland Kitchen linaclotide (LINZESS) 145 MCG CAPS capsule Take 1 capsule (145 mcg total) by mouth daily before breakfast.  . nitroGLYCERIN (NITROSTAT) 0.4 MG SL tablet Place 1 tablet (0.4 mg total) under the tongue every 5 (five) minutes as needed. (Patient taking differently: Place 0.4 mg under the tongue every 5 (five) minutes as needed for chest pain.)  . rOPINIRole (REQUIP) 1 MG tablet Take 1 mg by mouth 4 (four) times daily.   Marland Kitchen UNIFINE PENTIPS 31G X 5 MM MISC USE TO INJECT INSULIN AT BEDTIME (Patient taking differently: at bedtime.)   No current facility-administered medications for this visit. (Other)      REVIEW OF SYSTEMS: ROS  Positive for: Endocrine   Last edited by Tilda Franco on 03/16/2020 10:32 AM. (History)       ALLERGIES No Known Allergies  PAST MEDICAL HISTORY Past Medical History:  Diagnosis Date  . Acute systolic (congestive) heart failure ([place])   . Anxiety   . Arthritis   . Atrial fibrillation ([place])   . Back pain   . Coronary artery disease   . Diabetes mellitus without complication ([place])   . High cholesterol   . History of gout   . Hypertension   . Myocardial infarction ([place]) 04/2018  . Neuropathy   . PAD (peripheral artery disease) ([place])   . Retinal detachment    One on the right and two on the  left  . Sleep apnea    Stop Bang score of 4  . STEMI (ST elevation myocardial infarction) ([place])    04/08/2018 PCI/DES RCA  . VT (ventricular tachycardia) ([place])    Past Surgical History:  Procedure Laterality Date  . APPENDECTOMY    . CARDIOVERSION N/A 04/13/2018   Procedure: CARDIOVERSION;  Surgeon: Skeet Latch, MD;  Location: [facility];  Service: Cardiovascular;  Laterality: N/A;  . CATARACT EXTRACTION W/PHACO Right 04/25/2014   Procedure: CATARACT EXTRACTION PHACO AND INTRAOCULAR LENS PLACEMENT ([place]);  Surgeon: Williams Che, MD;  Location: [facility];  Service: Ophthalmology;  Laterality: Right;  CDE:4.70  . CATARACT EXTRACTION W/PHACO Left 07/11/2014   Procedure: CATARACT EXTRACTION PHACO AND INTRAOCULAR LENS PLACEMENT LEFT EYE CDE=8.32;  Surgeon: Williams Che, MD;  Location: [facility];  Service: Ophthalmology;  Laterality: Left;  . COLONOSCOPY WITH PROPOFOL N/A 09/23/2016   [place]: 9 polyps removed, multiple tubular adenomas. recommended 3 yr surveillance  . COLONOSCOPY WITH PROPOFOL N/A 12/27/2019   ten sessile polyps, 4-9 mm in size in descending, hepatic flexure, and cecum, 11 mm polyp in sigmoid, three sessile polyps in rectum and mid rectum 2-3 mm in size. Tubular adenomas and hyperplastic. 3 year surveillance  . CORONARY/GRAFT ACUTE MI REVASCULARIZATION N/A 04/07/2018   Procedure: Coronary/Graft Acute MI Revascularization;  Surgeon: Burnell Blanks, MD;  Location: [facility];  Service: Cardiovascular;  Laterality: N/A;  . LEFT HEART CATH AND CORONARY ANGIOGRAPHY N/A 04/07/2018   Procedure: LEFT HEART CATH AND CORONARY ANGIOGRAPHY;  Surgeon: Burnell Blanks, MD;  Location: [facility];  Service: Cardiovascular;  Laterality: N/A;  . MELANOMA EXCISION  07/2019  . POLYPECTOMY  09/23/2016   Procedure: POLYPECTOMY;  Surgeon: Daneil Dolin, MD;  [facility];  Service: Endoscopy;;  colon  . POLYPECTOMY  12/27/2019   Procedure: POLYPECTOMY;   Surgeon: Daneil Dolin, MD;  [facility];  Service: Endoscopy;;  . RETINAL DETACHMENT SURGERY      FAMILY HISTORY Family History  Problem Relation Age of Onset  . Hypertension Father   . Colon cancer Neg Hx     SOCIAL HISTORY Social History   Tobacco Use  . Smoking status: Former Smoker    Packs/day: 1.50    Years: 30.00    Pack years: 45.00    Types: Cigarettes    Quit date: 07/05/2012    Years since quitting: 7.7  . Smokeless tobacco: Never Used  Vaping Use  . Vaping Use: Never used  Substance Use Topics  . Alcohol use: No  . Drug use: No         OPHTHALMIC EXAM: Base Eye Exam    Visual Acuity (Snellen - Linear)  Right Left   Dist cc 20/50 -2 20/70   Dist ph cc 20/50 +2 NI   Correction: Glasses       Tonometry (Tonopen, 10:37 AM)      Right Left   Pressure 18 19       Pupils      Pupils Dark Light Shape React APD   Right PERRL 5 4 Round Brisk None   Left PERRL 5 4 Round Sluggish None       Visual Fields (Counting fingers)      Left Right    Full Full       Neuro/Psych    Oriented x3: Yes   Mood/Affect: Normal       Dilation    Both eyes: 1.0% Mydriacyl, 2.5% Phenylephrine @ 10:37 AM        Slit Lamp and Fundus Exam    External Exam      Right Left   External Normal Normal       Slit Lamp Exam      Right Left   Lids/Lashes Normal Normal   Conjunctiva/Sclera White and quiet White and quiet   Cornea Clear Clear   Anterior Chamber Deep and quiet Deep and quiet   Iris Round and reactive Round and reactive   Lens Posterior chamber intraocular lens Posterior chamber intraocular lens   Anterior Vitreous Normal Normal       Fundus Exam      Right Left   Posterior Vitreous Vitrectomized Posterior vitreous detachment   Disc Normal Normal   C/D Ratio 0.7 0.75   Macula no macular thickening, Microaneurysms, no clinically significant macular edema, Retinal pigment epithelial mottling no macular thickening, Microaneurysms,  no clinically significant macular edema, Retinal pigment epithelial mottling   Vessels Proliferative diabetic retinopathy quiescent Proliferative diabetic retinopathy quiescent   Periphery Good PRP, a attached 360 Good PRP, attached 360, watch temporally          IMAGING AND PROCEDURES  Imaging and Procedures for 03/16/20  OCT, Retina - OU - Both Eyes       Right Eye Quality was good. Scan locations included subfoveal. Central Foveal Thickness: 328. Findings include abnormal foveal contour.   Left Eye Quality was good. Scan locations included subfoveal. Central Foveal Thickness: 288. Findings include normal foveal contour.   Notes No active maculopathy in either eye.                ASSESSMENT/PLAN:  Controlled type 2 diabetes mellitus with stable proliferative retinopathy of both eyes, with long-term current use of insulin (HCC) The nature of regressed proliferative diabetic retinopathy was discussed with the patient. The patient was advised to maintain good glucose, blood pressure, monitor kidney function and serum lipid control as advised by personal physician. Rare risk for reactivation of progression exist with untreated severe anemia, untreated renal failure, untreated heart failure, and smoking. Complete avoidance of smoking was recommended. The chance of recurrent proliferative diabetic retinopathy was discussed as well as the chance of vitreous hemorrhage for which further treatments may be necessary.   Explained to the patient that the quiescent  proliferative diabetic retinopathy disease is unlikely to ever worsen.  Worsening factors would include however severe anemia, hypertension out-of-control or impending renal failure.      ICD-10-CM   1. Controlled type 2 diabetes mellitus with stable proliferative retinopathy of both eyes, with long-term current use of insulin (HCC)  E11.3553 OCT, Retina - OU - Both Eyes   Z79.4  1.  Patient continues to exhibit  excellent blood sugar control and monitoring. 2.  Funduscopically each eye with quiescent proliferative diabetic retinopathy, good PRP peripherally,  3.  Left eye must be watched temporally in the posterior pole as there are some regions of dot blot hemorrhages yet no active neovascularization we will continue to monitor   Ophthalmic Meds Ordered this visit:  No orders of the defined types were placed in this encounter.      Return in about 9 months (around 12/14/2020) for COLOR FP, dilate, OCT.  There are no Patient Instructions on file for this visit.   Explained the diagnoses, plan, and follow up with the patient and they expressed understanding.  Patient expressed understanding of the importance of proper follow up care.   Clent Demark Tiarrah Saville M.D. Diseases & Surgery of the Retina and Vitreous [facility] [place] 03/16/20     Abbreviations: M myopia (nearsighted); A astigmatism; H hyperopia (farsighted); P presbyopia; Mrx spectacle prescription;  CTL contact lenses; OD right eye; OS left eye; OU both eyes  XT exotropia; ET esotropia; PEK punctate epithelial keratitis; PEE punctate epithelial erosions; DES dry eye syndrome; MGD meibomian gland dysfunction; ATs artificial tears; PFAT's preservative free artificial tears; [place] nuclear sclerotic cataract; PSC posterior subcapsular cataract; ERM epi-retinal membrane; PVD posterior vitreous detachment; RD retinal detachment; DM diabetes mellitus; DR diabetic retinopathy; NPDR non-proliferative diabetic retinopathy; PDR proliferative diabetic retinopathy; CSME clinically significant macular edema; DME diabetic macular edema; dbh dot blot hemorrhages; CWS cotton wool spot; POAG primary open angle glaucoma; C/D cup-to-disc ratio; HVF humphrey visual field; GVF goldmann visual field; OCT optical coherence tomography; IOP intraocular pressure; BRVO Branch retinal vein occlusion; CRVO central retinal vein occlusion; CRAO central retinal artery  occlusion; BRAO branch retinal artery occlusion; RT retinal tear; SB scleral buckle; PPV pars plana vitrectomy; VH Vitreous hemorrhage; PRP panretinal laser photocoagulation; IVK intravitreal kenalog; VMT vitreomacular traction; MH Macular hole;  NVD neovascularization of the disc; NVE neovascularization elsewhere; AREDS age related eye disease study; ARMD age related macular degeneration; POAG primary open angle glaucoma; EBMD epithelial/anterior basement membrane dystrophy; ACIOL anterior chamber intraocular lens; IOL intraocular lens; PCIOL posterior chamber intraocular lens; Phaco/IOL phacoemulsification with intraocular lens placement; [place] photorefractive keratectomy; LASIK laser assisted in situ keratomileusis; HTN hypertension; DM diabetes mellitus; COPD chronic obstructive pulmonary disease

## 2020-03-16 NOTE — Assessment & Plan Note (Signed)

## 2020-03-17 DIAGNOSIS — Z6830 Body mass index (BMI) 30.0-30.9, adult: Secondary | ICD-10-CM | POA: Diagnosis not present

## 2020-03-17 DIAGNOSIS — E6609 Other obesity due to excess calories: Secondary | ICD-10-CM | POA: Diagnosis not present

## 2020-03-17 DIAGNOSIS — E119 Type 2 diabetes mellitus without complications: Secondary | ICD-10-CM | POA: Diagnosis not present

## 2020-03-17 DIAGNOSIS — E875 Hyperkalemia: Secondary | ICD-10-CM | POA: Diagnosis not present

## 2020-03-17 DIAGNOSIS — I4891 Unspecified atrial fibrillation: Secondary | ICD-10-CM | POA: Diagnosis not present

## 2020-03-17 DIAGNOSIS — Z79899 Other long term (current) drug therapy: Secondary | ICD-10-CM | POA: Diagnosis not present

## 2020-03-21 ENCOUNTER — Encounter: Payer: Self-pay | Admitting: Vascular Surgery

## 2020-03-21 ENCOUNTER — Ambulatory Visit (HOSPITAL_COMMUNITY)
Admission: RE | Admit: 2020-03-21 | Discharge: 2020-03-21 | Disposition: A | Discharge status: Home / Self Care | Payer: Medicare HMO | Source: Ambulatory Visit | Attending: Vascular Surgery | Admitting: Vascular Surgery

## 2020-03-21 ENCOUNTER — Ambulatory Visit (INDEPENDENT_AMBULATORY_CARE_PROVIDER_SITE_OTHER)
Admission: RE | Admit: 2020-03-21 | Discharge: 2020-03-21 | Disposition: A | Discharge status: Home / Self Care | Payer: Medicare HMO | Source: Ambulatory Visit | Attending: Vascular Surgery | Admitting: Vascular Surgery

## 2020-03-21 ENCOUNTER — Other Ambulatory Visit: Payer: Self-pay

## 2020-03-21 ENCOUNTER — Ambulatory Visit: Payer: Medicare HMO | Admitting: Vascular Surgery

## 2020-03-21 VITALS — BP 165/80 | HR 68 | Temp 98.0°F | Resp 16 | Ht 71.0 in | Wt 211.0 lb

## 2020-03-21 DIAGNOSIS — I6523 Occlusion and stenosis of bilateral carotid arteries: Secondary | ICD-10-CM

## 2020-03-21 DIAGNOSIS — I739 Peripheral vascular disease, unspecified: Secondary | ICD-10-CM

## 2020-03-21 NOTE — Progress Notes (Signed)
Patient name: Xavier Caldwell MRN: [id number] DOB: [birth year] Sex: male  REASON FOR VISIT: 6 month follow-up for surveillance of carotid artery disease and PVD  HPI: Xavier Caldwell is a 70 y.o. male with history of coronary disease status post NSTEMI complicated by V. tach arrest, A. fib on Eliquis, asymptomatic carotid stenosis, HTN, HLD that presents for 6 month follow-up for surveillance of his PVD and carotid artery disease.    As it related to his carotid arteries, patient was being followed by his cardiologist Dr. Harl Bowie who noted a carotid bruit and subsequently got carotid duplex revealing greater than 70% stenosis bilaterally.  Repeat duplex is in our office in February last year showed 60 to 79% stenosis on the right and 60 to 79% stenosis on the left.  He has remained asymptomatic and we have been following him.    Unfortunately he was hospitalized at [facility] earlier this month.  Sounds like he had bilateral lower extremity weakness that then progressed to the left upper and lower extremity weakness.  He was evaluated by neurology.  MRI did not show any acute event.  He did get CTAs of the neck that showed over 70% calcified stenosis bilaterally.  He denies any neck surgery or neck radiation.  His lower legs are doing fine from a PVD standpoint with no complaints.   Past Medical History:  Diagnosis Date  . Acute systolic (congestive) heart failure ([place])   . Anxiety   . Arthritis   . Atrial fibrillation ([place])   . Back pain   . Coronary artery disease   . Diabetes mellitus without complication ([place])   . High cholesterol   . History of gout   . Hypertension   . Myocardial infarction ([place]) 04/2018  . Neuropathy   . PAD (peripheral artery disease) ([place])   . 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 Sleep apnea    Stop Bang score of 4  . STEMI (ST elevation myocardial infarction) ([place])    04/08/2018 PCI/DES RCA  . VT (ventricular tachycardia) ([place])     Past  Surgical History:  Procedure Laterality Date  . APPENDECTOMY    . CARDIOVERSION N/A 04/13/2018   Procedure: CARDIOVERSION;  Surgeon: Skeet Latch, MD;  Location: [place];  Service: Cardiovascular;  Laterality: N/A;  . CATARACT EXTRACTION W/PHACO Right 04/25/2014   Procedure: CATARACT EXTRACTION PHACO AND INTRAOCULAR LENS PLACEMENT ([place]);  Surgeon: Williams Che, MD;  Location: [facility];  Service: Ophthalmology;  Laterality: Right;  CDE:4.70  . CATARACT EXTRACTION W/PHACO Left 07/11/2014   Procedure: CATARACT EXTRACTION PHACO AND INTRAOCULAR LENS PLACEMENT LEFT EYE CDE=8.32;  Surgeon: Williams Che, MD;  Location: [facility];  Service: Ophthalmology;  Laterality: Left;  . COLONOSCOPY WITH PROPOFOL N/A 09/23/2016   [place]: 9 polyps removed, multiple tubular adenomas. recommended 3 yr surveillance  . COLONOSCOPY WITH PROPOFOL N/A 12/27/2019   ten sessile polyps, 4-9 mm in size in descending, hepatic flexure, and cecum, 11 mm polyp in sigmoid, three sessile polyps in rectum and mid rectum 2-3 mm in size. Tubular adenomas and hyperplastic. 3 year surveillance  . CORONARY/GRAFT ACUTE MI REVASCULARIZATION N/A 04/07/2018   Procedure: Coronary/Graft Acute MI Revascularization;  Surgeon: Burnell Blanks, MD;  Location: [place] [facility];  Service: Cardiovascular;  Laterality: N/A;  . LEFT HEART CATH AND CORONARY ANGIOGRAPHY N/A 04/07/2018   Procedure: LEFT HEART CATH AND CORONARY ANGIOGRAPHY;  Surgeon: Burnell Blanks, MD;  Location: [place] [facility];  Service: Cardiovascular;  Laterality: N/A;  . MELANOMA EXCISION  07/2019  . POLYPECTOMY  09/23/2016   Procedure: POLYPECTOMY;  Surgeon: Daneil Dolin, MD;  [facility];  Service: Endoscopy;;  colon  . POLYPECTOMY  12/27/2019   Procedure: POLYPECTOMY;  Surgeon: Daneil Dolin, MD;  [facility];  Service: Endoscopy;;  . RETINAL DETACHMENT SURGERY      Family History  Problem Relation Age of Onset  .  Hypertension Father   . Colon cancer Neg Hx     SOCIAL HISTORY: Social History   Tobacco Use  . Smoking status: Former Smoker    Packs/day: 1.50    Years: 30.00    Pack years: 45.00    Types: Cigarettes    Quit date: 07/05/2012    Years since quitting: 7.7  . Smokeless tobacco: Never Used  Substance Use Topics  . Alcohol use: No    No Known Allergies  Current Outpatient Medications  Medication Sig Dispense Refill  . ALPRAZolam (XANAX) 0.5 MG tablet Take 0.5 mg by mouth 2 (two) times daily as needed for anxiety.    Marland Kitchen amLODipine (NORVASC) 10 MG tablet Take 1 tablet (10 mg total) by mouth daily. 30 tablet 1  . apixaban (ELIQUIS) 5 MG TABS tablet Take 1 tablet (5 mg total) by mouth 2 (two) times daily. 60 tablet 6  . B Complex-C (SUPER B COMPLEX PO) Take 1 tablet by mouth daily.     . carvedilol (COREG) 12.5 MG tablet Take 1 tablet (12.5 mg total) by mouth 2 (two) times daily. 180 tablet 1  . DULoxetine (CYMBALTA) 60 MG capsule Take 60 mg by mouth daily with lunch.     . ergocalciferol (VITAMIN D2) 50000 units capsule Take 50,000 Units by mouth every Sunday.     . gabapentin (NEURONTIN) 300 MG capsule Take 300 mg by mouth 2 (two) times daily.     Marland Kitchen glucose blood (ONETOUCH ULTRA) test strip USE ONE TEST STRIP AS DIRECTED TWICE DAILY 100 each 0  . hydrALAZINE (APRESOLINE) 25 MG tablet Take 1 tablet (25 mg total) by mouth 3 (three) times daily. 90 tablet 0  . HYDROcodone-acetaminophen (NORCO/VICODIN) 5-325 MG per tablet Take 1 tablet by mouth every 6 (six) hours as needed for moderate pain.    . Insulin Glargine (BASAGLAR KWIKPEN) 100 UNIT/ML Inject 30 Units into the skin at bedtime. 30 mL 3  . linaclotide (LINZESS) 145 MCG CAPS capsule Take 1 capsule (145 mcg total) by mouth daily before breakfast. 30 capsule 3  . nitroGLYCERIN (NITROSTAT) 0.4 MG SL tablet Place 1 tablet (0.4 mg total) under the tongue every 5 (five) minutes as needed. (Patient taking differently: Place 0.4 mg under the  tongue every 5 (five) minutes as needed for chest pain.) 25 tablet 2  . rOPINIRole (REQUIP) 1 MG tablet Take 1 mg by mouth 4 (four) times daily.     . timolol (TIMOPTIC) 0.5 % ophthalmic solution INSTILL 1 DROP INTO EACH EYE TWICE DAILY (Patient taking differently: Place 1 drop into both eyes 2 (two) times daily.) 10 mL 10  . UNIFINE PENTIPS 31G X 5 MM MISC USE TO INJECT INSULIN AT BEDTIME (Patient taking differently: at bedtime.) 100 each 5  . atorvastatin (LIPITOR) 80 MG tablet Take 1 tablet (80 mg total) by mouth daily. 90 tablet 3   No current facility-administered medications for this visit.    REVIEW OF SYSTEMS:  [X]  denotes positive finding, [ ]   denotes negative finding Cardiac  Comments:  Chest pain or chest pressure:    Shortness of breath upon exertion:    Short of breath when lying flat:    Irregular heart rhythm:        Vascular    Pain in calf, thigh, or hip brought on by ambulation:    Pain in feet at night that wakes you up from your sleep:     Blood clot in your veins:    Leg swelling:         Pulmonary    Oxygen at home:    Productive cough:     Wheezing:         Neurologic    Sudden weakness in arms or legs:     Sudden numbness in arms or legs:     Sudden onset of difficulty speaking or slurred speech:    Temporary loss of vision in one eye:     Problems with dizziness:         Gastrointestinal    Blood in stool:     Vomited blood:         Genitourinary    Burning when urinating:     Blood in urine:        Psychiatric    Major depression:         Hematologic    Bleeding problems:    Problems with blood clotting too easily:        Skin    Rashes or ulcers:        Constitutional    Fever or chills:      PHYSICAL EXAM: Vitals:   03/21/20 1215 03/21/20 1220  BP: (!) 162/79 (!) 165/80  Pulse: 68 68  Resp: 16   Temp: 98 F (36.7 C)   TempSrc: Temporal   SpO2: 96%   Weight: 211 lb (95.7 kg)   Height: 5\' 11"  (1.803 m)     GENERAL: The  patient is a well-nourished male, in no acute distress. The vital signs are documented above. CARDIAC: There is a regular rate and rhythm.  VASCULAR:  No wounds, feet warm PULMONARY: No respiratory distress. ABDOMEN: Soft and non-tender. MUSCULOSKELETAL: There are no major deformities or cyanosis. NEUROLOGIC: No focal weakness or paresthesias are detected.  CN II-XII grossly intact.  DATA:   ABIs today are 0.71 on the right monophasic and 0.99 on the left biphasic  Duplex today suggest high-grade bilateral ICA stenosis greater than 80%  Reviewed recent CTA neck 03/09/20 and appears to have high grade calcified stenosis at right carotid bifurcation with 70%+ stenosis on left as well  Assessment/Plan:  70 year old male who presents for 76-month interval follow-up of his carotid artery disease and peripheral vascular disease.  Unfortunately his carotid artery disease has had significant progression now consistent with greater than 80% stenosis bilaterally by duplex criteria today.  The right ICA has a high-grade stenosis based on velocity criteria.  I am concerned about this lesion given he does describe an episode earlier this month where he had bilateral lower extremity weakness and ultimately left upper arm and leg weakness and went to [facility].  MRI was negative for stroke but I wonder if this could have been a TIA given the focal nature of his symptoms.  I have recommended right carotid endarterectomy for revascularization and stroke risk reduction.  He will need to hold his Eliquis 2 days prior to surgery.  I have asked that he start a 81 mg aspirin  at least around the timeline of surgery.  I will send his cardiologist Dr. Harl Bowie a note to let him know, he denies any chest pain or dyspnea at this time.  Plan right carotid endarterectomy next Wednesday and risks and benefits discussed.   Marty Heck, MD [facility] Office: [phone]

## 2020-03-23 ENCOUNTER — Other Ambulatory Visit: Payer: Self-pay

## 2020-03-23 ENCOUNTER — Encounter (HOSPITAL_COMMUNITY)
Admission: RE | Admit: 2020-03-23 | Discharge: 2020-03-23 | Disposition: A | Discharge status: Home / Self Care | Payer: Medicare HMO | Source: Ambulatory Visit | Attending: Vascular Surgery | Admitting: Vascular Surgery

## 2020-03-23 ENCOUNTER — Encounter (HOSPITAL_COMMUNITY): Payer: Self-pay

## 2020-03-23 DIAGNOSIS — Z01812 Encounter for preprocedural laboratory examination: Secondary | ICD-10-CM | POA: Diagnosis present

## 2020-03-23 HISTORY — DX: Chronic kidney disease, unspecified: N18.9

## 2020-03-23 LAB — CBC
HCT: 31.2 % — ABNORMAL LOW (ref 39.0–52.0)
Hemoglobin: 10.5 g/dL — ABNORMAL LOW (ref 13.0–17.0)
MCH: 29.7 pg (ref 26.0–34.0)
MCHC: 33.7 g/dL (ref 30.0–36.0)
MCV: 88.4 fL (ref 80.0–100.0)
Platelets: 244 10*3/uL (ref 150–400)
RBC: 3.53 MIL/uL — ABNORMAL LOW (ref 4.22–5.81)
RDW: 13.2 % (ref 11.5–15.5)
WBC: 10.7 10*3/uL — ABNORMAL HIGH (ref 4.0–10.5)
nRBC: 0 % (ref 0.0–0.2)

## 2020-03-23 LAB — URINALYSIS, ROUTINE W REFLEX MICROSCOPIC
Bilirubin Urine: NEGATIVE
Glucose, UA: NEGATIVE mg/dL
Hgb urine dipstick: NEGATIVE
Ketones, ur: NEGATIVE mg/dL
Leukocytes,Ua: NEGATIVE
Nitrite: NEGATIVE
Protein, ur: NEGATIVE mg/dL
Specific Gravity, Urine: 1.008 (ref 1.005–1.030)
pH: 5 (ref 5.0–8.0)

## 2020-03-23 LAB — TYPE AND SCREEN
ABO/RH(D): B NEG
Antibody Screen: NEGATIVE

## 2020-03-23 LAB — SURGICAL PCR SCREEN
MRSA, PCR: NEGATIVE
Staphylococcus aureus: NEGATIVE

## 2020-03-23 LAB — COMPREHENSIVE METABOLIC PANEL
ALT: 17 U/L (ref 0–44)
AST: 19 U/L (ref 15–41)
Albumin: 3.6 g/dL (ref 3.5–5.0)
Alkaline Phosphatase: 81 U/L (ref 38–126)
Anion gap: 7 (ref 5–15)
BUN: 28 mg/dL — ABNORMAL HIGH (ref 8–23)
CO2: 25 mmol/L (ref 22–32)
Calcium: 9.2 mg/dL (ref 8.9–10.3)
Chloride: 104 mmol/L (ref 98–111)
Creatinine, Ser: 1.52 mg/dL — ABNORMAL HIGH (ref 0.61–1.24)
GFR, Estimated: 50 mL/min — ABNORMAL LOW (ref >60)
Glucose, Bld: 127 mg/dL — ABNORMAL HIGH (ref 70–99)
Potassium: 4.9 mmol/L (ref 3.5–5.1)
Sodium: 136 mmol/L (ref 135–145)
Total Bilirubin: 1 mg/dL (ref 0.3–1.2)
Total Protein: 6.7 g/dL (ref 6.5–8.1)

## 2020-03-23 LAB — PROTIME-INR
INR: 1.4 — ABNORMAL HIGH (ref 0.8–1.2)
Prothrombin Time: 16.8 seconds — ABNORMAL HIGH (ref 11.4–15.2)

## 2020-03-23 LAB — GLUCOSE, CAPILLARY: Glucose-Capillary: 119 mg/dL — ABNORMAL HIGH (ref 70–99)

## 2020-03-23 LAB — APTT: aPTT: 37 seconds — ABNORMAL HIGH (ref 24–36)

## 2020-03-23 NOTE — Progress Notes (Signed)
Your procedure is scheduled on Wednesday Mar 29, 2020.  Report to [facility] Main Entrance "A" at 05:30 A.M., and check in at the Admitting office.  Call this number if you have problems the morning of surgery: [phone]  Call [phone] if you have any questions prior to your surgery date Monday-Friday 8am-4pm   Remember: Do not eat or drink after midnight the night before your surgery  Take these medicines the morning of surgery with A SIP OF WATER: amLODipine (NORVASC)  atorvastatin (LIPITOR) carvedilol (COREG)  gabapentin (NEURONTIN) hydrALAZINE (APRESOLINE)  rOPINIRole (REQUIP) timolol (TIMOPTIC) - eye drops   If needed: ALPRAZolam Duanne Moron)  HYDROcodone-acetaminophen (NORCO/VICODIN) nitroGLYCERIN (NITROSTAT)  Follow your surgeon's instructions on when to stop apixaban (ELIQUIS).  If no instructions were given by your surgeon then you will need to call the office to get those instructions.    As of today, STOP taking any Aspirin (unless otherwise instructed by your surgeon), Aleve, Naproxen, Ibuprofen, Motrin, Advil, Goody's, BC's, all herbal medications, fish oil, and all vitamins.  WHAT DO I DO ABOUT MY DIABETES MEDICATION?  Insulin Glargine [facility]) - Day before surgery : take 15 units at bedtime - Morning of surgery : none  . DO NOT take oral diabetic medication the morning of surgery.  . If your CBG is greater than 220 mg/dL, you may take  of your sliding scale (correction) dose of insulin.   HOW TO MANAGE YOUR DIABETES BEFORE AND AFTER SURGERY  Why is it important to control my blood sugar before and after surgery? . Improving blood sugar levels before and after surgery helps healing and can limit problems. . A way of improving blood sugar control is eating a healthy diet by: o  Eating less sugar and carbohydrates o  Increasing activity/exercise o  Talking with your doctor about reaching your blood sugar goals . High blood sugars (greater  than 180 mg/dL) can raise your risk of infections and slow your recovery, so you will need to focus on controlling your diabetes during the weeks before surgery. . Make sure that the doctor who takes care of your diabetes knows about your planned surgery including the date and location.  How do I manage my blood sugar before surgery? . Check your blood sugar at least 4 times a day, starting 2 days before surgery, to make sure that the level is not too high or low. . Check your blood sugar the morning of your surgery when you wake up and every 2 hours until you get to the Short Stay unit. o If your blood sugar is less than 70 mg/dL, you will need to treat for low blood sugar: - Do not take insulin. - Treat a low blood sugar (less than 70 mg/dL) with  cup of clear juice (cranberry or apple), 4 glucose tablets, OR glucose gel. - Recheck blood sugar in 15 minutes after treatment (to make sure it is greater than 70 mg/dL). If your blood sugar is not greater than 70 mg/dL on recheck, call [phone] for further instructions. . Report your blood sugar to the short stay nurse when you get to Short Stay.  . If you are admitted to the hospital after surgery: o Your blood sugar will be checked by the staff and you will probably be given insulin after surgery (instead of oral diabetes medicines) to make sure you have good blood sugar levels. o The goal for blood sugar control after surgery is 80-180 mg/dL.    The Morning  of Surgery  Do not wear jewelry  Do not wear lotions, powders, colognes, or deodorant Men may shave face and neck.  Do not bring valuables to the hospital.  [facility] is not responsible for any belongings or valuables.  If you are a smoker, DO NOT Smoke 24 hours prior to surgery  If you wear a CPAP at night please bring your mask the morning of surgery   Remember that you must have someone to transport you home after your surgery, and remain with you for 24 hours if you are  discharged the same day.   Please bring cases for contacts, glasses, hearing aids, dentures or bridgework because it cannot be worn into surgery.    Leave your suitcase in the car.  After surgery it may be brought to your room.  For patients admitted to the hospital, discharge time will be determined by your treatment team.  Patients discharged the day of surgery will not be allowed to drive home.    Special instructions:   [place]- Preparing For Surgery  Before surgery, you can play an important role. Because skin is not sterile, your skin needs to be as free of germs as possible. You can reduce the number of germs on your skin by washing with CHG (chlorahexidine gluconate) Soap before surgery.  CHG is an antiseptic cleaner which kills germs and bonds with the skin to continue killing germs even after washing.    Oral Hygiene is also important to reduce your risk of infection.  Remember - BRUSH YOUR TEETH THE MORNING OF SURGERY WITH YOUR REGULAR TOOTHPASTE  Please do not use if you have an allergy to CHG or antibacterial soaps. If your skin becomes reddened/irritated stop using the CHG.  Do not shave (including legs and underarms) for at least 48 hours prior to first CHG shower. It is OK to shave your face.  Please follow these instructions carefully.   1. Shower the NIGHT BEFORE SURGERY and the MORNING OF SURGERY with CHG Soap.   2. If you chose to wash your hair and body, wash as usual with your normal shampoo and body-wash/soap.  3. Rinse your hair and body thoroughly to remove the shampoo and soap.  4. Apply CHG directly to the skin (ONLY FROM THE NECK DOWN) and wash gently with a scrungie or a clean washcloth.   5. Do not use on open wounds or open sores. Avoid contact with your eyes, ears, mouth and genitals (private parts). Wash Face and genitals (private parts)  with your normal soap.   6. Wash thoroughly, paying special attention to the area where your surgery will be  performed.  7. Thoroughly rinse your body with warm water from the neck down.  8. DO NOT shower/wash with your normal soap after using and rinsing off the CHG Soap.  9. Pat yourself dry with a CLEAN TOWEL.  10. Wear CLEAN PAJAMAS to bed the night before surgery  11. Place CLEAN SHEETS on your bed the night of your first shower and DO NOT SLEEP WITH PETS.  12. Wear comfortable clothes the morning of surgery.     Day of Surgery:  Please shower the morning of surgery with the CHG soap Do not apply any deodorants/lotions. Please wear clean clothes to the hospital/surgery center.   Remember to brush your teeth WITH YOUR REGULAR TOOTHPASTE.   Please read over the following fact sheets that you were given.

## 2020-03-23 NOTE — Progress Notes (Signed)
PCP Gerarda Fraction, MD Cardiologist - Harl Bowie, MD  Chest x-ray -  EKG - 03/10/20 Stress Test -  ECHO - 04/08/18 Cardiac Cath - 04/07/18  Sleep Study - yes - per pt he does not wear CPAP or O2 at home.   Fasting Blood Sugar - AM: 70-120, PM:160-190 Checks Blood Sugar 2 times a day  Blood Thinner Instructions: per pt, wife has all instructions by Dr. Carlis Abbott, they will call office to make sure about Eliquis instructions   - Dr. Anell Barr office messaged, per Allyne Gee, RN - stop eliquis 3 days prior to surgery Aspirin Instructions:   COVID TEST- 03/28/20   Anesthesia review: cardiac hx - recent hospitalization at [facility] d/t stroke like symptoms.  Patient denies shortness of breath, fever, cough and chest pain at PAT appointment   All instructions explained to the patient, with a verbal understanding of the material. Patient agrees to go over the instructions while at home for a better understanding. Patient also instructed to self quarantine after being tested for COVID-19. The opportunity to ask questions was provided.

## 2020-03-24 NOTE — Progress Notes (Signed)
Anesthesia Chart Review:  Case: [id number] Date/Time: 03/29/20 0715   Procedure: RIGHT CAROTID ARTERY ENDARTERECTOMY (Right )   Anesthesia type: General   Pre-op diagnosis: Carotid Artery Stenosis   Location: [facility] OR ROOM 12 / [place] OR   Surgeons: Marty Heck, MD      DISCUSSION: Patient is a 70 year old male scheduled for the above procedure. He has bilateral 80-99% ICA stenosis and recent admission for leg weakness (see below).  History includes former smoker (quit 07/05/12), CAD (inferior STEMI 04/07/18, s/p aspiration thrombectomy and DES pRCA, mild-moderate CX and LAD disease; TVP placed for bradycardia and required successful defibrillation for VT while in cath lab 04/07/18), afib/PAF (s/p DCCV 04/10/60), systolic CHF, HTN, hypercholesterolemia, DM2, retinal detachment (bilateral), PAD, CKD, OSA (does not wear CPAP), gout, neuropathy, anxiety, back pain, melanoma (s/p excision 07/2019).  - Admission 03/09/20-03/10/20 for acute bilateral leg and LUE weakness, SOB, fall without LOC. He drove himself to the [facility] ED. CODE STROKE activated. Head CT negative for acute stroke, Neck CTA showed ~ 70% BICA stenosis. Telemedicine Neurology consulted. MRI of brain and c-spine ordered. MRI brain showed no acute abnormality with possible mild chronic microvascular ischemic changes. MRI c-spine showed mild spinal canal stenosis at C4-7, severe bilateral neural foraminal canal stenosis at C3-5/6-7, marrow edema in the articular processes of C4-5 facet joint, enlarged thyroid gland with non-urgent US recommended. Labs were significant for hyperkalemia with peak K 8.0 with Creatinine ~ 2.0 which was stable when compared with 02/29/20 Creatinine (although elevated Creatinine new since 03/2019). Hospitalist admitted for hyperkalemia felt likely related to ACEi. He was treated with calcium gluconate, IV insulin, D50, Kayexalate, IVF, Lasix. Lisinopril was discontinued. K 4.5-5.0 with Creatinine 1.77 by discharge. Strength improved.  Per Discharge summary, "Heart rate currently stable and is anticoagulated with apixaban."  He had routine surveillance PAD and carotid stenosis follow-up with Dr. Carlis Abbott on 03/21/20. US showed significant progression of ICA stenosis to > 80% bilaterally. Given patient's recent symptoms of leg and LUE weakness with admission, Dr. Carlis Abbott questioned if those symptoms were from a TIA related to his high grade RICA stenosis. Dr. Carlis Abbott, "recommended right carotid endarterectomy for revascularization and stroke risk reduction.  He will need to hold his Eliquis 2 days prior to surgery.  I have asked that he start a 81 mg aspirin at least around the timeline of surgery.  I will send his cardiologist Dr. Harl Bowie a note to let him know, he denies any chest pain or dyspnea at this time."  Last cardiology visit 01/04/20 with Dr. Harl Bowie. No CAD symptoms. Coreg increased for HTN. Continue CAD and PAF medications. Six month follow-up recommended.   Preoperative COVID-19 test is scheduled for 03/28/20. Reviewed history and 03/09/20 EKGs with anesthesiologist Tamela Gammon, MD. Anesthesia team will evaluate on the day of surgery.     VS: BP (!) 159/71   Pulse 71   Temp 36.6 C (Oral)   Resp 17   Ht 5\' 11"  (1.803 m)   Wt 98.1 kg   SpO2 99%   BMI 30.17 kg/m    PROVIDERS: [employer], MD  Carlyle Dolly, MD is cardiologist Monica Martinez, MD is vascular surgeon Rourk, R. Legrand Como, MD is GI Loni Beckwith, MD is endocrinologist      LABS: Preoperative labs noted. INR 1.4, PTT 37. He was on Eliquis at time of lab draw, to hold two days prior to surgery. Cr 1.52 which is improved when compared to labs since 02/2020. A1c 6.5% 11/05/19  but according to 03/07/20 endocrinology note by Rayetta Pigg, NP, "His previsit A1c was 6.9% on 02/29/20 at his PCP office, increasing slightly from previous visit of 6.5%."  (all labs ordered are listed, but only abnormal results are displayed)  Labs Reviewed  CBC -  Abnormal; Notable for the following components:      Result Value   WBC 10.7 (*)    RBC 3.53 (*)    Hemoglobin 10.5 (*)    HCT 31.2 (*)    All other components within normal limits  COMPREHENSIVE METABOLIC PANEL - Abnormal; Notable for the following components:   Glucose, Bld 127 (*)    BUN 28 (*)    Creatinine, Ser 1.52 (*)    GFR, Estimated 50 (*)    All other components within normal limits  PROTIME-INR - Abnormal; Notable for the following components:   Prothrombin Time 16.8 (*)    INR 1.4 (*)    All other components within normal limits  APTT - Abnormal; Notable for the following components:   aPTT 37 (*)    All other components within normal limits  GLUCOSE, CAPILLARY - Abnormal; Notable for the following components:   Glucose-Capillary 119 (*)    All other components within normal limits  SURGICAL PCR SCREEN  URINALYSIS, ROUTINE W REFLEX MICROSCOPIC  TYPE AND SCREEN     IMAGES: MRI Brain 03/09/20: IMPRESSION: 1. No acute intracranial abnormality. 2. Few foci of T2 hyperintensity within the white matter of the cerebral hemispheres, nonspecific but may represent mild chronic microvascular ischemic changes.  MRI c-spine 03/09/20: IMPRESSION: 1. Multilevel degenerative changes of the cervical spine as described above, with mild spinal canal stenosis at C4-5, C5-6 and C6-7. 2. Severe bilateral neural foraminal narrowing at C3-4, C4-5, and C6-7. 3. Marrow edema in the articular processes of the C4-5 facet joint. 4. Enlarged thyroid gland with multiple T2 hyperintense nodules, the largest in the right lobe measuring at least 2.7 cm. Correlation with non urgent ultrasound is recommended.  CTA head/neck 03/09/20: IMPRESSION: 1. 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 73% diameter stenosis proximal left internal carotid artery due to heavily calcified atherosclerotic plaque 3. Mild stenosis proximal  and distal basilar bilaterally. 4. Mild stenosis in the cavernous carotid bilaterally 5. Negative for intracranial large vessel occlusion 6. Thyroid goiter with multiple nodules. Largest nodule on the right 3 cm. Thyroid ultrasound recommended. (Ref: J Am Coll Radiol. 2015 Feb;12(2): 143-50).   EKG:  EKG 03/09/20 1337 Sinus rhythm vs afib Left bundle branch block Confirmed by Aletta Edouard [phone]) on 03/09/2020 2:03:35 PM  EKG 03/09/20 1311: Atrial fibrillation with premature ventricular or aberrantly conducted complexes Left axis deviation Left bundle branch block Abnormal ECG new afib and LBBB since prior 3/20 Confirmed by Aletta Edouard [phone]) on 03/09/2020 1:23:34 PM  EKG 08/31/19 ([facility]): Sinus rhythm with frequent premature ventricular complexes Left axis deviation Left bundle branch block - LBBB new when compared to 04/21/18 tracing   CV: Carotid US 03/21/20: Summary:  - Right Carotid: Velocities in the right ICA are consistent with a 80-99% stenosis. The ECA appears >50% stenosed.  - Left Carotid: Velocities in the left ICA are consistent with a 80-99% stenosis. The ECA appears >50% stenosed.  - Vertebrals: Bilateral vertebral arteries demonstrate antegrade flow.  - Subclavians: Normal flow hemodynamics were seen in bilateral subclavian arteries.    Echo 04/08/18: IMPRESSIONS  1. The left ventricle has mildly reduced systolic function, with an  ejection fraction of 45-50%. The cavity size was normal. There is  moderately increased left ventricular wall thickness. 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 Left  ventricular diffuse hypokinesis.  2. The right ventricle has mildly reduced systolic function. The cavity  was mildly enlarged. There is no increase in right ventricular wall  thickness.  3. The mitral valve is degenerative. There is mild mitral annular   calcification present.  4. The aortic valve was not well visualized.  5. The aortic root and ascending aorta are normal in size and structure.  6. The inferior vena cava was dilated in size with <50% respiratory  variability.  7. Severe hypokinesis of the left ventricular basal to mid inferior and  inferoseptal.  8. The interatrial septum was not well visualized.    Cardiac cath 04/07/18:  Prox RCA lesion is 100% stenosed.  Ost LAD to Prox LAD lesion is 30% stenosed.  Mid LAD lesion is 40% stenosed.  Ost 2nd Mrg to 2nd Mrg lesion is 40% stenosed.  A drug-eluting stent was successfully placed using a STENT RESOLUTE ONYX 3.5X30.  Post intervention, there is a 0% residual stenosis.   1. 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 Ventricular fibrillation/cardiac arrest with successful resuscitation 3. Successful PTCA/DES x 1 proximal RCA.  4. Successful aspiration thrombectomy distal RCA 5. Mild to moderate non-obstructive disease in the Circumflex and LAD    Past Medical History:  Diagnosis Date  . Acute systolic (congestive) heart failure ([place])   . Anxiety   . Arthritis   . Atrial fibrillation ([place])   . Back pain   . Chronic kidney disease    pt told by PA of Dr. [name] that he had "stage 3 kidney so they had me stop taking Metformin"  . Coronary artery disease   . Diabetes mellitus without complication ([place])   . High cholesterol   . History of gout   . Hypertension   . Myocardial infarction ([place]) 04/2018  . Neuropathy   . PAD (peripheral artery disease) ([place])   . 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 Sleep apnea    Stop Bang score of 4  . STEMI (ST elevation myocardial infarction) ([place])    04/08/2018 PCI/DES RCA  . VT (ventricular tachycardia) ([place])     Past Surgical History:  Procedure Laterality Date  . APPENDECTOMY    . CARDIOVERSION N/A 04/13/2018   Procedure: CARDIOVERSION;  Surgeon: Skeet Latch, MD;  Location: [facility];  Service: Cardiovascular;  Laterality: N/A;  . CATARACT EXTRACTION W/PHACO Right 04/25/2014   Procedure: CATARACT EXTRACTION PHACO AND INTRAOCULAR LENS PLACEMENT ([place]);  Surgeon: Williams Che, MD;  Location: [facility];  Service: Ophthalmology;  Laterality: Right;  CDE:4.70  . CATARACT EXTRACTION W/PHACO Left 07/11/2014   Procedure: CATARACT EXTRACTION PHACO AND INTRAOCULAR LENS PLACEMENT LEFT EYE CDE=8.32;  Surgeon: Williams Che, MD;  Location: [facility];  Service: Ophthalmology;  Laterality: Left;  . COLONOSCOPY WITH PROPOFOL N/A 09/23/2016   Rourk: 9 polyps removed, multiple tubular adenomas. recommended 3 yr surveillance  . COLONOSCOPY WITH PROPOFOL N/A 12/27/2019   ten sessile polyps, 4-9 mm in size in descending, hepatic flexure, and cecum, 11 mm polyp in sigmoid, three sessile polyps in rectum and mid rectum 2-3 mm in size. Tubular adenomas and hyperplastic. 3 year surveillance  . CORONARY/GRAFT ACUTE MI REVASCULARIZATION N/A  04/07/2018   Procedure: Coronary/Graft Acute MI Revascularization;  Surgeon: Burnell Blanks, MD;  Location: [place];  Service: Cardiovascular;  Laterality: N/A;  . LEFT HEART CATH AND CORONARY ANGIOGRAPHY N/A 04/07/2018   Procedure: LEFT HEART CATH AND CORONARY ANGIOGRAPHY;  Surgeon: Burnell Blanks, MD;  Location: [place];  Service: Cardiovascular;  Laterality: N/A;  . MELANOMA EXCISION  07/2019  . POLYPECTOMY  09/23/2016   Procedure: POLYPECTOMY;  Surgeon: Daneil Dolin, MD;  [facility];  Service: Endoscopy;;  colon  . POLYPECTOMY  12/27/2019   Procedure: POLYPECTOMY;  Surgeon: Daneil Dolin, MD;  [facility];  Service: Endoscopy;;  . RETINAL DETACHMENT SURGERY      MEDICATIONS: . ALPRAZolam (XANAX) 0.5 MG tablet  . amLODipine (NORVASC) 10 MG tablet  . apixaban (ELIQUIS) 5 MG TABS tablet  . atorvastatin (LIPITOR) 80 MG tablet  . B Complex-C (SUPER B COMPLEX PO)   . carvedilol (COREG) 12.5 MG tablet  . DULoxetine (CYMBALTA) 60 MG capsule  . ergocalciferol (VITAMIN D2) 50000 units capsule  . gabapentin (NEURONTIN) 300 MG capsule  . glucose blood (ONETOUCH ULTRA) test strip  . hydrALAZINE (APRESOLINE) 25 MG tablet  . HYDROcodone-acetaminophen (NORCO/VICODIN) 5-325 MG per tablet  . Insulin Glargine (BASAGLAR KWIKPEN) 100 UNIT/ML  . linaclotide (LINZESS) 145 MCG CAPS capsule  . nitroGLYCERIN (NITROSTAT) 0.4 MG SL tablet  . rOPINIRole (REQUIP) 1 MG tablet  . timolol (TIMOPTIC) 0.5 % ophthalmic solution  . UNIFINE PENTIPS 31G X 5 MM MISC   No current facility-administered medications for this encounter.    Myra Gianotti, PA-C Surgical Short Stay/Anesthesiology [facility] Phone [phone] [facility] Phone [phone] 03/24/2020 4:08 PM

## 2020-03-24 NOTE — Anesthesia Preprocedure Evaluation (Addendum)
Anesthesia Evaluation  Patient identified by MRN, date of birth, ID band Patient awake    Reviewed: Allergy & Precautions, H&P , NPO status , Patient's Chart, lab work & pertinent test results  Airway Mallampati: II  TM Distance: >3 FB Neck ROM: Full    Dental no notable dental hx.    Pulmonary sleep apnea , former smoker,    Pulmonary exam normal breath sounds clear to auscultation       Cardiovascular hypertension, + CAD, + Past MI, + Cardiac Stents and + Peripheral Vascular Disease  Normal cardiovascular exam+ dysrhythmias Atrial Fibrillation  Rhythm:Regular Rate:Normal     Neuro/Psych negative neurological ROS  negative psych ROS   GI/Hepatic negative GI ROS, Neg liver ROS,   Endo/Other  negative endocrine ROSdiabetes  Renal/GU Renal InsufficiencyRenal disease  negative genitourinary   Musculoskeletal negative musculoskeletal ROS (+)   Abdominal   Peds negative pediatric ROS (+)  Hematology  (+) anemia ,   Anesthesia Other Findings   Reproductive/Obstetrics negative OB ROS                            Anesthesia Physical Anesthesia Plan  ASA: IV  Anesthesia Plan: General   Post-op Pain Management:    Induction: Intravenous  PONV Risk Score and Plan: 2 and Ondansetron and Dexamethasone  Airway Management Planned: Oral ETT  Additional Equipment: Arterial line  Intra-op Plan:   Post-operative Plan: Extubation in OR  Informed Consent: I have reviewed the patients History and Physical, chart, labs and discussed the procedure including the risks, benefits and alternatives for the proposed anesthesia with the patient or authorized representative who has indicated his/her understanding and acceptance.     Dental advisory given  Plan Discussed with: CRNA and Surgeon  Anesthesia Plan Comments: (PAT note written 03/24/2020 by Myra Gianotti, PA-C. )       Anesthesia Quick  Evaluation

## 2020-03-28 ENCOUNTER — Other Ambulatory Visit (HOSPITAL_COMMUNITY)
Admission: RE | Admit: 2020-03-28 | Discharge: 2020-03-28 | Disposition: A | Discharge status: Home / Self Care | Payer: Medicare HMO | Source: Ambulatory Visit | Attending: Vascular Surgery | Admitting: Vascular Surgery

## 2020-03-28 DIAGNOSIS — Z20822 Contact with and (suspected) exposure to covid-19: Secondary | ICD-10-CM | POA: Insufficient documentation

## 2020-03-28 DIAGNOSIS — Z01812 Encounter for preprocedural laboratory examination: Secondary | ICD-10-CM | POA: Insufficient documentation

## 2020-03-28 DIAGNOSIS — D649 Anemia, unspecified: Secondary | ICD-10-CM | POA: Diagnosis not present

## 2020-03-28 LAB — SARS CORONAVIRUS 2 (TAT 6-24 HRS): SARS Coronavirus 2: NEGATIVE

## 2020-03-29 ENCOUNTER — Inpatient Hospital Stay (HOSPITAL_COMMUNITY): Payer: Medicare HMO | Admitting: Certified Registered Nurse Anesthetist

## 2020-03-29 ENCOUNTER — Encounter (HOSPITAL_COMMUNITY): Payer: Self-pay | Admitting: Vascular Surgery

## 2020-03-29 ENCOUNTER — Other Ambulatory Visit: Payer: Self-pay

## 2020-03-29 ENCOUNTER — Inpatient Hospital Stay (HOSPITAL_COMMUNITY): Payer: Medicare HMO | Admitting: Vascular Surgery

## 2020-03-29 ENCOUNTER — Encounter (HOSPITAL_COMMUNITY)
Admission: RE | Disposition: A | Discharge status: Home / Self Care | Payer: Self-pay | Source: Home / Self Care | Attending: Vascular Surgery

## 2020-03-29 ENCOUNTER — Inpatient Hospital Stay (HOSPITAL_COMMUNITY)
Admission: RE | Admit: 2020-03-29 | Discharge: 2020-03-30 | DRG: 038 | Disposition: A | Discharge status: Home / Self Care | Payer: Medicare HMO | Attending: Vascular Surgery | Admitting: Vascular Surgery

## 2020-03-29 DIAGNOSIS — Z87891 Personal history of nicotine dependence: Secondary | ICD-10-CM | POA: Diagnosis not present

## 2020-03-29 DIAGNOSIS — Z79899 Other long term (current) drug therapy: Secondary | ICD-10-CM | POA: Diagnosis not present

## 2020-03-29 DIAGNOSIS — Z7901 Long term (current) use of anticoagulants: Secondary | ICD-10-CM

## 2020-03-29 DIAGNOSIS — I252 Old myocardial infarction: Secondary | ICD-10-CM | POA: Diagnosis not present

## 2020-03-29 DIAGNOSIS — I129 Hypertensive chronic kidney disease with stage 1 through stage 4 chronic kidney disease, or unspecified chronic kidney disease: Secondary | ICD-10-CM | POA: Diagnosis present

## 2020-03-29 DIAGNOSIS — Z9841 Cataract extraction status, right eye: Secondary | ICD-10-CM | POA: Diagnosis not present

## 2020-03-29 DIAGNOSIS — Z20822 Contact with and (suspected) exposure to covid-19: Secondary | ICD-10-CM | POA: Diagnosis not present

## 2020-03-29 DIAGNOSIS — E7800 Pure hypercholesterolemia, unspecified: Secondary | ICD-10-CM | POA: Diagnosis present

## 2020-03-29 DIAGNOSIS — Z961 Presence of intraocular lens: Secondary | ICD-10-CM | POA: Diagnosis present

## 2020-03-29 DIAGNOSIS — R69 Illness, unspecified: Secondary | ICD-10-CM | POA: Diagnosis not present

## 2020-03-29 DIAGNOSIS — Z9842 Cataract extraction status, left eye: Secondary | ICD-10-CM

## 2020-03-29 DIAGNOSIS — N183 Chronic kidney disease, stage 3 unspecified: Secondary | ICD-10-CM | POA: Diagnosis present

## 2020-03-29 DIAGNOSIS — I4891 Unspecified atrial fibrillation: Secondary | ICD-10-CM | POA: Diagnosis present

## 2020-03-29 DIAGNOSIS — E1122 Type 2 diabetes mellitus with diabetic chronic kidney disease: Secondary | ICD-10-CM | POA: Diagnosis present

## 2020-03-29 DIAGNOSIS — Z8674 Personal history of sudden cardiac arrest: Secondary | ICD-10-CM | POA: Diagnosis not present

## 2020-03-29 DIAGNOSIS — E1151 Type 2 diabetes mellitus with diabetic peripheral angiopathy without gangrene: Secondary | ICD-10-CM | POA: Diagnosis not present

## 2020-03-29 DIAGNOSIS — Z8249 Family history of ischemic heart disease and other diseases of the circulatory system: Secondary | ICD-10-CM | POA: Diagnosis not present

## 2020-03-29 DIAGNOSIS — I6521 Occlusion and stenosis of right carotid artery: Secondary | ICD-10-CM

## 2020-03-29 DIAGNOSIS — E785 Hyperlipidemia, unspecified: Secondary | ICD-10-CM | POA: Diagnosis present

## 2020-03-29 DIAGNOSIS — G4730 Sleep apnea, unspecified: Secondary | ICD-10-CM | POA: Diagnosis present

## 2020-03-29 DIAGNOSIS — F419 Anxiety disorder, unspecified: Secondary | ICD-10-CM | POA: Diagnosis present

## 2020-03-29 DIAGNOSIS — I480 Paroxysmal atrial fibrillation: Secondary | ICD-10-CM | POA: Diagnosis not present

## 2020-03-29 DIAGNOSIS — Z8719 Personal history of other diseases of the digestive system: Secondary | ICD-10-CM

## 2020-03-29 DIAGNOSIS — I6523 Occlusion and stenosis of bilateral carotid arteries: Secondary | ICD-10-CM | POA: Diagnosis not present

## 2020-03-29 DIAGNOSIS — I2510 Atherosclerotic heart disease of native coronary artery without angina pectoris: Secondary | ICD-10-CM | POA: Diagnosis present

## 2020-03-29 DIAGNOSIS — Z794 Long term (current) use of insulin: Secondary | ICD-10-CM | POA: Diagnosis not present

## 2020-03-29 DIAGNOSIS — D62 Acute posthemorrhagic anemia: Secondary | ICD-10-CM | POA: Diagnosis not present

## 2020-03-29 DIAGNOSIS — Z8673 Personal history of transient ischemic attack (TIA), and cerebral infarction without residual deficits: Secondary | ICD-10-CM

## 2020-03-29 DIAGNOSIS — Z85820 Personal history of malignant melanoma of skin: Secondary | ICD-10-CM | POA: Diagnosis not present

## 2020-03-29 DIAGNOSIS — I6529 Occlusion and stenosis of unspecified carotid artery: Secondary | ICD-10-CM | POA: Diagnosis present

## 2020-03-29 DIAGNOSIS — E1165 Type 2 diabetes mellitus with hyperglycemia: Secondary | ICD-10-CM | POA: Diagnosis not present

## 2020-03-29 DIAGNOSIS — I472 Ventricular tachycardia: Secondary | ICD-10-CM | POA: Diagnosis not present

## 2020-03-29 HISTORY — PX: ENDARTERECTOMY: SHX5162

## 2020-03-29 LAB — GLUCOSE, CAPILLARY
Glucose-Capillary: 91 mg/dL (ref 70–99)
Glucose-Capillary: 137 mg/dL — ABNORMAL HIGH (ref 70–99)
Glucose-Capillary: 149 mg/dL — ABNORMAL HIGH (ref 70–99)
Glucose-Capillary: 160 mg/dL — ABNORMAL HIGH (ref 70–99)
Glucose-Capillary: 263 mg/dL — ABNORMAL HIGH (ref 70–99)

## 2020-03-29 LAB — HEMOGLOBIN A1C
Hgb A1c MFr Bld: 7.9 % — ABNORMAL HIGH (ref 4.8–5.6)
Mean Plasma Glucose: 180.03 mg/dL

## 2020-03-29 LAB — CBC
HCT: 30.4 % — ABNORMAL LOW (ref 39.0–52.0)
Hemoglobin: 9.9 g/dL — ABNORMAL LOW (ref 13.0–17.0)
MCH: 29.2 pg (ref 26.0–34.0)
MCHC: 32.6 g/dL (ref 30.0–36.0)
MCV: 89.7 fL (ref 80.0–100.0)
Platelets: 242 10*3/uL (ref 150–400)
RBC: 3.39 MIL/uL — ABNORMAL LOW (ref 4.22–5.81)
RDW: 13.1 % (ref 11.5–15.5)
WBC: 10.7 10*3/uL — ABNORMAL HIGH (ref 4.0–10.5)
nRBC: 0 % (ref 0.0–0.2)

## 2020-03-29 LAB — CREATININE, SERUM
Creatinine, Ser: 1.37 mg/dL — ABNORMAL HIGH (ref 0.61–1.24)
GFR, Estimated: 57 mL/min — ABNORMAL LOW (ref >60)

## 2020-03-29 LAB — ABO/RH: ABO/RH(D): B NEG

## 2020-03-29 LAB — POCT ACTIVATED CLOTTING TIME
Activated Clotting Time: 231 seconds
Activated Clotting Time: 249 seconds

## 2020-03-29 SURGERY — ENDARTERECTOMY, CAROTID
Anesthesia: General | Site: Neck | Laterality: Right

## 2020-03-29 MED ORDER — LACTATED RINGERS IV SOLN: INTRAVENOUS | Status: DC

## 2020-03-29 MED ORDER — TIMOLOL MALEATE 0.5 % OP SOLN
1.0000 [drp] | Freq: Two times a day (BID) | OPHTHALMIC | Status: DC
  Administered 2020-03-29 – 2020-03-30 (×2): 1 [drp] via OPHTHALMIC
  Filled 2020-03-29: qty 5

## 2020-03-29 MED ORDER — CHLORHEXIDINE GLUCONATE CLOTH 2 % EX PADS: 6.0000 | MEDICATED_PAD | Freq: Once | CUTANEOUS | Status: DC

## 2020-03-29 MED ORDER — LINACLOTIDE 145 MCG PO CAPS
145.0000 ug | ORAL_CAPSULE | Freq: Every day | ORAL | Status: DC
  Administered 2020-03-30: 145 ug via ORAL
  Filled 2020-03-29: qty 1

## 2020-03-29 MED ORDER — MIDAZOLAM HCL 2 MG/2ML IJ SOLN
INTRAMUSCULAR | Status: DC | PRN
  Administered 2020-03-29: 1 mg via INTRAVENOUS

## 2020-03-29 MED ORDER — BISACODYL 5 MG PO TBEC: 5.0000 mg | DELAYED_RELEASE_TABLET | Freq: Every day | ORAL | Status: DC | PRN

## 2020-03-29 MED ORDER — LIDOCAINE 2% (20 MG/ML) 5 ML SYRINGE
INTRAMUSCULAR | Status: DC | PRN
  Administered 2020-03-29: 100 mg via INTRAVENOUS

## 2020-03-29 MED ORDER — INSULIN ASPART 100 UNIT/ML ~~LOC~~ SOLN: 0.0000 [IU] | Freq: Three times a day (TID) | SUBCUTANEOUS | Status: DC

## 2020-03-29 MED ORDER — HEPARIN SODIUM (PORCINE) 1000 UNIT/ML IJ SOLN
INTRAMUSCULAR | Status: DC | PRN
  Administered 2020-03-29: 10000 [IU] via INTRAVENOUS
  Administered 2020-03-29: 2000 [IU] via INTRAVENOUS
  Administered 2020-03-29: 1000 [IU] via INTRAVENOUS

## 2020-03-29 MED ORDER — ASPIRIN EC 325 MG PO TBEC: 325.0000 mg | DELAYED_RELEASE_TABLET | Freq: Every day | ORAL | Status: DC

## 2020-03-29 MED ORDER — INSULIN GLARGINE 100 UNIT/ML ~~LOC~~ SOLN
30.0000 [IU] | Freq: Every day | SUBCUTANEOUS | Status: DC
  Administered 2020-03-29: 30 [IU] via SUBCUTANEOUS
  Filled 2020-03-29 (×3): qty 0.3

## 2020-03-29 MED ORDER — CEFAZOLIN SODIUM-DEXTROSE 2-4 GM/100ML-% IV SOLN
INTRAVENOUS | Status: AC
  Filled 2020-03-29: qty 100

## 2020-03-29 MED ORDER — CEFAZOLIN SODIUM-DEXTROSE 2-4 GM/100ML-% IV SOLN
2.0000 g | Freq: Three times a day (TID) | INTRAVENOUS | Status: AC
  Administered 2020-03-29 (×2): 2 g via INTRAVENOUS
  Filled 2020-03-29 (×2): qty 100

## 2020-03-29 MED ORDER — HYDRALAZINE HCL 20 MG/ML IJ SOLN: 5.0000 mg | INTRAMUSCULAR | Status: DC | PRN

## 2020-03-29 MED ORDER — DEXAMETHASONE SODIUM PHOSPHATE 10 MG/ML IJ SOLN
INTRAMUSCULAR | Status: DC | PRN
  Administered 2020-03-29: 4 mg via INTRAVENOUS

## 2020-03-29 MED ORDER — CLEVIDIPINE BUTYRATE 0.5 MG/ML IV EMUL
INTRAVENOUS | Status: DC | PRN
  Administered 2020-03-29: 2 mg/h via INTRAVENOUS

## 2020-03-29 MED ORDER — HEPARIN SODIUM (PORCINE) 5000 UNIT/ML IJ SOLN
5000.0000 [IU] | Freq: Three times a day (TID) | INTRAMUSCULAR | Status: DC
  Administered 2020-03-29 – 2020-03-30 (×2): 5000 [IU] via SUBCUTANEOUS
  Filled 2020-03-29 (×2): qty 1

## 2020-03-29 MED ORDER — NITROGLYCERIN 0.4 MG SL SUBL: 0.4000 mg | SUBLINGUAL_TABLET | SUBLINGUAL | Status: DC | PRN

## 2020-03-29 MED ORDER — HYDROCODONE-ACETAMINOPHEN 5-325 MG PO TABS: 1.0000 | ORAL_TABLET | Freq: Four times a day (QID) | ORAL | Status: DC | PRN

## 2020-03-29 MED ORDER — PHENOL 1.4 % MT LIQD: 1.0000 | OROMUCOSAL | Status: DC | PRN

## 2020-03-29 MED ORDER — ONDANSETRON HCL 4 MG/2ML IJ SOLN: 4.0000 mg | Freq: Four times a day (QID) | INTRAMUSCULAR | Status: DC | PRN

## 2020-03-29 MED ORDER — CHLORHEXIDINE GLUCONATE 0.12 % MT SOLN
OROMUCOSAL | Status: AC
  Administered 2020-03-29: 15 mL via OROMUCOSAL
  Filled 2020-03-29: qty 15

## 2020-03-29 MED ORDER — LIDOCAINE HCL (PF) 1 % IJ SOLN
INTRAMUSCULAR | Status: AC
  Filled 2020-03-29: qty 5

## 2020-03-29 MED ORDER — ONDANSETRON HCL 4 MG/2ML IJ SOLN
INTRAMUSCULAR | Status: DC | PRN
  Administered 2020-03-29: 4 mg via INTRAVENOUS

## 2020-03-29 MED ORDER — PROPOFOL 10 MG/ML IV BOLUS
INTRAVENOUS | Status: DC | PRN
  Administered 2020-03-29: 10 mg via INTRAVENOUS
  Administered 2020-03-29: 150 mg via INTRAVENOUS

## 2020-03-29 MED ORDER — LABETALOL HCL 5 MG/ML IV SOLN: 10.0000 mg | INTRAVENOUS | Status: DC | PRN

## 2020-03-29 MED ORDER — DEXAMETHASONE SODIUM PHOSPHATE 10 MG/ML IJ SOLN
INTRAMUSCULAR | Status: AC
  Filled 2020-03-29: qty 1

## 2020-03-29 MED ORDER — GABAPENTIN 300 MG PO CAPS
300.0000 mg | ORAL_CAPSULE | Freq: Two times a day (BID) | ORAL | Status: DC
  Administered 2020-03-29 – 2020-03-30 (×2): 300 mg via ORAL
  Filled 2020-03-29 (×2): qty 1

## 2020-03-29 MED ORDER — ALPRAZOLAM 0.5 MG PO TABS: 0.5000 mg | ORAL_TABLET | Freq: Two times a day (BID) | ORAL | Status: DC | PRN

## 2020-03-29 MED ORDER — ACETAMINOPHEN 325 MG PO TABS: 325.0000 mg | ORAL_TABLET | ORAL | Status: DC | PRN

## 2020-03-29 MED ORDER — INSULIN ASPART 100 UNIT/ML ~~LOC~~ SOLN
0.0000 [IU] | Freq: Three times a day (TID) | SUBCUTANEOUS | Status: DC
  Administered 2020-03-29: 2 [IU] via SUBCUTANEOUS
  Administered 2020-03-29 – 2020-03-30 (×2): 3 [IU] via SUBCUTANEOUS

## 2020-03-29 MED ORDER — CEFAZOLIN SODIUM-DEXTROSE 2-4 GM/100ML-% IV SOLN
2.0000 g | INTRAVENOUS | Status: AC
  Administered 2020-03-29: 2 g via INTRAVENOUS

## 2020-03-29 MED ORDER — DULOXETINE HCL 60 MG PO CPEP: 60.0000 mg | ORAL_CAPSULE | Freq: Every day | ORAL | Status: DC

## 2020-03-29 MED ORDER — SODIUM CHLORIDE 0.9 % IV SOLN
INTRAVENOUS | Status: DC | PRN
  Administered 2020-03-29: 500 mL

## 2020-03-29 MED ORDER — HYDRALAZINE HCL 25 MG PO TABS
25.0000 mg | ORAL_TABLET | Freq: Three times a day (TID) | ORAL | Status: DC
  Administered 2020-03-29 – 2020-03-30 (×3): 25 mg via ORAL
  Filled 2020-03-29 (×3): qty 1

## 2020-03-29 MED ORDER — SODIUM CHLORIDE 0.9 % IV SOLN: 500.0000 mL | Freq: Once | INTRAVENOUS | Status: DC | PRN

## 2020-03-29 MED ORDER — METOPROLOL TARTRATE 5 MG/5ML IV SOLN
2.0000 mg | INTRAVENOUS | Status: DC | PRN
Start: 2020-03-29 — End: 2020-03-30

## 2020-03-29 MED ORDER — SODIUM CHLORIDE 0.9 % IV SOLN
INTRAVENOUS | Status: AC
  Filled 2020-03-29: qty 1.2

## 2020-03-29 MED ORDER — OXYCODONE HCL 5 MG PO TABS
5.0000 mg | ORAL_TABLET | Freq: Once | ORAL | Status: DC | PRN
Start: 2020-03-29 — End: 2020-03-29

## 2020-03-29 MED ORDER — GUAIFENESIN-DM 100-10 MG/5ML PO SYRP: 15.0000 mL | ORAL_SOLUTION | ORAL | Status: DC | PRN

## 2020-03-29 MED ORDER — DEXAMETHASONE SODIUM PHOSPHATE 10 MG/ML IJ SOLN: INTRAMUSCULAR | Status: DC | PRN

## 2020-03-29 MED ORDER — CARVEDILOL 12.5 MG PO TABS
12.5000 mg | ORAL_TABLET | Freq: Two times a day (BID) | ORAL | Status: DC
  Administered 2020-03-29 – 2020-03-30 (×2): 12.5 mg via ORAL
  Filled 2020-03-29 (×2): qty 1

## 2020-03-29 MED ORDER — GLYCOPYRROLATE PF 0.2 MG/ML IJ SOSY
PREFILLED_SYRINGE | INTRAMUSCULAR | Status: DC | PRN
  Administered 2020-03-29 (×2): .1 mg via INTRAVENOUS

## 2020-03-29 MED ORDER — POTASSIUM CHLORIDE CRYS ER 20 MEQ PO TBCR: 20.0000 meq | EXTENDED_RELEASE_TABLET | Freq: Every day | ORAL | Status: DC | PRN

## 2020-03-29 MED ORDER — PANTOPRAZOLE SODIUM 40 MG PO TBEC
40.0000 mg | DELAYED_RELEASE_TABLET | Freq: Every day | ORAL | Status: DC
  Administered 2020-03-29 – 2020-03-30 (×2): 40 mg via ORAL
  Filled 2020-03-29 (×2): qty 1

## 2020-03-29 MED ORDER — SODIUM CHLORIDE 0.9 % IV SOLN: INTRAVENOUS | Status: DC

## 2020-03-29 MED ORDER — PROTAMINE SULFATE 10 MG/ML IV SOLN
INTRAVENOUS | Status: DC | PRN
  Administered 2020-03-29: 40 mg via INTRAVENOUS
  Administered 2020-03-29: 10 mg via INTRAVENOUS

## 2020-03-29 MED ORDER — SENNOSIDES-DOCUSATE SODIUM 8.6-50 MG PO TABS: 1.0000 | ORAL_TABLET | Freq: Every evening | ORAL | Status: DC | PRN

## 2020-03-29 MED ORDER — MAGNESIUM SULFATE 2 GM/50ML IV SOLN: 2.0000 g | Freq: Every day | INTRAVENOUS | Status: DC | PRN

## 2020-03-29 MED ORDER — DOCUSATE SODIUM 100 MG PO CAPS
100.0000 mg | ORAL_CAPSULE | Freq: Every day | ORAL | Status: DC
  Administered 2020-03-30: 100 mg via ORAL
  Filled 2020-03-29: qty 1

## 2020-03-29 MED ORDER — ROPINIROLE HCL 1 MG PO TABS
1.0000 mg | ORAL_TABLET | Freq: Four times a day (QID) | ORAL | Status: DC
  Administered 2020-03-29 – 2020-03-30 (×3): 1 mg via ORAL
  Filled 2020-03-29 (×4): qty 1

## 2020-03-29 MED ORDER — PROTAMINE SULFATE 10 MG/ML IV SOLN
INTRAVENOUS | Status: AC
  Filled 2020-03-29: qty 5

## 2020-03-29 MED ORDER — ATORVASTATIN CALCIUM 80 MG PO TABS
80.0000 mg | ORAL_TABLET | Freq: Every day | ORAL | Status: DC
  Administered 2020-03-30: 80 mg via ORAL
  Filled 2020-03-29: qty 1

## 2020-03-29 MED ORDER — FENTANYL CITRATE (PF) 250 MCG/5ML IJ SOLN
INTRAMUSCULAR | Status: AC
  Filled 2020-03-29: qty 5

## 2020-03-29 MED ORDER — ALUM & MAG HYDROXIDE-SIMETH 200-200-20 MG/5ML PO SUSP
15.0000 mL | ORAL | Status: DC | PRN
Start: 2020-03-29 — End: 2020-03-30

## 2020-03-29 MED ORDER — FENTANYL CITRATE (PF) 100 MCG/2ML IJ SOLN
INTRAMUSCULAR | Status: DC | PRN
  Administered 2020-03-29: 50 ug via INTRAVENOUS
  Administered 2020-03-29: 100 ug via INTRAVENOUS
  Administered 2020-03-29: 50 ug via INTRAVENOUS

## 2020-03-29 MED ORDER — ROCURONIUM BROMIDE 10 MG/ML (PF) SYRINGE
PREFILLED_SYRINGE | INTRAVENOUS | Status: DC | PRN
  Administered 2020-03-29: 80 mg via INTRAVENOUS

## 2020-03-29 MED ORDER — ACETAMINOPHEN 650 MG RE SUPP: 325.0000 mg | RECTAL | Status: DC | PRN

## 2020-03-29 MED ORDER — HEMOSTATIC AGENTS (NO CHARGE) OPTIME
TOPICAL | Status: DC | PRN
  Administered 2020-03-29: 1 via TOPICAL

## 2020-03-29 MED ORDER — ASPIRIN EC 325 MG PO TBEC
325.0000 mg | DELAYED_RELEASE_TABLET | Freq: Every day | ORAL | Status: AC
  Administered 2020-03-29: 325 mg via ORAL
  Filled 2020-03-29: qty 1

## 2020-03-29 MED ORDER — ORAL CARE MOUTH RINSE: 15.0000 mL | Freq: Once | OROMUCOSAL | Status: AC

## 2020-03-29 MED ORDER — ASPIRIN EC 81 MG PO TBEC
81.0000 mg | DELAYED_RELEASE_TABLET | Freq: Every day | ORAL | Status: DC
  Administered 2020-03-30: 81 mg via ORAL
  Filled 2020-03-29: qty 1

## 2020-03-29 MED ORDER — AMLODIPINE BESYLATE 10 MG PO TABS
10.0000 mg | ORAL_TABLET | Freq: Every day | ORAL | Status: DC
  Administered 2020-03-30: 10 mg via ORAL
  Filled 2020-03-29: qty 1

## 2020-03-29 MED ORDER — ONDANSETRON HCL 4 MG/2ML IJ SOLN
INTRAMUSCULAR | Status: AC
  Filled 2020-03-29: qty 2

## 2020-03-29 MED ORDER — CHLORHEXIDINE GLUCONATE 0.12 % MT SOLN: 15.0000 mL | Freq: Once | OROMUCOSAL | Status: AC

## 2020-03-29 MED ORDER — 0.9 % SODIUM CHLORIDE (POUR BTL) OPTIME
TOPICAL | Status: DC | PRN
  Administered 2020-03-29: 2000 mL

## 2020-03-29 MED ORDER — SUGAMMADEX SODIUM 200 MG/2ML IV SOLN
INTRAVENOUS | Status: DC | PRN
  Administered 2020-03-29: 200 mg via INTRAVENOUS

## 2020-03-29 MED ORDER — ROCURONIUM BROMIDE 10 MG/ML (PF) SYRINGE
PREFILLED_SYRINGE | INTRAVENOUS | Status: AC
  Filled 2020-03-29: qty 10

## 2020-03-29 MED ORDER — HYDROMORPHONE HCL 1 MG/ML IJ SOLN
INTRAMUSCULAR | Status: AC
  Filled 2020-03-29: qty 1

## 2020-03-29 MED ORDER — MIDAZOLAM HCL 2 MG/2ML IJ SOLN
INTRAMUSCULAR | Status: AC
  Filled 2020-03-29: qty 2

## 2020-03-29 MED ORDER — PROPOFOL 10 MG/ML IV BOLUS
INTRAVENOUS | Status: AC
  Filled 2020-03-29: qty 20

## 2020-03-29 MED ORDER — HYDROMORPHONE HCL 1 MG/ML IJ SOLN
0.5000 mg | INTRAMUSCULAR | Status: DC | PRN
Start: 2020-03-29 — End: 2020-03-30

## 2020-03-29 MED ORDER — OXYCODONE-ACETAMINOPHEN 5-325 MG PO TABS
1.0000 | ORAL_TABLET | ORAL | Status: DC | PRN
Start: 2020-03-29 — End: 2020-03-30
  Administered 2020-03-29: 2 via ORAL
  Filled 2020-03-29: qty 2

## 2020-03-29 MED ORDER — LIDOCAINE 2% (20 MG/ML) 5 ML SYRINGE
INTRAMUSCULAR | Status: AC
  Filled 2020-03-29: qty 5

## 2020-03-29 MED ORDER — HYDROMORPHONE HCL 1 MG/ML IJ SOLN
0.2500 mg | INTRAMUSCULAR | Status: DC | PRN
  Administered 2020-03-29: 0.5 mg via INTRAVENOUS

## 2020-03-29 MED ORDER — HEPARIN SODIUM (PORCINE) 1000 UNIT/ML IJ SOLN
INTRAMUSCULAR | Status: AC
  Filled 2020-03-29: qty 1

## 2020-03-29 MED ORDER — PHENYLEPHRINE HCL-NACL 10-0.9 MG/250ML-% IV SOLN
INTRAVENOUS | Status: DC | PRN
  Administered 2020-03-29: 10 ug/min via INTRAVENOUS

## 2020-03-29 MED ORDER — OXYCODONE HCL 5 MG/5ML PO SOLN
5.0000 mg | Freq: Once | ORAL | Status: DC | PRN
Start: 2020-03-29 — End: 2020-03-29

## 2020-03-29 MED ORDER — ONDANSETRON HCL 4 MG/2ML IJ SOLN: 4.0000 mg | Freq: Once | INTRAMUSCULAR | Status: DC | PRN

## 2020-03-29 SURGICAL SUPPLY — 53 items
CANISTER SUCT 3000ML PPV (MISCELLANEOUS) ×2 IMPLANT
CATH ROBINSON RED A/P 18FR (CATHETERS) ×2 IMPLANT
CLIP VESOCCLUDE MED 24/CT (CLIP) ×2 IMPLANT
CLIP VESOCCLUDE SM WIDE 24/CT (CLIP) ×2 IMPLANT
COVER PROBE W GEL 5X96 (DRAPES) IMPLANT
COVER TRANSDUCER ULTRASND GEL (DISPOSABLE) ×2 IMPLANT
COVER WAND RF STERILE (DRAPES) IMPLANT
DERMABOND ADVANCED (GAUZE/BANDAGES/DRESSINGS) ×1
DERMABOND ADVANCED .7 DNX12 (GAUZE/BANDAGES/DRESSINGS) ×1 IMPLANT
DRAIN CHANNEL 15F RND FF W/TCR (WOUND CARE) IMPLANT
ELECT REM PT RETURN 9FT ADLT (ELECTROSURGICAL) ×2
ELECTRODE REM PT RTRN 9FT ADLT (ELECTROSURGICAL) ×1 IMPLANT
EVACUATOR SILICONE 100CC (DRAIN) IMPLANT
GLOVE BIO SURGEON STRL SZ7.5 (GLOVE) ×2 IMPLANT
GLOVE SRG 8 PF TXTR STRL LF DI (GLOVE) ×1 IMPLANT
GLOVE SURG UNDER POLY LF SZ7 (GLOVE) ×4 IMPLANT
GLOVE SURG UNDER POLY LF SZ7.5 (GLOVE) ×2 IMPLANT
GLOVE SURG UNDER POLY LF SZ8 (GLOVE) ×2
GOWN STRL REUS W/ TWL LRG LVL3 (GOWN DISPOSABLE) ×2 IMPLANT
GOWN STRL REUS W/ TWL XL LVL3 (GOWN DISPOSABLE) ×1 IMPLANT
GOWN STRL REUS W/TWL LRG LVL3 (GOWN DISPOSABLE) ×4
GOWN STRL REUS W/TWL XL LVL3 (GOWN DISPOSABLE) ×2
HEMOSTAT SNOW SURGICEL 2X4 (HEMOSTASIS) ×2 IMPLANT
IV ADAPTER SYR DOUBLE MALE LL (MISCELLANEOUS) IMPLANT
KIT BASIN OR (CUSTOM PROCEDURE TRAY) ×2 IMPLANT
KIT SHUNT ARGYLE CAROTID ART 6 (VASCULAR PRODUCTS) IMPLANT
KIT TURNOVER KIT B (KITS) ×2 IMPLANT
LOOP VESSEL MINI RED (MISCELLANEOUS) IMPLANT
NEEDLE HYPO 25GX1X1/2 BEV (NEEDLE) IMPLANT
NEEDLE SPNL 20GX3.5 QUINCKE YW (NEEDLE) IMPLANT
NS IRRIG 1000ML POUR BTL (IV SOLUTION) ×6 IMPLANT
PACK CAROTID (CUSTOM PROCEDURE TRAY) ×2 IMPLANT
PAD ARMBOARD 7.5X6 YLW CONV (MISCELLANEOUS) ×4 IMPLANT
PATCH VASC XENOSURE 1CMX6CM (Vascular Products) ×2 IMPLANT
PATCH VASC XENOSURE 1X6 (Vascular Products) ×1 IMPLANT
POSITIONER HEAD DONUT 9IN (MISCELLANEOUS) ×2 IMPLANT
SHUNT CAROTID BYPASS 10 (VASCULAR PRODUCTS) IMPLANT
SHUNT CAROTID BYPASS 12FRX15.5 (VASCULAR PRODUCTS) IMPLANT
SPONGE SURGIFOAM ABS GEL 100 (HEMOSTASIS) IMPLANT
STOPCOCK 4 WAY LG BORE MALE ST (IV SETS) IMPLANT
SUT ETHILON 3 0 PS 1 (SUTURE) IMPLANT
SUT MNCRL AB 4-0 PS2 18 (SUTURE) ×2 IMPLANT
SUT PROLENE 5 0 C 1 24 (SUTURE) ×4 IMPLANT
SUT PROLENE 6 0 BV (SUTURE) ×10 IMPLANT
SUT SILK 3 0 (SUTURE) ×2
SUT SILK 3-0 18XBRD TIE 12 (SUTURE) ×1 IMPLANT
SUT VIC AB 3-0 SH 27 (SUTURE) ×2
SUT VIC AB 3-0 SH 27X BRD (SUTURE) ×1 IMPLANT
SYR BULB IRRIG 60ML STRL (SYRINGE) ×2 IMPLANT
SYR CONTROL 10ML LL (SYRINGE) IMPLANT
TOWEL GREEN STERILE (TOWEL DISPOSABLE) ×2 IMPLANT
TUBING ART PRESS 48 MALE/FEM (TUBING) IMPLANT
WATER STERILE IRR 1000ML POUR (IV SOLUTION) ×2 IMPLANT

## 2020-03-29 NOTE — Discharge Instructions (Signed)
   [facility]  Discharge Instructions   Carotid Endarterectomy (CEA)  Please refer to the following instructions for your post-procedure care. Your surgeon or physician assistant will discuss any changes with you.  Activity  You are encouraged to walk as much as you can. You can slowly return to normal activities but must avoid strenuous activity and heavy lifting until your doctor tell you it's OK. Avoid activities such as vacuuming or swinging a golf club. You can drive after one week if you are comfortable and you are no longer taking prescription pain medications. It is normal to feel tired for serval weeks after your surgery. It is also normal to have difficulty with sleep habits, eating, and bowel movements after surgery. These will go away with time.  Bathing/Showering  You may shower after you come home. Do not soak in a bathtub, hot tub, or swim until the incision heals completely.  Incision Care  Shower every day. Clean your incision with mild soap and water. Pat the area dry with a clean towel. You do not need a bandage unless otherwise instructed. Do not apply any ointments or creams to your incision. You may have skin glue on your incision. Do not peel it off. It will come off on its own in about one week. Your incision may feel thickened and raised for several weeks after your surgery. This is normal and the skin will soften over time. For Men Only: It's OK to shave around the incision but do not shave the incision itself for 2 weeks. It is common to have numbness under your chin that could last for several months.  Diet  Resume your normal diet. There are no special food restrictions following this procedure. A low fat/low cholesterol diet is recommended for all patients with vascular disease. In order to heal from your surgery, it is CRITICAL to get adequate nutrition. Your body requires vitamins, minerals, and protein. Vegetables are the best  source of vitamins and minerals. Vegetables also provide the perfect balance of protein. Processed food has little nutritional value, so try to avoid this.        Medications  Resume taking all of your medications unless your doctor or physician assistant tells you not to. If your incision is causing pain, you may take over-the- counter pain relievers such as acetaminophen (Tylenol). If you were prescribed a stronger pain medication, please be aware these medications can cause nausea and constipation. Prevent nausea by taking the medication with a snack or meal. Avoid constipation by drinking plenty of fluids and eating foods with a high amount of fiber, such as fruits, vegetables, and grains. Do not take Tylenol if you are taking prescription pain medications.  Follow Up  Our office will schedule a follow up appointment 2-3 weeks following discharge.  Please call us immediately for any of the following conditions  Increased pain, redness, drainage (pus) from your incision site. Fever of 101 degrees or higher. If you should develop stroke (slurred speech, difficulty swallowing, weakness on one side of your body, loss of vision) you should call 911 and go to the nearest emergency room.  Reduce your risk of vascular disease:  Stop smoking. If you would like help call [facility] at 1-800-QUIT-NOW (1-800-784-8669) or [facility] at 336-586-4000. Manage your cholesterol Maintain a desired weight Control your diabetes Keep your blood pressure down  If you have any questions, please call the office at [phone].   

## 2020-03-29 NOTE — Anesthesia Procedure Notes (Addendum)
Arterial Line Insertion Start/End2/23/2022 7:05 AM, 03/29/2020 7:20 AM Performed by: Leonor Liv, CRNA, CRNA  Patient location: Pre-op. Preanesthetic checklist: patient identified, IV checked, site marked, risks and benefits discussed, surgical consent, monitors and equipment checked, pre-op evaluation, timeout performed and anesthesia consent Lidocaine 1% used for infiltration Left, radial was placed Catheter size: 20 G Hand hygiene performed  and maximum sterile barriers used  Allen's test indicative of satisfactory collateral circulation Attempts: 1 Procedure performed without using ultrasound guided technique. Following insertion, dressing applied and Biopatch. Post procedure assessment: normal  Patient tolerated the procedure well with no immediate complications.

## 2020-03-29 NOTE — Anesthesia Procedure Notes (Signed)
Procedure Name: Intubation Date/Time: 03/29/2020 7:44 AM Performed by: Leonor Liv, CRNA Pre-anesthesia Checklist: Patient identified, Emergency Drugs available, Suction available and Patient being monitored Patient Re-evaluated:Patient Re-evaluated prior to induction Oxygen Delivery Method: Circle System Utilized Preoxygenation: Pre-oxygenation with 100% oxygen Induction Type: IV induction Ventilation: Mask ventilation without difficulty Laryngoscope Size: Mac and 4 Grade View: Grade II Tube type: Oral Tube size: 7.5 mm Number of attempts: 1 Airway Equipment and Method: Stylet and Oral airway Placement Confirmation: ETT inserted through vocal cords under direct vision,  positive ETCO2 and breath sounds checked- equal and bilateral Secured at: 23 cm Tube secured with: Tape (left ) Dental Injury: Teeth and Oropharynx as per pre-operative assessment

## 2020-03-29 NOTE — Progress Notes (Signed)
   No new complaints.  Very happy with his care and that surgery is over.  Moving all ext Palpable radial pulse, right neck incision with ecchymosis, no hematoma. No tongue deviation or facial droop  S/P right CEA Stable post op. Roxy Horseman PA-C

## 2020-03-29 NOTE — H&P (Signed)
History and Physical Interval Note:  03/29/2020 7:24 AM  Xavier Caldwell  has presented today for surgery, with the diagnosis of Carotid Artery Stenosis.  The various methods of treatment have been discussed with the patient and family. After consideration of risks, benefits and other options for treatment, the patient has consented to  Procedure(s): RIGHT CAROTID ARTERY ENDARTERECTOMY (Right) as a surgical intervention.  The patient's history has been reviewed, patient examined, no change in status, stable for surgery.  I have reviewed the patient's chart and labs.  Questions were answered to the patient's satisfaction.    Right CEA.  Symptomatic high grade stenosis.  Risks and benefits discussed again.  Xavier Caldwell  Patient name: Xavier Caldwell            MRN: [id number]        DOB: [birth year]            Sex: male  REASON FOR VISIT: 6 month follow-up for surveillance of carotid artery disease and PVD  HPI: Xavier Caldwell is a 70 y.o. male with history of coronary disease status post NSTEMI complicated by V. tach arrest, A. fib on Eliquis, asymptomatic carotid stenosis, HTN, HLD that presents for 6 month follow-up for surveillance of his PVD and carotid artery disease.    As it related to his carotid arteries, patient was being followed by his cardiologist Dr. Harl Bowie who noted a carotid bruit and subsequently got carotid duplex revealing greater than 70% stenosis bilaterally.  Repeat duplex is in our office in February last year showed 60 to 79% stenosis on the right and 60 to 79% stenosis on the left.  He has remained asymptomatic and we have been following him.    Unfortunately he was hospitalized at [facility] earlier this month.  Sounds like he had bilateral lower extremity weakness that then progressed to the left upper and lower extremity weakness.  He was evaluated by neurology.  MRI did not show any acute event.  He did get CTAs of the neck that showed over 70% calcified stenosis  bilaterally.  He denies any neck surgery or neck radiation.  His lower legs are doing fine from a PVD standpoint with no complaints.       Past Medical History:  Diagnosis Date  . Acute systolic (congestive) heart failure ([place])   . Anxiety   . Arthritis   . Atrial fibrillation ([place])   . Back pain   . Coronary artery disease   . Diabetes mellitus without complication ([place])   . High cholesterol   . History of gout   . Hypertension   . Myocardial infarction ([place]) 04/2018  . Neuropathy   . PAD (peripheral artery disease) ([place])   . 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 Sleep apnea    Stop Bang score of 4  . STEMI (ST elevation myocardial infarction) ([place])    04/08/2018 PCI/DES RCA  . VT (ventricular tachycardia) ([place])          Past Surgical History:  Procedure Laterality Date  . APPENDECTOMY    . CARDIOVERSION N/A 04/13/2018   Procedure: CARDIOVERSION;  Surgeon: Skeet Latch, MD;  Location: [place];  Service: Cardiovascular;  Laterality: N/A;  . CATARACT EXTRACTION W/PHACO Right 04/25/2014   Procedure: CATARACT EXTRACTION PHACO AND INTRAOCULAR LENS PLACEMENT ([place]);  Surgeon: Williams Che, MD;  Location: [facility];  Service: Ophthalmology;  Laterality: Right;  CDE:4.70  . CATARACT  EXTRACTION W/PHACO Left 07/11/2014   Procedure: CATARACT EXTRACTION PHACO AND INTRAOCULAR LENS PLACEMENT LEFT EYE CDE=8.32;  Surgeon: Williams Che, MD;  Location: [facility];  Service: Ophthalmology;  Laterality: Left;  . COLONOSCOPY WITH PROPOFOL N/A 09/23/2016   [place]: 9 polyps removed, multiple tubular adenomas. recommended 3 yr surveillance  . COLONOSCOPY WITH PROPOFOL N/A 12/27/2019   ten sessile polyps, 4-9 mm in size in descending, hepatic flexure, and cecum, 11 mm polyp in sigmoid, three sessile polyps in rectum and mid rectum 2-3 mm in size. Tubular adenomas and hyperplastic. 3 year surveillance  . CORONARY/GRAFT ACUTE MI REVASCULARIZATION  N/A 04/07/2018   Procedure: Coronary/Graft Acute MI Revascularization;  Surgeon: Burnell Blanks, MD;  Location: [place] [facility];  Service: Cardiovascular;  Laterality: N/A;  . LEFT HEART CATH AND CORONARY ANGIOGRAPHY N/A 04/07/2018   Procedure: LEFT HEART CATH AND CORONARY ANGIOGRAPHY;  Surgeon: Burnell Blanks, MD;  Location: [place] [facility];  Service: Cardiovascular;  Laterality: N/A;  . MELANOMA EXCISION  07/2019  . POLYPECTOMY  09/23/2016   Procedure: POLYPECTOMY;  Surgeon: Daneil Dolin, MD;  [facility];  Service: Endoscopy;;  colon  . POLYPECTOMY  12/27/2019   Procedure: POLYPECTOMY;  Surgeon: Daneil Dolin, MD;  [facility];  Service: Endoscopy;;  . RETINAL DETACHMENT SURGERY      Family History  Problem Relation Age of Onset  . Hypertension Father   . Colon cancer Neg Hx     SOCIAL HISTORY: Social History        Tobacco Use  . Smoking status: Former Smoker    Packs/day: 1.50    Years: 30.00    Pack years: 45.00    Types: Cigarettes    Quit date: 07/05/2012    Years since quitting: 7.7  . Smokeless tobacco: Never Used  Substance Use Topics  . Alcohol use: No    No Known Allergies        Current Outpatient Medications  Medication Sig Dispense Refill  . ALPRAZolam (XANAX) 0.5 MG tablet Take 0.5 mg by mouth 2 (two) times daily as needed for anxiety.    Marland Kitchen amLODipine (NORVASC) 10 MG tablet Take 1 tablet (10 mg total) by mouth daily. 30 tablet 1  . apixaban (ELIQUIS) 5 MG TABS tablet Take 1 tablet (5 mg total) by mouth 2 (two) times daily. 60 tablet 6  . B Complex-C (SUPER B COMPLEX PO) Take 1 tablet by mouth daily.     . carvedilol (COREG) 12.5 MG tablet Take 1 tablet (12.5 mg total) by mouth 2 (two) times daily. 180 tablet 1  . DULoxetine (CYMBALTA) 60 MG capsule Take 60 mg by mouth daily with lunch.     . ergocalciferol (VITAMIN D2) 50000 units capsule Take 50,000 Units by mouth every  Sunday.     . gabapentin (NEURONTIN) 300 MG capsule Take 300 mg by mouth 2 (two) times daily.     Marland Kitchen glucose blood (ONETOUCH ULTRA) test strip USE ONE TEST STRIP AS DIRECTED TWICE DAILY 100 each 0  . hydrALAZINE (APRESOLINE) 25 MG tablet Take 1 tablet (25 mg total) by mouth 3 (three) times daily. 90 tablet 0  . HYDROcodone-acetaminophen (NORCO/VICODIN) 5-325 MG per tablet Take 1 tablet by mouth every 6 (six) hours as needed for moderate pain.    . Insulin Glargine (BASAGLAR KWIKPEN) 100 UNIT/ML Inject 30 Units into the skin at bedtime. 30 mL 3  . linaclotide (LINZESS) 145 MCG CAPS capsule Take 1 capsule (  145 mcg total) by mouth daily before breakfast. 30 capsule 3  . nitroGLYCERIN (NITROSTAT) 0.4 MG SL tablet Place 1 tablet (0.4 mg total) under the tongue every 5 (five) minutes as needed. (Patient taking differently: Place 0.4 mg under the tongue every 5 (five) minutes as needed for chest pain.) 25 tablet 2  . rOPINIRole (REQUIP) 1 MG tablet Take 1 mg by mouth 4 (four) times daily.     . timolol (TIMOPTIC) 0.5 % ophthalmic solution INSTILL 1 DROP INTO EACH EYE TWICE DAILY (Patient taking differently: Place 1 drop into both eyes 2 (two) times daily.) 10 mL 10  . UNIFINE PENTIPS 31G X 5 MM MISC USE TO INJECT INSULIN AT BEDTIME (Patient taking differently: at bedtime.) 100 each 5  . atorvastatin (LIPITOR) 80 MG tablet Take 1 tablet (80 mg total) by mouth daily. 90 tablet 3   No current facility-administered medications for this visit.    REVIEW OF SYSTEMS:  [X]  denotes positive finding, [ ]  denotes negative finding Cardiac  Comments:  Chest pain or chest pressure:    Shortness of breath upon exertion:    Short of breath when lying flat:    Irregular heart rhythm:        Vascular    Pain in calf, thigh, or hip brought on by ambulation:    Pain in feet at night that wakes you up from your sleep:     Blood clot in your veins:    Leg swelling:          Pulmonary    Oxygen at home:    Productive cough:     Wheezing:         Neurologic    Sudden weakness in arms or legs:     Sudden numbness in arms or legs:     Sudden onset of difficulty speaking or slurred speech:    Temporary loss of vision in one eye:     Problems with dizziness:         Gastrointestinal    Blood in stool:     Vomited blood:         Genitourinary    Burning when urinating:     Blood in urine:        Psychiatric    Major depression:         Hematologic    Bleeding problems:    Problems with blood clotting too easily:        Skin    Rashes or ulcers:        Constitutional    Fever or chills:      PHYSICAL EXAM:     Vitals:   03/21/20 1215 03/21/20 1220  BP: (!) 162/79 (!) 165/80  Pulse: 68 68  Resp: 16   Temp: 98 F (36.7 C)   TempSrc: Temporal   SpO2: 96%   Weight: 211 lb (95.7 kg)   Height: 5\' 11"  (1.803 m)     GENERAL: The patient is a well-nourished male, in no acute distress. The vital signs are documented above. CARDIAC: There is a regular rate and rhythm.  VASCULAR:  No wounds, feet warm PULMONARY: No respiratory distress. ABDOMEN: Soft and non-tender. MUSCULOSKELETAL: There are no major deformities or cyanosis. NEUROLOGIC: No focal weakness or paresthesias are detected.  CN II-XII grossly intact.  DATA:   ABIs today are 0.71 on the right monophasic and 0.99 on the left biphasic  Duplex today suggest high-grade bilateral ICA stenosis greater than 80%  Reviewed recent CTA neck 03/09/20 and appears to have high grade calcified stenosis at right carotid bifurcation with 70%+ stenosis on left as well  Assessment/Plan:  70 year old male who presents for 48-month interval follow-up of his carotid artery disease and peripheral vascular disease.  Unfortunately his carotid artery disease has had significant progression now consistent with  greater than 80% stenosis bilaterally by duplex criteria today.  The right ICA has a high-grade stenosis based on velocity criteria.  I am concerned about this lesion given he does describe an episode earlier this month where he had bilateral lower extremity weakness and ultimately left upper arm and leg weakness and went to [facility].  MRI was negative for stroke but I wonder if this could have been a TIA given the focal nature of his symptoms.  I have recommended right carotid endarterectomy for revascularization and stroke risk reduction.  He will need to hold his Eliquis 2 days prior to surgery.  I have asked that he start a 81 mg aspirin at least around the timeline of surgery.  I will send his cardiologist Dr. Harl Bowie a note to let him know, he denies any chest pain or dyspnea at this time.  Plan right carotid endarterectomy next Wednesday and risks and benefits discussed.   Xavier Heck, MD [facility] [place] [place] Office: [phone]

## 2020-03-29 NOTE — Anesthesia Postprocedure Evaluation (Signed)
Anesthesia Post Note  Patient: Xavier Caldwell  Procedure(s) Performed: RIGHT CAROTID ARTERY ENDARTERECTOMY WITH PATCH ANGIOPLASTY (Right Neck)     Patient location during evaluation: PACU Anesthesia Type: General Level of consciousness: awake and alert Pain management: pain level controlled Vital Signs Assessment: post-procedure vital signs reviewed and stable Respiratory status: spontaneous breathing, nonlabored ventilation, respiratory function stable and patient connected to nasal cannula oxygen Cardiovascular status: blood pressure returned to baseline and stable Postop Assessment: no apparent nausea or vomiting Anesthetic complications: no   No complications documented.  Last Vitals:  Vitals:   03/29/20 1140 03/29/20 1214  BP: (!) 133/57 128/60  Pulse: 69 66  Resp: 16   Temp: 36.4 C 36.5 C  SpO2: 95% 100%    Last Pain:  Vitals:   03/29/20 1214  TempSrc: Oral  PainSc:                  Adora Yeh S

## 2020-03-29 NOTE — Transfer of Care (Signed)
Immediate Anesthesia Transfer of Care Note  Patient: Xavier Caldwell  Procedure(s) Performed: RIGHT CAROTID ARTERY ENDARTERECTOMY WITH PATCH ANGIOPLASTY (Right Neck)  Patient Location: PACU  Anesthesia Type:General  Level of Consciousness: awake, alert  and oriented  Airway & Oxygen Therapy: Patient Spontanous Breathing and Patient connected to nasal cannula oxygen  Post-op Assessment: Report given to RN, Post -op Vital signs reviewed and stable, Patient moving all extremities and Patient able to stick tongue midline  Post vital signs: Reviewed and stable  Last Vitals:  Vitals Value Taken Time  BP 138/66 03/29/20 1030  Temp 36.7 C 03/29/20 1030  Pulse 74 03/29/20 1037  Resp 21 03/29/20 1036  SpO2 95 % 03/29/20 1037  Vitals shown include unvalidated device data.  Last Pain:  Vitals:   03/29/20 0620  TempSrc:   PainSc: 0-No pain      Patients Stated Pain Goal: 3 (26/37/85 8850)  Complications: No complications documented.

## 2020-03-29 NOTE — Progress Notes (Signed)
Pt arrived to rm 5 from PACU. Initiated tele, CHG wipe performed. VSS. Call bell within reach. Will continue to monitor the pt.   Lavenia Atlas, RN

## 2020-03-29 NOTE — Op Note (Addendum)
OPERATIVE NOTE  PROCEDURE:   1.  right carotid endarterectomy with bovine patch angioplasty 2.  right intraoperative carotid ultrasound  PRE-OPERATIVE DIAGNOSIS: right symptomatic high grade (>80%) carotid stenosis  POST-OPERATIVE DIAGNOSIS: same as above   SURGEON: Marty Heck, MD  ASSISTANT(S): Roxy Horseman, PA  ANESTHESIA: general  ESTIMATED BLOOD LOSS: <50 mL  FINDING(S): 1.  High grade calcified right carotid plaque. 2.  Continuous Doppler audible flow signatures are appropriate for each carotid artery after endarterectomy. 3.  No evidence of intimal flap visualized on transverse or longitudinal ultrasonography.   SPECIMEN(S):  Carotid plaque  INDICATIONS:   Xavier Caldwell is a 70 y.o. male who presents with right symptomatic high grade carotid stenosis >80% with recent TIA.  I discussed with the patient the risks, benefits, and alternatives to carotid endarterectomy.   I discussed the procedural details of carotid endarterectomy with the patient.  The patient is aware that the risks of carotid endarterectomy include but are not limited to: bleeding, infection, stroke, myocardial infarction, death, cranial nerve injuries both temporary and permanent, neck hematoma, possible airway compromise, labile blood pressure post-operatively, cerebral hyperperfusion syndrome, and possible need for additional interventions in the future. The patient is aware of the risks and agrees to proceed forward with the procedure.  An assistant was needed for exposure and to expedite the case.  DESCRIPTION: After full informed written consent was obtained from the patient, the patient was brought back to the operating room and placed supine upon the operating table.  Prior to induction, the patient received IV antibiotics.  After obtaining adequate anesthesia, the patient was placed into semi-Fowler position with a shoulder roll in place and the patient's neck slightly hyperextended  and rotated away from the surgical site.  The patient was prepped in the standard fashion for a right carotid endarterectomy.  I made an incision anterior to the sternocleidomastoid muscle and dissected down through the subcutaneous tissue.  The platysma was opened with electrocautery.  I then used Bovie cautery and blunt dissection to dissect through the underlying platysma and to mobilize the anterior border of the sternocleidomastoid as well as the internal jugular vein laterally.  The facial vein was ligated with 3-0 silk and surgical clips and divided.  After identifying the carotid artery I used Metzenbaum scissors to bluntly dissect the common carotid artery and then controlled this with both a large vessel loop and a umbilical tape.  At this point in time the patient was given 100 units/kg of IV heparin and we checked an ACT to ensure it was greater than 250.  I then carried my dissection cephalad and mobilized the external carotid artery and superior thyroid artery and controlled each of these with a vessel loop.  I then dissected out the internal carotid artery well past the distal plaque.  The internal carotid artery was then controlled with a umbilical tape as well. I was careful to identify the vagus nerve between the internal jugular and common carotid and this was presereved.  I was also careful to identify and preserve the hypoglossal nerve and this was preserved.    Once our ACT was confirmed, I proceeded by clamping the internal carotid artery with a angled bulldog clamp first.  The proximal common carotid artery was controlled with a angled debakey clamp.  The external carotid was controlled with a vessel loop.  I subsequently opened the common carotid artery with an 11 blade scalpel in longitudinal fashion and extended the arteriotomy  with Potts scissors onto the ICA past the distal plaque.  I then used a Garment/textile technologist and performed a endarterectomy starting in the common carotid artery.  The  external carotid artery was endarterectomized with an eversion technique and I was careful to feather the distal ICA plaque.  The specimen was passed off the field.  The endarterectomy site was then flushed with heparinized saline and I was careful to ensure there were no flaps in the endarterectomy site. At this point a 10 [place] shunt was brought to the field and then initially placed distally into the ICA after removing the clamp and controlled with a umbilical tape. The shunt was back bleed with good flow.  I then placed the proximal end of the shunt in the common carotid artery and controlled this with a Rummel tourniquet.  I then brought a bovine carotid patch on the field and this was sewn in place with a running anastomosis using a 6-0 Prolene distally and a 5-0 proximal.  The bovine patch was trimmed accordingly.  The shunt was removed just before completion of the patch.  The artery was flushed antegrade and retrograde prior to completion of the patch.  Once the patch was complete, I flushed up the external carotid artery first prior to releasing the internal carotid artery clamp.  An intraoperative duplex was performed that showed no evidence of any flaps.  There was good doppler flow in the ICA and ECA.  Once I was happy with the intraoperative ultrasound the patient was given protamine for reversal.  I used surgicel snow to get hemostasis around the patch.  Ultimately the platysma was closed in running fashion with 3-0 Vicryl.  The skin was closed with a running 4-0 Monocryl.  Dermabond was applied with a dry sterile dressing.  The patient was awakened from anesthesia with no new neurological deficit and taken to PACU in stable condition.    COMPLICATIONS: None  CONDITION: Stable  Marty Heck, MD [facility]: [phone]  03/29/2020, 10:04 AM

## 2020-03-30 ENCOUNTER — Telehealth: Payer: Self-pay

## 2020-03-30 ENCOUNTER — Encounter (HOSPITAL_COMMUNITY): Payer: Self-pay | Admitting: Vascular Surgery

## 2020-03-30 LAB — GLUCOSE, CAPILLARY
Glucose-Capillary: 151 mg/dL — ABNORMAL HIGH (ref 70–99)
Glucose-Capillary: 185 mg/dL — ABNORMAL HIGH (ref 70–99)

## 2020-03-30 LAB — BASIC METABOLIC PANEL
Anion gap: 9 (ref 5–15)
BUN: 22 mg/dL (ref 8–23)
CO2: 21 mmol/L — ABNORMAL LOW (ref 22–32)
Calcium: 8.6 mg/dL — ABNORMAL LOW (ref 8.9–10.3)
Chloride: 107 mmol/L (ref 98–111)
Creatinine, Ser: 1.47 mg/dL — ABNORMAL HIGH (ref 0.61–1.24)
GFR, Estimated: 52 mL/min — ABNORMAL LOW (ref >60)
Glucose, Bld: 157 mg/dL — ABNORMAL HIGH (ref 70–99)
Potassium: 4.2 mmol/L (ref 3.5–5.1)
Sodium: 137 mmol/L (ref 135–145)

## 2020-03-30 LAB — CBC
HCT: 26.8 % — ABNORMAL LOW (ref 39.0–52.0)
Hemoglobin: 8.9 g/dL — ABNORMAL LOW (ref 13.0–17.0)
MCH: 29.3 pg (ref 26.0–34.0)
MCHC: 33.2 g/dL (ref 30.0–36.0)
MCV: 88.2 fL (ref 80.0–100.0)
Platelets: 227 10*3/uL (ref 150–400)
RBC: 3.04 MIL/uL — ABNORMAL LOW (ref 4.22–5.81)
RDW: 13.2 % (ref 11.5–15.5)
WBC: 11.1 10*3/uL — ABNORMAL HIGH (ref 4.0–10.5)
nRBC: 0 % (ref 0.0–0.2)

## 2020-03-30 MED ORDER — OXYCODONE-ACETAMINOPHEN 5-325 MG PO TABS: 1.0000 | ORAL_TABLET | ORAL | 0 refills | Status: DC | PRN

## 2020-03-30 MED ORDER — APIXABAN 5 MG PO TABS: 5.0000 mg | ORAL_TABLET | Freq: Two times a day (BID) | ORAL | 6 refills | Status: DC

## 2020-03-30 NOTE — Telephone Encounter (Signed)
Pt called to r/s his post op appt for next week which is one week postop. Per d/c note from MD, pt is to return for f/u 1-2 weeks post op and pt is wanting to come 1 week after surgery. Pt is aware of appt date/time change. No questions/ concerns at this time.

## 2020-03-30 NOTE — Discharge Summary (Signed)
Carotid Discharge Summary     Xavier Caldwell March 19, 1950 70 y.o. male  [id number]  Admission Date: 03/29/2020  Discharge Date: 03/30/2020  Physician: Marty Heck, MD  Admission Diagnosis: Carotid stenosis [I65.29]  Discharge Day services:      Hospital Course:  The patient was admitted to the hospital and taken to the operating room on 03/29/2020 and underwent right carotid endarterectomy.  The pt tolerated the procedure well and was transported to the PACU in excellent condition.   By POD 1, the pt neuro status intact. He remained hemodynamically stable and pain was controlled. His incision was without bleeding or hematoma.  He was tolerating his diet and voiding spontaneously.  The remainder of the hospital course consisted of increasing mobilization and increasing intake of solids without difficulty.   Recent Labs    03/30/20 0548  NA 137  K 4.2  CL 107  CO2 21*  GLUCOSE 157*  BUN 22  CALCIUM 8.6*   Recent Labs    03/29/20 1314 03/30/20 0548  WBC 10.7* 11.1*  HGB 9.9* 8.9*  HCT 30.4* 26.8*  PLT 242 227   No results for input(s): INR in the last 72 hours.   Discharge Instructions    Discharge patient   Complete by: As directed    Discharge disposition: 01-Home or Self Care   Discharge patient date: 03/30/2020      Discharge Diagnosis:  Carotid stenosis [I65.29]  Secondary Diagnosis: Patient Active Problem List   Diagnosis Date Noted  . Hyperkalemia 03/09/2020  . History of colonic polyps 10/05/2019  . Chronic anticoagulation 10/05/2019  . Controlled type 2 diabetes mellitus with stable proliferative retinopathy of both eyes, with long-term current use of insulin ([place]) 06/14/2019  . Left posterior capsular opacification 06/14/2019  . Right posterior capsular opacification 06/14/2019  . Primary open angle glaucoma of left eye, mild stage 06/14/2019  . PVD (peripheral vascular disease) ([place]) 06/08/2019  . Carotid stenosis 04/06/2019  .  Mixed hyperlipidemia 04/24/2018  . Status post coronary artery stent placement   . Paroxysmal atrial fibrillation (HCC)   . Hematoma of groin 04/09/2018  . Symptomatic bradycardia - - ischemic, s/p TPM 04/08/2018  . Coronary artery disease involving native coronary artery of native heart with unstable angina pectoris ([place]) 04/08/2018  . Presence of drug coated stent in right coronary artery 04/08/2018  . Ventricular tachycardia ([place]) 04/08/2018  . Acute ST elevation myocardial infarction (STEMI) of inferior wall (HCC)   . Cervicalgia 04/16/2017  . Acute pain of left shoulder 04/16/2017  . Hemorrhoids 11/18/2016  . Taking multiple medications for chronic disease 09/18/2016  . Constipation 09/18/2016  . Uncontrolled type 2 diabetes mellitus with retinopathy, with long-term current use of insulin ([place]) 01/09/2016  . Uncontrolled type 2 diabetes mellitus with complication, with long-term current use of insulin ([place]) 12/19/2014  . Hyperlipidemia associated with type 2 diabetes mellitus ([place]) 12/19/2014  . Essential hypertension, benign 12/19/2014   Past Medical History:  Diagnosis Date  . Acute systolic (congestive) heart failure ([place])   . Anxiety   . Arthritis   . Atrial fibrillation ([place])   . Back pain   . Chronic kidney disease    pt told by PA of Dr. [name] that he had "stage 3 kidney so they had me stop taking Metformin"  . Coronary artery disease   . Diabetes mellitus without complication ([place])   . High cholesterol   . History of gout   . Hypertension   . Myocardial infarction ([place])  04/2018  . Neuropathy   . PAD (peripheral artery disease) ([place])   . 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 Sleep apnea    Stop Bang score of 4  . STEMI (ST elevation myocardial infarction) ([place])    04/08/2018 PCI/DES RCA  . VT (ventricular tachycardia) (HCC)     Allergies as of 03/30/2020   No Known Allergies     Medication List    STOP taking these medications    HYDROcodone-acetaminophen 5-325 MG tablet Commonly known as: NORCO/VICODIN     TAKE these medications   ALPRAZolam 0.5 MG tablet Commonly known as: XANAX Take 0.5 mg by mouth 2 (two) times daily as needed for anxiety.   amLODipine 10 MG tablet Commonly known as: NORVASC Take 1 tablet (10 mg total) by mouth daily.   apixaban 5 MG Tabs tablet Commonly known as: Eliquis Take 1 tablet (5 mg total) by mouth 2 (two) times daily. Restart medication tomorrow, 03/31/2020 What changed: additional instructions   atorvastatin 80 MG tablet Commonly known as: LIPITOR Take 1 tablet (80 mg total) by mouth daily.   Basaglar KwikPen 100 UNIT/ML Inject 30 Units into the skin at bedtime.   carvedilol 12.5 MG tablet Commonly known as: COREG Take 1 tablet (12.5 mg total) by mouth 2 (two) times daily.   DULoxetine 60 MG capsule Commonly known as: CYMBALTA Take 60 mg by mouth daily with lunch.   ergocalciferol 1.25 MG (50000 UT) capsule Commonly known as: VITAMIN D2 Take 50,000 Units by mouth every Sunday.   gabapentin 300 MG capsule Commonly known as: NEURONTIN Take 300 mg by mouth 2 (two) times daily.   hydrALAZINE 25 MG tablet Commonly known as: APRESOLINE Take 1 tablet (25 mg total) by mouth 3 (three) times daily.   linaclotide 145 MCG Caps capsule Commonly known as: Linzess Take 1 capsule (145 mcg total) by mouth daily before breakfast.   nitroGLYCERIN 0.4 MG SL tablet Commonly known as: Nitrostat Place 1 tablet (0.4 mg total) under the tongue every 5 (five) minutes as needed. What changed: reasons to take this   OneTouch Ultra test strip Generic drug: glucose blood USE ONE TEST STRIP AS DIRECTED TWICE DAILY   oxyCODONE-acetaminophen 5-325 MG tablet Commonly known as: PERCOCET/ROXICET Take 1 tablet by mouth every 4 (four) hours as needed for moderate pain.   rOPINIRole 1 MG tablet Commonly known as: REQUIP Take 1 mg by mouth 4 (four) times daily.   SUPER B COMPLEX  PO Take 1 tablet by mouth daily.   timolol 0.5 % ophthalmic solution Commonly known as: TIMOPTIC INSTILL 1 DROP INTO EACH EYE TWICE DAILY What changed: See the new instructions.   Unifine Pentips 31G X 5 MM Misc Generic drug: Insulin Pen Needle USE TO INJECT INSULIN AT BEDTIME What changed: See the new instructions.        Discharge Instructions:   [facility] Discharge Instructions Carotid Endarterectomy (CEA)  Please refer to the following instructions for your post-procedure care. Your surgeon or physician assistant will discuss any changes with you.  Activity  You are encouraged to walk as much as you can. You can slowly return to normal activities but must avoid strenuous activity and heavy lifting until your doctor tell you it's OK. Avoid activities such as vacuuming or swinging a golf club. You can drive after one week if you are comfortable and you are no longer taking prescription pain medications. It  is normal to feel tired for serval weeks after your surgery. It is also normal to have difficulty with sleep habits, eating, and bowel movements after surgery. These will go away with time.  Bathing/Showering  You may shower after you come home. Do not soak in a bathtub, hot tub, or swim until the incision heals completely.  Incision Care  Shower every day. Clean your incision with mild soap and water. Pat the area dry with a clean towel. You do not need a bandage unless otherwise instructed. Do not apply any ointments or creams to your incision. You may have skin glue on your incision. Do not peel it off. It will come off on its own in about one week. Your incision may feel thickened and raised for several weeks after your surgery. This is normal and the skin will soften over time. For Men Only: It's OK to shave around the incision but do not shave the incision itself for 2 weeks. It is common to have numbness under your chin that could last  for several months.  Diet  Resume your normal diet. There are no special food restrictions following this procedure. A low fat/low cholesterol diet is recommended for all patients with vascular disease. In order to heal from your surgery, it is CRITICAL to get adequate nutrition. Your body requires vitamins, minerals, and protein. Vegetables are the best source of vitamins and minerals. Vegetables also provide the perfect balance of protein. Processed food has little nutritional value, so try to avoid this.  Medications  Resume taking all of your medications unless your doctor or physician assistant tells you not to.  If your incision is causing pain, you may take over-the- counter pain relievers such as acetaminophen (Tylenol). If you were prescribed a stronger pain medication, please be aware these medications can cause nausea and constipation.  Prevent nausea by taking the medication with a snack or meal. Avoid constipation by drinking plenty of fluids and eating foods with a high amount of fiber, such as fruits, vegetables, and grains. Do not take Tylenol if you are taking prescription pain medications.  Follow Up  Our office will schedule a follow up appointment 2-3 weeks following discharge.  Please call us immediately for any of the following conditions  . Increased pain, redness, drainage (pus) from your incision site. . Fever of 101 degrees or higher. . If you should develop stroke (slurred speech, difficulty swallowing, weakness on one side of your body, loss of vision) you should call 911 and go to the nearest emergency room. .  Reduce your risk of vascular disease:  . Stop smoking. If you would like help call [facility] at 1-800-QUIT-NOW (217)630-7622) or [place] at (260)541-2889. . Manage your cholesterol . Maintain a desired weight . Control your diabetes . Keep your blood pressure down .  If you have any questions, please call the office at  [phone].  Prescriptions given: percocet #15 No Refill  Disposition: home  Patient's condition: is Excellent  Follow up: 1. Dr. Carlis Abbott in 2 weeks.   Barbie Banner PA-C [facility] [phone]   --- For [facility] Registry use ---   Modified Rankin score at D/C (0-6): 0  IV medication needed for:  1. Hypertension: No 2. Hypotension: No  Post-op Complications: No  1. Post-op CVA or TIA: No  If yes: Event classification (right eye, left eye, right cortical, left cortical, verterobasilar, other):   If yes: Timing of event (intra-op, <6 hrs post-op, >=6 hrs  post-op, unknown):   2. CN injury: No  If yes: CN  injuried   3. Myocardial infarction: No  If yes: Dx by (EKG or clinical, Troponin):   4.  CHF: No  5.  Dysrhythmia (new): No  6. Wound infection: No  7. Reperfusion symptoms: No  8. Return to OR: No  If yes: return to OR for (bleeding, neurologic, other CEA incision, other):   Discharge medications: Statin use:  Yes ASA use:  No   Beta blocker use:  Yes ACE-Inhibitor use:  No  ARB use:  No CCB use: Yes P2Y12 Antagonist use: No, [ ]  Plavix, [ ]  Plasugrel, [ ]  Ticlopinine, [ ]  Ticagrelor, [ ]  Other, [ ]  No for medical reason, [ ]  Non-compliant, [ ]  Not-indicated Anti-coagulant use:  Yes, [ ]  Warfarin, [ ]  Rivaroxaban, [ ]  Dabigatran,  apixaban (restart on 03/31/2020)

## 2020-03-30 NOTE — Progress Notes (Addendum)
Progress Note    03/30/2020 7:34 AM 1 Day Post-Op  Subjective:  No complaints. Did not require BP support. Tolerating diet and voiding spontaneously.   Vitals:   03/29/20 2319 03/30/20 0358  BP: 129/61 140/64  Pulse: 68 70  Resp: 20 20  Temp: 98.5 F (36.9 C) 98.2 F (36.8 C)  SpO2: 95% 96%    Physical Exam: General appearance: Awake, alert in no apparent distress Cardiac: Heart rate and rhythm are regular Respirations: Nonlabored Incisions: right neck incision is well approximated without bleeding or hematoma Extremities: Moves all well.  Neuro: A and O times 4. Tongue midline. Face symmetric. Speech fluent.    CBC    Component Value Date/Time   WBC 10.7 (H) 03/29/2020 1314   RBC 3.39 (L) 03/29/2020 1314   HGB 9.9 (L) 03/29/2020 1314   HCT 30.4 (L) 03/29/2020 1314   PLT 242 03/29/2020 1314   MCV 89.7 03/29/2020 1314   MCH 29.2 03/29/2020 1314   MCHC 32.6 03/29/2020 1314   RDW 13.1 03/29/2020 1314   LYMPHSABS 1.4 03/09/2020 1350   MONOABS 0.6 03/09/2020 1350   EOSABS 0.2 03/09/2020 1350   BASOSABS 0.1 03/09/2020 1350    BMET    Component Value Date/Time   NA 136 03/23/2020 1055   NA 137 02/29/2020 1022   K 4.9 03/23/2020 1055   CL 104 03/23/2020 1055   CO2 25 03/23/2020 1055   GLUCOSE 127 (H) 03/23/2020 1055   BUN 28 (H) 03/23/2020 1055   BUN 37 (H) 02/29/2020 1022   CREATININE 1.37 (H) 03/29/2020 1314   CREATININE 0.83 04/01/2016 0843   CALCIUM 9.2 03/23/2020 1055   GFRNONAA 57 (L) 03/29/2020 1314   GFRAA 35 (L) 02/29/2020 1022     Intake/Output Summary (Last 24 hours) at 03/30/2020 0734 Last data filed at 03/30/2020 0404 Gross per 24 hour  Intake 1659.53 ml  Output 2375 ml  Net -715.47 ml    HOSPITAL MEDICATIONS Scheduled Meds: . amLODipine  10 mg Oral Daily  . aspirin EC  81 mg Oral Q0600  . atorvastatin  80 mg Oral Daily  . carvedilol  12.5 mg Oral BID  . docusate sodium  100 mg Oral Daily  . DULoxetine  60 mg Oral Q lunch  .  gabapentin  300 mg Oral BID  . heparin  5,000 Units Subcutaneous Q8H  . hydrALAZINE  25 mg Oral TID  . insulin aspart  0-15 Units Subcutaneous TID WC  . insulin glargine  30 Units Subcutaneous QHS  . linaclotide  145 mcg Oral QAC breakfast  . pantoprazole  40 mg Oral Daily  . rOPINIRole  1 mg Oral QID  . timolol  1 drop Both Eyes BID   Continuous Infusions: . sodium chloride    . sodium chloride 75 mL/hr at 03/30/20 0110  . magnesium sulfate bolus IVPB     PRN Meds:.sodium chloride, acetaminophen **OR** acetaminophen, ALPRAZolam, alum & mag hydroxide-simeth, bisacodyl, guaiFENesin-dextromethorphan, hydrALAZINE, HYDROmorphone (DILAUDID) injection, labetalol, magnesium sulfate bolus IVPB, metoprolol tartrate, nitroGLYCERIN, ondansetron, oxyCODONE-acetaminophen, phenol, potassium chloride, senna-docusate  Assessment and Plan: POD 1 right CEA. Hemodynamically stable and neuro intact. Ambulate this morning then DC home. Acute BLA. No indication for transfusion.  -DVT prophylaxis:  Heparin Half[place]   Risa Grill, PA-C [facility] [phone] 03/30/2020  7:34 AM   I have seen and evaluated the patient. I agree with the PA note as documented above. POD#1 s/p right CEA for symptomatic high grade stenosis.  Looks good  this am.  Neck incision c/d/i.  Neuro intact.  Plan d/c home today.  Restart DOAC tomorrow.  F/U arranged with me in 1-2 weeks.  Marty Heck, MD [facility] [place] Office: [phone]

## 2020-04-01 DIAGNOSIS — Z1211 Encounter for screening for malignant neoplasm of colon: Secondary | ICD-10-CM | POA: Diagnosis not present

## 2020-04-03 ENCOUNTER — Encounter (HOSPITAL_COMMUNITY): Payer: Self-pay | Admitting: Vascular Surgery

## 2020-04-03 DIAGNOSIS — I5023 Acute on chronic systolic (congestive) heart failure: Secondary | ICD-10-CM | POA: Diagnosis not present

## 2020-04-03 DIAGNOSIS — I5021 Acute systolic (congestive) heart failure: Secondary | ICD-10-CM | POA: Diagnosis not present

## 2020-04-03 DIAGNOSIS — E7849 Other hyperlipidemia: Secondary | ICD-10-CM | POA: Diagnosis not present

## 2020-04-03 DIAGNOSIS — N189 Chronic kidney disease, unspecified: Secondary | ICD-10-CM | POA: Diagnosis not present

## 2020-04-03 DIAGNOSIS — I130 Hypertensive heart and chronic kidney disease with heart failure and stage 1 through stage 4 chronic kidney disease, or unspecified chronic kidney disease: Secondary | ICD-10-CM | POA: Diagnosis not present

## 2020-04-03 DIAGNOSIS — E1122 Type 2 diabetes mellitus with diabetic chronic kidney disease: Secondary | ICD-10-CM | POA: Diagnosis not present

## 2020-04-03 DIAGNOSIS — N181 Chronic kidney disease, stage 1: Secondary | ICD-10-CM | POA: Diagnosis not present

## 2020-04-04 ENCOUNTER — Encounter: Payer: Self-pay | Admitting: Vascular Surgery

## 2020-04-04 ENCOUNTER — Encounter: Payer: Medicare HMO | Admitting: Vascular Surgery

## 2020-04-04 ENCOUNTER — Other Ambulatory Visit: Payer: Self-pay

## 2020-04-04 ENCOUNTER — Ambulatory Visit (INDEPENDENT_AMBULATORY_CARE_PROVIDER_SITE_OTHER): Payer: Medicare HMO | Admitting: Vascular Surgery

## 2020-04-04 VITALS — BP 147/77 | HR 63 | Temp 98.2°F | Resp 16 | Ht 71.0 in | Wt 208.0 lb

## 2020-04-04 DIAGNOSIS — I6523 Occlusion and stenosis of bilateral carotid arteries: Secondary | ICD-10-CM

## 2020-04-04 NOTE — Progress Notes (Signed)
Patient name: Xavier Caldwell MRN: [id number] DOB: 1950/05/18 Sex: male  REASON FOR VISIT: Postop check after right carotid endarterectomy  HPI: Xavier Caldwell is a 70 y.o. male with multiple medical comorbidities as listed below that presents for postop check after right carotid endarterectomy on 03/29/2020 for symptomatic high-grade stenosis.  Prior to surgery he had presented to the [facility] ED with some vague lower extremity weakness that progressed to left upper and lower extremity weakness concerning for TIA.  CTA neck and duplex suggested a high-grade right ICA stenosis.  He also has a high-grade contralateral left ICA stenosis greater than 80% by duplex that is asymptomatic.  Reports he has done great at home.  Has had no new neurologic events.  No concerns with the incision.  He is back on his blood thinner for A. fib.  Past Medical History:  Diagnosis Date   Acute systolic (congestive) heart failure (HCC)    Anxiety    Arthritis    Atrial fibrillation (HCC)    Back pain    Chronic kidney disease    pt told by PA of Dr. [name] that he had "stage 3 kidney so they had me stop taking Metformin"   Coronary artery disease    Diabetes mellitus without complication (HCC)    High cholesterol    History of gout    Hypertension    Myocardial infarction [facility]) 04/2018   Neuropathy    PAD (peripheral artery disease) (HCC)    Retinal detachment    One on the right and two on the left   Sleep apnea    Stop Bang score of 4   STEMI (ST elevation myocardial infarction) ([place])    04/08/2018 PCI/DES RCA   VT (ventricular tachycardia) ([place])     Past Surgical History:  Procedure Laterality Date   APPENDECTOMY     CARDIOVERSION N/A 04/13/2018   Procedure: CARDIOVERSION;  Surgeon: Skeet Latch, MD;  Location: [place];  Service: Cardiovascular;  Laterality: N/A;   CATARACT EXTRACTION W/PHACO Right 04/25/2014   Procedure: CATARACT EXTRACTION PHACO AND INTRAOCULAR LENS  PLACEMENT ([place]);  Surgeon: Williams Che, MD;  Location: [facility];  Service: Ophthalmology;  Laterality: Right;  CDE:4.70   CATARACT EXTRACTION W/PHACO Left 07/11/2014   Procedure: CATARACT EXTRACTION PHACO AND INTRAOCULAR LENS PLACEMENT LEFT EYE CDE=8.32;  Surgeon: Williams Che, MD;  Location: [facility];  Service: Ophthalmology;  Laterality: Left;   COLONOSCOPY WITH PROPOFOL N/A 09/23/2016   [place]: 9 polyps removed, multiple tubular adenomas. recommended 3 yr surveillance   COLONOSCOPY WITH PROPOFOL N/A 12/27/2019   ten sessile polyps, 4-9 mm in size in descending, hepatic flexure, and cecum, 11 mm polyp in sigmoid, three sessile polyps in rectum and mid rectum 2-3 mm in size. Tubular adenomas and hyperplastic. 3 year surveillance   CORONARY/GRAFT ACUTE MI REVASCULARIZATION N/A 04/07/2018   Procedure: Coronary/Graft Acute MI Revascularization;  Surgeon: Burnell Blanks, MD;  Location: [place] CV [facility];  Service: Cardiovascular;  Laterality: N/A;   ENDARTERECTOMY Right 03/29/2020   Procedure: RIGHT CAROTID ARTERY ENDARTERECTOMY WITH PATCH ANGIOPLASTY;  Surgeon: Marty Heck, MD;  Location: [place];  Service: Vascular;  Laterality: Right;   LEFT HEART CATH AND CORONARY ANGIOGRAPHY N/A 04/07/2018   Procedure: LEFT HEART CATH AND CORONARY ANGIOGRAPHY;  Surgeon: Burnell Blanks, MD;  Location: [place] [facility];  Service: Cardiovascular;  Laterality: N/A;   MELANOMA EXCISION  07/2019   POLYPECTOMY  09/23/2016   Procedure: POLYPECTOMY;  Surgeon:  Daneil Dolin, MD;  [facility];  Service: Endoscopy;;  colon   POLYPECTOMY  12/27/2019   Procedure: POLYPECTOMY;  Surgeon: Daneil Dolin, MD;  [facility];  Service: Endoscopy;;   RETINAL DETACHMENT SURGERY      Family History  Problem Relation Age of Onset   Hypertension Father    Colon cancer Neg Hx     SOCIAL HISTORY: Social History   Tobacco Use   Smoking status: Former Smoker     Packs/day: 1.50    Years: 30.00    Pack years: 45.00    Types: Cigarettes    Quit date: 07/05/2012    Years since quitting: 7.7   Smokeless tobacco: Never Used  Substance Use Topics   Alcohol use: No    No Known Allergies  Current Outpatient Medications  Medication Sig Dispense Refill   ALPRAZolam (XANAX) 0.5 MG tablet Take 0.5 mg by mouth 2 (two) times daily as needed for anxiety.     amLODipine (NORVASC) 10 MG tablet Take 1 tablet (10 mg total) by mouth daily. 30 tablet 1   apixaban (ELIQUIS) 5 MG TABS tablet Take 1 tablet (5 mg total) by mouth 2 (two) times daily. Restart medication tomorrow, 03/31/2020 60 tablet 6   B Complex-C (SUPER B COMPLEX PO) Take 1 tablet by mouth daily.      DULoxetine (CYMBALTA) 60 MG capsule Take 60 mg by mouth daily with lunch.      ergocalciferol (VITAMIN D2) 50000 units capsule Take 50,000 Units by mouth every Sunday.      gabapentin (NEURONTIN) 300 MG capsule Take 300 mg by mouth 2 (two) times daily.      glucose blood (ONETOUCH ULTRA) test strip USE ONE TEST STRIP AS DIRECTED TWICE DAILY 100 each 0   hydrALAZINE (APRESOLINE) 25 MG tablet Take 1 tablet (25 mg total) by mouth 3 (three) times daily. 90 tablet 0   Insulin Glargine (BASAGLAR KWIKPEN) 100 UNIT/ML Inject 30 Units into the skin at bedtime. 30 mL 3   linaclotide (LINZESS) 145 MCG CAPS capsule Take 1 capsule (145 mcg total) by mouth daily before breakfast. 30 capsule 3   nitroGLYCERIN (NITROSTAT) 0.4 MG SL tablet Place 1 tablet (0.4 mg total) under the tongue every 5 (five) minutes as needed. (Patient taking differently: Place 0.4 mg under the tongue every 5 (five) minutes as needed for chest pain.) 25 tablet 2   rOPINIRole (REQUIP) 1 MG tablet Take 1 mg by mouth 4 (four) times daily.      timolol (TIMOPTIC) 0.5 % ophthalmic solution INSTILL 1 DROP INTO EACH EYE TWICE DAILY (Patient taking differently: Place 1 drop into both eyes 2 (two) times daily.) 10 mL 10   UNIFINE PENTIPS 31G  X 5 MM MISC USE TO INJECT INSULIN AT BEDTIME (Patient taking differently: at bedtime.) 100 each 5   atorvastatin (LIPITOR) 80 MG tablet Take 1 tablet (80 mg total) by mouth daily. 90 tablet 3   carvedilol (COREG) 12.5 MG tablet Take 1 tablet (12.5 mg total) by mouth 2 (two) times daily. 180 tablet 1   oxyCODONE-acetaminophen (PERCOCET/ROXICET) 5-325 MG tablet Take 1 tablet by mouth every 4 (four) hours as needed for moderate pain. (Patient not taking: Reported on 04/04/2020) 15 tablet 0   No current facility-administered medications for this visit.    REVIEW OF SYSTEMS:  [X]  denotes positive finding, [ ]  denotes negative finding Cardiac  Comments:  Chest pain or chest pressure:    Shortness of  breath upon exertion:    Short of breath when lying flat:    Irregular heart rhythm:        Vascular    Pain in calf, thigh, or hip brought on by ambulation:    Pain in feet at night that wakes you up from your sleep:     Blood clot in your veins:    Leg swelling:         Pulmonary    Oxygen at home:    Productive cough:     Wheezing:         Neurologic    Sudden weakness in arms or legs:     Sudden numbness in arms or legs:     Sudden onset of difficulty speaking or slurred speech:    Temporary loss of vision in one eye:     Problems with dizziness:         Gastrointestinal    Blood in stool:     Vomited blood:         Genitourinary    Burning when urinating:     Blood in urine:        Psychiatric    Major depression:         Hematologic    Bleeding problems:    Problems with blood clotting too easily:        Skin    Rashes or ulcers:        Constitutional    Fever or chills:      PHYSICAL EXAM: Vitals:   04/04/20 0950 04/04/20 0956  BP: (!) 153/82 (!) 147/77  Pulse: 63 63  Resp: 16   Temp: 98.2 F (36.8 C)   TempSrc: Temporal   SpO2: 98%   Weight: 208 lb (94.3 kg)   Height: 5\' 11"  (1.803 m)     GENERAL: The patient is a well-nourished male, in no acute  distress. The vital signs are documented above. CARDIAC: There is a regular rate and rhythm.  VASCULAR:  Right neck incision healing without issue MUSCULOSKELETAL: There are no major deformities or cyanosis. NEUROLOGIC: No focal weakness or paresthesias are detected.  Nerves II through XII grossly intact. SKIN: There are no ulcers or rashes noted. PSYCHIATRIC: The patient has a normal affect.  DATA:   Previous duplex 03/21/2020 does show a contralateral left ICA high-grade stenosis with velocity 480/121 consistent with greater than 80% stenosis  Assessment/Plan:  70 year old male status post right carotid endarterectomy on 03/29/2020 for symptomatic high-grade stenosis.  He is doing very well today and seems to be making excellent progress and is neurologically intact and at his baseline.  He would like to return to work without restrictions.  We discussed that the right side was felt to be the symptomatic lesion that we have now treated surgically.  He does need surgery on the left carotid given an asymptomatic high-grade stenosis by velocity criteria.  We ultimately agreed that we would see each other again in 3 months in the office and assuming he is making good progress we will decide on a surgery date at that time for his left carotid.  Will call with questions or concerns.   Marty Heck, MD [facility] [place] Office: [phone]

## 2020-04-07 DIAGNOSIS — R69 Illness, unspecified: Secondary | ICD-10-CM | POA: Diagnosis not present

## 2020-04-07 DIAGNOSIS — Z794 Long term (current) use of insulin: Secondary | ICD-10-CM | POA: Diagnosis not present

## 2020-04-07 DIAGNOSIS — I509 Heart failure, unspecified: Secondary | ICD-10-CM | POA: Diagnosis not present

## 2020-04-07 DIAGNOSIS — I4891 Unspecified atrial fibrillation: Secondary | ICD-10-CM | POA: Diagnosis not present

## 2020-04-07 DIAGNOSIS — I110 Hypertensive heart disease with heart failure: Secondary | ICD-10-CM | POA: Diagnosis not present

## 2020-04-07 DIAGNOSIS — E1142 Type 2 diabetes mellitus with diabetic polyneuropathy: Secondary | ICD-10-CM | POA: Diagnosis not present

## 2020-04-07 DIAGNOSIS — D6869 Other thrombophilia: Secondary | ICD-10-CM | POA: Diagnosis not present

## 2020-04-07 DIAGNOSIS — I25119 Atherosclerotic heart disease of native coronary artery with unspecified angina pectoris: Secondary | ICD-10-CM | POA: Diagnosis not present

## 2020-04-07 DIAGNOSIS — E785 Hyperlipidemia, unspecified: Secondary | ICD-10-CM | POA: Diagnosis not present

## 2020-04-11 DIAGNOSIS — Z08 Encounter for follow-up examination after completed treatment for malignant neoplasm: Secondary | ICD-10-CM | POA: Diagnosis not present

## 2020-04-11 DIAGNOSIS — D225 Melanocytic nevi of trunk: Secondary | ICD-10-CM | POA: Diagnosis not present

## 2020-04-11 DIAGNOSIS — D485 Neoplasm of uncertain behavior of skin: Secondary | ICD-10-CM | POA: Diagnosis not present

## 2020-04-11 DIAGNOSIS — Z85820 Personal history of malignant melanoma of skin: Secondary | ICD-10-CM | POA: Diagnosis not present

## 2020-04-11 DIAGNOSIS — Z1283 Encounter for screening for malignant neoplasm of skin: Secondary | ICD-10-CM | POA: Diagnosis not present

## 2020-05-03 DIAGNOSIS — E1122 Type 2 diabetes mellitus with diabetic chronic kidney disease: Secondary | ICD-10-CM | POA: Diagnosis not present

## 2020-05-03 DIAGNOSIS — I5023 Acute on chronic systolic (congestive) heart failure: Secondary | ICD-10-CM | POA: Diagnosis not present

## 2020-05-03 DIAGNOSIS — E7849 Other hyperlipidemia: Secondary | ICD-10-CM | POA: Diagnosis not present

## 2020-05-03 DIAGNOSIS — I130 Hypertensive heart and chronic kidney disease with heart failure and stage 1 through stage 4 chronic kidney disease, or unspecified chronic kidney disease: Secondary | ICD-10-CM | POA: Diagnosis not present

## 2020-05-24 DIAGNOSIS — E1165 Type 2 diabetes mellitus with hyperglycemia: Secondary | ICD-10-CM | POA: Diagnosis not present

## 2020-05-24 DIAGNOSIS — Z1331 Encounter for screening for depression: Secondary | ICD-10-CM | POA: Diagnosis not present

## 2020-05-24 DIAGNOSIS — G894 Chronic pain syndrome: Secondary | ICD-10-CM | POA: Diagnosis not present

## 2020-05-24 DIAGNOSIS — Z6830 Body mass index (BMI) 30.0-30.9, adult: Secondary | ICD-10-CM | POA: Diagnosis not present

## 2020-05-24 DIAGNOSIS — R739 Hyperglycemia, unspecified: Secondary | ICD-10-CM | POA: Diagnosis not present

## 2020-05-24 DIAGNOSIS — I10 Essential (primary) hypertension: Secondary | ICD-10-CM | POA: Diagnosis not present

## 2020-05-31 DIAGNOSIS — Z6830 Body mass index (BMI) 30.0-30.9, adult: Secondary | ICD-10-CM | POA: Diagnosis not present

## 2020-05-31 DIAGNOSIS — L89891 Pressure ulcer of other site, stage 1: Secondary | ICD-10-CM | POA: Diagnosis not present

## 2020-05-31 DIAGNOSIS — E6609 Other obesity due to excess calories: Secondary | ICD-10-CM | POA: Diagnosis not present

## 2020-06-03 DIAGNOSIS — I5023 Acute on chronic systolic (congestive) heart failure: Secondary | ICD-10-CM | POA: Diagnosis not present

## 2020-06-03 DIAGNOSIS — E1122 Type 2 diabetes mellitus with diabetic chronic kidney disease: Secondary | ICD-10-CM | POA: Diagnosis not present

## 2020-06-03 DIAGNOSIS — I130 Hypertensive heart and chronic kidney disease with heart failure and stage 1 through stage 4 chronic kidney disease, or unspecified chronic kidney disease: Secondary | ICD-10-CM | POA: Diagnosis not present

## 2020-06-03 DIAGNOSIS — E7849 Other hyperlipidemia: Secondary | ICD-10-CM | POA: Diagnosis not present

## 2020-06-05 ENCOUNTER — Ambulatory Visit: Payer: Medicare HMO | Admitting: Nurse Practitioner

## 2020-06-06 ENCOUNTER — Other Ambulatory Visit: Payer: Self-pay | Admitting: Cardiology

## 2020-07-04 DIAGNOSIS — E7849 Other hyperlipidemia: Secondary | ICD-10-CM | POA: Diagnosis not present

## 2020-07-04 DIAGNOSIS — I5023 Acute on chronic systolic (congestive) heart failure: Secondary | ICD-10-CM | POA: Diagnosis not present

## 2020-07-04 DIAGNOSIS — E1122 Type 2 diabetes mellitus with diabetic chronic kidney disease: Secondary | ICD-10-CM | POA: Diagnosis not present

## 2020-07-04 DIAGNOSIS — I130 Hypertensive heart and chronic kidney disease with heart failure and stage 1 through stage 4 chronic kidney disease, or unspecified chronic kidney disease: Secondary | ICD-10-CM | POA: Diagnosis not present

## 2020-07-11 ENCOUNTER — Other Ambulatory Visit: Payer: Self-pay | Admitting: *Deleted

## 2020-07-11 ENCOUNTER — Encounter: Payer: Self-pay | Admitting: Vascular Surgery

## 2020-07-11 ENCOUNTER — Ambulatory Visit: Payer: Medicare HMO | Admitting: Vascular Surgery

## 2020-07-11 ENCOUNTER — Other Ambulatory Visit: Payer: Self-pay

## 2020-07-11 ENCOUNTER — Encounter: Payer: Self-pay | Admitting: *Deleted

## 2020-07-11 VITALS — BP 160/83 | HR 64 | Temp 97.9°F | Resp 18 | Ht 71.0 in | Wt 215.0 lb

## 2020-07-11 DIAGNOSIS — I6523 Occlusion and stenosis of bilateral carotid arteries: Secondary | ICD-10-CM

## 2020-07-11 NOTE — Progress Notes (Signed)
Patient name: Xavier Caldwell MRN: [id number] DOB: [birth year] Sex: male  REASON FOR VISIT: 10-month follow-up to discuss left carotid endarterectomy  HPI: Xavier Caldwell is a 70 y.o. male with history of coronary disease status post NSTEMI complicated by V. tach arrest, A. fib on Eliquis, carotid disease, HTN, HLD that presents for 3 month follow-upto discuss left carotid intervention.  He previously underwent right carotid endarterectomy on 03/29/2020 for symptomatic high-grade stenosis.  Prior to surgery he had presented to the [facility] ED with some vague lower extremity weakness that progressed to left upper and lower extremity weakness concerning for TIA.  CTA neck and duplex suggested a high-grade bilateral ICA stenosis.  He has recovered well from his right carotid surgery.  He has had no neurologic events since last follow-up.  He feels he is at his neurologic baseline.  No issues swallowing or with other obvious neurologic disorder.  Past Medical History:  Diagnosis Date  . Acute systolic (congestive) heart failure ([place])   . Anxiety   . Arthritis   . Atrial fibrillation ([place])   . Back pain   . Chronic kidney disease    pt told by PA of Dr. [name] that he had "stage 3 kidney so they had me stop taking Metformin"  . Coronary artery disease   . Diabetes mellitus without complication ([place])   . High cholesterol   . History of gout   . Hypertension   . Myocardial infarction ([place]) 04/2018  . Neuropathy   . PAD (peripheral artery disease) ([place])   . 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 Sleep apnea    Stop Bang score of 4  . STEMI (ST elevation myocardial infarction) ([place])    04/08/2018 PCI/DES RCA  . VT (ventricular tachycardia) ([place])     Past Surgical History:  Procedure Laterality Date  . APPENDECTOMY    . CARDIOVERSION N/A 04/13/2018   Procedure: CARDIOVERSION;  Surgeon: Skeet Latch, MD;  Location: [place];  Service: Cardiovascular;  Laterality:  N/A;  . CATARACT EXTRACTION W/PHACO Right 04/25/2014   Procedure: CATARACT EXTRACTION PHACO AND INTRAOCULAR LENS PLACEMENT ([place]);  Surgeon: Williams Che, MD;  Location: [facility];  Service: Ophthalmology;  Laterality: Right;  CDE:4.70  . CATARACT EXTRACTION W/PHACO Left 07/11/2014   Procedure: CATARACT EXTRACTION PHACO AND INTRAOCULAR LENS PLACEMENT LEFT EYE CDE=8.32;  Surgeon: Williams Che, MD;  Location: [facility];  Service: Ophthalmology;  Laterality: Left;  . COLONOSCOPY WITH PROPOFOL N/A 09/23/2016   [place]: 9 polyps removed, multiple tubular adenomas. recommended 3 yr surveillance  . COLONOSCOPY WITH PROPOFOL N/A 12/27/2019   ten sessile polyps, 4-9 mm in size in descending, hepatic flexure, and cecum, 11 mm polyp in sigmoid, three sessile polyps in rectum and mid rectum 2-3 mm in size. Tubular adenomas and hyperplastic. 3 year surveillance  . CORONARY/GRAFT ACUTE MI REVASCULARIZATION N/A 04/07/2018   Procedure: Coronary/Graft Acute MI Revascularization;  Surgeon: Burnell Blanks, MD;  Location: [place] [facility];  Service: Cardiovascular;  Laterality: N/A;  . ENDARTERECTOMY Right 03/29/2020   Procedure: RIGHT CAROTID ARTERY ENDARTERECTOMY WITH PATCH ANGIOPLASTY;  Surgeon: Marty Heck, MD;  Location: [place];  Service: Vascular;  Laterality: Right;  . LEFT HEART CATH AND CORONARY ANGIOGRAPHY N/A 04/07/2018   Procedure: LEFT HEART CATH AND CORONARY ANGIOGRAPHY;  Surgeon: Burnell Blanks, MD;  Location: [place] [facility];  Service: Cardiovascular;  Laterality: N/A;  . MELANOMA EXCISION  07/2019  . POLYPECTOMY  09/23/2016   Procedure: POLYPECTOMY;  Surgeon: Daneil Dolin, MD;  [facility];  Service: Endoscopy;;  colon  . POLYPECTOMY  12/27/2019   Procedure: POLYPECTOMY;  Surgeon: Daneil Dolin, MD;  [facility];  Service: Endoscopy;;  . RETINAL DETACHMENT SURGERY      Family History  Problem Relation Age of Onset  . Hypertension Father   .  Colon cancer Neg Hx     SOCIAL HISTORY: Social History   Tobacco Use  . Smoking status: Former Smoker    Packs/day: 1.50    Years: 30.00    Pack years: 45.00    Types: Cigarettes    Quit date: 07/05/2012    Years since quitting: 8.0  . Smokeless tobacco: Never Used  Substance Use Topics  . Alcohol use: No    No Known Allergies  Current Outpatient Medications  Medication Sig Dispense Refill  . ALPRAZolam (XANAX) 0.5 MG tablet Take 0.5 mg by mouth 2 (two) times daily as needed for anxiety.    Marland Kitchen amLODipine (NORVASC) 10 MG tablet Take 1 tablet (10 mg total) by mouth daily. 30 tablet 1  . apixaban (ELIQUIS) 5 MG TABS tablet Take 1 tablet (5 mg total) by mouth 2 (two) times daily. Restart medication tomorrow, 03/31/2020 60 tablet 6  . B Complex-C (SUPER B COMPLEX PO) Take 1 tablet by mouth daily.     . carvedilol (COREG) 12.5 MG tablet TAKE ONE TABLET BY MOUTH TWICE DAILY 180 tablet 1  . DULoxetine (CYMBALTA) 60 MG capsule Take 60 mg by mouth daily with lunch.     . ergocalciferol (VITAMIN D2) 50000 units capsule Take 50,000 Units by mouth every Sunday.     . gabapentin (NEURONTIN) 300 MG capsule Take 300 mg by mouth 2 (two) times daily.     Marland Kitchen glucose blood (ONETOUCH ULTRA) test strip USE ONE TEST STRIP AS DIRECTED TWICE DAILY 100 each 0  . hydrALAZINE (APRESOLINE) 25 MG tablet Take 1 tablet (25 mg total) by mouth 3 (three) times daily. 90 tablet 0  . Insulin Glargine (BASAGLAR KWIKPEN) 100 UNIT/ML Inject 30 Units into the skin at bedtime. 30 mL 3  . linaclotide (LINZESS) 145 MCG CAPS capsule Take 1 capsule (145 mcg total) by mouth daily before breakfast. 30 capsule 3  . nitroGLYCERIN (NITROSTAT) 0.4 MG SL tablet Place 1 tablet (0.4 mg total) under the tongue every 5 (five) minutes as needed. (Patient taking differently: Place 0.4 mg under the tongue every 5 (five) minutes as needed for chest pain.) 25 tablet 2  . oxyCODONE-acetaminophen (PERCOCET/ROXICET) 5-325 MG tablet Take 1 tablet by  mouth every 4 (four) hours as needed for moderate pain. 15 tablet 0  . rOPINIRole (REQUIP) 1 MG tablet Take 1 mg by mouth 4 (four) times daily.     . timolol (TIMOPTIC) 0.5 % ophthalmic solution INSTILL 1 DROP INTO EACH EYE TWICE DAILY (Patient taking differently: Place 1 drop into both eyes 2 (two) times daily.) 10 mL 10  . UNIFINE PENTIPS 31G X 5 MM MISC USE TO INJECT INSULIN AT BEDTIME (Patient taking differently: at bedtime.) 100 each 5  . atorvastatin (LIPITOR) 80 MG tablet Take 1 tablet (80 mg total) by mouth daily. 90 tablet 3   No current facility-administered medications for this visit.    REVIEW OF SYSTEMS:  [X]  denotes positive finding, [ ]  denotes negative finding Cardiac  Comments:  Chest pain or chest pressure:    Shortness  of breath upon exertion:    Short of breath when lying flat:    Irregular heart rhythm:        Vascular    Pain in calf, thigh, or hip brought on by ambulation:    Pain in feet at night that wakes you up from your sleep:     Blood clot in your veins:    Leg swelling:         Pulmonary    Oxygen at home:    Productive cough:     Wheezing:         Neurologic    Sudden weakness in arms or legs:     Sudden numbness in arms or legs:     Sudden onset of difficulty speaking or slurred speech:    Temporary loss of vision in one eye:     Problems with dizziness:         Gastrointestinal    Blood in stool:     Vomited blood:         Genitourinary    Burning when urinating:     Blood in urine:        Psychiatric    Major depression:         Hematologic    Bleeding problems:    Problems with blood clotting too easily:        Skin    Rashes or ulcers:        Constitutional    Fever or chills:      PHYSICAL EXAM: Vitals:   07/11/20 1012 07/11/20 1016  BP: (!) 167/80 (!) 160/83  Pulse: 64 64  Resp: 18   Temp: 97.9 F (36.6 C)   TempSrc: Temporal   SpO2: 98%   Weight: 215 lb (97.5 kg)   Height: 5\' 11"  (1.803 m)     GENERAL: The  patient is a well-nourished male, in no acute distress. The vital signs are documented above. CARDIAC: There is a regular rate and rhythm.  VASCULAR:  Right neck incision healing without issue MUSCULOSKELETAL: There are no major deformities or cyanosis. NEUROLOGIC: No focal weakness or paresthesias are detected.  Nerves II through XII grossly intact. SKIN: There are no ulcers or rashes noted. PSYCHIATRIC: The patient has a normal affect.  DATA:   Previous duplex 03/21/2020 does show a left ICA high-grade stenosis with velocity 480/121 consistent with greater than 80% stenosis  Assessment/Plan:  70 year old male status post right carotid endarterectomy on 03/29/2020 for symptomatic high-grade stenosis.  He presents for 74-month follow-up to discuss left carotid intervention for contralateral asymptomatic high-grade stenosis.  He is now willing to proceed with left carotid surgery for stroke risk reduction.  Discussed current guidelines are for carotid surgery at greater than 80% stenosis in asymptomatic disease for stroke risk reduction.  I reviewed his previous carotid duplex from February that does confirm a high-grade stenosis as well as his previous CTA neck.  Given this is asymptomatic disease, I discussed this is elective and ultimately he wants to schedule in 3 months toward the end of the summer.  I discussed the steps of surgery again including risks and benefits.  We discussed risk of intraoperative stroke bleeding cranial nerve injury etc.  Marty Heck, MD [facility] Office: [phone]

## 2020-07-13 ENCOUNTER — Other Ambulatory Visit: Payer: Self-pay | Admitting: Cardiology

## 2020-07-17 ENCOUNTER — Ambulatory Visit: Payer: Medicare HMO | Admitting: Cardiology

## 2020-08-03 DIAGNOSIS — Z6830 Body mass index (BMI) 30.0-30.9, adult: Secondary | ICD-10-CM | POA: Diagnosis not present

## 2020-08-03 DIAGNOSIS — E1122 Type 2 diabetes mellitus with diabetic chronic kidney disease: Secondary | ICD-10-CM | POA: Diagnosis not present

## 2020-08-03 DIAGNOSIS — R739 Hyperglycemia, unspecified: Secondary | ICD-10-CM | POA: Diagnosis not present

## 2020-08-03 DIAGNOSIS — E039 Hypothyroidism, unspecified: Secondary | ICD-10-CM | POA: Diagnosis not present

## 2020-08-03 DIAGNOSIS — Z125 Encounter for screening for malignant neoplasm of prostate: Secondary | ICD-10-CM | POA: Diagnosis not present

## 2020-08-03 DIAGNOSIS — I4891 Unspecified atrial fibrillation: Secondary | ICD-10-CM | POA: Diagnosis not present

## 2020-08-03 DIAGNOSIS — Z1331 Encounter for screening for depression: Secondary | ICD-10-CM | POA: Diagnosis not present

## 2020-08-03 DIAGNOSIS — E7849 Other hyperlipidemia: Secondary | ICD-10-CM | POA: Diagnosis not present

## 2020-08-03 DIAGNOSIS — E6609 Other obesity due to excess calories: Secondary | ICD-10-CM | POA: Diagnosis not present

## 2020-08-03 DIAGNOSIS — Z0001 Encounter for general adult medical examination with abnormal findings: Secondary | ICD-10-CM | POA: Diagnosis not present

## 2020-08-03 DIAGNOSIS — E782 Mixed hyperlipidemia: Secondary | ICD-10-CM | POA: Diagnosis not present

## 2020-08-03 DIAGNOSIS — I5023 Acute on chronic systolic (congestive) heart failure: Secondary | ICD-10-CM | POA: Diagnosis not present

## 2020-08-03 DIAGNOSIS — Z1389 Encounter for screening for other disorder: Secondary | ICD-10-CM | POA: Diagnosis not present

## 2020-08-03 DIAGNOSIS — I130 Hypertensive heart and chronic kidney disease with heart failure and stage 1 through stage 4 chronic kidney disease, or unspecified chronic kidney disease: Secondary | ICD-10-CM | POA: Diagnosis not present

## 2020-08-03 DIAGNOSIS — E1121 Type 2 diabetes mellitus with diabetic nephropathy: Secondary | ICD-10-CM | POA: Diagnosis not present

## 2020-08-03 DIAGNOSIS — E1129 Type 2 diabetes mellitus with other diabetic kidney complication: Secondary | ICD-10-CM | POA: Diagnosis not present

## 2020-08-03 DIAGNOSIS — I6522 Occlusion and stenosis of left carotid artery: Secondary | ICD-10-CM | POA: Diagnosis not present

## 2020-08-03 DIAGNOSIS — I209 Angina pectoris, unspecified: Secondary | ICD-10-CM | POA: Diagnosis not present

## 2020-08-09 DIAGNOSIS — E0590 Thyrotoxicosis, unspecified without thyrotoxic crisis or storm: Secondary | ICD-10-CM | POA: Diagnosis not present

## 2020-08-10 NOTE — Progress Notes (Signed)
Cardiology Office Note  Date: 08/11/2020   ID: Xavier Caldwell, DOB 1950/11/07, MRN [id number]  PCP:  Xavier School, MD  Cardiologist:  Carlyle Dolly, MD Electrophysiologist:  None   Chief Complaint: 68-month follow-up  History of Present Illness: Xavier Caldwell is a 70 y.o. male with a history of systolic heart failure, atrial fibrillation, CKD, CAD, DM2, HLD, HTN, PAD, sleep apnea, history of ventricular tachycardia, STEMI, neuropathy, gout, back pain, arthritis.  Last seen by Dr. Harl Bowie on 01/04/2020.  Denied any recent chest pain SOB or DOE.  He was compliant with medications.  Atrial fibrillation initially diagnosed in March 2021 with STEMI.  Management was complicated by bradycardia.  Was on amiodarone status post DCCV.  Amiodarone was discontinued at outpatient follow-up.  Started on Eliquis.  He was having no recent palpitations or bleeding on Eliquis.  Home SBP's 110s to 160s.  He was compliant with medications.  Previous LDL in May 2021 was 77.  Atorvastatin was increased to 80 mg daily.  His carotid stenosis was followed by Dr. Carlis Abbott.  Plan was to continue current medications for CAD and PAF.  Blood pressure was above goal.  Coreg was increased to 12.5 mg p.o. twice daily.  Patient recently saw Dr. Carlis Abbott on 07/11/2020 vein and vascular surgery for follow-up status post right CEA on 03/29/2020 for symptomatic high-grade stenosis.  He was there for 48-month follow-up to discuss left carotid intervention for contralateral asymptomatic high-grade stenosis.  He was willing to proceed with left carotid surgery for stroke risk reduction.  Plan was to schedule surgery in 3 months toward the end of the summer.  His blood pressure was elevated on that visit at 160/83.  He is here for 68-month follow-up.  He denies any recent issues other than having endarterectomy to the right carotid and has anticipated surgery at the end of August for left carotid endarterectomy with Dr. Carlis Abbott.  He has a list of  blood pressures over the last few days which range anywhere from 1 systolics of 710 up to systolic of 626. He denies any anginal or exertional symptoms, palpitations or arrhythmias although he has atrial fibrillation.  Today's rate is controlled at 60 bpm.  He states his home blood pressures.  He denies any CVA or TIA-like symptoms.  He states shortly after his endarterectomy he had some TIA-like symptoms which eventually resolved.  He has no focal neurologic deficits today.  He states leading up to recently his blood sugars have not been well controlled but currently they are much better controlled.  He denies any PND or orthopnea, bleeding issues, claudication-like symptoms, DVT or PE-like symptoms, or lower extremity edema.  Current cardiac regimen includes amlodipine 10 mg daily, Eliquis 5 mg p.o. twice daily, atorvastatin 80 mg daily, carvedilol 12.5 mg p.o. twice daily, hydralazine 25 mg p.o. 3 times daily.  Sublingual nitroglycerin as needed.  Past Medical History:  Diagnosis Date   Acute systolic (congestive) heart failure (HCC)    Anxiety    Arthritis    Atrial fibrillation (HCC)    Back pain    Chronic kidney disease    pt told by PA of Dr. [name] that he had "stage 3 kidney so they had me stop taking Metformin"   Coronary artery disease    Diabetes mellitus without complication (HCC)    High cholesterol    History of gout    Hypertension    Myocardial infarction ([place]) 04/2018   Neuropathy    PAD (  peripheral artery disease) (HCC)    Retinal detachment    One on the right and two on the left   Sleep apnea    Stop Bang score of 4   STEMI (ST elevation myocardial infarction) ([place])    04/08/2018 PCI/DES RCA   VT (ventricular tachycardia) ([place])     Past Surgical History:  Procedure Laterality Date   APPENDECTOMY     CARDIOVERSION N/A 04/13/2018   Procedure: CARDIOVERSION;  Surgeon: Skeet Latch, MD;  Location: [facility];  Service: Cardiovascular;  Laterality: N/A;   CATARACT  EXTRACTION W/PHACO Right 04/25/2014   Procedure: CATARACT EXTRACTION PHACO AND INTRAOCULAR LENS PLACEMENT ([place]);  Surgeon: Williams Che, MD;  Location: [facility];  Service: Ophthalmology;  Laterality: Right;  CDE:4.70   CATARACT EXTRACTION W/PHACO Left 07/11/2014   Procedure: CATARACT EXTRACTION PHACO AND INTRAOCULAR LENS PLACEMENT LEFT EYE CDE=8.32;  Surgeon: Williams Che, MD;  Location: [facility];  Service: Ophthalmology;  Laterality: Left;   COLONOSCOPY WITH PROPOFOL N/A 09/23/2016   [place]: 9 polyps removed, multiple tubular adenomas. recommended 3 yr surveillance   COLONOSCOPY WITH PROPOFOL N/A 12/27/2019   ten sessile polyps, 4-9 mm in size in descending, hepatic flexure, and cecum, 11 mm polyp in sigmoid, three sessile polyps in rectum and mid rectum 2-3 mm in size. Tubular adenomas and hyperplastic. 3 year surveillance   CORONARY/GRAFT ACUTE MI REVASCULARIZATION N/A 04/07/2018   Procedure: Coronary/Graft Acute MI Revascularization;  Surgeon: Burnell Blanks, MD;  Location: [facility];  Service: Cardiovascular;  Laterality: N/A;   ENDARTERECTOMY Right 03/29/2020   Procedure: RIGHT CAROTID ARTERY ENDARTERECTOMY WITH PATCH ANGIOPLASTY;  Surgeon: Marty Heck, MD;  Location: [facility];  Service: Vascular;  Laterality: Right;   LEFT HEART CATH AND CORONARY ANGIOGRAPHY N/A 04/07/2018   Procedure: LEFT HEART CATH AND CORONARY ANGIOGRAPHY;  Surgeon: Burnell Blanks, MD;  Location: [facility];  Service: Cardiovascular;  Laterality: N/A;   MELANOMA EXCISION  07/2019   POLYPECTOMY  09/23/2016   Procedure: POLYPECTOMY;  Surgeon: Daneil Dolin, MD;  [facility];  Service: Endoscopy;;  colon   POLYPECTOMY  12/27/2019   Procedure: POLYPECTOMY;  Surgeon: Daneil Dolin, MD;  [facility];  Service: Endoscopy;;   RETINAL DETACHMENT SURGERY      Current Outpatient Medications  Medication Sig Dispense Refill   ALPRAZolam (XANAX) 0.5 MG tablet Take 0.5  mg by mouth 2 (two) times daily as needed for anxiety.     amLODipine (NORVASC) 10 MG tablet Take 1 tablet (10 mg total) by mouth daily. 30 tablet 1   apixaban (ELIQUIS) 5 MG TABS tablet Take 1 tablet (5 mg total) by mouth 2 (two) times daily. Restart medication tomorrow, 03/31/2020 60 tablet 6   atorvastatin (LIPITOR) 80 MG tablet Take 1 tablet (80 mg total) by mouth daily. 90 tablet 3   B Complex-C (SUPER B COMPLEX PO) Take 1 tablet by mouth daily.      carvedilol (COREG) 12.5 MG tablet TAKE ONE TABLET BY MOUTH TWICE DAILY 180 tablet 1   DULoxetine (CYMBALTA) 60 MG capsule Take 60 mg by mouth daily with lunch.      ergocalciferol (VITAMIN D2) 50000 units capsule Take 50,000 Units by mouth every Sunday.      gabapentin (NEURONTIN) 300 MG capsule Take 300 mg by mouth 2 (two) times daily.      glimepiride (AMARYL) 2 MG tablet Take 2 mg by mouth daily.     glucose  blood (ONETOUCH ULTRA) test strip USE ONE TEST STRIP AS DIRECTED TWICE DAILY 100 each 0   hydrALAZINE (APRESOLINE) 50 MG tablet Take 1 tablet (50 mg total) by mouth 3 (three) times daily. 270 tablet 3   Insulin Glargine (BASAGLAR KWIKPEN) 100 UNIT/ML Inject 30 Units into the skin at bedtime. 30 mL 3   linaclotide (LINZESS) 145 MCG CAPS capsule Take 1 capsule (145 mcg total) by mouth daily before breakfast. 30 capsule 3   nitroGLYCERIN (NITROSTAT) 0.4 MG SL tablet Place 1 tablet (0.4 mg total) under the tongue every 5 (five) minutes as needed. (Patient taking differently: Place 0.4 mg under the tongue every 5 (five) minutes as needed for chest pain.) 25 tablet 2   oxyCODONE-acetaminophen (PERCOCET/ROXICET) 5-325 MG tablet Take 1 tablet by mouth every 4 (four) hours as needed for moderate pain. 15 tablet 0   rOPINIRole (REQUIP) 1 MG tablet Take 1 mg by mouth 4 (four) times daily.      timolol (TIMOPTIC) 0.5 % ophthalmic solution INSTILL 1 DROP INTO EACH EYE TWICE DAILY (Patient taking differently: Place 1 drop into both eyes 2 (two) times  daily.) 10 mL 10   UNIFINE PENTIPS 31G X 5 MM MISC USE TO INJECT INSULIN AT BEDTIME (Patient taking differently: at bedtime.) 100 each 5   No current facility-administered medications for this visit.   Allergies:  Patient has no known allergies.   Social History: The patient  reports that he quit smoking about 8 years ago. His smoking use included cigarettes. He has a 45.00 pack-year smoking history. He has never used smokeless tobacco. He reports that he does not drink alcohol and does not use drugs.   Family History: The patient's family history includes Hypertension in his father.   ROS:  Please see the history of present illness. Otherwise, complete review of systems is positive for none.  All other systems are reviewed and negative.   Physical Exam: VS:  BP 138/70   Pulse 60   Ht 5\' 11"  (1.803 m)   Wt 220 lb 6.4 oz (100 kg)   SpO2 98%   BMI 30.74 kg/m , BMI Body mass index is 30.74 kg/m.  Wt Readings from Last 3 Encounters:  08/11/20 220 lb 6.4 oz (100 kg)  07/11/20 215 lb (97.5 kg)  04/04/20 208 lb (94.3 kg)    General: Patient appears comfortable at rest. Neck: Supple, no elevated JVP or carotid bruits, no thyromegaly. Lungs: Clear to auscultation, nonlabored breathing at rest. Cardiac: Regular rate and rhythm, no S3 or significant systolic murmur, no pericardial rub. Extremities: No pitting edema, distal pulses 2+. Skin: Warm and dry. Musculoskeletal: No kyphosis. Neuropsychiatric: Alert and oriented x3, affect grossly appropriate.  ECG:  EKG 03/09/2020 atrial fibrillation with premature ventricular complexes or aberrantly conducted complexes rate of 72, LAD and left BBB  Recent Labwork: 02/29/2020: TSH 0.166 03/23/2020: ALT 17; AST 19 03/30/2020: BUN 22; Creatinine, Ser 1.47; Hemoglobin 8.9; Platelets 227; Potassium 4.2; Sodium 137     Component Value Date/Time   CHOL 125 02/29/2020 1022   TRIG 85 02/29/2020 1022   HDL 38 (L) 02/29/2020 1022   CHOLHDL 3.3  02/29/2020 1022   CHOLHDL 4.4 04/07/2018 1349   VLDL 18 04/07/2018 1349   [place] 70 02/29/2020 1022    Other Studies Reviewed Today:  Carotid duplex study 03/21/2020 Right Carotid: Velocities in the right ICA are consistent with a 80-99% stenosis. The ECA appears >50% stenosed. Left Carotid: Velocities in the left ICA are consistent  with a 80-99% stenosis. The ECA appears >50% stenosed. Vertebrals: Bilateral vertebral arteries demonstrate antegrade flow. Subclavians: Normal flow hemodynamics were seen in bilateral subclavian arteries.  Vascular ultrasound with ABI 03/21/2020 Summary:  Right: Resting right ankle-brachial index indicates moderate right lower  extremity arterial disease. The right toe-brachial index is abnormal.   Left: Resting left ankle-brachial index is within normal range. No  evidence of significant left lower extremity arterial disease. The left  toe-brachial index is abnormal  Cath: 04/07/2018   Prox RCA lesion is 100% stenosed. Ost LAD to Prox LAD lesion is 30% stenosed. Mid LAD lesion is 40% stenosed. Ost 2nd Mrg to 2nd Mrg lesion is 40% stenosed. A drug-eluting stent was successfully placed using a STENT RESOLUTE ONYX 3.5X30. Post intervention, there is a 0% residual stenosis.   1. 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 Ventricular fibrillation/cardiac arrest with successful resuscitation 3. Successful PTCA/DES x 1 proximal RCA. 4. Successful aspiration thrombectomy distal RCA 5. Mild to moderate non-obstructive disease in the Circumflex and LAD   Recommendations: Will admit to ICU. Echo in the am. Will continue Aggrastat for 18 hours. Will continue ASA/Brilinta and statin. Will not start a beta blocker today given hypotension and bradycardia. Ace inh on hold with hypotension.    TTE: 04/08/2018   IMPRESSIONS      1. The left ventricle has mildly reduced systolic function, with an ejection fraction of 45-50%. The  cavity size was normal. There is moderately increased left ventricular wall thickness. Left ventricular diastolic Doppler parameters are indeterminate There is abnormal septal motion consistent with RV pacemaker. Left ventricular diffuse hypokinesis.  2. The right ventricle has mildly reduced systolic function. The cavity was mildly enlarged. There is no increase in right ventricular wall thickness.  3. The mitral valve is degenerative. There is mild mitral annular calcification present.  4. The aortic valve was not well visualized.  5. The aortic root and ascending aorta are normal in size and structure.  6. The inferior vena cava was dilated in size with <50% respiratory variability.  7. Severe hypokinesis of the left ventricular basal to mid inferior and inferoseptal.  8. The interatrial septum was not well visualized.      Assessment and Plan:  1. CAD in native artery   2. Atrial fibrillation, unspecified type ([place])   3. Bilateral carotid artery stenosis   4. PVD (peripheral vascular disease) ([place])   5. Essential hypertension   6. Mixed hyperlipidemia   7. Stage 3b chronic kidney disease ([place])    1. CAD in native artery Denies any anginal or exertional symptoms.  Continue carvedilol 12.5 mg p.o. twice daily.  Continue sublingual nitroglycerin as needed.  2. Atrial fibrillation, unspecified type ([place]) Rate controlled today with heart rate of 60 irregularly irregular.  Continue apixaban 5 mg p.o. twice daily.  Continue carvedilol 12.5 mg p.o. twice daily  3. Bilateral carotid artery stenosis Status post right CEA by Dr. Carlis Abbott with subsequent TIA which eventually resolved.  He has an upcoming left CEA in August per his statement.  See carotid studies above  4. PVD (peripheral vascular disease) ([place]) He denies any claudication-like symptoms. Previous ABIs 03/21/2020 right ABI indicated moderate right lower extremity arterial disease.  Right TBI was abnormal.  Left ABI within normal  range no evidence of lower extremity arterial disease.  Left TBI was abnormal  5. Essential hypertension Blood pressure today 138/70.  Patient states his blood pressures at home systolic running  in the range of 131-145.  Continue amlodipine 10 mg daily.  Continue carvedilol 12.5 mg p.o. twice daily.  Increase hydralazine to 50 mg p.o. 3 times daily.  Advised him to start checking his blood pressure after increased dose and call back in 2 weeks with blood pressure measurements.  6. Mixed hyperlipidemia Continue atorvastatin 80 mg p.o. daily.  Recent lipid panel on 02/29/2020 demonstrated TC 125, TG 85, HDL 38, LDL 70.  7.  CKD stage III. Renal function labs 02/29/2020 demonstrated creatinine of 2.16 and GFR of 31.  His metformin was previously stopped due to suspicion of contribution to renal dysfunction.  Medication Adjustments/Labs and Tests Ordered: Current medicines are reviewed at length with the patient today.  Concerns regarding medicines are outlined above.   Disposition: Follow-up with Dr. Harl Bowie or APP 6 months  Signed, Levell July, NP 08/11/2020 9:54 AM    [facility], [place], [place] [address] Phone: [phone]; Fax: [phone]

## 2020-08-11 ENCOUNTER — Encounter: Payer: Self-pay | Admitting: Family Medicine

## 2020-08-11 ENCOUNTER — Ambulatory Visit: Payer: Medicare HMO | Admitting: Family Medicine

## 2020-08-11 VITALS — BP 138/70 | HR 60 | Ht 71.0 in | Wt 220.4 lb

## 2020-08-11 DIAGNOSIS — E782 Mixed hyperlipidemia: Secondary | ICD-10-CM | POA: Diagnosis not present

## 2020-08-11 DIAGNOSIS — I2510 Atherosclerotic heart disease of native coronary artery without angina pectoris: Secondary | ICD-10-CM

## 2020-08-11 DIAGNOSIS — I10 Essential (primary) hypertension: Secondary | ICD-10-CM

## 2020-08-11 DIAGNOSIS — I4891 Unspecified atrial fibrillation: Secondary | ICD-10-CM | POA: Diagnosis not present

## 2020-08-11 DIAGNOSIS — I6523 Occlusion and stenosis of bilateral carotid arteries: Secondary | ICD-10-CM | POA: Diagnosis not present

## 2020-08-11 DIAGNOSIS — I739 Peripheral vascular disease, unspecified: Secondary | ICD-10-CM | POA: Diagnosis not present

## 2020-08-11 DIAGNOSIS — N1832 Chronic kidney disease, stage 3b: Secondary | ICD-10-CM | POA: Diagnosis not present

## 2020-08-11 MED ORDER — HYDRALAZINE HCL 50 MG PO TABS: 50.0000 mg | ORAL_TABLET | Freq: Three times a day (TID) | ORAL | 3 refills | Status: DC

## 2020-08-11 NOTE — Patient Instructions (Signed)
Medication Instructions:  Your physician has recommended you make the following change in your medication:  INCREASE Hydralazine to 50 mg three times daily  *If you need a refill on your cardiac medications before your next appointment, please call your pharmacy*   Lab Work: None If you have labs (blood work) drawn today and your tests are completely normal, you will receive your results only by: [place] (if you have MyChart) OR A paper copy in the mail If you have any lab test that is abnormal or we need to change your treatment, we will call you to review the results.   Testing/Procedures: None   Follow-Up: At [facility], you and your health needs are our priority.  As part of our continuing mission to provide you with exceptional heart care, we have created designated Provider Care Teams.  These Care Teams include your primary Cardiologist (physician) and Advanced Practice Providers (APPs -  Physician Assistants and Nurse Practitioners) who all work together to provide you with the care you need, when you need it.  We recommend signing up for the patient portal called "MyChart".  Sign up information is provided on this After Visit Summary.  MyChart is used to connect with patients for Virtual Visits (Telemedicine).  Patients are able to view lab/test results, encounter notes, upcoming appointments, etc.  Non-urgent messages can be sent to your provider as well.   To learn more about what you can do with MyChart, go to [email or web address].    Your next appointment:   6 month(s)  The format for your next appointment:   In Person  Provider:   Katina Dung, NP   Other Instructions Please keep a BP log for TWO weeks and call with results

## 2020-08-29 ENCOUNTER — Other Ambulatory Visit: Payer: Self-pay | Admitting: Cardiology

## 2020-08-30 ENCOUNTER — Ambulatory Visit: Payer: Medicare HMO | Admitting: Gastroenterology

## 2020-09-01 ENCOUNTER — Telehealth: Payer: Self-pay | Admitting: Family Medicine

## 2020-09-01 NOTE — Progress Notes (Addendum)
Surgical Instructions    Your procedure is scheduled on Monday, August 8th.  Report to [facility] Main Entrance "A" at 5:30 A.M., then check in with the Admitting office.  Call this number if you have problems the morning of surgery:  [phone]   If you have any questions prior to your surgery date call [phone]: Open Monday-Friday 8am-4pm    Remember:  Do not eat or drink after midnight the night before your surgery     Take these medicines the morning of surgery with A SIP OF WATER   Alprazolam (Xanax)  Amlodipine (Norvasc)  Atorvastatin (Lipitor)  Carvedilol (Coreg)  Gabapentin (Neurontin)  Hydralazine (Apresoline)  Hydrocodone-Acetaminophen (Norco/Vicodin)  Eye drops - if needed  Follow your surgeon's instructions on when to stop Eliquis.  Stop taking Eliquis (3) days prior to surgery.    As of today, STOP taking any Aspirin (unless otherwise instructed by your surgeon) Aleve, Naproxen, Ibuprofen, Motrin, Advil, Goody's, BC's, all herbal medications, fish oil, and all vitamins.  WHAT DO I DO ABOUT MY DIABETES MEDICATION?   Do not take oral diabetes medicines (pills) the morning of surgery. - Glimepiride (Amaryl)  THE NIGHT BEFORE SURGERY, do NOT take Glimepiride (Amaryl) at bedtime. If your dose is normally taken in the morning or at lunch time you may take regular dose.    THE NIGHT BEFORE SURGERY, take 50% (25 units) of dose of Insulin Glargine (Basaglar)   HOW TO MANAGE YOUR DIABETES BEFORE AND AFTER SURGERY  Why is it important to control my blood sugar before and after surgery? Improving blood sugar levels before and after surgery helps healing and can limit problems. A way of improving blood sugar control is eating a healthy diet by:  Eating less sugar and carbohydrates  Increasing activity/exercise  Talking with your doctor about reaching your blood sugar goals High blood sugars (greater than 180 mg/dL) can raise your risk of infections and slow your  recovery, so you will need to focus on controlling your diabetes during the weeks before surgery. Make sure that the doctor who takes care of your diabetes knows about your planned surgery including the date and location.  How do I manage my blood sugar before surgery? Check your blood sugar at least 4 times a day, starting 2 days before surgery, to make sure that the level is not too high or low.  Check your blood sugar the morning of your surgery when you wake up and every 2 hours until you get to the Short Stay unit.  If your blood sugar is less than 70 mg/dL, you will need to treat for low blood sugar: Do not take insulin. Treat a low blood sugar (less than 70 mg/dL) with  cup of clear juice (cranberry or apple), 4 glucose tablets, OR glucose gel. Recheck blood sugar in 15 minutes after treatment (to make sure it is greater than 70 mg/dL). If your blood sugar is not greater than 70 mg/dL on recheck, call [phone] for further instructions. Report your blood sugar to the short stay nurse when you get to Short Stay.  If you are admitted to the hospital after surgery: Your blood sugar will be checked by the staff and you will probably be given insulin after surgery (instead of oral diabetes medicines) to make sure you have good blood sugar levels. The goal for blood sugar control after surgery is 80-180 mg/dL.             DAY OF SURGERY: Do not  wear jewelry  Do not wear lotions, powders, colognes, or deodorant. Men may shave face and neck. Do not bring valuables to the hospital.             [facility] is not responsible for any belongings or valuables.  Do NOT Smoke (Tobacco/Vaping) or drink Alcohol 24 hours prior to your procedure If you use a CPAP at night, you may bring all equipment for your overnight stay.   Contacts, glasses, dentures or bridgework may not be worn into surgery, please bring cases for these belongings   For patients admitted to the hospital, discharge time  will be determined by your treatment team.   Patients discharged the day of surgery will not be allowed to drive home, and someone needs to stay with them for 24 hours.  ONLY 1 SUPPORT PERSON MAY BE PRESENT WHILE YOU ARE IN SURGERY. IF YOU ARE TO BE ADMITTED ONCE YOU ARE IN YOUR ROOM YOU WILL BE ALLOWED TWO (2) VISITORS.  Minor children may have two parents present. Special consideration for safety and communication needs will be reviewed on a case by case basis.  Special instructions:    Oral Hygiene is also important to reduce your risk of infection.  Remember - BRUSH YOUR TEETH THE MORNING OF SURGERY WITH YOUR REGULAR TOOTHPASTE   [facility]- Preparing For Surgery  Before surgery, you can play an important role. Because skin is not sterile, your skin needs to be as free of germs as possible. You can reduce the number of germs on your skin by washing with CHG (chlorahexidine gluconate) Soap before surgery.  CHG is an antiseptic cleaner which kills germs and bonds with the skin to continue killing germs even after washing.     Please do not use if you have an allergy to CHG or antibacterial soaps. If your skin becomes reddened/irritated stop using the CHG.  Do not shave (including legs and underarms) for at least 48 hours prior to first CHG shower. It is OK to shave your face.  Please follow these instructions carefully.     Shower the NIGHT BEFORE SURGERY and the MORNING OF SURGERY with CHG Soap.   If you chose to wash your hair, wash your hair first as usual with your normal shampoo. After you shampoo, rinse your hair and body thoroughly to remove the shampoo.  Then [employer] and genitals (private parts) with your normal soap and rinse thoroughly to remove soap.  After that Use CHG Soap as you would any other liquid soap. You can apply CHG directly to the skin and wash gently with a scrungie or a clean washcloth.   Apply the CHG Soap to your body ONLY FROM THE NECK DOWN.  Do not use on  open wounds or open sores. Avoid contact with your eyes, ears, mouth and genitals (private parts). Wash Face and genitals (private parts)  with your normal soap.   Wash thoroughly, paying special attention to the area where your surgery will be performed.  Thoroughly rinse your body with warm water from the neck down.  DO NOT shower/wash with your normal soap after using and rinsing off the CHG Soap.  Pat yourself dry with a CLEAN TOWEL.  Wear CLEAN PAJAMAS to bed the night before surgery  Place CLEAN SHEETS on your bed the night before your surgery  DO NOT SLEEP WITH PETS.   Day of Surgery:  Take a shower with CHG soap. Wear Clean/Comfortable clothing the morning of surgery Do  not apply any deodorants/lotions.   Remember to brush your teeth WITH YOUR REGULAR TOOTHPASTE.   Please read over the following fact sheets that you were given.

## 2020-09-01 NOTE — Telephone Encounter (Signed)
BP READINGS  7/24 139/65  71 7/25 117/51  65  7/26 133/61  71 7/27 126.73  82 7/28 137/60  62  7/29 139/66  71  ON 7/14 BP 97/46       56 Patient states he is feeling fine. He has gone back to work.

## 2020-09-03 DIAGNOSIS — E782 Mixed hyperlipidemia: Secondary | ICD-10-CM | POA: Diagnosis not present

## 2020-09-03 DIAGNOSIS — E1122 Type 2 diabetes mellitus with diabetic chronic kidney disease: Secondary | ICD-10-CM | POA: Diagnosis not present

## 2020-09-03 DIAGNOSIS — I130 Hypertensive heart and chronic kidney disease with heart failure and stage 1 through stage 4 chronic kidney disease, or unspecified chronic kidney disease: Secondary | ICD-10-CM | POA: Diagnosis not present

## 2020-09-03 DIAGNOSIS — I5023 Acute on chronic systolic (congestive) heart failure: Secondary | ICD-10-CM | POA: Diagnosis not present

## 2020-09-04 ENCOUNTER — Encounter (HOSPITAL_COMMUNITY)
Admission: RE | Admit: 2020-09-04 | Discharge: 2020-09-04 | Disposition: A | Discharge status: Home / Self Care | Payer: Medicare HMO | Source: Ambulatory Visit | Attending: Vascular Surgery | Admitting: Vascular Surgery

## 2020-09-04 ENCOUNTER — Encounter (HOSPITAL_COMMUNITY): Payer: Self-pay

## 2020-09-04 ENCOUNTER — Other Ambulatory Visit: Payer: Self-pay

## 2020-09-04 DIAGNOSIS — E1165 Type 2 diabetes mellitus with hyperglycemia: Secondary | ICD-10-CM | POA: Diagnosis not present

## 2020-09-04 DIAGNOSIS — Z01812 Encounter for preprocedural laboratory examination: Secondary | ICD-10-CM | POA: Insufficient documentation

## 2020-09-04 DIAGNOSIS — Z7901 Long term (current) use of anticoagulants: Secondary | ICD-10-CM | POA: Diagnosis not present

## 2020-09-04 DIAGNOSIS — Z7984 Long term (current) use of oral hypoglycemic drugs: Secondary | ICD-10-CM | POA: Diagnosis not present

## 2020-09-04 HISTORY — DX: Cerebral infarction, unspecified: I63.9

## 2020-09-04 LAB — COMPREHENSIVE METABOLIC PANEL
ALT: 23 U/L (ref 0–44)
AST: 21 U/L (ref 15–41)
Albumin: 3.3 g/dL — ABNORMAL LOW (ref 3.5–5.0)
Alkaline Phosphatase: 77 U/L (ref 38–126)
Anion gap: 7 (ref 5–15)
BUN: 17 mg/dL (ref 8–23)
CO2: 25 mmol/L (ref 22–32)
Calcium: 9.1 mg/dL (ref 8.9–10.3)
Chloride: 105 mmol/L (ref 98–111)
Creatinine, Ser: 1.13 mg/dL (ref 0.61–1.24)
GFR, Estimated: >60 mL/min (ref >60)
Glucose, Bld: 132 mg/dL — ABNORMAL HIGH (ref 70–99)
Potassium: 4.1 mmol/L (ref 3.5–5.1)
Sodium: 137 mmol/L (ref 135–145)
Total Bilirubin: 0.9 mg/dL (ref 0.3–1.2)
Total Protein: 6.2 g/dL — ABNORMAL LOW (ref 6.5–8.1)

## 2020-09-04 LAB — CBC
HCT: 37 % — ABNORMAL LOW (ref 39.0–52.0)
Hemoglobin: 12.2 g/dL — ABNORMAL LOW (ref 13.0–17.0)
MCH: 28.4 pg (ref 26.0–34.0)
MCHC: 33 g/dL (ref 30.0–36.0)
MCV: 86.2 fL (ref 80.0–100.0)
Platelets: 223 10*3/uL (ref 150–400)
RBC: 4.29 MIL/uL (ref 4.22–5.81)
RDW: 13.5 % (ref 11.5–15.5)
WBC: 8.1 10*3/uL (ref 4.0–10.5)
nRBC: 0 % (ref 0.0–0.2)

## 2020-09-04 LAB — URINALYSIS, ROUTINE W REFLEX MICROSCOPIC
Bilirubin Urine: NEGATIVE
Glucose, UA: NEGATIVE mg/dL
Hgb urine dipstick: NEGATIVE
Ketones, ur: NEGATIVE mg/dL
Leukocytes,Ua: NEGATIVE
Nitrite: NEGATIVE
Protein, ur: NEGATIVE mg/dL
Specific Gravity, Urine: 1.004 — ABNORMAL LOW (ref 1.005–1.030)
pH: 6 (ref 5.0–8.0)

## 2020-09-04 LAB — GLUCOSE, CAPILLARY: Glucose-Capillary: 132 mg/dL — ABNORMAL HIGH (ref 70–99)

## 2020-09-04 LAB — TYPE AND SCREEN
ABO/RH(D): B NEG
Antibody Screen: NEGATIVE

## 2020-09-04 LAB — PROTIME-INR
INR: 1.3 — ABNORMAL HIGH (ref 0.8–1.2)
Prothrombin Time: 16.1 seconds — ABNORMAL HIGH (ref 11.4–15.2)

## 2020-09-04 LAB — APTT: aPTT: 37 seconds — ABNORMAL HIGH (ref 24–36)

## 2020-09-04 LAB — SURGICAL PCR SCREEN
MRSA, PCR: NEGATIVE
Staphylococcus aureus: NEGATIVE

## 2020-09-04 LAB — HEMOGLOBIN A1C
Hgb A1c MFr Bld: 9.6 % — ABNORMAL HIGH (ref 4.8–5.6)
Mean Plasma Glucose: 228.82 mg/dL

## 2020-09-04 NOTE — Progress Notes (Signed)
Pt's abnormal labs (A1C 9.6 and PT 16.1) reported to Dr. Ainsley Spinner OR scheduler, K[place].

## 2020-09-04 NOTE — Telephone Encounter (Signed)
Patient notified via detailed voice message.

## 2020-09-04 NOTE — Progress Notes (Signed)
PCP - Redmond School MD Cardiologist - Harl Bowie, MD  PPM/ICD - n/a  Chest x-ray - n/a EKG - 03/10/20 Stress Test - pt denies ECHO - 04/08/18 Cardiac Cath - 04/07/18  Sleep Study - pt reports that he was dx w/sleep apnea but has not had a sleep study CPAP - pt denies  Fasting Blood Sugar - 90-150 Checks Blood Sugar twice a day  Blood Thinner Instructions: pt instructed to stop taking eliquis 3 days prior to surgery Aspirin Instructions: n/a  NPO at midnight  COVID TEST- pt was told by PCP to go thru drive thru testing site on Friday 09/08/20 for covid test. Requisition form given to pt.  Anesthesia review: yes, cardiac hx  Patient denies shortness of breath, fever, cough and chest pain at PAT appointment   All instructions explained to the patient, with a verbal understanding of the material. Patient agrees to go over the instructions while at home for a better understanding. Patient also instructed to self quarantine after being tested for COVID-19. The opportunity to ask questions was provided.

## 2020-09-05 NOTE — Anesthesia Preprocedure Evaluation (Addendum)
Anesthesia Evaluation  Patient identified by MRN, date of birth, ID band Patient awake    Reviewed: Allergy & Precautions, NPO status , Patient's Chart, lab work & pertinent test results  Airway Mallampati: III  TM Distance: >3 FB Neck ROM: Full    Dental  (+) Dental Advisory Given   Pulmonary sleep apnea , former smoker,    breath sounds clear to auscultation       Cardiovascular hypertension, Pt. on medications and Pt. on home beta blockers + CAD, + Past MI, + Cardiac Stents, + Peripheral Vascular Disease and +CHF  + dysrhythmias Atrial Fibrillation  Rhythm:Regular Rate:Normal     Neuro/Psych CVA    GI/Hepatic negative GI ROS, Neg liver ROS,   Endo/Other  diabetes, Type 2, Insulin Dependent  Renal/GU Renal disease     Musculoskeletal  (+) Arthritis ,   Abdominal   Peds  Hematology negative hematology ROS (+)   Anesthesia Other Findings   Reproductive/Obstetrics                            Lab Results  Component Value Date   WBC 8.1 09/04/2020   HGB 12.2 (L) 09/04/2020   HCT 37.0 (L) 09/04/2020   MCV 86.2 09/04/2020   PLT 223 09/04/2020   Lab Results  Component Value Date   CREATININE 1.13 09/04/2020   BUN 17 09/04/2020   NA 137 09/04/2020   K 4.1 09/04/2020   CL 105 09/04/2020   CO2 25 09/04/2020    Anesthesia Physical Anesthesia Plan  ASA: 3  Anesthesia Plan: General   Post-op Pain Management:    Induction: Intravenous  PONV Risk Score and Plan: 2 and Dexamethasone, Ondansetron and Treatment may vary due to age or medical condition  Airway Management Planned: Oral ETT  Additional Equipment: Arterial line  Intra-op Plan:   Post-operative Plan: Extubation in OR  Informed Consent: I have reviewed the patients History and Physical, chart, labs and discussed the procedure including the risks, benefits and alternatives for the proposed anesthesia with the patient or  authorized representative who has indicated his/her understanding and acceptance.     Dental advisory given  Plan Discussed with: CRNA  Anesthesia Plan Comments: (PAT note written 09/05/2020 by Myra Gianotti, PA-C. )       Anesthesia Quick Evaluation

## 2020-09-05 NOTE — Progress Notes (Signed)
Anesthesia Chart Review:  Case: [id number] Date/Time: 09/11/20 0715   Procedure: ENDARTERECTOMY CAROTID LEFT (Left)   Anesthesia type: General   Pre-op diagnosis: carotid stenosis   Location: [facility] OR ROOM 16 / [place] OR   Surgeons: Marty Heck, MD       DISCUSSION: Patient is a 70 year old male scheduled for the above procedure. He has bilateral 80-99% ICA stenosis and 03/2020 admission for BLE and LUE weakness (see below). Given left sided symptoms, Dr. Carlis Abbott thought patient may have had a TIA from symptomatic severe RICA stenosis. (MRI brain showed no acute abnormality with possible mild chronic microvascular ischemic changes.). Stage carotid endarterectomies planned, with right first given possible symptomatic disease. S/p right CEA 03/29/20.      History includes former smoker (quit 07/05/12), CAD (inferior STEMI 04/07/18, s/p aspiration thrombectomy and DES pRCA, mild-moderate CX and LAD disease; TVP placed for bradycardia and required successful defibrillation for VT while in cath lab 04/07/18), afib/PAF (s/p DCCV [id number]), systolic CHF, HTN, hypercholesterolemia, DM2, carotid artery disease (BICA 80-99% stenosis 03/21/20, s/p right carotid endarterectomy 03/29/20), retinal detachment (bilateral), PAD, CKD, OSA (does not wear CPAP), gout, neuropathy, anxiety, back pain, melanoma (s/p excision 07/2019). He has had a LBBB on EKG since at least July 2021. BMI is consistent with obesity.   - [facility] Admission 03/29/20-03/30/20 for right CEA with uneventful post-operative course.    - Admission 03/09/20-03/10/20 for acute bilateral leg and LUE weakness, SOB, fall without LOC. He drove himself to the [facility] ED. CODE STROKE activated. Head CT negative for acute stroke, Neck CTA showed ~ 70% BICA stenosis. Telemedicine Neurology consulted. MRI of brain and c-spine ordered. MRI brain showed no acute abnormality with possible mild chronic microvascular ischemic changes. MRI c-spine showed mild spinal canal stenosis at C4-7,  severe bilateral neural foraminal canal stenosis at C3-5/6-7, marrow edema in the articular processes of C4-5 facet joint, enlarged thyroid gland with non-urgent US recommended. Labs were significant for hyperkalemia with peak K 8.0 with Creatinine ~ 2.0 which was stable when compared with 02/29/20 Creatinine (although elevated Creatinine new since 03/2019). Hospitalist admitted for hyperkalemia felt likely related to ACEi. He was treated with calcium gluconate, IV insulin, D50, Kayexalate, IVF, Lasix. Lisinopril was discontinued. K 4.5-5.0 with Creatinine 1.77 by discharge. Strength improved. Per Discharge summary, "Heart rate currently stable and is anticoagulated with apixaban."   Last cardiology visit 08/11/20 with Levell July, NP.  He denied any anginal or exertional symptoms. Continued CAD and PAF medications.  Hydralazine increased to 50 mg 3 times daily.  Six month follow-up recommended.  He was aware of upcoming left CEA.     Preoperative labs showed mildly elevated PT/PTT 16.1/37. Patient is on Eliquis. He reported instructions to hold Eliquis 3 days prior to surgery. His A1c was elevated at 9.6% (up from 7.9% 03/29/20), consistent with average glucose of 228.82. He says his home fasting CBGs run ~ 90-150. PAT RN called A1c and PT results to K[facility] at [facility].  Preoperative COVID-19 test is scheduled for 09/08/20.  Anesthesia team will evaluate on the day of surgery. He will get a CBG on arrival.    VS: BP (!) 144/64   Pulse 61   Temp 36.9 C (Oral)   Resp 18   Ht '5\' 11"'$  (1.803 m)   Wt 100.8 kg   SpO2 99%   BMI 31.00 kg/m    PROVIDERS: Redmond School, MD is PCP  Carlyle Dolly, MD is cardiologist Monica Martinez, MD is vascular surgeon  Rourk, R. Legrand Como, MD is GI Loni Beckwith, MD is endocrinologist   LABS: Preoperative labs noted.  See DISCUSSION. (all labs ordered are listed, but only abnormal results are displayed)  Labs Reviewed  GLUCOSE, CAPILLARY - Abnormal; Notable  for the following components:      Result Value   Glucose-Capillary 132 (*)    All other components within normal limits  HEMOGLOBIN A1C - Abnormal; Notable for the following components:   Hgb A1c MFr Bld 9.6 (*)    All other components within normal limits  CBC - Abnormal; Notable for the following components:   Hemoglobin 12.2 (*)    HCT 37.0 (*)    All other components within normal limits  COMPREHENSIVE METABOLIC PANEL - Abnormal; Notable for the following components:   Glucose, Bld 132 (*)    Total Protein 6.2 (*)    Albumin 3.3 (*)    All other components within normal limits  PROTIME-INR - Abnormal; Notable for the following components:   Prothrombin Time 16.1 (*)    INR 1.3 (*)    All other components within normal limits  APTT - Abnormal; Notable for the following components:   aPTT 37 (*)    All other components within normal limits  URINALYSIS, ROUTINE W REFLEX MICROSCOPIC - Abnormal; Notable for the following components:   Color, Urine STRAW (*)    Specific Gravity, Urine 1.004 (*)    All other components within normal limits  SURGICAL PCR SCREEN  TYPE AND SCREEN     IMAGES: MRI Brain 03/09/20: IMPRESSION: 1. No acute intracranial abnormality. 2. Few foci of T2 hyperintensity within the white matter of the cerebral hemispheres, nonspecific but may represent mild chronic microvascular ischemic changes.   MRI c-spine 03/09/20: IMPRESSION: 1. Multilevel degenerative changes of the cervical spine as described above, with mild spinal canal stenosis at C4-5, C5-6 and C6-7. 2. Severe bilateral neural foraminal narrowing at C3-4, C4-5, and C6-7. 3. Marrow edema in the articular processes of the C4-5 facet joint. 4. Enlarged thyroid gland with multiple T2 hyperintense nodules, the largest in the right lobe measuring at least 2.7 cm. Correlation with non urgent ultrasound is recommended.   CTA head/neck 03/09/20: IMPRESSION: 1. 74% diameter stenosis proximal right  internal carotid artery due to heavily calcified atherosclerotic plaque 2. 73% diameter stenosis proximal left internal carotid artery due to heavily calcified atherosclerotic plaque 3. Mild stenosis proximal and distal basilar bilaterally. 4. Mild stenosis in the cavernous carotid bilaterally 5. Negative for intracranial large vessel occlusion 6. Thyroid goiter with multiple nodules. Largest nodule on the right 3 cm. Thyroid ultrasound recommended. (Ref: J Am Coll Radiol. 2015 Feb;12(2): 143-50). (S/p right CEA 03/29/20)    EKG:  EKG 03/09/20 1337 Sinus rhythm vs afib Left bundle branch block Confirmed by Aletta Edouard [phone]) on 03/09/2020 2:03:35 PM   EKG 03/09/20 1311: Atrial fibrillation with premature ventricular or aberrantly conducted complexes Left axis deviation Left bundle branch block Abnormal ECG new afib and LBBB since prior 3/20 Confirmed by Aletta Edouard [phone]) on 03/09/2020 1:23:34 PM   EKG 08/31/19 ([facility]): Sinus rhythm with frequent premature ventricular complexes Left axis deviation Left bundle branch block - LBBB new when compared to 04/21/18 tracing     CV: Carotid US 03/21/20: Summary:  - Right Carotid: Velocities in the right ICA are consistent with a 80-99% stenosis. The ECA appears >50% stenosed.  - Left Carotid: Velocities in the left ICA are consistent with a 80-99% stenosis. The ECA appears >50% stenosed.  -  Vertebrals:  Bilateral vertebral arteries demonstrate antegrade flow.  - Subclavians: Normal flow hemodynamics were seen in bilateral subclavian arteries.  (S/p right CEA 03/29/20)    Echo 04/08/18: IMPRESSIONS   1. The left ventricle has mildly reduced systolic function, with an  ejection fraction of 45-50%. The cavity size was normal. There is  moderately increased left ventricular wall thickness. 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 Left   ventricular diffuse hypokinesis.   2. The right ventricle has mildly reduced systolic function. The cavity  was mildly enlarged. There is no increase in right ventricular wall  thickness.   3. The mitral valve is degenerative. There is mild mitral annular  calcification present.   4. The aortic valve was not well visualized.   5. The aortic root and ascending aorta are normal in size and structure.   6. The inferior vena cava was dilated in size with <50% respiratory  variability.   7. Severe hypokinesis of the left ventricular basal to mid inferior and  inferoseptal.   8. The interatrial septum was not well visualized.      Cardiac cath 04/07/18: Prox RCA lesion is 100% stenosed. Ost LAD to Prox LAD lesion is 30% stenosed. Mid LAD lesion is 40% stenosed. Ost 2nd Mrg to 2nd Mrg lesion is 40% stenosed. A drug-eluting stent was successfully placed using a STENT RESOLUTE ONYX 3.5X30. Post intervention, there is a 0% residual stenosis.   1. 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 Ventricular fibrillation/cardiac arrest with successful resuscitation 3. Successful PTCA/DES x 1 proximal RCA. 4. Successful aspiration thrombectomy distal RCA 5. Mild to moderate non-obstructive disease in the Circumflex and LAD  Past Medical History:  Diagnosis Date   Acute systolic (congestive) heart failure (HCC)    Anxiety    Arthritis    Atrial fibrillation (HCC)    Back pain    Chronic kidney disease    pt told by PA of Dr. [name] that he had "stage 3 kidney so they had me stop taking Metformin"   Coronary artery disease    Diabetes mellitus without complication (HCC)    High cholesterol    History of gout    Hypertension    Myocardial infarction ([place]) 04/2018   Neuropathy    PAD (peripheral artery disease) (HCC)    Retinal detachment    One on the right and two on the left   Sleep apnea    Stop Bang score of 4   STEMI (ST elevation myocardial  infarction) ([place])    04/08/2018 PCI/DES RCA   Stroke [facility])    February 2022   VT (ventricular tachycardia) [facility])     Past Surgical History:  Procedure Laterality Date   APPENDECTOMY     CARDIOVERSION N/A 04/13/2018   Procedure: CARDIOVERSION;  Surgeon: Skeet Latch, MD;  Location: [place];  Service: Cardiovascular;  Laterality: N/A;   CATARACT EXTRACTION W/PHACO Right 04/25/2014   Procedure: CATARACT EXTRACTION PHACO AND INTRAOCULAR LENS PLACEMENT ([place]);  Surgeon: Williams Che, MD;  Location: [facility];  Service: Ophthalmology;  Laterality: Right;  CDE:4.70   CATARACT EXTRACTION W/PHACO Left 07/11/2014   Procedure: CATARACT EXTRACTION PHACO AND INTRAOCULAR LENS PLACEMENT LEFT EYE CDE=8.32;  Surgeon: Williams Che, MD;  Location: [facility];  Service: Ophthalmology;  Laterality: Left;   COLONOSCOPY WITH PROPOFOL N/A 09/23/2016   Rourk: 9 polyps removed, multiple tubular adenomas. recommended 3 yr  surveillance   COLONOSCOPY WITH PROPOFOL N/A 12/27/2019   ten sessile polyps, 4-9 mm in size in descending, hepatic flexure, and cecum, 11 mm polyp in sigmoid, three sessile polyps in rectum and mid rectum 2-3 mm in size. Tubular adenomas and hyperplastic. 3 year surveillance   CORONARY/GRAFT ACUTE MI REVASCULARIZATION N/A 04/07/2018   Procedure: Coronary/Graft Acute MI Revascularization;  Surgeon: Burnell Blanks, MD;  Location: [place] CV LAB;  Service: Cardiovascular;  Laterality: N/A;   ENDARTERECTOMY Right 03/29/2020   Procedure: RIGHT CAROTID ARTERY ENDARTERECTOMY WITH PATCH ANGIOPLASTY;  Surgeon: Marty Heck, MD;  Location: [place];  Service: Vascular;  Laterality: Right;   LEFT HEART CATH AND CORONARY ANGIOGRAPHY N/A 04/07/2018   Procedure: LEFT HEART CATH AND CORONARY ANGIOGRAPHY;  Surgeon: Burnell Blanks, MD;  Location: [place] CV LAB;  Service: Cardiovascular;  Laterality: N/A;   MELANOMA EXCISION  07/2019   POLYPECTOMY  09/23/2016   Procedure: POLYPECTOMY;   Surgeon: Daneil Dolin, MD;  [facility];  Service: Endoscopy;;  colon   POLYPECTOMY  12/27/2019   Procedure: POLYPECTOMY;  Surgeon: Daneil Dolin, MD;  [facility];  Service: Endoscopy;;   RETINAL DETACHMENT SURGERY      MEDICATIONS:  ALPRAZolam (XANAX) 0.5 MG tablet   amLODipine (NORVASC) 10 MG tablet   apixaban (ELIQUIS) 5 MG TABS tablet   atorvastatin (LIPITOR) 80 MG tablet   B Complex-C (SUPER B COMPLEX PO)   carvedilol (COREG) 12.5 MG tablet   DULoxetine (CYMBALTA) 60 MG capsule   ergocalciferol (VITAMIN D2) 50000 units capsule   gabapentin (NEURONTIN) 300 MG capsule   glimepiride (AMARYL) 4 MG tablet   glucose blood (ONETOUCH ULTRA) test strip   hydrALAZINE (APRESOLINE) 50 MG tablet   HYDROcodone-acetaminophen (NORCO/VICODIN) 5-325 MG tablet   Insulin Glargine (BASAGLAR KWIKPEN) 100 UNIT/ML   linaclotide (LINZESS) 145 MCG CAPS capsule   mupirocin ointment (BACTROBAN) 2 %   nitroGLYCERIN (NITROSTAT) 0.4 MG SL tablet   oxyCODONE-acetaminophen (PERCOCET/ROXICET) 5-325 MG tablet   rOPINIRole (REQUIP) 1 MG tablet   timolol (TIMOPTIC) 0.5 % ophthalmic solution   UNIFINE PENTIPS 31G X 5 MM MISC   No current facility-administered medications for this encounter.    Myra Gianotti, PA-C Surgical Short Stay/Anesthesiology [facility] Phone [phone] [facility] Phone [phone] 09/05/2020 4:22 PM

## 2020-09-07 ENCOUNTER — Other Ambulatory Visit: Payer: Self-pay | Admitting: Vascular Surgery

## 2020-09-07 LAB — SARS CORONAVIRUS 2 (TAT 6-24 HRS): SARS Coronavirus 2: NEGATIVE

## 2020-09-08 ENCOUNTER — Other Ambulatory Visit (HOSPITAL_COMMUNITY): Payer: Medicare HMO

## 2020-09-11 ENCOUNTER — Other Ambulatory Visit: Payer: Self-pay

## 2020-09-11 ENCOUNTER — Inpatient Hospital Stay (HOSPITAL_COMMUNITY): Payer: Medicare HMO | Admitting: Vascular Surgery

## 2020-09-11 ENCOUNTER — Encounter (HOSPITAL_COMMUNITY): Payer: Self-pay | Admitting: Vascular Surgery

## 2020-09-11 ENCOUNTER — Inpatient Hospital Stay (HOSPITAL_COMMUNITY): Payer: Medicare HMO | Admitting: Anesthesiology

## 2020-09-11 ENCOUNTER — Inpatient Hospital Stay (HOSPITAL_COMMUNITY)
Admission: RE | Admit: 2020-09-11 | Discharge: 2020-09-12 | DRG: 038 | Disposition: A | Discharge status: Home / Self Care | Payer: Medicare HMO | Attending: Vascular Surgery | Admitting: Vascular Surgery

## 2020-09-11 ENCOUNTER — Encounter (HOSPITAL_COMMUNITY)
Admission: RE | Disposition: A | Discharge status: Home / Self Care | Payer: Self-pay | Source: Home / Self Care | Attending: Vascular Surgery

## 2020-09-11 DIAGNOSIS — E1122 Type 2 diabetes mellitus with diabetic chronic kidney disease: Secondary | ICD-10-CM | POA: Diagnosis not present

## 2020-09-11 DIAGNOSIS — I130 Hypertensive heart and chronic kidney disease with heart failure and stage 1 through stage 4 chronic kidney disease, or unspecified chronic kidney disease: Secondary | ICD-10-CM | POA: Diagnosis present

## 2020-09-11 DIAGNOSIS — Z9049 Acquired absence of other specified parts of digestive tract: Secondary | ICD-10-CM | POA: Diagnosis not present

## 2020-09-11 DIAGNOSIS — I6522 Occlusion and stenosis of left carotid artery: Principal | ICD-10-CM | POA: Diagnosis present

## 2020-09-11 DIAGNOSIS — Z8249 Family history of ischemic heart disease and other diseases of the circulatory system: Secondary | ICD-10-CM | POA: Diagnosis not present

## 2020-09-11 DIAGNOSIS — F419 Anxiety disorder, unspecified: Secondary | ICD-10-CM | POA: Diagnosis present

## 2020-09-11 DIAGNOSIS — G4730 Sleep apnea, unspecified: Secondary | ICD-10-CM | POA: Diagnosis present

## 2020-09-11 DIAGNOSIS — M1990 Unspecified osteoarthritis, unspecified site: Secondary | ICD-10-CM | POA: Diagnosis not present

## 2020-09-11 DIAGNOSIS — Z961 Presence of intraocular lens: Secondary | ICD-10-CM | POA: Diagnosis present

## 2020-09-11 DIAGNOSIS — Z86010 Personal history of colonic polyps: Secondary | ICD-10-CM | POA: Diagnosis not present

## 2020-09-11 DIAGNOSIS — E7800 Pure hypercholesterolemia, unspecified: Secondary | ICD-10-CM | POA: Diagnosis not present

## 2020-09-11 DIAGNOSIS — E1151 Type 2 diabetes mellitus with diabetic peripheral angiopathy without gangrene: Secondary | ICD-10-CM | POA: Diagnosis not present

## 2020-09-11 DIAGNOSIS — I252 Old myocardial infarction: Secondary | ICD-10-CM

## 2020-09-11 DIAGNOSIS — I2510 Atherosclerotic heart disease of native coronary artery without angina pectoris: Secondary | ICD-10-CM | POA: Diagnosis present

## 2020-09-11 DIAGNOSIS — Z794 Long term (current) use of insulin: Secondary | ICD-10-CM | POA: Diagnosis not present

## 2020-09-11 DIAGNOSIS — Z9841 Cataract extraction status, right eye: Secondary | ICD-10-CM

## 2020-09-11 DIAGNOSIS — N183 Chronic kidney disease, stage 3 unspecified: Secondary | ICD-10-CM | POA: Diagnosis present

## 2020-09-11 DIAGNOSIS — I5022 Chronic systolic (congestive) heart failure: Secondary | ICD-10-CM | POA: Diagnosis not present

## 2020-09-11 DIAGNOSIS — H401121 Primary open-angle glaucoma, left eye, mild stage: Secondary | ICD-10-CM | POA: Diagnosis present

## 2020-09-11 DIAGNOSIS — I472 Ventricular tachycardia: Secondary | ICD-10-CM | POA: Diagnosis not present

## 2020-09-11 DIAGNOSIS — Z7901 Long term (current) use of anticoagulants: Secondary | ICD-10-CM

## 2020-09-11 DIAGNOSIS — I6521 Occlusion and stenosis of right carotid artery: Secondary | ICD-10-CM | POA: Diagnosis not present

## 2020-09-11 DIAGNOSIS — Z79899 Other long term (current) drug therapy: Secondary | ICD-10-CM

## 2020-09-11 DIAGNOSIS — Z87891 Personal history of nicotine dependence: Secondary | ICD-10-CM

## 2020-09-11 DIAGNOSIS — R69 Illness, unspecified: Secondary | ICD-10-CM | POA: Diagnosis not present

## 2020-09-11 DIAGNOSIS — Z20822 Contact with and (suspected) exposure to covid-19: Secondary | ICD-10-CM | POA: Diagnosis present

## 2020-09-11 DIAGNOSIS — Z8673 Personal history of transient ischemic attack (TIA), and cerebral infarction without residual deficits: Secondary | ICD-10-CM

## 2020-09-11 DIAGNOSIS — Z9842 Cataract extraction status, left eye: Secondary | ICD-10-CM | POA: Diagnosis not present

## 2020-09-11 DIAGNOSIS — I480 Paroxysmal atrial fibrillation: Secondary | ICD-10-CM | POA: Diagnosis not present

## 2020-09-11 HISTORY — PX: ENDARTERECTOMY: SHX5162

## 2020-09-11 HISTORY — PX: PATCH ANGIOPLASTY: SHX6230

## 2020-09-11 LAB — GLUCOSE, CAPILLARY
Glucose-Capillary: 162 mg/dL — ABNORMAL HIGH (ref 70–99)
Glucose-Capillary: 102 mg/dL — ABNORMAL HIGH (ref 70–99)
Glucose-Capillary: 159 mg/dL — ABNORMAL HIGH (ref 70–99)
Glucose-Capillary: 229 mg/dL — ABNORMAL HIGH (ref 70–99)

## 2020-09-11 SURGERY — ENDARTERECTOMY, CAROTID
Anesthesia: General | Site: Neck | Laterality: Left

## 2020-09-11 MED ORDER — FENTANYL CITRATE (PF) 100 MCG/2ML IJ SOLN
25.0000 ug | INTRAMUSCULAR | Status: DC | PRN
  Administered 2020-09-11: 25 ug via INTRAVENOUS

## 2020-09-11 MED ORDER — CHLORHEXIDINE GLUCONATE CLOTH 2 % EX PADS: 6.0000 | MEDICATED_PAD | Freq: Once | CUTANEOUS | Status: DC

## 2020-09-11 MED ORDER — DULOXETINE HCL 60 MG PO CPEP
60.0000 mg | ORAL_CAPSULE | Freq: Every day | ORAL | Status: DC
  Administered 2020-09-11: 60 mg via ORAL
  Filled 2020-09-11: qty 1

## 2020-09-11 MED ORDER — LACTATED RINGERS IV SOLN: INTRAVENOUS | Status: DC

## 2020-09-11 MED ORDER — CARVEDILOL 12.5 MG PO TABS
12.5000 mg | ORAL_TABLET | Freq: Two times a day (BID) | ORAL | Status: DC
  Administered 2020-09-11: 12.5 mg via ORAL
  Filled 2020-09-11: qty 1

## 2020-09-11 MED ORDER — GLYCOPYRROLATE PF 0.2 MG/ML IJ SOSY
PREFILLED_SYRINGE | INTRAMUSCULAR | Status: DC | PRN
  Administered 2020-09-11: .2 mg via INTRAVENOUS

## 2020-09-11 MED ORDER — LIDOCAINE HCL (PF) 1 % IJ SOLN
INTRAMUSCULAR | Status: AC
  Filled 2020-09-11: qty 5

## 2020-09-11 MED ORDER — SUGAMMADEX SODIUM 200 MG/2ML IV SOLN
INTRAVENOUS | Status: DC | PRN
  Administered 2020-09-11: 200 mg via INTRAVENOUS

## 2020-09-11 MED ORDER — AMLODIPINE BESYLATE 10 MG PO TABS
10.0000 mg | ORAL_TABLET | Freq: Every day | ORAL | Status: DC
  Administered 2020-09-12: 10 mg via ORAL
  Filled 2020-09-11: qty 1

## 2020-09-11 MED ORDER — ALUM & MAG HYDROXIDE-SIMETH 200-200-20 MG/5ML PO SUSP: 15.0000 mL | ORAL | Status: DC | PRN

## 2020-09-11 MED ORDER — BENZOCAINE 10 % MT GEL
Freq: Four times a day (QID) | OROMUCOSAL | Status: DC | PRN
  Filled 2020-09-11: qty 9

## 2020-09-11 MED ORDER — FENTANYL CITRATE (PF) 250 MCG/5ML IJ SOLN
INTRAMUSCULAR | Status: DC | PRN
  Administered 2020-09-11: 100 ug via INTRAVENOUS
  Administered 2020-09-11: 50 ug via INTRAVENOUS

## 2020-09-11 MED ORDER — ROPINIROLE HCL 1 MG PO TABS
1.0000 mg | ORAL_TABLET | Freq: Four times a day (QID) | ORAL | Status: DC
  Administered 2020-09-11 – 2020-09-12 (×3): 1 mg via ORAL
  Filled 2020-09-11 (×5): qty 1

## 2020-09-11 MED ORDER — DOCUSATE SODIUM 100 MG PO CAPS
100.0000 mg | ORAL_CAPSULE | Freq: Every day | ORAL | Status: DC
  Administered 2020-09-12: 100 mg via ORAL
  Filled 2020-09-11: qty 1

## 2020-09-11 MED ORDER — POTASSIUM CHLORIDE CRYS ER 20 MEQ PO TBCR: 20.0000 meq | EXTENDED_RELEASE_TABLET | Freq: Every day | ORAL | Status: DC | PRN

## 2020-09-11 MED ORDER — CHLORHEXIDINE GLUCONATE 0.12 % MT SOLN
15.0000 mL | Freq: Once | OROMUCOSAL | Status: AC
  Administered 2020-09-11: 15 mL via OROMUCOSAL
  Filled 2020-09-11: qty 15

## 2020-09-11 MED ORDER — 0.9 % SODIUM CHLORIDE (POUR BTL) OPTIME
TOPICAL | Status: DC | PRN
  Administered 2020-09-11 (×2): 1000 mL

## 2020-09-11 MED ORDER — ROCURONIUM BROMIDE 10 MG/ML (PF) SYRINGE
PREFILLED_SYRINGE | INTRAVENOUS | Status: DC | PRN
  Administered 2020-09-11: 20 mg via INTRAVENOUS
  Administered 2020-09-11: 50 mg via INTRAVENOUS

## 2020-09-11 MED ORDER — AMISULPRIDE (ANTIEMETIC) 5 MG/2ML IV SOLN: 10.0000 mg | Freq: Once | INTRAVENOUS | Status: DC | PRN

## 2020-09-11 MED ORDER — SODIUM CHLORIDE 0.9 % IV SOLN: INTRAVENOUS | Status: DC

## 2020-09-11 MED ORDER — ACETAMINOPHEN 500 MG PO TABS: 1000.0000 mg | ORAL_TABLET | Freq: Once | ORAL | Status: DC

## 2020-09-11 MED ORDER — PHENOL 1.4 % MT LIQD: 1.0000 | OROMUCOSAL | Status: DC | PRN

## 2020-09-11 MED ORDER — ONDANSETRON HCL 4 MG/2ML IJ SOLN
INTRAMUSCULAR | Status: DC | PRN
  Administered 2020-09-11: 4 mg via INTRAVENOUS

## 2020-09-11 MED ORDER — SODIUM CHLORIDE 0.9 % IV SOLN
0.0125 ug/kg/min | INTRAVENOUS | Status: DC
  Administered 2020-09-11: .1 ug/kg/min via INTRAVENOUS
  Filled 2020-09-11: qty 2000

## 2020-09-11 MED ORDER — INSULIN GLARGINE-YFGN 100 UNIT/ML ~~LOC~~ SOLN
50.0000 [IU] | Freq: Every day | SUBCUTANEOUS | Status: DC
  Administered 2020-09-11: 50 [IU] via SUBCUTANEOUS
  Filled 2020-09-11 (×2): qty 0.5

## 2020-09-11 MED ORDER — PROPOFOL 10 MG/ML IV BOLUS
INTRAVENOUS | Status: DC | PRN
  Administered 2020-09-11: 160 mg via INTRAVENOUS

## 2020-09-11 MED ORDER — PROPOFOL 10 MG/ML IV BOLUS
INTRAVENOUS | Status: AC
  Filled 2020-09-11: qty 40

## 2020-09-11 MED ORDER — OXYCODONE-ACETAMINOPHEN 5-325 MG PO TABS: 1.0000 | ORAL_TABLET | ORAL | Status: DC | PRN

## 2020-09-11 MED ORDER — ONDANSETRON HCL 4 MG/2ML IJ SOLN: 4.0000 mg | Freq: Four times a day (QID) | INTRAMUSCULAR | Status: DC | PRN

## 2020-09-11 MED ORDER — MORPHINE SULFATE (PF) 2 MG/ML IV SOLN: 2.0000 mg | INTRAVENOUS | Status: DC | PRN

## 2020-09-11 MED ORDER — DEXAMETHASONE SODIUM PHOSPHATE 10 MG/ML IJ SOLN
INTRAMUSCULAR | Status: DC | PRN
  Administered 2020-09-11: 5 mg via INTRAVENOUS

## 2020-09-11 MED ORDER — LABETALOL HCL 5 MG/ML IV SOLN
INTRAVENOUS | Status: AC
  Filled 2020-09-11: qty 4

## 2020-09-11 MED ORDER — CEFAZOLIN SODIUM-DEXTROSE 2-4 GM/100ML-% IV SOLN
2.0000 g | Freq: Three times a day (TID) | INTRAVENOUS | Status: AC
Start: 2020-09-11 — End: 2020-09-11
  Administered 2020-09-11 (×2): 2 g via INTRAVENOUS
  Filled 2020-09-11 (×2): qty 100

## 2020-09-11 MED ORDER — METOPROLOL TARTRATE 5 MG/5ML IV SOLN: 2.0000 mg | INTRAVENOUS | Status: DC | PRN

## 2020-09-11 MED ORDER — HYDRALAZINE HCL 20 MG/ML IJ SOLN: 5.0000 mg | INTRAMUSCULAR | Status: DC | PRN

## 2020-09-11 MED ORDER — ATORVASTATIN CALCIUM 80 MG PO TABS
80.0000 mg | ORAL_TABLET | Freq: Every day | ORAL | Status: DC
  Administered 2020-09-11: 80 mg via ORAL
  Filled 2020-09-11: qty 1

## 2020-09-11 MED ORDER — TIMOLOL MALEATE 0.5 % OP SOLN
1.0000 [drp] | Freq: Two times a day (BID) | OPHTHALMIC | Status: DC
  Administered 2020-09-11 – 2020-09-12 (×2): 1 [drp] via OPHTHALMIC
  Filled 2020-09-11: qty 5

## 2020-09-11 MED ORDER — POLYETHYLENE GLYCOL 3350 17 G PO PACK: 17.0000 g | PACK | Freq: Every day | ORAL | Status: DC | PRN

## 2020-09-11 MED ORDER — FENTANYL CITRATE (PF) 100 MCG/2ML IJ SOLN
INTRAMUSCULAR | Status: AC
  Filled 2020-09-11: qty 2

## 2020-09-11 MED ORDER — MAGNESIUM SULFATE 2 GM/50ML IV SOLN: 2.0000 g | Freq: Every day | INTRAVENOUS | Status: DC | PRN

## 2020-09-11 MED ORDER — LACTATED RINGERS IV SOLN: INTRAVENOUS | Status: DC | PRN

## 2020-09-11 MED ORDER — ORAL CARE MOUTH RINSE: 15.0000 mL | Freq: Once | OROMUCOSAL | Status: AC

## 2020-09-11 MED ORDER — SODIUM CHLORIDE 0.9 % IV SOLN: 500.0000 mL | Freq: Once | INTRAVENOUS | Status: DC | PRN

## 2020-09-11 MED ORDER — GABAPENTIN 300 MG PO CAPS
300.0000 mg | ORAL_CAPSULE | Freq: Two times a day (BID) | ORAL | Status: DC
  Administered 2020-09-11 – 2020-09-12 (×3): 300 mg via ORAL
  Filled 2020-09-11 (×3): qty 1

## 2020-09-11 MED ORDER — GLIMEPIRIDE 4 MG PO TABS
4.0000 mg | ORAL_TABLET | Freq: Every day | ORAL | Status: DC
  Administered 2020-09-12: 4 mg via ORAL
  Filled 2020-09-11: qty 1

## 2020-09-11 MED ORDER — EPHEDRINE SULFATE-NACL 50-0.9 MG/10ML-% IV SOSY
PREFILLED_SYRINGE | INTRAVENOUS | Status: DC | PRN
  Administered 2020-09-11: 10 mg via INTRAVENOUS
  Administered 2020-09-11: 5 mg via INTRAVENOUS

## 2020-09-11 MED ORDER — INSULIN ASPART 100 UNIT/ML IJ SOLN
0.0000 [IU] | Freq: Three times a day (TID) | INTRAMUSCULAR | Status: DC
  Administered 2020-09-12: 2 [IU] via SUBCUTANEOUS

## 2020-09-11 MED ORDER — LABETALOL HCL 5 MG/ML IV SOLN
5.0000 mg | INTRAVENOUS | Status: DC | PRN
Start: 2020-09-11 — End: 2020-09-11
  Administered 2020-09-11: 5 mg via INTRAVENOUS

## 2020-09-11 MED ORDER — HYDRALAZINE HCL 50 MG PO TABS
50.0000 mg | ORAL_TABLET | Freq: Three times a day (TID) | ORAL | Status: DC
  Administered 2020-09-11 – 2020-09-12 (×3): 50 mg via ORAL
  Filled 2020-09-11 (×3): qty 1

## 2020-09-11 MED ORDER — ACETAMINOPHEN 325 MG PO TABS: 325.0000 mg | ORAL_TABLET | ORAL | Status: DC | PRN

## 2020-09-11 MED ORDER — FENTANYL CITRATE (PF) 250 MCG/5ML IJ SOLN
INTRAMUSCULAR | Status: AC
  Filled 2020-09-11: qty 5

## 2020-09-11 MED ORDER — HEPARIN SODIUM (PORCINE) 1000 UNIT/ML IJ SOLN
INTRAMUSCULAR | Status: DC | PRN
  Administered 2020-09-11: 10000 [IU] via INTRAVENOUS

## 2020-09-11 MED ORDER — PROTAMINE SULFATE 10 MG/ML IV SOLN
INTRAVENOUS | Status: DC | PRN
  Administered 2020-09-11: 10 mg via INTRAVENOUS
  Administered 2020-09-11: 40 mg via INTRAVENOUS

## 2020-09-11 MED ORDER — BASAGLAR KWIKPEN 100 UNIT/ML ~~LOC~~ SOPN: 50.0000 [IU] | PEN_INJECTOR | Freq: Every day | SUBCUTANEOUS | Status: DC

## 2020-09-11 MED ORDER — PANTOPRAZOLE SODIUM 40 MG PO TBEC
40.0000 mg | DELAYED_RELEASE_TABLET | Freq: Every day | ORAL | Status: DC
  Administered 2020-09-11 – 2020-09-12 (×2): 40 mg via ORAL
  Filled 2020-09-11 (×2): qty 1

## 2020-09-11 MED ORDER — GUAIFENESIN-DM 100-10 MG/5ML PO SYRP: 15.0000 mL | ORAL_SOLUTION | ORAL | Status: DC | PRN

## 2020-09-11 MED ORDER — CEFAZOLIN SODIUM-DEXTROSE 2-4 GM/100ML-% IV SOLN
2.0000 g | INTRAVENOUS | Status: AC
  Administered 2020-09-11: 2 g via INTRAVENOUS
  Filled 2020-09-11: qty 100

## 2020-09-11 MED ORDER — CLEVIDIPINE BUTYRATE 0.5 MG/ML IV EMUL
INTRAVENOUS | Status: DC | PRN
  Administered 2020-09-11: 2 mg/h via INTRAVENOUS

## 2020-09-11 MED ORDER — LIDOCAINE 2% (20 MG/ML) 5 ML SYRINGE
INTRAMUSCULAR | Status: DC | PRN
  Administered 2020-09-11: 80 mg via INTRAVENOUS

## 2020-09-11 MED ORDER — ACETAMINOPHEN 650 MG RE SUPP: 325.0000 mg | RECTAL | Status: DC | PRN

## 2020-09-11 MED ORDER — ALPRAZOLAM 0.5 MG PO TABS: 0.5000 mg | ORAL_TABLET | Freq: Two times a day (BID) | ORAL | Status: DC | PRN

## 2020-09-11 MED ORDER — PHENYLEPHRINE HCL-NACL 20-0.9 MG/250ML-% IV SOLN
INTRAVENOUS | Status: DC | PRN
  Administered 2020-09-11: 20 ug/min via INTRAVENOUS

## 2020-09-11 MED ORDER — LABETALOL HCL 5 MG/ML IV SOLN: 10.0000 mg | INTRAVENOUS | Status: DC | PRN

## 2020-09-11 MED ORDER — HEPARIN 6000 UNIT IRRIGATION SOLUTION
Status: AC
  Filled 2020-09-11: qty 500

## 2020-09-11 MED ORDER — BISACODYL 5 MG PO TBEC: 5.0000 mg | DELAYED_RELEASE_TABLET | Freq: Every day | ORAL | Status: DC | PRN

## 2020-09-11 MED ORDER — HEPARIN 6000 UNIT IRRIGATION SOLUTION
Status: DC | PRN
  Administered 2020-09-11: 1

## 2020-09-11 MED ORDER — HEMOSTATIC AGENTS (NO CHARGE) OPTIME
TOPICAL | Status: DC | PRN
  Administered 2020-09-11: 1 via TOPICAL

## 2020-09-11 SURGICAL SUPPLY — 52 items
ADH SKN CLS APL DERMABOND .7 (GAUZE/BANDAGES/DRESSINGS) ×1
ADPR TBG 2 MALE LL ART (MISCELLANEOUS)
BAG COUNTER SPONGE SURGICOUNT (BAG) ×2 IMPLANT
CANISTER SUCT 3000ML PPV (MISCELLANEOUS) ×2 IMPLANT
CATH ROBINSON RED A/P 18FR (CATHETERS) ×2 IMPLANT
CLIP VESOCCLUDE MED 24/CT (CLIP) ×2 IMPLANT
CLIP VESOCCLUDE SM WIDE 24/CT (CLIP) ×2 IMPLANT
COVER PROBE W GEL 5X96 (DRAPES) IMPLANT
COVER TRANSDUCER ULTRASND GEL (DISPOSABLE) ×2 IMPLANT
DERMABOND ADVANCED (GAUZE/BANDAGES/DRESSINGS) ×1
DERMABOND ADVANCED .7 DNX12 (GAUZE/BANDAGES/DRESSINGS) ×1 IMPLANT
DRAIN CHANNEL 15F RND FF W/TCR (WOUND CARE) IMPLANT
ELECT REM PT RETURN 9FT ADLT (ELECTROSURGICAL) ×2
ELECTRODE REM PT RTRN 9FT ADLT (ELECTROSURGICAL) ×1 IMPLANT
EVACUATOR SILICONE 100CC (DRAIN) IMPLANT
GLOVE SRG 8 PF TXTR STRL LF DI (GLOVE) ×1 IMPLANT
GLOVE SURG ENC MOIS LTX SZ7.5 (GLOVE) ×2 IMPLANT
GLOVE SURG UNDER POLY LF SZ8 (GLOVE) ×2
GOWN STRL REUS W/ TWL LRG LVL3 (GOWN DISPOSABLE) ×2 IMPLANT
GOWN STRL REUS W/ TWL XL LVL3 (GOWN DISPOSABLE) ×2 IMPLANT
GOWN STRL REUS W/TWL LRG LVL3 (GOWN DISPOSABLE) ×4
GOWN STRL REUS W/TWL XL LVL3 (GOWN DISPOSABLE) ×4
HEMOSTAT SNOW SURGICEL 2X4 (HEMOSTASIS) ×2 IMPLANT
IV ADAPTER SYR DOUBLE MALE LL (MISCELLANEOUS) IMPLANT
KIT BASIN OR (CUSTOM PROCEDURE TRAY) ×2 IMPLANT
KIT SHUNT ARGYLE CAROTID ART 6 (VASCULAR PRODUCTS) ×2 IMPLANT
KIT TURNOVER KIT B (KITS) ×2 IMPLANT
LOOP VESSEL MINI RED (MISCELLANEOUS) IMPLANT
NEEDLE HYPO 25GX1X1/2 BEV (NEEDLE) IMPLANT
NEEDLE SPNL 20GX3.5 QUINCKE YW (NEEDLE) IMPLANT
NS IRRIG 1000ML POUR BTL (IV SOLUTION) ×6 IMPLANT
PACK CAROTID (CUSTOM PROCEDURE TRAY) ×2 IMPLANT
PAD ARMBOARD 7.5X6 YLW CONV (MISCELLANEOUS) ×4 IMPLANT
PATCH VASC XENOSURE 1CMX6CM (Vascular Products) ×2 IMPLANT
PATCH VASC XENOSURE 1X6 (Vascular Products) ×1 IMPLANT
POSITIONER HEAD DONUT 9IN (MISCELLANEOUS) ×2 IMPLANT
SHUNT CAROTID BYPASS 10 (VASCULAR PRODUCTS) IMPLANT
SHUNT CAROTID BYPASS 12FRX15.5 (VASCULAR PRODUCTS) IMPLANT
SPONGE SURGIFOAM ABS GEL 100 (HEMOSTASIS) IMPLANT
STOPCOCK 4 WAY LG BORE MALE ST (IV SETS) IMPLANT
SUT ETHILON 3 0 PS 1 (SUTURE) IMPLANT
SUT MNCRL AB 4-0 PS2 18 (SUTURE) ×2 IMPLANT
SUT PROLENE 5 0 C 1 24 (SUTURE) ×4 IMPLANT
SUT PROLENE 6 0 BV (SUTURE) ×4 IMPLANT
SUT SILK 3 0 (SUTURE) ×2
SUT SILK 3-0 18XBRD TIE 12 (SUTURE) ×1 IMPLANT
SUT VIC AB 3-0 SH 27 (SUTURE) ×2
SUT VIC AB 3-0 SH 27X BRD (SUTURE) ×1 IMPLANT
SYR CONTROL 10ML LL (SYRINGE) IMPLANT
TOWEL GREEN STERILE (TOWEL DISPOSABLE) ×2 IMPLANT
TUBING ART PRESS 48 MALE/FEM (TUBING) IMPLANT
WATER STERILE IRR 1000ML POUR (IV SOLUTION) ×2 IMPLANT

## 2020-09-11 NOTE — Anesthesia Postprocedure Evaluation (Signed)
Anesthesia Post Note  Patient: Xavier Caldwell  Procedure(s) Performed: ENDARTERECTOMY CAROTID LEFT (Left: Neck) PATCH ANGIOPLASTY (Left: Neck)     Patient location during evaluation: PACU Anesthesia Type: General Level of consciousness: awake and alert Pain management: pain level controlled Vital Signs Assessment: post-procedure vital signs reviewed and stable Respiratory status: spontaneous breathing, nonlabored ventilation, respiratory function stable and patient connected to nasal cannula oxygen Cardiovascular status: blood pressure returned to baseline and stable Postop Assessment: no apparent nausea or vomiting Anesthetic complications: no   No notable events documented.  Last Vitals:  Vitals:   09/11/20 1400 09/11/20 1500  BP: 126/63 129/62  Pulse: (!) 56 (!) 56  Resp: 15 17  Temp:    SpO2: 97% 98%    Last Pain:  Vitals:   09/11/20 1236  TempSrc: Oral  PainSc:                  Tiajuana Amass

## 2020-09-11 NOTE — H&P (Signed)
History and Physical Interval Note:  09/11/2020 7:26 AM  Xavier Caldwell  has presented today for surgery, with the diagnosis of carotid stenosis.  The various methods of treatment have been discussed with the patient and family. After consideration of risks, benefits and other options for treatment, the patient has consented to  Procedure(s): ENDARTERECTOMY CAROTID LEFT (Left) as a surgical intervention.  The patient's history has been reviewed, patient examined, no change in status, stable for surgery.  I have reviewed the patient's chart and labs.  Questions were answered to the patient's satisfaction.    Left carotid endarterectomy.  Risks and benefits again discussed.  Marty Heck  Patient name: RY KHAM            MRN: [id number]        DOB: 1951/01/04            Sex: male   REASON FOR VISIT: 84-monthfollow-up to discuss left carotid endarterectomy   HPI: RABDOU DOTTERYis a 70y.o. male with history of coronary disease status post NSTEMI complicated by V. tach arrest, A. fib on Eliquis, carotid disease, HTN, HLD that presents for 3 month follow-up to discuss left carotid intervention.  He previously underwent right carotid endarterectomy on 03/29/2020 for symptomatic high-grade stenosis.  [facility] that progressed to left upper and lower extremity weakness concerning for TIA.  CTA neck and duplex suggested a high-grade bilateral ICA stenosis.   He has recovered well from his right carotid surgery.  He has had no neurologic events since last follow-up.  He feels he is at his neurologic baseline.  No issues swallowing or with other obvious neurologic disorder.       Past Medical History:  Diagnosis Date   Acute systolic (congestive) heart failure (HCC)     Anxiety     Arthritis     Atrial fibrillation (HCC)     Back pain     Chronic kidney disease      pt told by PA of Dr. NDorris Fetchthat he had  "stage 3 kidney so they had me stop taking Metformin"   Coronary artery disease     Diabetes mellitus without complication (HCC)     High cholesterol     History of gout     Hypertension     Myocardial infarction ([facility] 04/2018   Neuropathy     PAD (peripheral artery disease) (HCC)     Retinal detachment      One on the right and two on the left   Sleep apnea      Stop Bang score of 4   [place]      04/08/2018 PCI/DES RCA   VT (ventricular tachycardia) ([facility]             Past Surgical History:  Procedure Laterality Date   APPENDECTOMY       CARDIOVERSION N/A 04/13/2018    Procedure: CARDIOVERSION;  Surgeon: RSkeet Latch MD;  [place] [facility]  Service: Cardiovascular;  Laterality: N/A;   CATARACT EXTRACTION W/PHACO Right 04/25/2014    Procedure: CATARACT EXTRACTION PHACO AND INTRAOCULAR LENS PLACEMENT [place];  Surgeon: CWilliams Che MD;  Location: [facility];  Service: Ophthalmology;  Laterality: Right;  CDE:4.70   CATARACT EXTRACTION W/PHACO Left 07/11/2014    Procedure: CATARACT EXTRACTION PHACO AND INTRAOCULAR LENS PLACEMENT LEFT EYE CDE=8.32;  Surgeon: CDebe Coder  Iona Hansen, MD;  Location: [facility];  Service: Ophthalmology;  Laterality: Left;   COLONOSCOPY WITH PROPOFOL N/A 09/23/2016    [place]: 9 polyps removed, multiple tubular adenomas. recommended 3 yr surveillance   COLONOSCOPY WITH PROPOFOL N/A 12/27/2019    ten sessile polyps, 4-9 mm in size in descending, hepatic flexure, and cecum, 11 mm polyp in sigmoid, three sessile polyps in rectum and mid rectum 2-3 mm in size. Tubular adenomas and hyperplastic. 3 year surveillance   CORONARY/GRAFT ACUTE MI REVASCULARIZATION N/A 04/07/2018    Procedure: Coronary/Graft Acute MI Revascularization;  Surgeon: Burnell Blanks, MD;  Location: [place] [facility];  Service: Cardiovascular;  Laterality: N/A;   ENDARTERECTOMY Right 03/29/2020    Procedure: RIGHT CAROTID ARTERY ENDARTERECTOMY WITH PATCH  ANGIOPLASTY;  Surgeon: Marty Heck, MD;  Location: [place];  Service: Vascular;  Laterality: Right;   LEFT HEART CATH AND CORONARY ANGIOGRAPHY N/A 04/07/2018    Procedure: LEFT HEART CATH AND CORONARY ANGIOGRAPHY;  Surgeon: Burnell Blanks, MD;  Location: [place] [facility];  Service: Cardiovascular;  Laterality: N/A;   MELANOMA EXCISION   07/2019   POLYPECTOMY   09/23/2016    Procedure: POLYPECTOMY;  Surgeon: Daneil Dolin, MD;  [facility];  Service: Endoscopy;;  colon   POLYPECTOMY   12/27/2019    Procedure: POLYPECTOMY;  Surgeon: Daneil Dolin, MD;  [facility];  Service: Endoscopy;;   RETINAL DETACHMENT SURGERY               Family History  Problem Relation Age of Onset   Hypertension Father     Colon cancer Neg Hx        SOCIAL HISTORY: Social History         Tobacco Use   Smoking status: Former Smoker      Packs/day: 1.50      Years: 30.00      Pack years: 45.00      Types: Cigarettes      Quit date: 07/05/2012      Years since quitting: 8.0   Smokeless tobacco: Never Used  Substance Use Topics   Alcohol use: No      No Known Allergies         Current Outpatient Medications  Medication Sig Dispense Refill   ALPRAZolam (XANAX) 0.5 MG tablet Take 0.5 mg by mouth 2 (two) times daily as needed for anxiety.       amLODipine (NORVASC) 10 MG tablet Take 1 tablet (10 mg total) by mouth daily. 30 tablet 1   apixaban (ELIQUIS) 5 MG TABS tablet Take 1 tablet (5 mg total) by mouth 2 (two) times daily. Restart medication tomorrow, 03/31/2020 60 tablet 6   B Complex-C (SUPER B COMPLEX PO) Take 1 tablet by mouth daily.       carvedilol (COREG) 12.5 MG tablet TAKE ONE TABLET BY MOUTH TWICE DAILY 180 tablet 1   DULoxetine (CYMBALTA) 60 MG capsule Take 60 mg by mouth daily with lunch.       ergocalciferol (VITAMIN D2) 50000 units capsule Take 50,000 Units by mouth every Sunday.       gabapentin (NEURONTIN) 300 MG capsule Take 300 mg by mouth 2  (two) times daily.       glucose blood (ONETOUCH ULTRA) test strip USE ONE TEST STRIP AS DIRECTED TWICE DAILY 100 each 0   hydrALAZINE (APRESOLINE) 25 MG tablet Take 1 tablet (25 mg total) by mouth 3 (three) times daily. 90 tablet 0  Insulin Glargine (BASAGLAR KWIKPEN) 100 UNIT/ML Inject 30 Units into the skin at bedtime. 30 mL 3   linaclotide (LINZESS) 145 MCG CAPS capsule Take 1 capsule (145 mcg total) by mouth daily before breakfast. 30 capsule 3   nitroGLYCERIN (NITROSTAT) 0.4 MG SL tablet Place 1 tablet (0.4 mg total) under the tongue every 5 (five) minutes as needed. (Patient taking differently: Place 0.4 mg under the tongue every 5 (five) minutes as needed for chest pain.) 25 tablet 2   oxyCODONE-acetaminophen (PERCOCET/ROXICET) 5-325 MG tablet Take 1 tablet by mouth every 4 (four) hours as needed for moderate pain. 15 tablet 0   rOPINIRole (REQUIP) 1 MG tablet Take 1 mg by mouth 4 (four) times daily.       timolol (TIMOPTIC) 0.5 % ophthalmic solution INSTILL 1 DROP INTO EACH EYE TWICE DAILY (Patient taking differently: Place 1 drop into both eyes 2 (two) times daily.) 10 mL 10   UNIFINE PENTIPS 31G X 5 MM MISC USE TO INJECT INSULIN AT BEDTIME (Patient taking differently: at bedtime.) 100 each 5   atorvastatin (LIPITOR) 80 MG tablet Take 1 tablet (80 mg total) by mouth daily. 90 tablet 3    No current facility-administered medications for this visit.      REVIEW OF SYSTEMS:  '[X]'$  denotes positive finding, '[ ]'$  denotes negative finding Cardiac   Comments:  Chest pain or chest pressure:      Shortness of breath upon exertion:      Short of breath when lying flat:      Irregular heart rhythm:             Vascular      Pain in calf, thigh, or hip brought on by ambulation:      Pain in feet at night that wakes you up from your sleep:      Blood clot in your veins:      Leg swelling:             Pulmonary      Oxygen at home:      Productive cough:      Wheezing:              Neurologic      Sudden weakness in arms or legs:      Sudden numbness in arms or legs:      Sudden onset of difficulty speaking or slurred speech:      Temporary loss of vision in one eye:      Problems with dizziness:             Gastrointestinal      Blood in stool:      Vomited blood:             Genitourinary      Burning when urinating:      Blood in urine:             Psychiatric      Major depression:             Hematologic      Bleeding problems:      Problems with blood clotting too easily:             Skin      Rashes or ulcers:             Constitutional      Fever or chills:          PHYSICAL EXAM:     Vitals:  07/11/20 1012 07/11/20 1016  BP: (!) 167/80 (!) 160/83  Pulse: 64 64  Resp: 18    Temp: 97.9 F (36.6 C)    TempSrc: Temporal    SpO2: 98%    Weight: 215 lb (97.5 kg)    Height: '5\' 11"'$  (1.803 m)        GENERAL: The patient is a well-nourished male, in no acute distress. The vital signs are documented above. CARDIAC: There is a regular rate and rhythm. VASCULAR:  Right neck incision healing without issue MUSCULOSKELETAL: There are no major deformities or cyanosis. NEUROLOGIC: No focal weakness or paresthesias are detected.  Nerves II through XII grossly intact. SKIN: There are no ulcers or rashes noted. PSYCHIATRIC: The patient has a normal affect.   DATA:    Previous duplex 03/21/2020 does show a left ICA high-grade stenosis with velocity 480/121 consistent with greater than 80% stenosis   Assessment/Plan:   70 year old male status post right carotid endarterectomy on 03/29/2020 for symptomatic high-grade stenosis.  He presents for 6-monthfollow-up to discuss left carotid intervention for contralateral asymptomatic high-grade stenosis.  He is now willing to proceed with left carotid surgery for stroke risk reduction.  Discussed current guidelines are for carotid surgery at greater than 80% stenosis in asymptomatic disease for stroke  risk reduction.  I reviewed his previous carotid duplex from February that does confirm a high-grade stenosis as well as his previous CTA neck.  Given this is asymptomatic disease, I discussed this is elective and ultimately he wants to schedule in 3 months toward the end of the summer.  I discussed the steps of surgery again including risks and benefits.  We discussed risk of intraoperative stroke bleeding cranial nerve injury etc.   CMarty Heck MD [facility] [phone]

## 2020-09-11 NOTE — Discharge Instructions (Signed)
   [facility] [place]  Discharge Instructions   Carotid Endarterectomy (CEA)  Please refer to the following instructions for your post-procedure care. Your surgeon or physician assistant will discuss any changes with you.  Activity  You are encouraged to walk as much as you can. You can slowly return to normal activities but must avoid strenuous activity and heavy lifting until your doctor tell you it's OK. Avoid activities such as vacuuming or swinging a golf club. You can drive after one week if you are comfortable and you are no longer taking prescription pain medications. It is normal to feel tired for serval weeks after your surgery. It is also normal to have difficulty with sleep habits, eating, and bowel movements after surgery. These will go away with time.  Bathing/Showering  You may shower after you come home. Do not soak in a bathtub, hot tub, or swim until the incision heals completely.  Incision Care  Shower every day. Clean your incision with mild soap and water. Pat the area dry with a clean towel. You do not need a bandage unless otherwise instructed. Do not apply any ointments or creams to your incision. You may have skin glue on your incision. Do not peel it off. It will come off on its own in about one week. Your incision may feel thickened and raised for several weeks after your surgery. This is normal and the skin will soften over time. For Men Only: It's OK to shave around the incision but do not shave the incision itself for 2 weeks. It is common to have numbness under your chin that could last for several months.  Diet  Resume your normal diet. There are no special food restrictions following this procedure. A low fat/low cholesterol diet is recommended for all patients with vascular disease. In order to heal from your surgery, it is CRITICAL to get adequate nutrition. Your body requires vitamins, minerals, and protein. Vegetables are the best  source of vitamins and minerals. Vegetables also provide the perfect balance of protein. Processed food has little nutritional value, so try to avoid this.        Medications  Resume taking all of your medications unless your doctor or physician assistant tells you not to. If your incision is causing pain, you may take over-the- counter pain relievers such as acetaminophen (Tylenol). If you were prescribed a stronger pain medication, please be aware these medications can cause nausea and constipation. Prevent nausea by taking the medication with a snack or meal. Avoid constipation by drinking plenty of fluids and eating foods with a high amount of fiber, such as fruits, vegetables, and grains. Do not take Tylenol if you are taking prescription pain medications.  Follow Up  Our office will schedule a follow up appointment 2-3 weeks following discharge.  Please call us immediately for any of the following conditions  Increased pain, redness, drainage (pus) from your incision site. Fever of 101 degrees or higher. If you should develop stroke (slurred speech, difficulty swallowing, weakness on one side of your body, loss of vision) you should call 911 and go to the nearest emergency room.  Reduce your risk of vascular disease:  Stop smoking. If you would like help call [facility] at 1-800-QUIT-NOW (1-800-784-8669) or [place] at 336-586-4000. Manage your cholesterol Maintain a desired weight Control your diabetes Keep your blood pressure down  If you have any questions, please call the office at [phone].   

## 2020-09-11 NOTE — Transfer of Care (Signed)
Immediate Anesthesia Transfer of Care Note  Patient: Xavier Caldwell  Procedure(s) Performed: ENDARTERECTOMY CAROTID LEFT (Left: Neck) PATCH ANGIOPLASTY (Left: Neck)  Patient Location: PACU  Anesthesia Type:General  Level of Consciousness: awake, alert  and patient cooperative  Airway & Oxygen Therapy: Patient Spontanous Breathing and Patient connected to face mask oxygen  Post-op Assessment: Report given to RN and Post -op Vital signs reviewed and stable  Post vital signs: Reviewed and stable  Last Vitals:  Vitals Value Taken Time  BP 162/70 09/11/20 1009  Temp 36.6 C 09/11/20 1007  Pulse 69 09/11/20 1020  Resp 19 09/11/20 1020  SpO2 100 % 09/11/20 1020  Vitals shown include unvalidated device data.  Last Pain:  Vitals:   09/11/20 0618  PainSc: 0-No pain         Complications: No notable events documented.

## 2020-09-11 NOTE — Anesthesia Procedure Notes (Signed)
Arterial Line Insertion Start/End8/09/2020 7:00 AM  Patient location: Pre-op. Preanesthetic checklist: patient identified, IV checked, site marked, risks and benefits discussed, surgical consent, monitors and equipment checked, pre-op evaluation, timeout performed and anesthesia consent Lidocaine 1% used for infiltration Left, radial was placed Catheter size: 20 G Hand hygiene performed  and maximum sterile barriers used   Attempts: 1 Procedure performed without using ultrasound guided technique. Following insertion, dressing applied and Biopatch. Post procedure assessment: normal and unchanged  Patient tolerated the procedure well with no immediate complications.

## 2020-09-11 NOTE — Anesthesia Procedure Notes (Signed)
Procedure Name: Intubation Date/Time: 09/11/2020 7:45 AM Performed by: Thelma Comp, CRNA Pre-anesthesia Checklist: Patient identified, Emergency Drugs available, Suction available and Patient being monitored Patient Re-evaluated:Patient Re-evaluated prior to induction Oxygen Delivery Method: Circle System Utilized Preoxygenation: Pre-oxygenation with 100% oxygen Induction Type: IV induction Ventilation: Mask ventilation without difficulty Laryngoscope Size: Mac and 4 Grade View: Grade II Tube type: Oral Tube size: 7.5 mm Number of attempts: 1 Airway Equipment and Method: Stylet Placement Confirmation: ETT inserted through vocal cords under direct vision, positive ETCO2 and breath sounds checked- equal and bilateral Secured at: 23 cm Tube secured with: Tape Dental Injury: Teeth and Oropharynx as per pre-operative assessment

## 2020-09-11 NOTE — Op Note (Signed)
OPERATIVE NOTE  PROCEDURE:   1.  left carotid endarterectomy with bovine patch angioplasty 2.  left intraoperative carotid ultrasound  PRE-OPERATIVE DIAGNOSIS: left asymptomatic high grade carotid stenosis (>80%)  POST-OPERATIVE DIAGNOSIS: same as above   SURGEON: Marty Heck, MD  ASSISTANT(S): Risa Grill, PA  ANESTHESIA: general  ESTIMATED BLOOD LOSS: <75 mL  FINDING(S): 1.  Calcified high grade left carotid plaque. 2.  No evidence of intimal flap visualized on transverse or longitudinal ultrasonography. 3.  Continuous doppler audible flow signatures are appropriate for each carotid artery after endarterectomy and patch angioplasty.  SPECIMEN(S):  Carotid plaque (sent to Pathology)  INDICATIONS:   Xavier Caldwell is a 70 y.o. male who presents with left asymptomatic high grade carotid stenosis >80%.  I discussed with the patient the risks, benefits, and alternatives to carotid endarterectomy.  I discussed the procedural details of carotid endarterectomy with the patient.  The patient is aware that the risks of carotid endarterectomy include but are not limited to: bleeding, infection, stroke, myocardial infarction, death, cranial nerve injuries both temporary and permanent, neck hematoma, possible airway compromise, labile blood pressure post-operatively, cerebral hyperperfusion syndrome, and possible need for additional interventions in the future. The patient is aware of the risks and agrees to proceed forward with the procedure.  DESCRIPTION: After full informed written consent was obtained from the patient, the patient was brought back to the operating room and placed supine upon the operating table.  Prior to induction, the patient received IV antibiotics.  After obtaining adequate anesthesia, the patient was placed into semi-Fowler position with a shoulder roll in place and the patient's neck slightly hyperextended and rotated away from the surgical site.  The  patient was prepped in the standard fashion for a left carotid endarterectomy.  I made an incision anterior to the sternocleidomastoid muscle and dissected down through the subcutaneous tissue.  The platysma was opened with electrocautery.  I then used Bovie cautery and blunt dissection to dissect through the underlying platysma and to mobilize the anterior border of the sternocleidomastoid as well as the internal jugular vein laterally.  The facial vein was ligated with 3-0 silk and surgical clips and divided.  After identifying the carotid artery I used Metzenbaum scissors to bluntly dissect the common carotid artery and then controlled this with a umbilical tape.  At this point in time the patient was given 100 units/kg of IV heparin and we checked an ACT to ensure it was greater than 250.  I then carried my dissection cephalad and mobilized the external carotid artery and superior thyroid artery and controlled each of these with a vessel loop.  I then dissected out the internal carotid artery well past the distal plaque.  The internal carotid artery was then controlled with a umbilical tape as well. I was careful to identify the vagus nerve between the internal jugular and common carotid and this was presereved.  I was also careful to identify and preserve the hypoglossal nerve and this was preserved.    Once our ACT was confirmed, I proceeded by clamping the internal carotid artery with a angled bulldog clamp first.  The proximal common carotid artery was controlled with a angled debakey clamp.  The external carotid was controlled with a vessel loop.  I subsequently opened the common carotid artery with an 11 blade scalpel in longitudinal fashion and extended the arteriotomy with Potts scissors onto the ICA past the distal plaque.  There was robust pulsatile back-bleeding from the  ICA and I elected not to place a shunt.  I then used a Garment/textile technologist and performed a endarterectomy starting in the common  carotid artery.  The external carotid artery was endarterectomized with an eversion technique and I was careful to feather the distal ICA plaque.  The specimen was passed off the field.  The endarterectomy site was then flushed with heparinized saline and I was careful to ensure there were no flaps in the endarterectomy site.  I then brought a bovine carotid patch on the field and this was sewn in place with a running anastomosis using a 6-0 Prolene distally and a 5-0 proximal.  The bovine patch was trimmed accordingly.  The artery was flushed antegrade and retrograde prior to completion of the patch.  Once the patch was complete, I flushed up the external carotid artery first prior to releasing the internal carotid artery clamp.  An intraoperative duplex was performed that showed there was no evidence of any flaps.  Once I was happy with the intraoperative ultrasound the patient was given protamine for reversal.  I used surgicel snow to get hemostasis around the patch.  Ultimately the platysma was closed in running fashion with 3-0 Vicryl.  The skin was closed with a running 4-0 Monocryl.  Dermabond was applied with a dry sterile dressing.  The patient was awakened from anesthesia with no new neurological deficit and taken to PACU in stable condition.    COMPLICATIONS: None  CONDITION: Stable  Marty Heck, MD [facility] Office: [place]   09/11/2020, 9:45 AM

## 2020-09-12 ENCOUNTER — Encounter (HOSPITAL_COMMUNITY): Payer: Self-pay | Admitting: Vascular Surgery

## 2020-09-12 LAB — BASIC METABOLIC PANEL
Anion gap: 6 (ref 5–15)
BUN: 19 mg/dL (ref 8–23)
CO2: 23 mmol/L (ref 22–32)
Calcium: 8.6 mg/dL — ABNORMAL LOW (ref 8.9–10.3)
Chloride: 105 mmol/L (ref 98–111)
Creatinine, Ser: 1.23 mg/dL (ref 0.61–1.24)
GFR, Estimated: >60 mL/min (ref >60)
Glucose, Bld: 137 mg/dL — ABNORMAL HIGH (ref 70–99)
Potassium: 4 mmol/L (ref 3.5–5.1)
Sodium: 134 mmol/L — ABNORMAL LOW (ref 135–145)

## 2020-09-12 LAB — LIPID PANEL
Cholesterol: 111 mg/dL (ref 0–200)
HDL: 30 mg/dL — ABNORMAL LOW (ref >40)
LDL Cholesterol: 66 mg/dL (ref 0–99)
Total CHOL/HDL Ratio: 3.7 RATIO
Triglycerides: 77 mg/dL (ref <150)
VLDL: 15 mg/dL (ref 0–40)

## 2020-09-12 LAB — CBC
HCT: 32.4 % — ABNORMAL LOW (ref 39.0–52.0)
Hemoglobin: 10.7 g/dL — ABNORMAL LOW (ref 13.0–17.0)
MCH: 28.5 pg (ref 26.0–34.0)
MCHC: 33 g/dL (ref 30.0–36.0)
MCV: 86.2 fL (ref 80.0–100.0)
Platelets: 196 10*3/uL (ref 150–400)
RBC: 3.76 MIL/uL — ABNORMAL LOW (ref 4.22–5.81)
RDW: 14 % (ref 11.5–15.5)
WBC: 12.2 10*3/uL — ABNORMAL HIGH (ref 4.0–10.5)
nRBC: 0 % (ref 0.0–0.2)

## 2020-09-12 LAB — GLUCOSE, CAPILLARY: Glucose-Capillary: 122 mg/dL — ABNORMAL HIGH (ref 70–99)

## 2020-09-12 MED ORDER — APIXABAN 5 MG PO TABS: 5.0000 mg | ORAL_TABLET | Freq: Two times a day (BID) | ORAL | 6 refills | Status: DC

## 2020-09-12 MED ORDER — EZETIMIBE 10 MG PO TABS: 10.0000 mg | ORAL_TABLET | Freq: Every day | ORAL | 5 refills | Status: DC

## 2020-09-12 MED ORDER — OXYCODONE-ACETAMINOPHEN 5-325 MG PO TABS: 1.0000 | ORAL_TABLET | ORAL | 0 refills | Status: DC | PRN

## 2020-09-12 NOTE — Discharge Summary (Addendum)
Discharge Summary     Xavier Caldwell 08-21-1950 70 y.o. male  [id number]  Admission Date: 09/11/2020  Discharge Date: 09/12/2020 Physician: Marty Heck, MD  Admission Diagnosis: Left carotid artery stenosis [I65.22]   HPI:   This is a 70 y.o. male left asymptomatic high grade carotid stenosis (>80%)  Hospital Course:  The patient was admitted to the hospital and taken to the operating room on 09/11/2020 and underwent left carotid endarterectomy.    Findings: 1.  Calcified high grade left carotid plaque. 2.  No evidence of intimal flap visualized on transverse or longitudinal ultrasonography. 3.  Continuous doppler audible flow signatures are appropriate for each carotid artery after endarterectomy and patch angioplasty.  The pt tolerated the procedure well and was transported to the PACU in excellent condition.   By POD 1, the pt neuro status intact. He was hemodynamically stable. Incision without bleeding or hematoma. Afebrile. Tolerating diet.  The remainder of the hospital course consisted of increasing mobilization and increasing intake of solids without difficulty.   Recent Labs    09/12/20 0420  NA 134*  K 4.0  CL 105  CO2 23  GLUCOSE 137*  BUN 19  CALCIUM 8.6*   Recent Labs    09/12/20 0420  WBC 12.2*  HGB 10.7*  HCT 32.4*  PLT 196   No results for input(s): INR in the last 72 hours.   Discharge Instructions     Discharge patient   Complete by: As directed    Discharge disposition: 01-Home or Self Care   Discharge patient date: 09/12/2020       Discharge Diagnosis:  Left carotid artery stenosis [I65.22]  Secondary Diagnosis: Patient Active Problem List   Diagnosis Date Noted   Left carotid artery stenosis 09/11/2020   Hyperkalemia 03/09/2020   History of colonic polyps 10/05/2019   Chronic anticoagulation 10/05/2019   Controlled type 2 diabetes mellitus with stable proliferative retinopathy of both eyes, with long-term current  use of insulin ([place]) 06/14/2019   Left posterior capsular opacification 06/14/2019   Right posterior capsular opacification 06/14/2019   Primary open angle glaucoma of left eye, mild stage 06/14/2019   PVD (peripheral vascular disease) ([place]) 06/08/2019   Carotid stenosis 04/06/2019   Mixed hyperlipidemia 04/24/2018   Status post coronary artery stent placement    Paroxysmal atrial fibrillation ([facility])    Hematoma of groin 04/09/2018   Symptomatic bradycardia - - ischemic, s/p TPM 04/08/2018   Coronary artery disease involving native coronary artery of native heart with unstable angina pectoris ([place]) 04/08/2018   Presence of drug coated stent in right coronary artery 04/08/2018   Ventricular tachycardia ([place]) 04/08/2018   Acute ST elevation myocardial infarction (STEMI) of inferior wall ([place])    Cervicalgia 04/16/2017   Acute pain of left shoulder 04/16/2017   Hemorrhoids 11/18/2016   Taking multiple medications for chronic disease 09/18/2016   Constipation 09/18/2016   Uncontrolled type 2 diabetes mellitus with retinopathy, with long-term current use of insulin ([place]) 01/09/2016   Uncontrolled type 2 diabetes mellitus with complication, with long-term current use of insulin ([place]) 12/19/2014   Hyperlipidemia associated with type 2 diabetes mellitus ([place]) 12/19/2014   Essential hypertension, benign 12/19/2014   Past Medical History:  Diagnosis Date   Acute systolic (congestive) heart failure (HCC)    Anxiety    Arthritis    Atrial fibrillation (HCC)    Back pain    Chronic kidney disease    pt told by PA of Dr. [name] that  he had "stage 3 kidney so they had me stop taking Metformin"   Coronary artery disease    Diabetes mellitus without complication (HCC)    High cholesterol    History of gout    Hypertension    Myocardial infarction [facility]) 04/2018   Neuropathy    PAD (peripheral artery disease) (HCC)    Retinal detachment    One on the right and two on the left   Sleep apnea     Stop Bang score of 4   STEMI (ST elevation myocardial infarction) ([place])    04/08/2018 PCI/DES RCA   Stroke [facility])    February 2022   VT (ventricular tachycardia) ([place])     Allergies as of 09/12/2020   No Known Allergies      Medication List     TAKE these medications    ALPRAZolam 0.5 MG tablet Commonly known as: XANAX Take 0.5 mg by mouth 2 (two) times daily as needed for anxiety.   amLODipine 10 MG tablet Commonly known as: NORVASC Take 1 tablet (10 mg total) by mouth daily.   apixaban 5 MG Tabs tablet Commonly known as: Eliquis Take 1 tablet (5 mg total) by mouth 2 (two) times daily. Restart medication tomorrow, 09/12/2020 What changed: additional instructions   DULoxetine 60 MG capsule Commonly known as: CYMBALTA Take 60 mg by mouth daily with lunch.   ergocalciferol 1.25 MG (50000 UT) capsule Commonly known as: VITAMIN D2 Take 50,000 Units by mouth every Sunday.   ezetimibe 10 MG tablet Commonly known as: Zetia Take 1 tablet (10 mg total) by mouth daily.   gabapentin 300 MG capsule Commonly known as: NEURONTIN Take 300 mg by mouth 2 (two) times daily.   glimepiride 4 MG tablet Commonly known as: AMARYL Take 4 mg by mouth daily.   hydrALAZINE 50 MG tablet Commonly known as: APRESOLINE Take 1 tablet (50 mg total) by mouth 3 (three) times daily.   mupirocin ointment 2 % Commonly known as: BACTROBAN Apply 1 application topically daily as needed (melonoma).   OneTouch Ultra test strip Generic drug: glucose blood USE ONE TEST STRIP AS DIRECTED TWICE DAILY   oxyCODONE-acetaminophen 5-325 MG tablet Commonly known as: PERCOCET/ROXICET Take 1-2 tablets by mouth every 4 (four) hours as needed for moderate pain.   rOPINIRole 1 MG tablet Commonly known as: REQUIP Take 1 mg by mouth 4 (four) times daily.   SUPER B COMPLEX PO Take 1 tablet by mouth daily.       ASK your doctor about these medications    atorvastatin 80 MG tablet Commonly known as:  LIPITOR TAKE ONE TABLET BY MOUTH EVERYDAY AT BEDTIME   Basaglar KwikPen 100 UNIT/ML Inject 30 Units into the skin at bedtime.   carvedilol 12.5 MG tablet Commonly known as: COREG TAKE ONE TABLET BY MOUTH TWICE DAILY   nitroGLYCERIN 0.4 MG SL tablet Commonly known as: Nitrostat Place 1 tablet (0.4 mg total) under the tongue every 5 (five) minutes as needed.   timolol 0.5 % ophthalmic solution Commonly known as: TIMOPTIC INSTILL 1 DROP INTO EACH EYE TWICE DAILY   Unifine Pentips 31G X 5 MM Misc Generic drug: Insulin Pen Needle USE TO INJECT INSULIN AT BEDTIME         [facility] Discharge Instructions Carotid Endarterectomy (CEA)  Please refer to the following instructions for your post-procedure care. Your surgeon or physician assistant will discuss any changes with you.  Activity  You are encouraged to walk  as much as you can. You can slowly return to normal activities but must avoid strenuous activity and heavy lifting until your doctor tell you it's OK. Avoid activities such as vacuuming or swinging a golf club. You can drive after one week if you are comfortable and you are no longer taking prescription pain medications. It is normal to feel tired for serval weeks after your surgery. It is also normal to have difficulty with sleep habits, eating, and bowel movements after surgery. These will go away with time.  Bathing/Showering  You may shower after you come home. Do not soak in a bathtub, hot tub, or swim until the incision heals completely.  Incision Care  Shower every day. Clean your incision with mild soap and water. Pat the area dry with a clean towel. You do not need a bandage unless otherwise instructed. Do not apply any ointments or creams to your incision. You may have skin glue on your incision. Do not peel it off. It will come off on its own in about one week. Your incision may feel thickened and raised for several weeks after  your surgery. This is normal and the skin will soften over time. For Men Only: It's OK to shave around the incision but do not shave the incision itself for 2 weeks. It is common to have numbness under your chin that could last for several months.  Diet  Resume your normal diet. There are no special food restrictions following this procedure. A low fat/low cholesterol diet is recommended for all patients with vascular disease. In order to heal from your surgery, it is CRITICAL to get adequate nutrition. Your body requires vitamins, minerals, and protein. Vegetables are the best source of vitamins and minerals. Vegetables also provide the perfect balance of protein. Processed food has little nutritional value, so try to avoid this.  Medications  Resume taking all of your medications unless your doctor or physician assistant tells you not to.  If your incision is causing pain, you may take over-the- counter pain relievers such as acetaminophen (Tylenol). If you were prescribed a stronger pain medication, please be aware these medications can cause nausea and constipation.  Prevent nausea by taking the medication with a snack or meal. Avoid constipation by drinking plenty of fluids and eating foods with a high amount of fiber, such as fruits, vegetables, and grains.  Do not take Tylenol if you are taking prescription pain medications.  Follow Up  Our office will schedule a follow up appointment 2-3 weeks following discharge.  Please call us immediately for any of the following conditions  Increased pain, redness, drainage (pus) from your incision site. Fever of 101 degrees or higher. If you should develop stroke (slurred speech, difficulty swallowing, weakness on one side of your body, loss of vision) you should call 911 and go to the nearest emergency room.  Reduce your risk of vascular disease:  Stop smoking. If you would like help call [facility] at 1-800-QUIT-NOW 209-365-5645) or [facility] at 2256487664. Manage your cholesterol Maintain a desired weight Control your diabetes Keep your blood pressure down  If you have any questions, please call the office at [phone].  Prescriptions given: 1.   Roxicet #10 No Refill 2.  Zetia  Disposition: home  Patient's condition: is Excellent  Follow up: 1. Dr. Carlis Abbott in 2 weeks.   Risa Grill, PA-C [facility] [phone]   --- For [facility] Registry use ---   Modified Rankin score at D/C (  0-6): 0  IV medication needed for:  1. Hypertension: No 2. Hypotension: No  Post-op Complications: No  1. Post-op CVA or TIA: No  If yes: Event classification (right eye, left eye, right cortical, left cortical, verterobasilar, other):   If yes: Timing of event (intra-op, <6 hrs post-op, >=6 hrs post-op, unknown):   2. CN injury: No  If yes: CN  injuried   3. Myocardial infarction: No  If yes: Dx by (EKG or clinical, Troponin):   4.  CHF: No  5.  Dysrhythmia (new): No  6. Wound infection: No  7. Reperfusion symptoms: No  8. Return to OR: No  If yes: return to OR for (bleeding, neurologic, other CEA incision, other):   Discharge medications: Statin use:  No ASA use:  No   Beta blocker use:  No ACE-Inhibitor use:  No  ARB use:  No CCB use: No P2Y12 Antagonist use: No, '[ ]'$  Plavix, '[ ]'$  Plasugrel, '[ ]'$  Ticlopinine, '[ ]'$  Ticagrelor, '[ ]'$  Other, '[ ]'$  No for medical reason, '[ ]'$  Non-compliant, '[ ]'$  Not-indicated Anti-coagulant use:  yes, '[ ]'$  Warfarin, '[ ]'$  Rivaroxaban, '[ ]'$  Dabigatran, X apixaban ezetimibe

## 2020-09-12 NOTE — Progress Notes (Signed)
  Progress Note    09/12/2020 7:03 AM 1 Day Post-Op  Subjective:  No complaints. Has ambulated twice in hallway yesterday.   Vitals:   09/12/20 0300 09/12/20 0600  BP: 134/62 131/63  Pulse: 60 (!) 55  Resp: 18 16  Temp: 98.2 F (36.8 C) 97.8 F (36.6 C)  SpO2: 96% 94%    Physical Exam: General appearance: Awake, alert in no apparent distress Neurologic: Alert and oriented x4, tongue midline, face symmetric, grip strength 5/5 bilaterally Cardiac: Heart rate and rhythm are regular Respirations: Nonlabored Incision: Well approximated without bleeding or hematoma.  Subcutaneous tissue soft to palpation   CBC    Component Value Date/Time   WBC 12.2 (H) 09/12/2020 0420   RBC 3.76 (L) 09/12/2020 0420   HGB 10.7 (L) 09/12/2020 0420   HCT 32.4 (L) 09/12/2020 0420   PLT 196 09/12/2020 0420   MCV 86.2 09/12/2020 0420   MCH 28.5 09/12/2020 0420   MCHC 33.0 09/12/2020 0420   RDW 14.0 09/12/2020 0420   LYMPHSABS 1.4 03/09/2020 1350   MONOABS 0.6 03/09/2020 1350   EOSABS 0.2 03/09/2020 1350   BASOSABS 0.1 03/09/2020 1350    BMET    Component Value Date/Time   NA 134 (L) 09/12/2020 0420   NA 137 02/29/2020 1022   K 4.0 09/12/2020 0420   CL 105 09/12/2020 0420   CO2 23 09/12/2020 0420   GLUCOSE 137 (H) 09/12/2020 0420   BUN 19 09/12/2020 0420   BUN 37 (H) 02/29/2020 1022   CREATININE 1.23 09/12/2020 0420   CREATININE 0.83 04/01/2016 0843   CALCIUM 8.6 (L) 09/12/2020 0420   GFRNONAA >60 09/12/2020 0420   GFRAA 35 (L) 02/29/2020 1022     Intake/Output Summary (Last 24 hours) at 09/12/2020 0703 Last data filed at 09/11/2020 1700 Gross per 24 hour  Intake 840 ml  Output 445 ml  Net 395 ml    HOSPITAL MEDICATIONS Scheduled Meds:  amLODipine  10 mg Oral Daily   atorvastatin  80 mg Oral QHS   carvedilol  12.5 mg Oral BID WC   docusate sodium  100 mg Oral Daily   DULoxetine  60 mg Oral Q lunch   gabapentin  300 mg Oral BID   glimepiride  4 mg Oral Daily    hydrALAZINE  50 mg Oral TID   insulin aspart  0-15 Units Subcutaneous TID WC   insulin glargine-yfgn  50 Units Subcutaneous QHS   pantoprazole  40 mg Oral Daily   rOPINIRole  1 mg Oral QID   timolol  1 drop Both Eyes BID   Continuous Infusions:  sodium chloride     sodium chloride     magnesium sulfate bolus IVPB     PRN Meds:.sodium chloride, acetaminophen **OR** acetaminophen, ALPRAZolam, alum & mag hydroxide-simeth, benzocaine, bisacodyl, guaiFENesin-dextromethorphan, hydrALAZINE, labetalol, magnesium sulfate bolus IVPB, metoprolol tartrate, morphine injection, ondansetron, oxyCODONE-acetaminophen, phenol, polyethylene glycol, potassium chloride  Assessment and Plan: POD 1 Left CEA. Neuro intact. Hemodynamically stable. DC home. Continue statin. Start Eliquis tomorrow.   -DVT prophylaxis:  SCDs   Risa Grill, PA-C [facility] [phone] 09/12/2020  7:03 AM

## 2020-09-12 NOTE — Progress Notes (Signed)
PHARMACIST LIPID MONITORING   Xavier Caldwell is a 70 y.o. male admitted on 09/11/2020 with L carotid disease s/p endarterectomy.  Pharmacy has been consulted to optimize lipid-lowering therapy with the indication of secondary prevention for clinical ASCVD.  Recent Labs:  Lipid Panel (last 6 months):   Lab Results  Component Value Date   CHOL 111 09/12/2020   TRIG 77 09/12/2020   HDL 30 (L) 09/12/2020   CHOLHDL 3.7 09/12/2020   VLDL 15 09/12/2020   LDLCALC 66 09/12/2020    Hepatic function panel (last 6 months):   Lab Results  Component Value Date   AST 21 09/04/2020   ALT 23 09/04/2020   ALKPHOS 77 09/04/2020   BILITOT 0.9 09/04/2020    SCr (since admission):   Serum creatinine: 1.23 mg/dL 09/12/20 0420 Estimated creatinine clearance: 69.5 mL/min  Current therapy and lipid therapy tolerance Current lipid-lowering therapy: atorvastatin '80mg'$  Previous lipid-lowering therapies (if applicable): simvastatin Documented or reported allergies or intolerances to lipid-lowering therapies (if applicable): none  Assessment:   70 yo M admitted for L carotid endarterectomy. LDL is 66 on high intensity statin. Consider targeting < 55 given still required procedure. Patient does not recall how long he's been on atorvastatin. Per Chart review, patient was on simvastatin 2016-2020 then switched to atorvastatin '40mg'$ , which was increase dto '80mg'$  in July 2021. Per patient, started having calf cramping, R greater than L about 6 months ago, which coincides with when he went back to work and walks about 8 miles in 3 days. The cramping occurs when walking and requires him to stop and rest for a bit before moving on. Encouraged patient to discuss this with his PCP or cardiologist. This seems less likely to be a side effect of atorvastatin but is difficult to assess. After discussing carotid stenosis and LDL goals with patient, he is more than willing to start ezetimibe. Educated patient on signs of adverse  effects and to follow up with outpatient Cardiologist.  Plan:    1.Statin intensity (high intensity recommended for all patients regardless of the LDL):  No statin changes. The patient is already on a high intensity statin.  2.Add ezetimibe (if any one of the following):    On a high intensity statin with LDL > 55 requiring procedure  3.Refer to lipid clinic:   No  4.Follow-up with:  Cardiology provider - Carlyle Dolly, MD  5.Follow-up labs after discharge:  Changes in lipid therapy were made. Check a lipid panel in 8-12 weeks then annually.       [name], PharmD, BCPS, BCCP Clinical Pharmacist  Please check AMION for all [place] phone numbers After 10:00 PM, call [place] [phone]

## 2020-09-12 NOTE — Progress Notes (Signed)
[facility] [place]  Subjective  -stable overnight.  Wants to be discharged.  Walked in the hallway.  Neuro intact.   Objective 131/63 (!) 55 97.8 F (36.6 C) (Oral) 16 94%  Intake/Output Summary (Last 24 hours) at 09/12/2020 0727 Last data filed at 09/12/2020 0721 Gross per 24 hour  Intake 840 ml  Output 1045 ml  Net -205 ml   CN II-XII grossly intact.   Left neck c/d/I.  No hematoma.  Laboratory Lab Results: Recent Labs    09/12/20 0420  WBC 12.2*  HGB 10.7*  HCT 32.4*  PLT 196   BMET Recent Labs    09/12/20 0420  NA 134*  K 4.0  CL 105  CO2 23  GLUCOSE 137*  BUN 19  CREATININE 1.23  CALCIUM 8.6*    COAG Lab Results  Component Value Date   INR 1.3 (H) 09/04/2020   INR 1.4 (H) 03/23/2020   INR 1.3 (H) 03/09/2020   No results found for: PTT  Assessment/Planning:  Postop day 1 status post left carotid endarterectomy for an asymptomatic high-grade stenosis.  Patient is neuro intact.  Plan discharge today.  Neck looks good.  Discussed he restart his DOAC tomorrow.  We will arrange follow-up in 2 weeks and then make decision about him going back to work.   Marty Heck 09/12/2020 7:27 AM --

## 2020-09-14 LAB — POCT ACTIVATED CLOTTING TIME: Activated Clotting Time: 260 seconds

## 2020-09-18 ENCOUNTER — Telehealth: Payer: Self-pay | Admitting: Cardiology

## 2020-09-18 ENCOUNTER — Telehealth: Payer: Self-pay

## 2020-09-18 NOTE — Telephone Encounter (Signed)
Per phone call from pt's wife- pt had a really bad nose bleed yesterday morning when he got up and it started again this morning.  Pt had a carotid artery endarterectomy on 09/11/20.  Please call [phone]

## 2020-09-18 NOTE — Telephone Encounter (Signed)
Patient is s/p carotid artery endarterectomy on 09/11/20 and over the last two days has been experiencing nose bleeds when he takes Eliquis. Advised patient that he was on Eliquis for his atrial fibrillation and should call his cardiologist Dr. Harl Bowie for further instructions. Verbalized understanding.

## 2020-09-18 NOTE — Telephone Encounter (Signed)
Reports nose bleed yesterday morning and this morning. Reports nose bled for 20 minutes each time before stopping. Reports restarting eliquis 5 mg twice on Tuesday 09/12/2020. Advised that message would be sent to provider and PCP needed to be contacted to see why nose is bleeding since eliquis would not cause nose bleed but could make nose bleed worse. Verbalized understanding of plan.

## 2020-09-18 NOTE — Telephone Encounter (Signed)
Agree with pcp evalm, defer to them if he needs to see ENT   [name] MD

## 2020-09-19 NOTE — Telephone Encounter (Signed)
Patient and wife informed via vm.

## 2020-09-19 NOTE — Telephone Encounter (Signed)
Patient returned call to [place].

## 2020-09-20 DIAGNOSIS — R040 Epistaxis: Secondary | ICD-10-CM | POA: Diagnosis not present

## 2020-09-20 DIAGNOSIS — E6609 Other obesity due to excess calories: Secondary | ICD-10-CM | POA: Diagnosis not present

## 2020-09-20 DIAGNOSIS — Z6831 Body mass index (BMI) 31.0-31.9, adult: Secondary | ICD-10-CM | POA: Diagnosis not present

## 2020-09-20 DIAGNOSIS — J3489 Other specified disorders of nose and nasal sinuses: Secondary | ICD-10-CM | POA: Diagnosis not present

## 2020-09-21 ENCOUNTER — Encounter: Payer: Self-pay | Admitting: Endocrinology

## 2020-09-21 ENCOUNTER — Ambulatory Visit (INDEPENDENT_AMBULATORY_CARE_PROVIDER_SITE_OTHER): Payer: Medicare HMO | Admitting: Endocrinology

## 2020-09-21 ENCOUNTER — Other Ambulatory Visit: Payer: Self-pay

## 2020-09-21 VITALS — BP 136/50 | HR 70 | Ht 71.0 in | Wt 222.4 lb

## 2020-09-21 DIAGNOSIS — E1165 Type 2 diabetes mellitus with hyperglycemia: Secondary | ICD-10-CM | POA: Diagnosis not present

## 2020-09-21 DIAGNOSIS — E0590 Thyrotoxicosis, unspecified without thyrotoxic crisis or storm: Secondary | ICD-10-CM | POA: Diagnosis not present

## 2020-09-21 DIAGNOSIS — E119 Type 2 diabetes mellitus without complications: Secondary | ICD-10-CM

## 2020-09-21 LAB — POCT GLYCOSYLATED HEMOGLOBIN (HGB A1C): Hemoglobin A1C: 9.4 % — AB (ref 4.0–5.6)

## 2020-09-21 LAB — T4, FREE: Free T4: 0.82 ng/dL (ref 0.60–1.60)

## 2020-09-21 LAB — TSH: TSH: 0.17 u[IU]/mL — ABNORMAL LOW (ref 0.35–5.50)

## 2020-09-21 MED ORDER — INSULIN NPH (HUMAN) (ISOPHANE) 100 UNIT/ML ~~LOC~~ SUSP: 40.0000 [IU] | SUBCUTANEOUS | 11 refills | Status: DC

## 2020-09-21 NOTE — Patient Instructions (Addendum)
good diet and exercise significantly improve the control of your diabetes.  please let me know if you wish to be referred to a dietician.  high blood sugar is very risky to your health.  you should see an eye doctor and dentist every year.  It is very important to get all recommended vaccinations.  Controlling your blood pressure and cholesterol drastically reduces the damage diabetes does to your body.  Those who smoke should quit.  Please discuss these with your doctor.  check your blood sugar twice a day.  vary the time of day when you check, between before the 3 meals, and at bedtime.  also check if you have symptoms of your blood sugar being too high or too low.  please keep a record of the readings and bring it to your next appointment here (or you can bring the meter itself).  You can write it on any piece of paper.  please call us sooner if your blood sugar goes below 70, or if most of your readings are over 200.    We will need to take this complex situation in stages For now, please change the insulin to NPH, 40 units each morning.  On this type of insulin schedule, you should eat meals on a regular schedule.  If a meal is missed or significantly delayed, your blood sugar could go low. Please continue the same glimepiride for now.  Please ask you insurance how much a medication such as "Ozempic," "Trulicity," or "Darcel Bayley" is.  Blood tests are requested for you today.  We'll let you know about the results.  Please come back for a follow-up appointment in 1 month.

## 2020-09-21 NOTE — Progress Notes (Signed)
Subjective:    Patient ID: Xavier Caldwell, male    DOB: 11-Sep-1950, 70 y.o.   MRN: [id number]  HPI pt is referred by Dr Gerarda Fraction, for diabetes.  Pt states DM was dx'ed in 0000000; it is complicated by CAD, CRI, PAD, and DR; he has been on insulin since 2013; pt says his diet and exercise are good; he has never had pancreatitis, pancreatic surgery, severe hypoglycemia or DKA.  He takes basaglar and amaryl.  He says cbg varies from 75-147.  He checks fasting only.  Pt requests generic meds only. Past Medical History:  Diagnosis Date   Acute systolic (congestive) heart failure (HCC)    Anxiety    Arthritis    Atrial fibrillation (HCC)    Back pain    Chronic kidney disease    pt told by PA of Dr. [name] that he had "stage 3 kidney so they had me stop taking Metformin"   Coronary artery disease    Diabetes mellitus without complication (HCC)    High cholesterol    History of gout    Hypertension    Myocardial infarction [facility]) 04/2018   Neuropathy    PAD (peripheral artery disease) (HCC)    Retinal detachment    One on the right and two on the left   Sleep apnea    Stop Bang score of 4   STEMI (ST elevation myocardial infarction) ([place])    04/08/2018 PCI/DES RCA   Stroke [facility])    February 2022   VT (ventricular tachycardia) [facility])     Past Surgical History:  Procedure Laterality Date   APPENDECTOMY     CARDIOVERSION N/A 04/13/2018   Procedure: CARDIOVERSION;  Surgeon: Skeet Latch, MD;  Location: [place];  Service: Cardiovascular;  Laterality: N/A;   CATARACT EXTRACTION W/PHACO Right 04/25/2014   Procedure: CATARACT EXTRACTION PHACO AND INTRAOCULAR LENS PLACEMENT ([place]);  Surgeon: Williams Che, MD;  [facility]: [facility];  Service: Ophthalmology;  Laterality: Right;  CDE:4.70   CATARACT EXTRACTION W/PHACO Left 07/11/2014   Procedure: CATARACT EXTRACTION PHACO AND INTRAOCULAR LENS PLACEMENT LEFT EYE CDE=8.32;  Surgeon: Williams Che, MD;  Location: [facility];  Service: Ophthalmology;   Laterality: Left;   COLONOSCOPY WITH PROPOFOL N/A 09/23/2016   Rourk: 9 polyps removed, multiple tubular adenomas. recommended 3 yr surveillance   COLONOSCOPY WITH PROPOFOL N/A 12/27/2019   ten sessile polyps, 4-9 mm in size in descending, hepatic flexure, and cecum, 11 mm polyp in sigmoid, three sessile polyps in rectum and mid rectum 2-3 mm in size. Tubular adenomas and hyperplastic. 3 year surveillance   CORONARY/GRAFT ACUTE MI REVASCULARIZATION N/A 04/07/2018   Procedure: Coronary/Graft Acute MI Revascularization;  Surgeon: Burnell Blanks, MD;  Location: [place] [facility];  Service: Cardiovascular;  Laterality: N/A;   ENDARTERECTOMY Right 03/29/2020   Procedure: RIGHT CAROTID ARTERY ENDARTERECTOMY WITH PATCH ANGIOPLASTY;  Surgeon: Marty Heck, MD;  Location: [place];  Service: Vascular;  Laterality: Right;   ENDARTERECTOMY Left 09/11/2020   Procedure: ENDARTERECTOMY CAROTID LEFT;  Surgeon: Marty Heck, MD;  Location: [place];  Service: Vascular;  Laterality: Left;   LEFT HEART CATH AND CORONARY ANGIOGRAPHY N/A 04/07/2018   Procedure: LEFT HEART CATH AND CORONARY ANGIOGRAPHY;  Surgeon: Burnell Blanks, MD;  Location: [place] [facility];  Service: Cardiovascular;  Laterality: N/A;   MELANOMA EXCISION  07/2019   PATCH ANGIOPLASTY Left 09/11/2020   Procedure: PATCH ANGIOPLASTY;  Surgeon: Marty Heck, MD;  Location: [place];  Service: Vascular;  Laterality: Left;   POLYPECTOMY  09/23/2016   Procedure: POLYPECTOMY;  Surgeon: Daneil Dolin, MD;  [facility];  Service: Endoscopy;;  colon   POLYPECTOMY  12/27/2019   Procedure: POLYPECTOMY;  Surgeon: Daneil Dolin, MD;  [facility];  Service: Endoscopy;;   RETINAL DETACHMENT SURGERY      Social History   Socioeconomic History   Marital status: Married    Spouse name: Not on file   Number of children: Not on file   Years of education: Not on file   Highest education level: Not on file   Occupational History   Not on file  Tobacco Use   Smoking status: Former    Packs/day: 1.50    Years: 30.00    Pack years: 45.00    Types: Cigarettes    Quit date: 07/05/2012    Years since quitting: 8.2   Smokeless tobacco: Never  Vaping Use   Vaping Use: Never used  Substance and Sexual Activity   Alcohol use: No   Drug use: No   Sexual activity: Not Currently    Birth control/protection: None  Other Topics Concern   Not on file  Social History Narrative   Not on file   Social Determinants of Health   Financial Resource Strain: Not on file  Food Insecurity: Not on file  Transportation Needs: Not on file  Physical Activity: Not on file  Stress: Not on file  Social Connections: Not on file  Intimate Partner Violence: Not on file    Current Outpatient Medications on File Prior to Visit  Medication Sig Dispense Refill   ALPRAZolam (XANAX) 0.5 MG tablet Take 0.5 mg by mouth 2 (two) times daily as needed for anxiety.     amLODipine (NORVASC) 10 MG tablet Take 1 tablet (10 mg total) by mouth daily. 30 tablet 1   apixaban (ELIQUIS) 5 MG TABS tablet Take 1 tablet (5 mg total) by mouth 2 (two) times daily. Restart medication tomorrow, 09/12/2020 60 tablet 6   atorvastatin (LIPITOR) 80 MG tablet TAKE ONE TABLET BY MOUTH EVERYDAY AT BEDTIME (Patient taking differently: Take 80 mg by mouth at bedtime.) 90 tablet 2   B Complex-C (SUPER B COMPLEX PO) Take 1 tablet by mouth daily.      carvedilol (COREG) 12.5 MG tablet TAKE ONE TABLET BY MOUTH TWICE DAILY (Patient taking differently: Take 12.5 mg by mouth 2 (two) times daily with a meal.) 180 tablet 1   DULoxetine (CYMBALTA) 60 MG capsule Take 60 mg by mouth daily with lunch.      ergocalciferol (VITAMIN D2) 50000 units capsule Take 50,000 Units by mouth every Sunday.      ezetimibe (ZETIA) 10 MG tablet Take 1 tablet (10 mg total) by mouth daily. 30 tablet 5   gabapentin (NEURONTIN) 300 MG capsule Take 300 mg by mouth 2 (two) times  daily.      glimepiride (AMARYL) 4 MG tablet Take 4 mg by mouth daily.     glucose blood (ONETOUCH ULTRA) test strip USE ONE TEST STRIP AS DIRECTED TWICE DAILY 100 each 0   hydrALAZINE (APRESOLINE) 50 MG tablet Take 1 tablet (50 mg total) by mouth 3 (three) times daily. 270 tablet 3   mupirocin ointment (BACTROBAN) 2 % Apply 1 application topically daily as needed (melonoma).     nitroGLYCERIN (NITROSTAT) 0.4 MG SL tablet Place 1 tablet (0.4 mg total) under the tongue every 5 (five) minutes as needed. (Patient taking  differently: Place 0.4 mg under the tongue every 5 (five) minutes as needed for chest pain.) 25 tablet 2   oxyCODONE-acetaminophen (PERCOCET/ROXICET) 5-325 MG tablet Take 1-2 tablets by mouth every 4 (four) hours as needed for moderate pain. 10 tablet 0   rOPINIRole (REQUIP) 1 MG tablet Take 1 mg by mouth 4 (four) times daily.      timolol (TIMOPTIC) 0.5 % ophthalmic solution INSTILL 1 DROP INTO EACH EYE TWICE DAILY (Patient taking differently: Place 1 drop into both eyes 2 (two) times daily.) 10 mL 10   UNIFINE PENTIPS 31G X 5 MM MISC USE TO INJECT INSULIN AT BEDTIME (Patient taking differently: at bedtime.) 100 each 5   No current facility-administered medications on file prior to visit.    No Known Allergies  Family History  Problem Relation Age of Onset   Hypertension Father    Colon cancer Neg Hx    Diabetes Neg Hx     BP (!) 136/50 (BP Location: Right Arm, Patient Position: Sitting, Cuff Size: Large)   Pulse 70   Ht '5\' 11"'$  (1.803 m)   Wt 222 lb 6.4 oz (100.9 kg)   SpO2 98%   BMI 31.02 kg/m    Review of Systems denies weight loss, n/v, memory loss, and depression.     Objective:   Physical Exam Pulses: dorsalis pedis intact bilat.   MSK: no deformity of the feet CV: no leg edema Skin:  no ulcer on the feet.  normal color and temp on the feet. Neuro: sensation is intact to touch on the feet   Lab Results  Component Value Date   CREATININE 1.23 09/12/2020    BUN 19 09/12/2020   NA 134 (L) 09/12/2020   K 4.0 09/12/2020   CL 105 09/12/2020   CO2 23 09/12/2020   Lab Results  Component Value Date   HGBA1C 9.4 (A) 09/21/2020   Lab Results  Component Value Date   TSH 0.166 (L) 02/29/2020   T4TOTAL 9.2 04/17/2018   I have reviewed outside records, and summarized: Pt was noted to have elevated A1c, and referred here.  Pt was also noted to have h/o abnormal TFT in the past.      Assessment & Plan:  Insulin-requiring type 2 DM: uncontrolled Hyperthyroidism, mild.  Recheck today  Patient Instructions  good diet and exercise significantly improve the control of your diabetes.  please let me know if you wish to be referred to a dietician.  high blood sugar is very risky to your health.  you should see an eye doctor and dentist every year.  It is very important to get all recommended vaccinations.  Controlling your blood pressure and cholesterol drastically reduces the damage diabetes does to your body.  Those who smoke should quit.  Please discuss these with your doctor.  check your blood sugar twice a day.  vary the time of day when you check, between before the 3 meals, and at bedtime.  also check if you have symptoms of your blood sugar being too high or too low.  please keep a record of the readings and bring it to your next appointment here (or you can bring the meter itself).  You can write it on any piece of paper.  please call us sooner if your blood sugar goes below 70, or if most of your readings are over 200.    We will need to take this complex situation in stages For now, please change the insulin to NPH,  40 units each morning.  On this type of insulin schedule, you should eat meals on a regular schedule.  If a meal is missed or significantly delayed, your blood sugar could go low. Please continue the same glimepiride for now.  Please ask you insurance how much a medication such as "Ozempic," "Trulicity," or "Darcel Bayley" is.  Blood tests  are requested for you today.  We'll let you know about the results.  Please come back for a follow-up appointment in 1 month.

## 2020-09-26 ENCOUNTER — Encounter: Payer: Self-pay | Admitting: Vascular Surgery

## 2020-09-26 ENCOUNTER — Ambulatory Visit (INDEPENDENT_AMBULATORY_CARE_PROVIDER_SITE_OTHER): Payer: Medicare HMO | Admitting: Vascular Surgery

## 2020-09-26 ENCOUNTER — Other Ambulatory Visit: Payer: Self-pay

## 2020-09-26 VITALS — BP 149/83 | HR 67 | Temp 98.0°F | Resp 18 | Ht 71.0 in | Wt 219.0 lb

## 2020-09-26 DIAGNOSIS — I6523 Occlusion and stenosis of bilateral carotid arteries: Secondary | ICD-10-CM

## 2020-09-26 NOTE — Progress Notes (Signed)
Patient name: Xavier Caldwell MRN: [id number] DOB: 1950-09-06 Sex: male  REASON FOR VISIT: Postop check status post left carotid endarterectomy  HPI: Xavier Caldwell is a 70 y.o. male with history of coronary disease status post NSTEMI complicated by V. tach arrest, A. fib on Eliquis, carotid disease, HTN, HLD that presents for postop check after left carotid endarterectomy.  Most recently underwent left carotid for an asymptomatic high-grade stenosis on 09/11/2020.  He reports no issues at home.  He is otherwise neurologically at his baseline.  He previously underwent right carotid endarterectomy on 03/29/2020 for symptomatic high-grade stenosis.  Prior to surgery he had presented to the [facility] ED with some vague lower extremity weakness that progressed to left upper and lower extremity weakness concerning for TIA.  CTA neck and duplex suggested a high-grade bilateral ICA stenosis.   Past Medical History:  Diagnosis Date   Acute systolic (congestive) heart failure (HCC)    Anxiety    Arthritis    Atrial fibrillation (HCC)    Back pain    Chronic kidney disease    pt told by PA of Dr. [name] that he had "stage 3 kidney so they had me stop taking Metformin"   Coronary artery disease    Diabetes mellitus without complication (HCC)    High cholesterol    History of gout    Hypertension    Myocardial infarction [facility]) 04/2018   Neuropathy    PAD (peripheral artery disease) (HCC)    Retinal detachment    One on the right and two on the left   Sleep apnea    Stop Bang score of 4   STEMI (ST elevation myocardial infarction) ([place])    04/08/2018 PCI/DES RCA   Stroke [facility])    February 2022   VT (ventricular tachycardia) [facility])     Past Surgical History:  Procedure Laterality Date   APPENDECTOMY     CARDIOVERSION N/A 04/13/2018   Procedure: CARDIOVERSION;  Surgeon: Skeet Latch, MD;  Location: [place];  Service: Cardiovascular;  Laterality: N/A;   CATARACT EXTRACTION W/PHACO Right  04/25/2014   Procedure: CATARACT EXTRACTION PHACO AND INTRAOCULAR LENS PLACEMENT ([place]);  Surgeon: Williams Che, MD;  [facility];  Service: Ophthalmology;  Laterality: Right;  CDE:4.70   CATARACT EXTRACTION W/PHACO Left 07/11/2014   Procedure: CATARACT EXTRACTION PHACO AND INTRAOCULAR LENS PLACEMENT LEFT EYE CDE=8.32;  Surgeon: Williams Che, MD;  [facility]: [facility];  Service: Ophthalmology;  Laterality: Left;   COLONOSCOPY WITH PROPOFOL N/A 09/23/2016   Rourk: 9 polyps removed, multiple tubular adenomas. recommended 3 yr surveillance   COLONOSCOPY WITH PROPOFOL N/A 12/27/2019   ten sessile polyps, 4-9 mm in size in descending, hepatic flexure, and cecum, 11 mm polyp in sigmoid, three sessile polyps in rectum and mid rectum 2-3 mm in size. Tubular adenomas and hyperplastic. 3 year surveillance   CORONARY/GRAFT ACUTE MI REVASCULARIZATION N/A 04/07/2018   Procedure: Coronary/Graft Acute MI Revascularization;  Surgeon: Burnell Blanks, MD;  Location: [place] [facility];  Service: Cardiovascular;  Laterality: N/A;   ENDARTERECTOMY Right 03/29/2020   Procedure: RIGHT CAROTID ARTERY ENDARTERECTOMY WITH PATCH ANGIOPLASTY;  Surgeon: Marty Heck, MD;  Location: [place];  Service: Vascular;  Laterality: Right;   ENDARTERECTOMY Left 09/11/2020   Procedure: ENDARTERECTOMY CAROTID LEFT;  Surgeon: Marty Heck, MD;  Location: [place];  Service: Vascular;  Laterality: Left;   LEFT HEART CATH AND CORONARY ANGIOGRAPHY N/A 04/07/2018   Procedure: LEFT HEART CATH AND  CORONARY ANGIOGRAPHY;  Surgeon: Burnell Blanks, MD;  Location: [place] [facility];  Service: Cardiovascular;  Laterality: N/A;   MELANOMA EXCISION  07/2019   PATCH ANGIOPLASTY Left 09/11/2020   Procedure: PATCH ANGIOPLASTY;  Surgeon: Marty Heck, MD;  Location[facility];  Service: Vascular;  Laterality: Left;   POLYPECTOMY  09/23/2016   Procedure: POLYPECTOMY;  Surgeon: Daneil Dolin, MD;  [facility];   Service: Endoscopy;;  colon   POLYPECTOMY  12/27/2019   Procedure: POLYPECTOMY;  Surgeon: Daneil Dolin, MD;  [facility];  Service: Endoscopy;;   RETINAL DETACHMENT SURGERY      Family History  Problem Relation Age of Onset   Hypertension Father    Colon cancer Neg Hx    Diabetes Neg Hx     SOCIAL HISTORY: Social History   Tobacco Use   Smoking status: Former    Packs/day: 1.50    Years: 30.00    Pack years: 45.00    Types: Cigarettes    Quit date: 07/05/2012    Years since quitting: 8.2   Smokeless tobacco: Never  Substance Use Topics   Alcohol use: No    No Known Allergies  Current Outpatient Medications  Medication Sig Dispense Refill   ALPRAZolam (XANAX) 0.5 MG tablet Take 0.5 mg by mouth 2 (two) times daily as needed for anxiety.     amLODipine (NORVASC) 10 MG tablet Take 1 tablet (10 mg total) by mouth daily. 30 tablet 1   apixaban (ELIQUIS) 5 MG TABS tablet Take 1 tablet (5 mg total) by mouth 2 (two) times daily. Restart medication tomorrow, 09/12/2020 60 tablet 6   atorvastatin (LIPITOR) 80 MG tablet TAKE ONE TABLET BY MOUTH EVERYDAY AT BEDTIME (Patient taking differently: Take 80 mg by mouth at bedtime.) 90 tablet 2   B Complex-C (SUPER B COMPLEX PO) Take 1 tablet by mouth daily.      carvedilol (COREG) 12.5 MG tablet TAKE ONE TABLET BY MOUTH TWICE DAILY (Patient taking differently: Take 12.5 mg by mouth 2 (two) times daily with a meal.) 180 tablet 1   DULoxetine (CYMBALTA) 60 MG capsule Take 60 mg by mouth daily with lunch.      ergocalciferol (VITAMIN D2) 50000 units capsule Take 50,000 Units by mouth every Sunday.      ezetimibe (ZETIA) 10 MG tablet Take 1 tablet (10 mg total) by mouth daily. 30 tablet 5   gabapentin (NEURONTIN) 300 MG capsule Take 300 mg by mouth 2 (two) times daily.      glimepiride (AMARYL) 4 MG tablet Take 4 mg by mouth daily.     glucose blood (ONETOUCH ULTRA) test strip USE ONE TEST STRIP AS DIRECTED TWICE DAILY 100 each 0    hydrALAZINE (APRESOLINE) 50 MG tablet Take 1 tablet (50 mg total) by mouth 3 (three) times daily. 270 tablet 3   insulin NPH Human (NOVOLIN N RELION) 100 UNIT/ML injection Inject 0.4 mLs (40 Units total) into the skin every morning. And pen needles 1/day 20 mL 11   mupirocin ointment (BACTROBAN) 2 % Apply 1 application topically daily as needed (melonoma).     nitroGLYCERIN (NITROSTAT) 0.4 MG SL tablet Place 1 tablet (0.4 mg total) under the tongue every 5 (five) minutes as needed. (Patient taking differently: Place 0.4 mg under the tongue every 5 (five) minutes as needed for chest pain.) 25 tablet 2   rOPINIRole (REQUIP) 1 MG tablet Take 1 mg by mouth 4 (four) times daily.  timolol (TIMOPTIC) 0.5 % ophthalmic solution INSTILL 1 DROP INTO EACH EYE TWICE DAILY (Patient taking differently: Place 1 drop into both eyes 2 (two) times daily.) 10 mL 10   UNIFINE PENTIPS 31G X 5 MM MISC USE TO INJECT INSULIN AT BEDTIME (Patient taking differently: at bedtime.) 100 each 5   oxyCODONE-acetaminophen (PERCOCET/ROXICET) 5-325 MG tablet Take 1-2 tablets by mouth every 4 (four) hours as needed for moderate pain. (Patient not taking: Reported on 09/26/2020) 10 tablet 0   No current facility-administered medications for this visit.    REVIEW OF SYSTEMS:  '[X]'$  denotes positive finding, '[ ]'$  denotes negative finding Cardiac  Comments:  Chest pain or chest pressure:    Shortness of breath upon exertion:    Short of breath when lying flat:    Irregular heart rhythm:        Vascular    Pain in calf, thigh, or hip brought on by ambulation:    Pain in feet at night that wakes you up from your sleep:     Blood clot in your veins:    Leg swelling:         Pulmonary    Oxygen at home:    Productive cough:     Wheezing:         Neurologic    Sudden weakness in arms or legs:     Sudden numbness in arms or legs:     Sudden onset of difficulty speaking or slurred speech:    Temporary loss of vision in one eye:      Problems with dizziness:         Gastrointestinal    Blood in stool:     Vomited blood:         Genitourinary    Burning when urinating:     Blood in urine:        Psychiatric    Major depression:         Hematologic    Bleeding problems:    Problems with blood clotting too easily:        Skin    Rashes or ulcers:        Constitutional    Fever or chills:      PHYSICAL EXAM: Vitals:   09/26/20 0852 09/26/20 0854  BP: (!) 158/78 (!) 149/83  Pulse: 67 67  Resp: 18   Temp: 98 F (36.7 C)   TempSrc: Temporal   Weight: 219 lb (99.3 kg)   Height: '5\' 11"'$  (1.803 m)     GENERAL: The patient is a well-nourished male, in no acute distress. The vital signs are documented above. CARDIAC: There is a regular rate and rhythm.  VASCULAR:  Left neck incision healing well without issues MUSCULOSKELETAL: There are no major deformities or cyanosis. NEUROLOGIC: No focal weakness or paresthesias are detected.  Nerves II through XII grossly intact. SKIN: There are no ulcers or rashes noted. PSYCHIATRIC: The patient has a normal affect.  DATA:   None  Assessment/Plan:  70 year old male presents for postop check after left carotid endarterectomy on 09/11/2020 for an asymptomatic high-grade stenosis.  He looks good and has recovered well from surgery.  Of note he previously had a right carotid endarterectomy by myself on 03/29/2020 for a high-grade symptomatic stenosis.  I will arrange follow-up in 9 months with carotid duplex for ongoing surveillance.  Discussed he call with questions or concerns.  Discussed he take an 81 mg aspirin for my standpoint.  Marty Heck,  MD [facility] [place] Office: [phone]

## 2020-09-27 ENCOUNTER — Other Ambulatory Visit: Payer: Self-pay

## 2020-09-27 DIAGNOSIS — I6523 Occlusion and stenosis of bilateral carotid arteries: Secondary | ICD-10-CM

## 2020-10-04 DIAGNOSIS — I5023 Acute on chronic systolic (congestive) heart failure: Secondary | ICD-10-CM | POA: Diagnosis not present

## 2020-10-04 DIAGNOSIS — E782 Mixed hyperlipidemia: Secondary | ICD-10-CM | POA: Diagnosis not present

## 2020-10-04 DIAGNOSIS — I130 Hypertensive heart and chronic kidney disease with heart failure and stage 1 through stage 4 chronic kidney disease, or unspecified chronic kidney disease: Secondary | ICD-10-CM | POA: Diagnosis not present

## 2020-10-04 DIAGNOSIS — E1122 Type 2 diabetes mellitus with diabetic chronic kidney disease: Secondary | ICD-10-CM | POA: Diagnosis not present

## 2020-10-26 ENCOUNTER — Ambulatory Visit: Payer: Medicare HMO | Admitting: Endocrinology

## 2020-11-03 DIAGNOSIS — I130 Hypertensive heart and chronic kidney disease with heart failure and stage 1 through stage 4 chronic kidney disease, or unspecified chronic kidney disease: Secondary | ICD-10-CM | POA: Diagnosis not present

## 2020-11-03 DIAGNOSIS — E1122 Type 2 diabetes mellitus with diabetic chronic kidney disease: Secondary | ICD-10-CM | POA: Diagnosis not present

## 2020-11-03 DIAGNOSIS — I5023 Acute on chronic systolic (congestive) heart failure: Secondary | ICD-10-CM | POA: Diagnosis not present

## 2020-11-03 DIAGNOSIS — E782 Mixed hyperlipidemia: Secondary | ICD-10-CM | POA: Diagnosis not present

## 2020-11-06 ENCOUNTER — Ambulatory Visit: Payer: Medicare HMO | Admitting: Endocrinology

## 2020-11-17 ENCOUNTER — Other Ambulatory Visit: Payer: Self-pay

## 2020-11-17 ENCOUNTER — Ambulatory Visit (INDEPENDENT_AMBULATORY_CARE_PROVIDER_SITE_OTHER): Payer: Medicare HMO | Admitting: Endocrinology

## 2020-11-17 VITALS — BP 130/60 | HR 63 | Ht 71.0 in | Wt 223.0 lb

## 2020-11-17 DIAGNOSIS — N1831 Chronic kidney disease, stage 3a: Secondary | ICD-10-CM

## 2020-11-17 DIAGNOSIS — E1122 Type 2 diabetes mellitus with diabetic chronic kidney disease: Secondary | ICD-10-CM | POA: Diagnosis not present

## 2020-11-17 DIAGNOSIS — E119 Type 2 diabetes mellitus without complications: Secondary | ICD-10-CM

## 2020-11-17 DIAGNOSIS — Z794 Long term (current) use of insulin: Secondary | ICD-10-CM

## 2020-11-17 LAB — POCT GLYCOSYLATED HEMOGLOBIN (HGB A1C): Hemoglobin A1C: 9.6 % — AB (ref 4.0–5.6)

## 2020-11-17 MED ORDER — TRULICITY 0.75 MG/0.5ML ~~LOC~~ SOAJ: 0.7500 mg | SUBCUTANEOUS | 3 refills | Status: DC

## 2020-11-17 MED ORDER — INSULIN NPH (HUMAN) (ISOPHANE) 100 UNIT/ML ~~LOC~~ SUSP: 30.0000 [IU] | SUBCUTANEOUS | 11 refills | Status: DC

## 2020-11-17 NOTE — Progress Notes (Signed)
Subjective:    Patient ID: Xavier Caldwell, male    DOB: 11-29-1950, 70 y.o.   MRN: [id number]  HPI Pt returns for f/u of diabetes mellitus: DM type: Insulin-requiring type 2 Dx'ed: 4098 Complications: CAD, CRI, PAD, and DR Therapy: insulin since 2013 DKA: never Severe hypoglycemia: never Pancreatitis: never Pancreatic imaging: none known SDOH:  Pt requests generic meds only. Other: he declines multiple daily injections. Interval history: He works variable shifts.  He brings a record of his cbg's which I have reviewed today.  CBG varies from 70-122.   Past Medical History:  Diagnosis Date   Acute systolic (congestive) heart failure (HCC)    Anxiety    Arthritis    Atrial fibrillation (HCC)    Back pain    Chronic kidney disease    pt told by PA of Dr. [name] that he had "stage 3 kidney so they had me stop taking Metformin"   Coronary artery disease    Diabetes mellitus without complication (HCC)    High cholesterol    History of gout    Hypertension    Myocardial infarction [facility]) 04/2018   Neuropathy    PAD (peripheral artery disease) (HCC)    Retinal detachment    One on the right and two on the left   Sleep apnea    Stop Bang score of 4   STEMI (ST elevation myocardial infarction) ([place])    04/08/2018 PCI/DES RCA   Stroke [facility])    February 2022   VT (ventricular tachycardia) [facility])     Past Surgical History:  Procedure Laterality Date   APPENDECTOMY     CARDIOVERSION N/A 04/13/2018   Procedure: CARDIOVERSION;  Surgeon: Skeet Latch, MD;  Location: [place];  Service: Cardiovascular;  Laterality: N/A;   CATARACT EXTRACTION W/PHACO Right 04/25/2014   Procedure: CATARACT EXTRACTION PHACO AND INTRAOCULAR LENS PLACEMENT ([place]);  Surgeon: Williams Che, MD;  [facility];  Service: Ophthalmology;  Laterality: Right;  CDE:4.70   CATARACT EXTRACTION W/PHACO Left 07/11/2014   Procedure: CATARACT EXTRACTION PHACO AND INTRAOCULAR LENS PLACEMENT LEFT EYE CDE=8.32;   Surgeon: Williams Che, MD;  [facility];  Service: Ophthalmology;  Laterality: Left;   COLONOSCOPY WITH PROPOFOL N/A 09/23/2016   Rourk: 9 polyps removed, multiple tubular adenomas. recommended 3 yr surveillance   COLONOSCOPY WITH PROPOFOL N/A 12/27/2019   ten sessile polyps, 4-9 mm in size in descending, hepatic flexure, and cecum, 11 mm polyp in sigmoid, three sessile polyps in rectum and mid rectum 2-3 mm in size. Tubular adenomas and hyperplastic. 3 year surveillance   CORONARY/GRAFT ACUTE MI REVASCULARIZATION N/A 04/07/2018   Procedure: Coronary/Graft Acute MI Revascularization;  Surgeon: Burnell Blanks, MD;  Location: [place] CV LAB;  Service: Cardiovascular;  Laterality: N/A;   ENDARTERECTOMY Right 03/29/2020   Procedure: RIGHT CAROTID ARTERY ENDARTERECTOMY WITH PATCH ANGIOPLASTY;  Surgeon: Marty Heck, MD;  Location: [place];  Service: Vascular;  Laterality: Right;   ENDARTERECTOMY Left 09/11/2020   Procedure: ENDARTERECTOMY CAROTID LEFT;  Surgeon: Marty Heck, MD;  Location: [place];  Service: Vascular;  Laterality: Left;   LEFT HEART CATH AND CORONARY ANGIOGRAPHY N/A 04/07/2018   Procedure: LEFT HEART CATH AND CORONARY ANGIOGRAPHY;  Surgeon: Burnell Blanks, MD;  Location: [place] CV LAB;  Service: Cardiovascular;  Laterality: N/A;   MELANOMA EXCISION  07/2019   PATCH ANGIOPLASTY Left 09/11/2020   Procedure: PATCH ANGIOPLASTY;  Surgeon: Marty Heck, MD;  Location: [place];  Service:  Vascular;  Laterality: Left;   POLYPECTOMY  09/23/2016   Procedure: POLYPECTOMY;  Surgeon: Daneil Dolin, MD;  [facility];  Service: Endoscopy;;  colon   POLYPECTOMY  12/27/2019   Procedure: POLYPECTOMY;  Surgeon: Daneil Dolin, MD;  [facility];  Service: Endoscopy;;   RETINAL DETACHMENT SURGERY      Social History   Socioeconomic History   Marital status: Married    Spouse name: Not on file   Number of children: Not on file    Years of education: Not on file   Highest education level: Not on file  Occupational History   Not on file  Tobacco Use   Smoking status: Former    Packs/day: 1.50    Years: 30.00    Pack years: 45.00    Types: Cigarettes    Quit date: 07/05/2012    Years since quitting: 8.3   Smokeless tobacco: Never  Vaping Use   Vaping Use: Never used  Substance and Sexual Activity   Alcohol use: No   Drug use: No   Sexual activity: Not Currently    Birth control/protection: None  Other Topics Concern   Not on file  Social History Narrative   Not on file   Social Determinants of Health   Financial Resource Strain: Not on file  Food Insecurity: Not on file  Transportation Needs: Not on file  Physical Activity: Not on file  Stress: Not on file  Social Connections: Not on file  Intimate Partner Violence: Not on file    Current Outpatient Medications on File Prior to Visit  Medication Sig Dispense Refill   ALPRAZolam (XANAX) 0.5 MG tablet Take 0.5 mg by mouth 2 (two) times daily as needed for anxiety.     amLODipine (NORVASC) 10 MG tablet Take 1 tablet (10 mg total) by mouth daily. 30 tablet 1   apixaban (ELIQUIS) 5 MG TABS tablet Take 1 tablet (5 mg total) by mouth 2 (two) times daily. Restart medication tomorrow, 09/12/2020 60 tablet 6   atorvastatin (LIPITOR) 80 MG tablet TAKE ONE TABLET BY MOUTH EVERYDAY AT BEDTIME (Patient taking differently: Take 80 mg by mouth at bedtime.) 90 tablet 2   B Complex-C (SUPER B COMPLEX PO) Take 1 tablet by mouth daily.      carvedilol (COREG) 12.5 MG tablet TAKE ONE TABLET BY MOUTH TWICE DAILY (Patient taking differently: Take 12.5 mg by mouth 2 (two) times daily with a meal.) 180 tablet 1   DULoxetine (CYMBALTA) 60 MG capsule Take 60 mg by mouth daily with lunch.      ergocalciferol (VITAMIN D2) 50000 units capsule Take 50,000 Units by mouth every Sunday.      ezetimibe (ZETIA) 10 MG tablet Take 1 tablet (10 mg total) by mouth daily. 30 tablet 5    gabapentin (NEURONTIN) 300 MG capsule Take 300 mg by mouth 2 (two) times daily.      glimepiride (AMARYL) 4 MG tablet Take 4 mg by mouth daily.     glucose blood (ONETOUCH ULTRA) test strip USE ONE TEST STRIP AS DIRECTED TWICE DAILY 100 each 0   mupirocin ointment (BACTROBAN) 2 % Apply 1 application topically daily as needed (melonoma).     nitroGLYCERIN (NITROSTAT) 0.4 MG SL tablet Place 1 tablet (0.4 mg total) under the tongue every 5 (five) minutes as needed. (Patient taking differently: Place 0.4 mg under the tongue every 5 (five) minutes as needed for chest pain.) 25 tablet 2   oxyCODONE-acetaminophen (PERCOCET/ROXICET)  5-325 MG tablet Take 1-2 tablets by mouth every 4 (four) hours as needed for moderate pain. 10 tablet 0   rOPINIRole (REQUIP) 1 MG tablet Take 1 mg by mouth 4 (four) times daily.      timolol (TIMOPTIC) 0.5 % ophthalmic solution INSTILL 1 DROP INTO EACH EYE TWICE DAILY (Patient taking differently: Place 1 drop into both eyes 2 (two) times daily.) 10 mL 10   UNIFINE PENTIPS 31G X 5 MM MISC USE TO INJECT INSULIN AT BEDTIME (Patient taking differently: at bedtime.) 100 each 5   hydrALAZINE (APRESOLINE) 50 MG tablet Take 1 tablet (50 mg total) by mouth 3 (three) times daily. 270 tablet 3   No current facility-administered medications on file prior to visit.    No Known Allergies  Family History  Problem Relation Age of Onset   Hypertension Father    Colon cancer Neg Hx    Diabetes Neg Hx     BP 130/60 (BP Location: Right Arm, Patient Position: Sitting, Cuff Size: Large)   Pulse 63   Ht 5\' 11"  (1.803 m)   Wt 223 lb (101.2 kg)   SpO2 99%   BMI 31.10 kg/m    Review of Systems He denies hypoglycemia.      Objective:   Physical Exam    Lab Results  Component Value Date   HGBA1C 9.6 (A) 11/17/2020   Lab Results  Component Value Date   CREATININE 1.23 09/12/2020   BUN 19 09/12/2020   NA 134 (L) 09/12/2020   K 4.0 09/12/2020   CL 105 09/12/2020   CO2 23  09/12/2020       Assessment & Plan:  Insulin-requiring type 2 DM: uncontrolled Occupational status: we discussed.  I respect request for generic meds, but NPH is not meeting his needs.   Patient Instructions  check your blood sugar twice a day.  vary the time of day when you check, between before the 3 meals, and at bedtime.  also check if you have symptoms of your blood sugar being too high or too low.  please keep a record of the readings and bring it to your next appointment here (or you can bring the meter itself).  You can write it on any piece of paper.  please call us sooner if your blood sugar goes below 70, or if most of your readings are over 200.    We will need to take this complex situation in stages  Please continue the same glimepiride for now, and: Reduce the NPH insulin to 30 units each morning, and: I have sent a prescription to your pharmacy, to add "Trulicity," and: Please come back for a follow-up appointment in 1 month.

## 2020-11-17 NOTE — Patient Instructions (Addendum)
check your blood sugar twice a day.  vary the time of day when you check, between before the 3 meals, and at bedtime.  also check if you have symptoms of your blood sugar being too high or too low.  please keep a record of the readings and bring it to your next appointment here (or you can bring the meter itself).  You can write it on any piece of paper.  please call us sooner if your blood sugar goes below 70, or if most of your readings are over 200.    We will need to take this complex situation in stages  Please continue the same glimepiride for now, and: Reduce the NPH insulin to 30 units each morning, and: I have sent a prescription to your pharmacy, to add "Trulicity," and: Please come back for a follow-up appointment in 1 month.

## 2020-11-21 ENCOUNTER — Telehealth: Payer: Self-pay | Admitting: Endocrinology

## 2020-11-21 ENCOUNTER — Other Ambulatory Visit: Payer: Self-pay | Admitting: Endocrinology

## 2020-11-21 MED ORDER — TIRZEPATIDE 2.5 MG/0.5ML ~~LOC~~ SOAJ: 2.5000 mg | SUBCUTANEOUS | 3 refills | Status: DC

## 2020-11-21 NOTE — Telephone Encounter (Signed)
Per ins, Trulicity is more expensive. Xavier Caldwell is more affordable for the pt. Pt would like to start that medication. [facility] is preference. Pt contact [phone]

## 2020-11-21 NOTE — Telephone Encounter (Signed)
Message sent thru MyChart 

## 2020-11-22 ENCOUNTER — Other Ambulatory Visit: Payer: Self-pay | Admitting: Endocrinology

## 2020-11-22 ENCOUNTER — Telehealth: Payer: Self-pay | Admitting: Endocrinology

## 2020-11-22 MED ORDER — HUMULIN N KWIKPEN 100 UNIT/ML ~~LOC~~ SUPN: 40.0000 [IU] | PEN_INJECTOR | SUBCUTANEOUS | 3 refills | Status: DC

## 2020-11-22 MED ORDER — HUMULIN N KWIKPEN 100 UNIT/ML ~~LOC~~ SUPN: 30.0000 [IU] | PEN_INJECTOR | SUBCUTANEOUS | 3 refills | Status: DC

## 2020-11-22 NOTE — Telephone Encounter (Signed)
Pt has Novolin N vial prescription, but needs switched to [employer]. Also, has Engineer, agricultural prescription and pharmacy would like to know which medication he should be. History of Basaglar. Please clarify with pharm [phone]

## 2020-11-22 NOTE — Telephone Encounter (Signed)
[facility] called to get clarification for the Insulin NPH - they have received 3 RX for Insulin NPH all of which of seem to contradict each other - Patrina with [facility] requests a call to [phone] with verbal order so that they can get the medicine sent to PT

## 2020-11-22 NOTE — Telephone Encounter (Signed)
Spoke with Xavier Caldwell with [facility] to give clarification on the insulin NPH. Which according to [place] he wanted to cancel the 40u and wants pt on 30u.Marland Kitchen

## 2020-11-23 ENCOUNTER — Telehealth: Payer: Self-pay | Admitting: Pharmacy Technician

## 2020-11-23 ENCOUNTER — Other Ambulatory Visit (HOSPITAL_COMMUNITY): Payer: Self-pay

## 2020-11-23 NOTE — Telephone Encounter (Signed)
Patient Advocate Encounter   Received notification from CoverMyMeds that prior authorization for [facility] 2.5mg  is required.   PA submitted on 11/23/20 [place]  [id number] Status is pending    [facility] will continue to follow:   Armanda Magic, CPhT Patient [facility] Phone: [phone] Fax:  [phone]

## 2020-11-24 NOTE — Telephone Encounter (Signed)
Message sent thru MyChart 

## 2020-11-27 ENCOUNTER — Other Ambulatory Visit (HOSPITAL_COMMUNITY): Payer: Self-pay

## 2020-11-27 NOTE — Telephone Encounter (Signed)
[facility] Patient Advocate Encounter  Prior Authorization for [facility] 2.5mg  has been approved.    PA# PA Case ID: [id number] Effective dates: 02/05/20 through 02/03/22  Patients co-pay is $263.15. Pt is in the coverage gap.   Armanda Magic, CPhT Patient [facility] Phone: [phone] Fax:  [phone]

## 2020-11-28 ENCOUNTER — Other Ambulatory Visit: Payer: Self-pay | Admitting: Cardiology

## 2020-12-04 DIAGNOSIS — I130 Hypertensive heart and chronic kidney disease with heart failure and stage 1 through stage 4 chronic kidney disease, or unspecified chronic kidney disease: Secondary | ICD-10-CM | POA: Diagnosis not present

## 2020-12-04 DIAGNOSIS — E1122 Type 2 diabetes mellitus with diabetic chronic kidney disease: Secondary | ICD-10-CM | POA: Diagnosis not present

## 2020-12-04 DIAGNOSIS — E782 Mixed hyperlipidemia: Secondary | ICD-10-CM | POA: Diagnosis not present

## 2020-12-04 DIAGNOSIS — I5023 Acute on chronic systolic (congestive) heart failure: Secondary | ICD-10-CM | POA: Diagnosis not present

## 2020-12-14 ENCOUNTER — Encounter (INDEPENDENT_AMBULATORY_CARE_PROVIDER_SITE_OTHER): Payer: Medicare HMO | Admitting: Ophthalmology

## 2020-12-20 ENCOUNTER — Other Ambulatory Visit: Payer: Self-pay

## 2020-12-20 ENCOUNTER — Ambulatory Visit: Payer: Medicare HMO | Admitting: Endocrinology

## 2020-12-20 VITALS — BP 110/60 | HR 58 | Ht 71.0 in | Wt 228.4 lb

## 2020-12-20 DIAGNOSIS — E1122 Type 2 diabetes mellitus with diabetic chronic kidney disease: Secondary | ICD-10-CM

## 2020-12-20 DIAGNOSIS — Z794 Long term (current) use of insulin: Secondary | ICD-10-CM

## 2020-12-20 DIAGNOSIS — N1831 Chronic kidney disease, stage 3a: Secondary | ICD-10-CM

## 2020-12-20 MED ORDER — VICTOZA 18 MG/3ML ~~LOC~~ SOPN: 0.6000 mg | PEN_INJECTOR | Freq: Every day | SUBCUTANEOUS | 3 refills | Status: DC

## 2020-12-20 NOTE — Progress Notes (Signed)
Subjective:    Patient ID: Xavier Caldwell, male    DOB: Oct 17, 1950, 70 y.o.   MRN: [id number]  HPI Pt returns for f/u of diabetes mellitus: DM type: Insulin-requiring type 2 Dx'ed: 9563 Complications: CAD, CRI, PAD, and DR Therapy: insulin since 2013, and [place].   DKA: never Severe hypoglycemia: never Pancreatitis: never Pancreatic imaging: none known SDOH: care is limited by cost of meds; He works variable shifts. Other: he declines multiple daily injections. Interval history:   He brings a record of his cbg's which I have reviewed today.  CBG varies from 65-201.  There is no trend throughout the day.  He takes NPH, 40 units qam.  He did not start the [facility], due to cost.    Past Surgical History:  Procedure Laterality Date   APPENDECTOMY     CARDIOVERSION N/A 04/13/2018   Procedure: CARDIOVERSION;  Surgeon: Skeet Latch, MD;  Location: [facility];  Service: Cardiovascular;  Laterality: N/A;   CATARACT EXTRACTION W/PHACO Right 04/25/2014   Procedure: CATARACT EXTRACTION PHACO AND INTRAOCULAR LENS PLACEMENT ([place]);  Surgeon: Williams Che, MD;  [facility];  Service: Ophthalmology;  Laterality: Right;  CDE:4.70   CATARACT EXTRACTION W/PHACO Left 07/11/2014   Procedure: CATARACT EXTRACTION PHACO AND INTRAOCULAR LENS PLACEMENT LEFT EYE CDE=8.32;  Surgeon: Williams Che, MD;  [facility];  Service: Ophthalmology;  Laterality: Left;   COLONOSCOPY WITH PROPOFOL N/A 09/23/2016   Rourk: 9 polyps removed, multiple tubular adenomas. recommended 3 yr surveillance   COLONOSCOPY WITH PROPOFOL N/A 12/27/2019   ten sessile polyps, 4-9 mm in size in descending, hepatic flexure, and cecum, 11 mm polyp in sigmoid, three sessile polyps in rectum and mid rectum 2-3 mm in size. Tubular adenomas and hyperplastic. 3 year surveillance   CORONARY/GRAFT ACUTE MI REVASCULARIZATION N/A 04/07/2018   Procedure: Coronary/Graft Acute MI Revascularization;  Surgeon: Burnell Blanks, MD;   Location: [place] CV LAB;  Service: Cardiovascular;  Laterality: N/A;   ENDARTERECTOMY Right 03/29/2020   Procedure: RIGHT CAROTID ARTERY ENDARTERECTOMY WITH PATCH ANGIOPLASTY;  Surgeon: Marty Heck, MD;  Location: [place];  Service: Vascular;  Laterality: Right;   ENDARTERECTOMY Left 09/11/2020   Procedure: ENDARTERECTOMY CAROTID LEFT;  Surgeon: Marty Heck, MD;  Location: [place];  Service: Vascular;  Laterality: Left;   LEFT HEART CATH AND CORONARY ANGIOGRAPHY N/A 04/07/2018   Procedure: LEFT HEART CATH AND CORONARY ANGIOGRAPHY;  Surgeon: Burnell Blanks, MD;  Location: [place] [facility];  Service: Cardiovascular;  Laterality: N/A;   MELANOMA EXCISION  07/2019   PATCH ANGIOPLASTY Left 09/11/2020   Procedure: PATCH ANGIOPLASTY;  Surgeon: Marty Heck, MD;  Location[facility] OR;  Service: Vascular;  Laterality: Left;   POLYPECTOMY  09/23/2016   Procedure: POLYPECTOMY;  Surgeon: Daneil Dolin, MD;  [facility];  Service: Endoscopy;;  colon   POLYPECTOMY  12/27/2019   Procedure: POLYPECTOMY;  Surgeon: Daneil Dolin, MD;  [facility];  Service: Endoscopy;;   RETINAL DETACHMENT SURGERY      Social History   Socioeconomic History   Marital status: Married    Spouse name: Not on file   Number of children: Not on file   Years of education: Not on file   Highest education level: Not on file  Occupational History   Not on file  Tobacco Use   Smoking status: Former    Packs/day: 1.50    Years: 30.00    Pack years: 45.00  Types: Cigarettes    Quit date: 07/05/2012    Years since quitting: 8.4   Smokeless tobacco: Never  Vaping Use   Vaping Use: Never used  Substance and Sexual Activity   Alcohol use: No   Drug use: No   Sexual activity: Not Currently    Birth control/protection: None  Other Topics Concern   Not on file  Social History Narrative   Not on file   Social Determinants of Health   Financial Resource Strain: Not on  file  Food Insecurity: Not on file  Transportation Needs: Not on file  Physical Activity: Not on file  Stress: Not on file  Social Connections: Not on file  Intimate Partner Violence: Not on file    Current Outpatient Medications on File Prior to Visit  Medication Sig Dispense Refill   ALPRAZolam (XANAX) 0.5 MG tablet Take 0.5 mg by mouth 2 (two) times daily as needed for anxiety.     amLODipine (NORVASC) 10 MG tablet Take 1 tablet (10 mg total) by mouth daily. 30 tablet 1   apixaban (ELIQUIS) 5 MG TABS tablet Take 1 tablet (5 mg total) by mouth 2 (two) times daily. Restart medication tomorrow, 09/12/2020 60 tablet 6   atorvastatin (LIPITOR) 80 MG tablet TAKE ONE TABLET BY MOUTH EVERYDAY AT BEDTIME (Patient taking differently: Take 80 mg by mouth at bedtime.) 90 tablet 2   B Complex-C (SUPER B COMPLEX PO) Take 1 tablet by mouth daily.      carvedilol (COREG) 12.5 MG tablet TAKE ONE TABLET BY MOUTH TWICE DAILY 180 tablet 1   DULoxetine (CYMBALTA) 60 MG capsule Take 60 mg by mouth daily with lunch.      ergocalciferol (VITAMIN D2) 50000 units capsule Take 50,000 Units by mouth every Sunday.      ezetimibe (ZETIA) 10 MG tablet Take 1 tablet (10 mg total) by mouth daily. 30 tablet 5   gabapentin (NEURONTIN) 300 MG capsule Take 300 mg by mouth 2 (two) times daily.      glimepiride (AMARYL) 4 MG tablet Take 4 mg by mouth daily.     glucose blood (ONETOUCH ULTRA) test strip USE ONE TEST STRIP AS DIRECTED TWICE DAILY 100 each 0   Insulin NPH, Human,, Isophane, (HUMULIN N KWIKPEN) 100 UNIT/ML Kiwkpen Inject 30 Units into the skin every morning. 45 mL 3   mupirocin ointment (BACTROBAN) 2 % Apply 1 application topically daily as needed (melonoma).     nitroGLYCERIN (NITROSTAT) 0.4 MG SL tablet Place 1 tablet (0.4 mg total) under the tongue every 5 (five) minutes as needed. (Patient taking differently: Place 0.4 mg under the tongue every 5 (five) minutes as needed for chest pain.) 25 tablet 2    oxyCODONE-acetaminophen (PERCOCET/ROXICET) 5-325 MG tablet Take 1-2 tablets by mouth every 4 (four) hours as needed for moderate pain. 10 tablet 0   rOPINIRole (REQUIP) 1 MG tablet Take 1 mg by mouth 4 (four) times daily.      timolol (TIMOPTIC) 0.5 % ophthalmic solution INSTILL 1 DROP INTO EACH EYE TWICE DAILY (Patient taking differently: Place 1 drop into both eyes 2 (two) times daily.) 10 mL 10   UNIFINE PENTIPS 31G X 5 MM MISC USE TO INJECT INSULIN AT BEDTIME (Patient taking differently: at bedtime.) 100 each 5   hydrALAZINE (APRESOLINE) 50 MG tablet Take 1 tablet (50 mg total) by mouth 3 (three) times daily. 270 tablet 3   No current facility-administered medications on file prior to visit.    No Known  Allergies  Family History  Problem Relation Age of Onset   Hypertension Father    Colon cancer Neg Hx    Diabetes Neg Hx     BP 110/60 (BP Location: Right Arm, Patient Position: Sitting, Cuff Size: Large)   Pulse (!) 58   Ht 5\' 11"  (1.803 m)   Wt 228 lb 6.4 oz (103.6 kg)   SpO2 98%   BMI 31.86 kg/m   Review of Systems     Objective:   Physical Exam   Lab Results  Component Value Date   HGBA1C 9.6 (A) 11/17/2020       Assessment & Plan:  Insulin-requiring type 2 DM: uncontrolled Hypoglycemia, due to insulin.  Patient Instructions  check your blood sugar twice a day.  vary the time of day when you check, between before the 3 meals, and at bedtime.  also check if you have symptoms of your blood sugar being too high or too low.  please keep a record of the readings and bring it to your next appointment here (or you can bring the meter itself).  You can write it on any piece of paper.  please call us sooner if your blood sugar goes below 70, or if most of your readings are over 200.    We will need to take this complex situation in stages.  Please continue the same glimepiride for now, and:  Reduce the NPH insulin to 30 units each morning, and:  I have sent a  prescription to your pharmacy, to add "Victoza," and:  Please come back for a follow-up appointment in January.

## 2020-12-20 NOTE — Patient Instructions (Addendum)
check your blood sugar twice a day.  vary the time of day when you check, between before the 3 meals, and at bedtime.  also check if you have symptoms of your blood sugar being too high or too low.  please keep a record of the readings and bring it to your next appointment here (or you can bring the meter itself).  You can write it on any piece of paper.  please call us sooner if your blood sugar goes below 70, or if most of your readings are over 200.    We will need to take this complex situation in stages.  Please continue the same glimepiride for now, and:  Reduce the NPH insulin to 30 units each morning, and:  I have sent a prescription to your pharmacy, to add "Victoza," and:  Please come back for a follow-up appointment in January.

## 2021-01-03 ENCOUNTER — Other Ambulatory Visit: Payer: Self-pay

## 2021-01-03 ENCOUNTER — Encounter (INDEPENDENT_AMBULATORY_CARE_PROVIDER_SITE_OTHER): Payer: Self-pay | Admitting: Ophthalmology

## 2021-01-03 ENCOUNTER — Ambulatory Visit (INDEPENDENT_AMBULATORY_CARE_PROVIDER_SITE_OTHER): Payer: Medicare HMO | Admitting: Ophthalmology

## 2021-01-03 ENCOUNTER — Encounter (INDEPENDENT_AMBULATORY_CARE_PROVIDER_SITE_OTHER): Payer: Medicare HMO | Admitting: Ophthalmology

## 2021-01-03 DIAGNOSIS — E113553 Type 2 diabetes mellitus with stable proliferative diabetic retinopathy, bilateral: Secondary | ICD-10-CM | POA: Diagnosis not present

## 2021-01-03 DIAGNOSIS — I130 Hypertensive heart and chronic kidney disease with heart failure and stage 1 through stage 4 chronic kidney disease, or unspecified chronic kidney disease: Secondary | ICD-10-CM | POA: Diagnosis not present

## 2021-01-03 DIAGNOSIS — I5023 Acute on chronic systolic (congestive) heart failure: Secondary | ICD-10-CM | POA: Diagnosis not present

## 2021-01-03 DIAGNOSIS — Z794 Long term (current) use of insulin: Secondary | ICD-10-CM

## 2021-01-03 DIAGNOSIS — Z8669 Personal history of other diseases of the nervous system and sense organs: Secondary | ICD-10-CM | POA: Diagnosis not present

## 2021-01-03 DIAGNOSIS — E1122 Type 2 diabetes mellitus with diabetic chronic kidney disease: Secondary | ICD-10-CM | POA: Diagnosis not present

## 2021-01-03 DIAGNOSIS — E782 Mixed hyperlipidemia: Secondary | ICD-10-CM | POA: Diagnosis not present

## 2021-01-03 NOTE — Assessment & Plan Note (Signed)
Each eye is stable with clear vitreous, good retinopexy no progression of retinopathy

## 2021-01-03 NOTE — Progress Notes (Signed)
01/03/2021     CHIEF COMPLAINT Patient presents for  Chief Complaint  Patient presents with   Retina Follow Up      HISTORY OF PRESENT ILLNESS: Xavier ARTH is a 70 y.o. male who presents to the clinic today for:   HPI     Retina Follow Up   Patient presents with  Diabetic Retinopathy.  In both eyes.  This started 9 months ago.  Severity is mild.  Duration of 9 months.  Since onset it is gradually improving.        Comments   9 month fu ou and oct and fp Pt states VA OU stable since last visit. Pt denies FOL, floaters, or ocular pain OU.  Pt states, "I feel like my vision is better than it has been. They are working on my blood sugar and changing my medicines. My A1C was up but now that they medicine is working better my sugars are coming down." A1C: 8.8 LBS: 130 Pt reports Timolol BID OU       Last edited by Kendra Opitz, COA on 01/03/2021 11:15 AM.      Referring physician: Redmond School, MD [address] [place],  [place] [address]  HISTORICAL INFORMATION:   Selected notes from the MEDICAL RECORD NUMBER    Lab Results  Component Value Date   HGBA1C 9.6 (A) 11/17/2020     CURRENT MEDICATIONS: Current Outpatient Medications (Ophthalmic Drugs)  Medication Sig   timolol (TIMOPTIC) 0.5 % ophthalmic solution INSTILL 1 DROP INTO EACH EYE TWICE DAILY (Patient taking differently: Place 1 drop into both eyes 2 (two) times daily.)   No current facility-administered medications for this visit. (Ophthalmic Drugs)   Current Outpatient Medications (Other)  Medication Sig   ALPRAZolam (XANAX) 0.5 MG tablet Take 0.5 mg by mouth 2 (two) times daily as needed for anxiety.   amLODipine (NORVASC) 10 MG tablet Take 1 tablet (10 mg total) by mouth daily.   apixaban (ELIQUIS) 5 MG TABS tablet Take 1 tablet (5 mg total) by mouth 2 (two) times daily. Restart medication tomorrow, 09/12/2020   atorvastatin (LIPITOR) 80 MG tablet TAKE ONE TABLET BY MOUTH EVERYDAY AT  BEDTIME (Patient taking differently: Take 80 mg by mouth at bedtime.)   B Complex-C (SUPER B COMPLEX PO) Take 1 tablet by mouth daily.    carvedilol (COREG) 12.5 MG tablet TAKE ONE TABLET BY MOUTH TWICE DAILY   DULoxetine (CYMBALTA) 60 MG capsule Take 60 mg by mouth daily with lunch.    ergocalciferol (VITAMIN D2) 50000 units capsule Take 50,000 Units by mouth every Sunday.    ezetimibe (ZETIA) 10 MG tablet Take 1 tablet (10 mg total) by mouth daily.   gabapentin (NEURONTIN) 300 MG capsule Take 300 mg by mouth 2 (two) times daily.    glimepiride (AMARYL) 4 MG tablet Take 4 mg by mouth daily.   glucose blood (ONETOUCH ULTRA) test strip USE ONE TEST STRIP AS DIRECTED TWICE DAILY   hydrALAZINE (APRESOLINE) 50 MG tablet Take 1 tablet (50 mg total) by mouth 3 (three) times daily.   Insulin NPH, Human,, Isophane, (HUMULIN N KWIKPEN) 100 UNIT/ML Kiwkpen Inject 30 Units into the skin every morning.   liraglutide (VICTOZA) 18 MG/3ML SOPN Inject 0.6 mg into the skin daily.   mupirocin ointment (BACTROBAN) 2 % Apply 1 application topically daily as needed (melonoma).   nitroGLYCERIN (NITROSTAT) 0.4 MG SL tablet Place 1 tablet (0.4 mg total) under the tongue every 5 (five) minutes as  needed. (Patient taking differently: Place 0.4 mg under the tongue every 5 (five) minutes as needed for chest pain.)   oxyCODONE-acetaminophen (PERCOCET/ROXICET) 5-325 MG tablet Take 1-2 tablets by mouth every 4 (four) hours as needed for moderate pain.   rOPINIRole (REQUIP) 1 MG tablet Take 1 mg by mouth 4 (four) times daily.    UNIFINE PENTIPS 31G X 5 MM MISC USE TO INJECT INSULIN AT BEDTIME (Patient taking differently: at bedtime.)   No current facility-administered medications for this visit. (Other)      REVIEW OF SYSTEMS:    ALLERGIES No Known Allergies  PAST MEDICAL HISTORY Past Medical History:  Diagnosis Date   Acute systolic (congestive) heart failure (HCC)    Anxiety    Arthritis    Atrial fibrillation  (HCC)    Back pain    Chronic kidney disease    pt told by PA of Dr. [name] that he had "stage 3 kidney so they had me stop taking Metformin"   Coronary artery disease    Diabetes mellitus without complication (HCC)    High cholesterol    History of gout    Hypertension    Myocardial infarction ([place]) 04/2018   Neuropathy    PAD (peripheral artery disease) (HCC)    Retinal detachment    One on the right and two on the left   Sleep apnea    Stop Bang score of 4   STEMI (ST elevation myocardial infarction) ([place])    04/08/2018 PCI/DES RCA   Stroke [facility])    February 2022   VT (ventricular tachycardia)    Past Surgical History:  Procedure Laterality Date   APPENDECTOMY     CARDIOVERSION N/A 04/13/2018   Procedure: CARDIOVERSION;  Surgeon: Skeet Latch, MD;  Location: [place];  Service: Cardiovascular;  Laterality: N/A;   CATARACT EXTRACTION W/PHACO Right 04/25/2014   Procedure: CATARACT EXTRACTION PHACO AND INTRAOCULAR LENS PLACEMENT ([place]);  Surgeon: Williams Che, MD;  [facility];  Service: Ophthalmology;  Laterality: Right;  CDE:4.70   CATARACT EXTRACTION W/PHACO Left 07/11/2014   Procedure: CATARACT EXTRACTION PHACO AND INTRAOCULAR LENS PLACEMENT LEFT EYE CDE=8.32;  Surgeon: Williams Che, MD;  Location: [facility];  Service: Ophthalmology;  Laterality: Left;   COLONOSCOPY WITH PROPOFOL N/A 09/23/2016   Rourk: 9 polyps removed, multiple tubular adenomas. recommended 3 yr surveillance   COLONOSCOPY WITH PROPOFOL N/A 12/27/2019   ten sessile polyps, 4-9 mm in size in descending, hepatic flexure, and cecum, 11 mm polyp in sigmoid, three sessile polyps in rectum and mid rectum 2-3 mm in size. Tubular adenomas and hyperplastic. 3 year surveillance   CORONARY/GRAFT ACUTE MI REVASCULARIZATION N/A 04/07/2018   Procedure: Coronary/Graft Acute MI Revascularization;  Surgeon: Burnell Blanks, MD;  Location: [place] [facility];  Service: Cardiovascular;  Laterality: N/A;    ENDARTERECTOMY Right 03/29/2020   Procedure: RIGHT CAROTID ARTERY ENDARTERECTOMY WITH PATCH ANGIOPLASTY;  Surgeon: Marty Heck, MD;  Location: [place];  Service: Vascular;  Laterality: Right;   ENDARTERECTOMY Left 09/11/2020   Procedure: ENDARTERECTOMY CAROTID LEFT;  Surgeon: Marty Heck, MD;  Location: [place];  Service: Vascular;  Laterality: Left;   LEFT HEART CATH AND CORONARY ANGIOGRAPHY N/A 04/07/2018   Procedure: LEFT HEART CATH AND CORONARY ANGIOGRAPHY;  Surgeon: Burnell Blanks, MD;  Location: [place] [facility];  Service: Cardiovascular;  Laterality: N/A;   MELANOMA EXCISION  07/2019   PATCH ANGIOPLASTY Left 09/11/2020   Procedure: PATCH ANGIOPLASTY;  Surgeon: Marty Heck,  MD;  [facility];  Service: Vascular;  Laterality: Left;   POLYPECTOMY  09/23/2016   Procedure: POLYPECTOMY;  Surgeon: Daneil Dolin, MD;  [facility];  Service: Endoscopy;;  colon   POLYPECTOMY  12/27/2019   Procedure: POLYPECTOMY;  Surgeon: Daneil Dolin, MD;  [facility];  Service: Endoscopy;;   RETINAL DETACHMENT SURGERY      FAMILY HISTORY Family History  Problem Relation Age of Onset   Hypertension Father    Colon cancer Neg Hx    Diabetes Neg Hx     SOCIAL HISTORY Social History   Tobacco Use   Smoking status: Former    Packs/day: 1.50    Years: 30.00    Pack years: 45.00    Types: Cigarettes    Quit date: 07/05/2012    Years since quitting: 8.5   Smokeless tobacco: Never  Vaping Use   Vaping Use: Never used  Substance Use Topics   Alcohol use: No   Drug use: No         OPHTHALMIC EXAM:  Base Eye Exam     Visual Acuity (ETDRS)       Right Left   Dist cc 20/40 20/40   Dist ph cc 20/30 -1 NI         Tonometry (Tonopen, 11:18 AM)       Right Left   Pressure 10 12         Pupils       Pupils Shape React APD   Right PERRL Round Brisk None   Left PERRL Round Brisk None         Visual Fields (Counting fingers)        Left Right    Full Full         Extraocular Movement       Right Left    Full Full         Neuro/Psych     Oriented x3: Yes   Mood/Affect: Normal         Dilation     Both eyes: 1.0% Mydriacyl, 2.5% Phenylephrine @ 11:18 AM           Slit Lamp and Fundus Exam     External Exam       Right Left   External Normal Normal         Slit Lamp Exam       Right Left   Lids/Lashes Normal Normal   Conjunctiva/Sclera White and quiet White and quiet   Cornea Clear Clear   Anterior Chamber Deep and quiet Deep and quiet   Iris Round and reactive Round and reactive   Lens Posterior chamber intraocular lens Posterior chamber intraocular lens   Anterior Vitreous Normal Normal         Fundus Exam       Right Left   Posterior Vitreous Vitrectomized Posterior vitreous detachment   Disc Normal Normal   C/D Ratio 0.7 0.75   Macula no macular thickening, Microaneurysms, no clinically significant macular edema, Retinal pigment epithelial mottling no macular thickening, Microaneurysms, no clinically significant macular edema, Retinal pigment epithelial mottling   Vessels Proliferative diabetic retinopathy quiescent Proliferative diabetic retinopathy quiescent   Periphery Good PRP, a attached 360 Good PRP, attached 360, watch temporally            IMAGING AND PROCEDURES  Imaging and Procedures for 01/03/21  OCT, Retina - OU - Both Eyes  Right Eye Quality was good. Scan locations included subfoveal. Central Foveal Thickness: 329. Progression has been stable. Findings include abnormal foveal contour.   Left Eye Quality was good. Scan locations included subfoveal. Central Foveal Thickness: 290. Progression has been stable. Findings include normal foveal contour.   Notes No active maculopathy in either eye.     Color Fundus Photography Optos - OU - Both Eyes       Right Eye Progression has been stable. Macula : microaneurysms.   Left  Eye Progression has been stable. Disc findings include normal observations. Macula : microaneurysms.   Notes OU, no active maculopathy.  Quiescent proliferative diabetic retinopathy with good peripheral PRP               ASSESSMENT/PLAN:  History of retinal detachment Each eye is stable with clear vitreous, good retinopexy no progression of retinopathy     ICD-10-CM   1. Controlled type 2 diabetes mellitus with stable proliferative retinopathy of both eyes, with long-term current use of insulin (HCC)  E11.3553 OCT, Retina - OU - Both Eyes   Z79.4 Color Fundus Photography Optos - OU - Both Eyes    2. History of retinal detachment  Z86.69       1.  OU, stable condition.  No recurrence of retinal detachment, ataxia looks great OU  2.  No significant retinopathy OU observe  3.  Ophthalmic Meds Ordered this visit:  No orders of the defined types were placed in this encounter.      Return in about 1 year (around 01/03/2022) for DILATE OU, COLOR FP, OCT.  There are no Patient Instructions on file for this visit.   Explained the diagnoses, plan, and follow up with the patient and they expressed understanding.  Patient expressed understanding of the importance of proper follow up care.   Clent Demark Chris Cripps M.D. Diseases & Surgery of the Retina and Vitreous Retina & Diabetic [place] 01/03/21     Abbreviations: M myopia (nearsighted); A astigmatism; H hyperopia (farsighted); P presbyopia; Mrx spectacle prescription;  CTL contact lenses; OD right eye; OS left eye; OU both eyes  XT exotropia; ET esotropia; PEK punctate epithelial keratitis; PEE punctate epithelial erosions; DES dry eye syndrome; MGD meibomian gland dysfunction; ATs artificial tears; PFAT's preservative free artificial tears; [place] nuclear sclerotic cataract; PSC posterior subcapsular cataract; ERM epi-retinal membrane; PVD posterior vitreous detachment; RD retinal detachment; DM diabetes mellitus; DR diabetic  retinopathy; NPDR non-proliferative diabetic retinopathy; PDR proliferative diabetic retinopathy; CSME clinically significant macular edema; DME diabetic macular edema; dbh dot blot hemorrhages; CWS cotton wool spot; POAG primary open angle glaucoma; C/D cup-to-disc ratio; HVF humphrey visual field; GVF goldmann visual field; OCT optical coherence tomography; IOP intraocular pressure; BRVO Branch retinal vein occlusion; CRVO central retinal vein occlusion; CRAO central retinal artery occlusion; BRAO branch retinal artery occlusion; RT retinal tear; SB scleral buckle; PPV pars plana vitrectomy; VH Vitreous hemorrhage; PRP panretinal laser photocoagulation; IVK intravitreal kenalog; VMT vitreomacular traction; MH Macular hole;  NVD neovascularization of the disc; NVE neovascularization elsewhere; AREDS age related eye disease study; ARMD age related macular degeneration; POAG primary open angle glaucoma; EBMD epithelial/anterior basement membrane dystrophy; ACIOL anterior chamber intraocular lens; IOL intraocular lens; PCIOL posterior chamber intraocular lens; Phaco/IOL phacoemulsification with intraocular lens placement; [place] photorefractive keratectomy; LASIK laser assisted in situ keratomileusis; HTN hypertension; DM diabetes mellitus; COPD chronic obstructive pulmonary disease

## 2021-01-18 DIAGNOSIS — G894 Chronic pain syndrome: Secondary | ICD-10-CM | POA: Diagnosis not present

## 2021-01-18 DIAGNOSIS — I10 Essential (primary) hypertension: Secondary | ICD-10-CM | POA: Diagnosis not present

## 2021-01-18 DIAGNOSIS — E1140 Type 2 diabetes mellitus with diabetic neuropathy, unspecified: Secondary | ICD-10-CM | POA: Diagnosis not present

## 2021-01-18 DIAGNOSIS — E782 Mixed hyperlipidemia: Secondary | ICD-10-CM | POA: Diagnosis not present

## 2021-02-06 DIAGNOSIS — D6869 Other thrombophilia: Secondary | ICD-10-CM | POA: Diagnosis not present

## 2021-02-06 DIAGNOSIS — F419 Anxiety disorder, unspecified: Secondary | ICD-10-CM | POA: Diagnosis not present

## 2021-02-06 DIAGNOSIS — E1142 Type 2 diabetes mellitus with diabetic polyneuropathy: Secondary | ICD-10-CM | POA: Diagnosis not present

## 2021-02-06 DIAGNOSIS — Z008 Encounter for other general examination: Secondary | ICD-10-CM | POA: Diagnosis not present

## 2021-02-06 DIAGNOSIS — E1143 Type 2 diabetes mellitus with diabetic autonomic (poly)neuropathy: Secondary | ICD-10-CM | POA: Diagnosis not present

## 2021-02-06 DIAGNOSIS — R69 Illness, unspecified: Secondary | ICD-10-CM | POA: Diagnosis not present

## 2021-02-06 DIAGNOSIS — E669 Obesity, unspecified: Secondary | ICD-10-CM | POA: Diagnosis not present

## 2021-02-06 DIAGNOSIS — G2581 Restless legs syndrome: Secondary | ICD-10-CM | POA: Diagnosis not present

## 2021-02-06 DIAGNOSIS — F324 Major depressive disorder, single episode, in partial remission: Secondary | ICD-10-CM | POA: Diagnosis not present

## 2021-02-06 DIAGNOSIS — E11620 Type 2 diabetes mellitus with diabetic dermatitis: Secondary | ICD-10-CM | POA: Diagnosis not present

## 2021-02-06 DIAGNOSIS — E1159 Type 2 diabetes mellitus with other circulatory complications: Secondary | ICD-10-CM | POA: Diagnosis not present

## 2021-02-06 DIAGNOSIS — E1151 Type 2 diabetes mellitus with diabetic peripheral angiopathy without gangrene: Secondary | ICD-10-CM | POA: Diagnosis not present

## 2021-02-06 DIAGNOSIS — E785 Hyperlipidemia, unspecified: Secondary | ICD-10-CM | POA: Diagnosis not present

## 2021-02-06 DIAGNOSIS — E1165 Type 2 diabetes mellitus with hyperglycemia: Secondary | ICD-10-CM | POA: Diagnosis not present

## 2021-02-09 DIAGNOSIS — Z6832 Body mass index (BMI) 32.0-32.9, adult: Secondary | ICD-10-CM | POA: Diagnosis not present

## 2021-02-09 DIAGNOSIS — R6889 Other general symptoms and signs: Secondary | ICD-10-CM | POA: Diagnosis not present

## 2021-02-09 DIAGNOSIS — I130 Hypertensive heart and chronic kidney disease with heart failure and stage 1 through stage 4 chronic kidney disease, or unspecified chronic kidney disease: Secondary | ICD-10-CM | POA: Diagnosis not present

## 2021-02-09 DIAGNOSIS — E1165 Type 2 diabetes mellitus with hyperglycemia: Secondary | ICD-10-CM | POA: Diagnosis not present

## 2021-02-09 DIAGNOSIS — M150 Primary generalized (osteo)arthritis: Secondary | ICD-10-CM | POA: Diagnosis not present

## 2021-02-09 DIAGNOSIS — E669 Obesity, unspecified: Secondary | ICD-10-CM | POA: Diagnosis not present

## 2021-02-12 ENCOUNTER — Ambulatory Visit: Payer: Medicare HMO | Admitting: Family Medicine

## 2021-02-14 ENCOUNTER — Ambulatory Visit: Payer: Medicare HMO | Admitting: Cardiology

## 2021-02-23 ENCOUNTER — Ambulatory Visit: Payer: Medicare HMO | Admitting: Endocrinology

## 2021-02-28 ENCOUNTER — Other Ambulatory Visit: Payer: Self-pay | Admitting: Physician Assistant

## 2021-03-02 ENCOUNTER — Other Ambulatory Visit: Payer: Self-pay | Admitting: Physician Assistant

## 2021-03-06 DIAGNOSIS — E1122 Type 2 diabetes mellitus with diabetic chronic kidney disease: Secondary | ICD-10-CM | POA: Diagnosis not present

## 2021-03-06 DIAGNOSIS — I5023 Acute on chronic systolic (congestive) heart failure: Secondary | ICD-10-CM | POA: Diagnosis not present

## 2021-03-06 DIAGNOSIS — I130 Hypertensive heart and chronic kidney disease with heart failure and stage 1 through stage 4 chronic kidney disease, or unspecified chronic kidney disease: Secondary | ICD-10-CM | POA: Diagnosis not present

## 2021-03-06 DIAGNOSIS — E782 Mixed hyperlipidemia: Secondary | ICD-10-CM | POA: Diagnosis not present

## 2021-03-09 ENCOUNTER — Other Ambulatory Visit: Payer: Self-pay | Admitting: Physician Assistant

## 2021-03-13 ENCOUNTER — Ambulatory Visit: Payer: Medicare HMO | Admitting: Endocrinology

## 2021-03-13 ENCOUNTER — Other Ambulatory Visit: Payer: Self-pay

## 2021-03-13 VITALS — BP 130/64 | HR 67 | Ht 71.0 in | Wt 232.8 lb

## 2021-03-13 DIAGNOSIS — N1831 Chronic kidney disease, stage 3a: Secondary | ICD-10-CM | POA: Diagnosis not present

## 2021-03-13 DIAGNOSIS — Z794 Long term (current) use of insulin: Secondary | ICD-10-CM

## 2021-03-13 DIAGNOSIS — E1122 Type 2 diabetes mellitus with diabetic chronic kidney disease: Secondary | ICD-10-CM

## 2021-03-13 LAB — POCT GLYCOSYLATED HEMOGLOBIN (HGB A1C): Hemoglobin A1C: 9.5 % — AB (ref 4.0–5.6)

## 2021-03-13 MED ORDER — NOVOLIN N FLEXPEN RELION 100 UNIT/ML ~~LOC~~ SUPN: 50.0000 [IU] | PEN_INJECTOR | SUBCUTANEOUS | 3 refills | Status: DC

## 2021-03-13 MED ORDER — INSULIN NPH (HUMAN) (ISOPHANE) 100 UNIT/ML ~~LOC~~ SUSP: 50.0000 [IU] | SUBCUTANEOUS | 11 refills | Status: DC

## 2021-03-13 NOTE — Progress Notes (Signed)
Subjective:    Patient ID: Xavier Caldwell, male    DOB: [birth year], 71 y.o.   MRN: [id number]  HPI Pt returns for f/u of diabetes mellitus: DM type: Insulin-requiring type 2 Dx'ed: 8250 Complications: CAD, CRI, PAD, and DR Therapy: insulin since 2013, and Mounjaro.   DKA: never Severe hypoglycemia: never Pancreatitis: never Pancreatic imaging: none known SDOH: care is limited by cost of meds; he could not afford Mounjaro.  He works variable shifts.   Other: he declines multiple daily injections.   Interval history:   He brings a record of his cbg's which I have reviewed today.  CBG varies from 60-216.  It is in general higher as the day goes on, but not necessarily so.  He takes NPH, 40 units QD or BID.  He has mild hypoglycemia approx 1/week.  This happens fasting.  He now works 2nd shift.  He also declined Victoza, due to cost.   Past Medical History:  Diagnosis Date   Acute systolic (congestive) heart failure (HCC)    Anxiety    Arthritis    Atrial fibrillation (HCC)    Back pain    Chronic kidney disease    pt told by PA of Dr. [name] that he had "stage 3 kidney so they had me stop taking Metformin"   Coronary artery disease    Diabetes mellitus without complication (HCC)    High cholesterol    History of gout    Hypertension    Myocardial infarction ([place]) 04/2018   Neuropathy    PAD (peripheral artery disease) (HCC)    Retinal detachment    One on the right and two on the left   Sleep apnea    Stop Bang score of 4   STEMI (ST elevation myocardial infarction) ([place])    04/08/2018 PCI/DES RCA   Stroke [facility])    February 2022   VT (ventricular tachycardia)     Past Surgical History:  Procedure Laterality Date   APPENDECTOMY     CARDIOVERSION N/A 04/13/2018   Procedure: CARDIOVERSION;  Surgeon: Skeet Latch, MD;  Location: [place];  Service: Cardiovascular;  Laterality: N/A;   CATARACT EXTRACTION W/PHACO Right 04/25/2014   Procedure: CATARACT EXTRACTION PHACO AND  INTRAOCULAR LENS PLACEMENT ([place]);  Surgeon: Williams Che, MD;  [facility];  Service: Ophthalmology;  Laterality: Right;  CDE:4.70   CATARACT EXTRACTION W/PHACO Left 07/11/2014   Procedure: CATARACT EXTRACTION PHACO AND INTRAOCULAR LENS PLACEMENT LEFT EYE CDE=8.32;  Surgeon: Williams Che, MD;  [facility];  Service: Ophthalmology;  Laterality: Left;   COLONOSCOPY WITH PROPOFOL N/A 09/23/2016   Rourk: 9 polyps removed, multiple tubular adenomas. recommended 3 yr surveillance   COLONOSCOPY WITH PROPOFOL N/A 12/27/2019   ten sessile polyps, 4-9 mm in size in descending, hepatic flexure, and cecum, 11 mm polyp in sigmoid, three sessile polyps in rectum and mid rectum 2-3 mm in size. Tubular adenomas and hyperplastic. 3 year surveillance   CORONARY/GRAFT ACUTE MI REVASCULARIZATION N/A 04/07/2018   Procedure: Coronary/Graft Acute MI Revascularization;  Surgeon: Burnell Blanks, MD;  Location: [place] [facility];  Service: Cardiovascular;  Laterality: N/A;   ENDARTERECTOMY Right 03/29/2020   Procedure: RIGHT CAROTID ARTERY ENDARTERECTOMY WITH PATCH ANGIOPLASTY;  Surgeon: Marty Heck, MD;  Location: [place];  Service: Vascular;  Laterality: Right;   ENDARTERECTOMY Left 09/11/2020   Procedure: ENDARTERECTOMY CAROTID LEFT;  Surgeon: Marty Heck, MD;  Location: [place];  Service: Vascular;  Laterality: Left;  LEFT HEART CATH AND CORONARY ANGIOGRAPHY N/A 04/07/2018   Procedure: LEFT HEART CATH AND CORONARY ANGIOGRAPHY;  Surgeon: Burnell Blanks, MD;  Location: [place] [facility];  Service: Cardiovascular;  Laterality: N/A;   MELANOMA EXCISION  07/2019   PATCH ANGIOPLASTY Left 09/11/2020   Procedure: PATCH ANGIOPLASTY;  Surgeon: Marty Heck, MD;  Location: [facility];  Service: Vascular;  Laterality: Left;   POLYPECTOMY  09/23/2016   Procedure: POLYPECTOMY;  Surgeon: Daneil Dolin, MD;  [facility];  Service: Endoscopy;;  colon   POLYPECTOMY  12/27/2019    Procedure: POLYPECTOMY;  Surgeon: Daneil Dolin, MD;  [facility];  Service: Endoscopy;;   RETINAL DETACHMENT SURGERY      Social History   Socioeconomic History   Marital status: Married    Spouse name: Not on file   Number of children: Not on file   Years of education: Not on file   Highest education level: Not on file  Occupational History   Not on file  Tobacco Use   Smoking status: Former    Packs/day: 1.50    Years: 30.00    Pack years: 45.00    Types: Cigarettes    Quit date: 07/05/2012    Years since quitting: 8.7   Smokeless tobacco: Never  Vaping Use   Vaping Use: Never used  Substance and Sexual Activity   Alcohol use: No   Drug use: No   Sexual activity: Not Currently    Birth control/protection: None  Other Topics Concern   Not on file  Social History Narrative   Not on file   Social Determinants of Health   Financial Resource Strain: Not on file  Food Insecurity: Not on file  Transportation Needs: Not on file  Physical Activity: Not on file  Stress: Not on file  Social Connections: Not on file  Intimate Partner Violence: Not on file    Current Outpatient Medications on File Prior to Visit  Medication Sig Dispense Refill   ALPRAZolam (XANAX) 0.5 MG tablet Take 0.5 mg by mouth 2 (two) times daily as needed for anxiety.     amLODipine (NORVASC) 10 MG tablet Take 1 tablet (10 mg total) by mouth daily. 30 tablet 1   apixaban (ELIQUIS) 5 MG TABS tablet Take 1 tablet (5 mg total) by mouth 2 (two) times daily. Restart medication tomorrow, 09/12/2020 60 tablet 6   atorvastatin (LIPITOR) 80 MG tablet TAKE ONE TABLET BY MOUTH EVERYDAY AT BEDTIME (Patient taking differently: Take 80 mg by mouth at bedtime.) 90 tablet 2   B Complex-C (SUPER B COMPLEX PO) Take 1 tablet by mouth daily.      carvedilol (COREG) 12.5 MG tablet TAKE ONE TABLET BY MOUTH TWICE DAILY 180 tablet 1   DULoxetine (CYMBALTA) 60 MG capsule Take 60 mg by mouth daily with lunch.       ergocalciferol (VITAMIN D2) 50000 units capsule Take 50,000 Units by mouth every Sunday.      gabapentin (NEURONTIN) 300 MG capsule Take 300 mg by mouth 2 (two) times daily.      glimepiride (AMARYL) 4 MG tablet Take 4 mg by mouth daily.     glucose blood (ONETOUCH ULTRA) test strip USE ONE TEST STRIP AS DIRECTED TWICE DAILY 100 each 0   mupirocin ointment (BACTROBAN) 2 % Apply 1 application topically daily as needed (melonoma).     nitroGLYCERIN (NITROSTAT) 0.4 MG SL tablet Place 1 tablet (0.4 mg total) under the tongue every  5 (five) minutes as needed. (Patient taking differently: Place 0.4 mg under the tongue every 5 (five) minutes as needed for chest pain.) 25 tablet 2   oxyCODONE-acetaminophen (PERCOCET/ROXICET) 5-325 MG tablet Take 1-2 tablets by mouth every 4 (four) hours as needed for moderate pain. 10 tablet 0   rOPINIRole (REQUIP) 1 MG tablet Take 1 mg by mouth 4 (four) times daily.      timolol (TIMOPTIC) 0.5 % ophthalmic solution INSTILL 1 DROP INTO EACH EYE TWICE DAILY (Patient taking differently: Place 1 drop into both eyes 2 (two) times daily.) 10 mL 10   UNIFINE PENTIPS 31G X 5 MM MISC USE TO INJECT INSULIN AT BEDTIME (Patient taking differently: at bedtime.) 100 each 5   ezetimibe (ZETIA) 10 MG tablet Take 1 tablet (10 mg total) by mouth daily. 30 tablet 5   hydrALAZINE (APRESOLINE) 50 MG tablet Take 1 tablet (50 mg total) by mouth 3 (three) times daily. 270 tablet 3   No current facility-administered medications on file prior to visit.    No Known Allergies  Family History  Problem Relation Age of Onset   Hypertension Father    Colon cancer Neg Hx    Diabetes Neg Hx     BP 130/64    Pulse 67    Ht 5\' 11"  (1.803 m)    Wt 232 lb 12.8 oz (105.6 kg)    SpO2 97%    BMI 32.47 kg/m    Review of Systems     Objective:   Physical Exam    Lab Results  Component Value Date   HGBA1C 9.5 (A) 03/13/2021       Assessment & Plan:  Insulin-requiring type 2 DM:  uncontrolled.  Hypoglycemia, due to insulin: The pattern of his cbg's indicates he needs insulin in the AM only  Patient Instructions  check your blood sugar twice a day.  vary the time of day when you check, between before the 3 meals, and at bedtime.  also check if you have symptoms of your blood sugar being too high or too low.  please keep a record of the readings and bring it to your next appointment here (or you can bring the meter itself).  You can write it on any piece of paper.  please call us sooner if your blood sugar goes below 70, or if most of your readings are over 200.  We will need to take this complex situation in stages.   Please continue the same glimepiride for now, and:  increase the NPH insulin to 50 units each morning, and:  Please come back for a follow-up appointment in 2 months.

## 2021-03-13 NOTE — Patient Instructions (Addendum)
check your blood sugar twice a day.  vary the time of day when you check, between before the 3 meals, and at bedtime.  also check if you have symptoms of your blood sugar being too high or too low.  please keep a record of the readings and bring it to your next appointment here (or you can bring the meter itself).  You can write it on any piece of paper.  please call us sooner if your blood sugar goes below 70, or if most of your readings are over 200.  We will need to take this complex situation in stages.   Please continue the same glimepiride for now, and:  increase the NPH insulin to 50 units each morning, and:  Please come back for a follow-up appointment in 2 months.

## 2021-03-28 DIAGNOSIS — Z1283 Encounter for screening for malignant neoplasm of skin: Secondary | ICD-10-CM | POA: Diagnosis not present

## 2021-03-28 DIAGNOSIS — Z85820 Personal history of malignant melanoma of skin: Secondary | ICD-10-CM | POA: Diagnosis not present

## 2021-03-28 DIAGNOSIS — D225 Melanocytic nevi of trunk: Secondary | ICD-10-CM | POA: Diagnosis not present

## 2021-03-28 DIAGNOSIS — D0359 Melanoma in situ of other part of trunk: Secondary | ICD-10-CM | POA: Diagnosis not present

## 2021-03-28 DIAGNOSIS — D485 Neoplasm of uncertain behavior of skin: Secondary | ICD-10-CM | POA: Diagnosis not present

## 2021-03-28 DIAGNOSIS — Z08 Encounter for follow-up examination after completed treatment for malignant neoplasm: Secondary | ICD-10-CM | POA: Diagnosis not present

## 2021-04-11 DIAGNOSIS — L98429 Non-pressure chronic ulcer of back with unspecified severity: Secondary | ICD-10-CM | POA: Diagnosis not present

## 2021-04-11 DIAGNOSIS — D2272 Melanocytic nevi of left lower limb, including hip: Secondary | ICD-10-CM | POA: Diagnosis not present

## 2021-04-11 DIAGNOSIS — D0359 Melanoma in situ of other part of trunk: Secondary | ICD-10-CM | POA: Diagnosis not present

## 2021-04-11 DIAGNOSIS — D485 Neoplasm of uncertain behavior of skin: Secondary | ICD-10-CM | POA: Diagnosis not present

## 2021-04-16 DIAGNOSIS — N183 Chronic kidney disease, stage 3 unspecified: Secondary | ICD-10-CM | POA: Diagnosis not present

## 2021-04-16 DIAGNOSIS — I130 Hypertensive heart and chronic kidney disease with heart failure and stage 1 through stage 4 chronic kidney disease, or unspecified chronic kidney disease: Secondary | ICD-10-CM | POA: Diagnosis not present

## 2021-04-16 DIAGNOSIS — Z6832 Body mass index (BMI) 32.0-32.9, adult: Secondary | ICD-10-CM | POA: Diagnosis not present

## 2021-04-16 DIAGNOSIS — E1129 Type 2 diabetes mellitus with other diabetic kidney complication: Secondary | ICD-10-CM | POA: Diagnosis not present

## 2021-04-16 DIAGNOSIS — M150 Primary generalized (osteo)arthritis: Secondary | ICD-10-CM | POA: Diagnosis not present

## 2021-04-16 DIAGNOSIS — Z1331 Encounter for screening for depression: Secondary | ICD-10-CM | POA: Diagnosis not present

## 2021-04-16 DIAGNOSIS — I10 Essential (primary) hypertension: Secondary | ICD-10-CM | POA: Diagnosis not present

## 2021-04-16 DIAGNOSIS — G894 Chronic pain syndrome: Secondary | ICD-10-CM | POA: Diagnosis not present

## 2021-04-16 DIAGNOSIS — M5030 Other cervical disc degeneration, unspecified cervical region: Secondary | ICD-10-CM | POA: Diagnosis not present

## 2021-04-25 DIAGNOSIS — D485 Neoplasm of uncertain behavior of skin: Secondary | ICD-10-CM | POA: Diagnosis not present

## 2021-04-25 DIAGNOSIS — L988 Other specified disorders of the skin and subcutaneous tissue: Secondary | ICD-10-CM | POA: Diagnosis not present

## 2021-05-04 DIAGNOSIS — I5023 Acute on chronic systolic (congestive) heart failure: Secondary | ICD-10-CM | POA: Diagnosis not present

## 2021-05-04 DIAGNOSIS — E1122 Type 2 diabetes mellitus with diabetic chronic kidney disease: Secondary | ICD-10-CM | POA: Diagnosis not present

## 2021-05-04 DIAGNOSIS — I130 Hypertensive heart and chronic kidney disease with heart failure and stage 1 through stage 4 chronic kidney disease, or unspecified chronic kidney disease: Secondary | ICD-10-CM | POA: Diagnosis not present

## 2021-05-04 DIAGNOSIS — E782 Mixed hyperlipidemia: Secondary | ICD-10-CM | POA: Diagnosis not present

## 2021-05-14 ENCOUNTER — Ambulatory Visit: Payer: Medicare HMO | Admitting: Cardiology

## 2021-05-14 ENCOUNTER — Encounter: Payer: Self-pay | Admitting: Cardiology

## 2021-05-14 ENCOUNTER — Encounter: Payer: Self-pay | Admitting: *Deleted

## 2021-05-14 VITALS — BP 132/65 | HR 74 | Ht 71.0 in | Wt 231.2 lb

## 2021-05-14 DIAGNOSIS — I10 Essential (primary) hypertension: Secondary | ICD-10-CM | POA: Diagnosis not present

## 2021-05-14 DIAGNOSIS — I4891 Unspecified atrial fibrillation: Secondary | ICD-10-CM | POA: Diagnosis not present

## 2021-05-14 DIAGNOSIS — I2510 Atherosclerotic heart disease of native coronary artery without angina pectoris: Secondary | ICD-10-CM | POA: Diagnosis not present

## 2021-05-14 DIAGNOSIS — D6869 Other thrombophilia: Secondary | ICD-10-CM | POA: Diagnosis not present

## 2021-05-14 NOTE — Addendum Note (Signed)
Addended by: Sung Amabile on: 05/14/2021 03:45 PM ? ? Modules accepted: Orders ? ?

## 2021-05-14 NOTE — Patient Instructions (Signed)
Medication Instructions:  Continue all current medications.   Labwork: none  Testing/Procedures: none  Follow-Up: 6 months   Any Other Special Instructions Will Be Listed Below (If Applicable).   If you need a refill on your cardiac medications before your next appointment, please call your pharmacy.  

## 2021-05-14 NOTE — Progress Notes (Signed)
? ? ? ?Clinical Summary ?Xavier Caldwell is a 71 y.o.male seen today for follow up of the following medical problem.s  ?  ?1. CAD ?- admit 04/2018 with inferior STEMI ?- received DES to RCA. Complicated VT arrest requiring defib in the cath lab and by bradycardia, required temp pacer. Beta blocker not started due to bradycardia ?Due to afib was on triple therapy ASA/plavix/DOAC ?- 04/2018 echo LVEF 45-50%,  ?  ?- no recent chest pains ?- compliant with meds ?  ?  ?  ?2. Afib ?- new diagnosis during 04/2018 admission with STEMI ?- management complicated by bradycardia ?- was on admiodarone, s/p DCCV. Amiodarone was discontinued at outpatient f/u ?- has had recurernt afib since that time, was not isolated to his STEM ? ?  ?  ?- no palpitations ?- no bleeding on eliquis.  ?  ?3. HTN ?- home bp's 120s/60s ?  ?4. Hyperlipemia ?09/2020 TC 111 TG 77 HDL 30 LDL 66 ?- he reprts more recent labs with pcp ?- he is on atorva 80 and zetia '10mg'$  ?  ?  ?5. Carotid stenosis ?- followed by vascular Dr Carlis Abbott ?- has had bilateral CEAs ?- from vascular note due to f/u around 06/2021 with repeat carotid US ?  ?6. CKD 3 ? ? ? ?  ?[place]; ? ?[facility] ?  ? ? ?Past Medical History:  ?Diagnosis Date  ? Acute systolic (congestive) heart failure (HCC)   ? Anxiety   ? Arthritis   ? Atrial fibrillation ([place])   ? Back pain   ? Chronic kidney disease   ? pt told by PA of Dr. [name] that he had "stage 3 kidney so they had me stop taking Metformin"  ? Coronary artery disease   ? Diabetes mellitus without complication ([place])   ? High cholesterol   ? History of gout   ? Hypertension   ? Myocardial infarction ([place]) 04/2018  ? Neuropathy   ? PAD (peripheral artery disease) ([place])   ? Retinal detachment   ? One on the right and two on the left  ? Sleep apnea   ? Stop Bang score of 4  ? STEMI (ST elevation myocardial infarction) ([place])   ? 04/08/2018 PCI/DES RCA  ? Stroke [facility])   ? February 2022  ? VT (ventricular tachycardia)   ? ? ? ?No Known  Allergies ? ? ?Current Outpatient Medications  ?Medication Sig Dispense Refill  ? ALPRAZolam (XANAX) 0.5 MG tablet Take 0.5 mg by mouth 2 (two) times daily as needed for anxiety.    ? amLODipine (NORVASC) 10 MG tablet Take 1 tablet (10 mg total) by mouth daily. 30 tablet 1  ? apixaban (ELIQUIS) 5 MG TABS tablet Take 1 tablet (5 mg total) by mouth 2 (two) times daily. Restart medication tomorrow, 09/12/2020 60 tablet 6  ? atorvastatin (LIPITOR) 80 MG tablet TAKE ONE TABLET BY MOUTH EVERYDAY AT BEDTIME (Patient taking differently: Take 80 mg by mouth at bedtime.) 90 tablet 2  ? B Complex-C (SUPER B COMPLEX PO) Take 1 tablet by mouth daily.     ? carvedilol (COREG) 12.5 MG tablet TAKE ONE TABLET BY MOUTH TWICE DAILY 180 tablet 1  ? DULoxetine (CYMBALTA) 60 MG capsule Take 60 mg by mouth daily with lunch.     ? ergocalciferol (VITAMIN D2) 50000 units capsule Take 50,000 Units by mouth every Sunday.     ? ezetimibe (ZETIA) 10 MG tablet Take 1 tablet (10 mg total) by mouth daily.  30 tablet 5  ? gabapentin (NEURONTIN) 300 MG capsule Take 300 mg by mouth 2 (two) times daily.     ? glimepiride (AMARYL) 4 MG tablet Take 4 mg by mouth daily.    ? glucose blood (ONETOUCH ULTRA) test strip USE ONE TEST STRIP AS DIRECTED TWICE DAILY 100 each 0  ? hydrALAZINE (APRESOLINE) 50 MG tablet Take 1 tablet (50 mg total) by mouth 3 (three) times daily. 270 tablet 3  ? insulin NPH Human (NOVOLIN N RELION) 100 UNIT/ML injection Inject 0.5 mLs (50 Units total) into the skin every morning. And syringes 1/day 20 mL 11  ? mupirocin ointment (BACTROBAN) 2 % Apply 1 application topically daily as needed (melonoma).    ? nitroGLYCERIN (NITROSTAT) 0.4 MG SL tablet Place 1 tablet (0.4 mg total) under the tongue every 5 (five) minutes as needed. (Patient taking differently: Place 0.4 mg under the tongue every 5 (five) minutes as needed for chest pain.) 25 tablet 2  ? oxyCODONE-acetaminophen (PERCOCET/ROXICET) 5-325 MG tablet Take 1-2 tablets by mouth  every 4 (four) hours as needed for moderate pain. 10 tablet 0  ? rOPINIRole (REQUIP) 1 MG tablet Take 1 mg by mouth 4 (four) times daily.     ? timolol (TIMOPTIC) 0.5 % ophthalmic solution INSTILL 1 DROP INTO EACH EYE TWICE DAILY (Patient taking differently: Place 1 drop into both eyes 2 (two) times daily.) 10 mL 10  ? UNIFINE PENTIPS 31G X 5 MM MISC USE TO INJECT INSULIN AT BEDTIME (Patient taking differently: at bedtime.) 100 each 5  ? ?No current facility-administered medications for this visit.  ? ? ? ?Past Surgical History:  ?Procedure Laterality Date  ? APPENDECTOMY    ? CARDIOVERSION N/A 04/13/2018  ? Procedure: CARDIOVERSION;  Surgeon: Skeet Latch, MD;  Location: [place];  Service: Cardiovascular;  Laterality: N/A;  ? CATARACT EXTRACTION W/PHACO Right 04/25/2014  ? Procedure: CATARACT EXTRACTION PHACO AND INTRAOCULAR LENS PLACEMENT (IOC);  Surgeon: Williams Che, MD;  [facility];  Service: Ophthalmology;  Laterality: Right;  CDE:4.70  ? CATARACT EXTRACTION W/PHACO Left 07/11/2014  ? Procedure: CATARACT EXTRACTION PHACO AND INTRAOCULAR LENS PLACEMENT LEFT EYE CDE=8.32;  Surgeon: Williams Che, MD;  [facility];  Service: Ophthalmology;  Laterality: Left;  ? COLONOSCOPY WITH PROPOFOL N/A 09/23/2016  ? Rourk: 9 polyps removed, multiple tubular adenomas. recommended 3 yr surveillance  ? COLONOSCOPY WITH PROPOFOL N/A 12/27/2019  ? ten sessile polyps, 4-9 mm in size in descending, hepatic flexure, and cecum, 11 mm polyp in sigmoid, three sessile polyps in rectum and mid rectum 2-3 mm in size. Tubular adenomas and hyperplastic. 3 year surveillance  ? CORONARY/GRAFT ACUTE MI REVASCULARIZATION N/A 04/07/2018  ? Procedure: Coronary/Graft Acute MI Revascularization;  Surgeon: Burnell Blanks, MD;  Location: [place] CV [facility];  Service: Cardiovascular;  Laterality: N/A;  ? ENDARTERECTOMY Right 03/29/2020  ? Procedure: RIGHT CAROTID ARTERY ENDARTERECTOMY WITH PATCH ANGIOPLASTY;  Surgeon:  Marty Heck, MD;  Location: [place];  Service: Vascular;  Laterality: Right;  ? ENDARTERECTOMY Left 09/11/2020  ? Procedure: ENDARTERECTOMY CAROTID LEFT;  Surgeon: Marty Heck, MD;  Location: [place];  Service: Vascular;  Laterality: Left;  ? LEFT HEART CATH AND CORONARY ANGIOGRAPHY N/A 04/07/2018  ? Procedure: LEFT HEART CATH AND CORONARY ANGIOGRAPHY;  Surgeon: Burnell Blanks, MD;  Location: [place] CV LAB;  Service: Cardiovascular;  Laterality: N/A;  ? MELANOMA EXCISION  07/2019  ? PATCH ANGIOPLASTY Left 09/11/2020  ? Procedure: PATCH ANGIOPLASTY;  Surgeon: Marty Heck, MD;  Location: [place];  Service: Vascular;  Laterality: Left;  ? POLYPECTOMY  09/23/2016  ? Procedure: POLYPECTOMY;  Surgeon: Daneil Dolin, MD;  [facility];  Service: Endoscopy;;  colon  ? POLYPECTOMY  12/27/2019  ? Procedure: POLYPECTOMY;  Surgeon: Daneil Dolin, MD;  [facility];  Service: Endoscopy;;  ? RETINAL DETACHMENT SURGERY    ? ? ? ?No Known Allergies ? ? ? ?Family History  ?Problem Relation Age of Onset  ? Hypertension Father   ? Colon cancer Neg Hx   ? Diabetes Neg Hx   ? ? ? ?Social History ?Mr. Brahm reports that he quit smoking about 8 years ago. His smoking use included cigarettes. He has a 45.00 pack-year smoking history. He has never used smokeless tobacco. ?Mr. Ferrando reports no history of alcohol use. ? ? ?Review of Systems ?CONSTITUTIONAL: No weight loss, fever, chills, weakness or fatigue.  ?HEENT: Eyes: No visual loss, blurred vision, double vision or yellow sclerae.No hearing loss, sneezing, congestion, runny nose or sore throat.  ?SKIN: No rash or itching.  ?CARDIOVASCULAR: per hpi ?RESPIRATORY: No shortness of breath, cough or sputum.  ?GASTROINTESTINAL: No anorexia, nausea, vomiting or diarrhea. No abdominal pain or blood.  ?GENITOURINARY: No burning on urination, no polyuria ?NEUROLOGICAL: No headache, dizziness, syncope, paralysis, ataxia, numbness or tingling in  the extremities. No change in bowel or bladder control.  ?MUSCULOSKELETAL: No muscle, back pain, joint pain or stiffness.  ?LYMPHATICS: No enlarged nodes. No history of splenectomy.  ?PSYCHIATRIC: No history of depression o

## 2021-05-29 ENCOUNTER — Ambulatory Visit: Payer: Medicare HMO | Admitting: Endocrinology

## 2021-05-29 ENCOUNTER — Other Ambulatory Visit: Payer: Self-pay | Admitting: Cardiology

## 2021-05-29 ENCOUNTER — Other Ambulatory Visit: Payer: Self-pay | Admitting: Physician Assistant

## 2021-06-03 DIAGNOSIS — I5023 Acute on chronic systolic (congestive) heart failure: Secondary | ICD-10-CM | POA: Diagnosis not present

## 2021-06-03 DIAGNOSIS — I130 Hypertensive heart and chronic kidney disease with heart failure and stage 1 through stage 4 chronic kidney disease, or unspecified chronic kidney disease: Secondary | ICD-10-CM | POA: Diagnosis not present

## 2021-06-03 DIAGNOSIS — E782 Mixed hyperlipidemia: Secondary | ICD-10-CM | POA: Diagnosis not present

## 2021-06-03 DIAGNOSIS — E1122 Type 2 diabetes mellitus with diabetic chronic kidney disease: Secondary | ICD-10-CM | POA: Diagnosis not present

## 2021-06-07 ENCOUNTER — Other Ambulatory Visit: Payer: Self-pay | Admitting: Physician Assistant

## 2021-06-20 ENCOUNTER — Other Ambulatory Visit: Payer: Self-pay | Admitting: *Deleted

## 2021-06-20 DIAGNOSIS — H52223 Regular astigmatism, bilateral: Secondary | ICD-10-CM | POA: Diagnosis not present

## 2021-06-20 DIAGNOSIS — I6523 Occlusion and stenosis of bilateral carotid arteries: Secondary | ICD-10-CM

## 2021-06-20 DIAGNOSIS — Z0100 Encounter for examination of eyes and vision without abnormal findings: Secondary | ICD-10-CM | POA: Diagnosis not present

## 2021-07-03 ENCOUNTER — Encounter: Payer: Self-pay | Admitting: Vascular Surgery

## 2021-07-03 ENCOUNTER — Ambulatory Visit: Payer: Medicare HMO | Admitting: Vascular Surgery

## 2021-07-03 ENCOUNTER — Ambulatory Visit (HOSPITAL_COMMUNITY)
Admission: RE | Admit: 2021-07-03 | Discharge: 2021-07-03 | Disposition: A | Discharge status: Home / Self Care | Payer: Medicare HMO | Source: Ambulatory Visit | Attending: Vascular Surgery | Admitting: Vascular Surgery

## 2021-07-03 VITALS — BP 190/88 | HR 68 | Temp 98.0°F | Resp 16 | Ht 64.0 in | Wt 232.0 lb

## 2021-07-03 DIAGNOSIS — I6523 Occlusion and stenosis of bilateral carotid arteries: Secondary | ICD-10-CM | POA: Insufficient documentation

## 2021-07-03 NOTE — Progress Notes (Signed)
Patient name: Xavier Caldwell MRN: [id number] DOB: 11-Nov-1950 Sex: male  REASON FOR VISIT: 97-monthfollow-up for surveillance of carotid artery disease  HPI: Xavier MATTERSis a 71y.o. male with history of coronary disease status post NSTEMI complicated by V. tach arrest, A. fib on Eliquis, carotid disease, HTN, HLD that presents for surveillance of carotid artery disease.  Most recently underwent left carotid endarterectomy for an asymptomatic high-grade stenosis on 09/11/2020.  He also previously underwent right carotid endarterectomy on 03/29/2020 for symptomatic high-grade stenosis.  [facility] that progressed to left upper and lower extremity weakness concerning for TIA.    On follow-up today reports no neurologic events over the last 9 months.  He is taking Eliquis but not aspirin.  He is on high-dose statin.   Past Medical History:  Diagnosis Date   Acute systolic (congestive) heart failure (HCC)    Anxiety    Arthritis    Atrial fibrillation (HCC)    Back pain    Chronic kidney disease    pt told by PA of Dr. NDorris Fetchthat he had "stage 3 kidney so they had me stop taking Metformin"   Coronary artery disease    Diabetes mellitus without complication (HCC)    High cholesterol    History of gout    Hypertension    [facility] infarction [facility] 04/2018   Neuropathy    PAD (peripheral artery disease) (HCC)    Retinal detachment    One on the right and two on the left   Sleep apnea    Stop Bang score of 4   [place]    04/08/2018 PCI/DES RCA   Stroke [facility]    February 2022   VT (ventricular tachycardia) ([facility]     Past Surgical History:  Procedure Laterality Date   APPENDECTOMY     CARDIOVERSION N/A 04/13/2018   Procedure: CARDIOVERSION;  Surgeon: RSkeet Latch MD;  [place]  Service: Cardiovascular;  Laterality: N/A;   CATARACT EXTRACTION  W/PHACO Right 04/25/2014   Procedure: CATARACT EXTRACTION PHACO AND INTRAOCULAR LENS PLACEMENT ([place];  Surgeon: CWilliams Che MD;  [facility];  Service: Ophthalmology;  Laterality: Right;  CDE:4.70   CATARACT EXTRACTION W/PHACO Left 07/11/2014   Procedure: CATARACT EXTRACTION PHACO AND INTRAOCULAR LENS PLACEMENT LEFT EYE CDE=8.32;  Surgeon: CWilliams Che MD;  [facility];  Service: Ophthalmology;  Laterality: Left;   COLONOSCOPY WITH PROPOFOL N/A 09/23/2016   Rourk: 9 polyps removed, multiple tubular adenomas. recommended 3 yr surveillance   COLONOSCOPY WITH PROPOFOL N/A 12/27/2019   ten sessile polyps, 4-9 mm in size in descending, hepatic flexure, and cecum, 11 mm polyp in sigmoid, three sessile polyps in rectum and mid rectum 2-3 mm in size. Tubular adenomas and hyperplastic. 3 year surveillance   CORONARY/GRAFT ACUTE MI REVASCULARIZATION N/A 04/07/2018   Procedure: Coronary/Graft Acute MI Revascularization;  Surgeon: MBurnell Blanks MD;  [place];  Service: Cardiovascular;  Laterality: N/A;   ENDARTERECTOMY Right 03/29/2020   Procedure: RIGHT CAROTID ARTERY ENDARTERECTOMY WITH PATCH ANGIOPLASTY;  Surgeon: CMarty Heck MD;  [place] [address] [address]  Service: Vascular;  Laterality: Right;   ENDARTERECTOMY Left 09/11/2020   Procedure: ENDARTERECTOMY CAROTID LEFT;  Surgeon: CMarty Heck MD;  [place]  Service: Vascular;  Laterality: Left;   LEFT HEART CATH AND CORONARY ANGIOGRAPHY N/A 04/07/2018  Procedure: LEFT HEART CATH AND CORONARY ANGIOGRAPHY;  Surgeon: Burnell Blanks, MD;  Location: [place] [facility] LAB;  Service: Cardiovascular;  Laterality: N/A;   MELANOMA EXCISION  07/2019   PATCH ANGIOPLASTY Left 09/11/2020   Procedure: PATCH ANGIOPLASTY;  Surgeon: Marty Heck, MD;  [facility];  Service: Vascular;  Laterality: Left;   POLYPECTOMY  09/23/2016   Procedure: POLYPECTOMY;  Surgeon: Daneil Dolin, MD;  [facility];  Service: Endoscopy;;  colon   POLYPECTOMY  12/27/2019   Procedure: POLYPECTOMY;  Surgeon: Daneil Dolin, MD;  [facility];  Service: Endoscopy;;   RETINAL DETACHMENT SURGERY      Family History  Problem Relation Age of Onset   Hypertension Father    Colon cancer Neg Hx    Diabetes Neg Hx     SOCIAL HISTORY: Social History   Tobacco Use   Smoking status: Former    Packs/day: 1.50    Years: 30.00    Pack years: 45.00    Types: Cigarettes    Quit date: 07/05/2012    Years since quitting: 9.0   Smokeless tobacco: Never  Substance Use Topics   Alcohol use: No    No Known Allergies  Current Outpatient Medications  Medication Sig Dispense Refill   ALPRAZolam (XANAX) 0.5 MG tablet Take 0.5 mg by mouth 2 (two) times daily as needed for anxiety.     amLODipine (NORVASC) 10 MG tablet Take 1 tablet (10 mg total) by mouth daily. 30 tablet 1   apixaban (ELIQUIS) 5 MG TABS tablet Take 1 tablet (5 mg total) by mouth 2 (two) times daily. Restart medication tomorrow, 09/12/2020 60 tablet 6   atorvastatin (LIPITOR) 80 MG tablet TAKE ONE TABLET BY MOUTH EVERYDAY AT BEDTIME 90 tablet 2   B Complex-C (SUPER B COMPLEX PO) Take 1 tablet by mouth daily.      carvedilol (COREG) 12.5 MG tablet TAKE ONE TABLET BY MOUTH TWICE DAILY 180 tablet 1   DULoxetine (CYMBALTA) 60 MG capsule Take 60 mg by mouth daily with lunch.      ergocalciferol (VITAMIN D2) 50000 units capsule Take 50,000 Units by mouth every Sunday.      ezetimibe (ZETIA) 10 MG tablet Take 1 tablet (10 mg total) by mouth daily. 30 tablet 5   gabapentin (NEURONTIN) 300 MG capsule Take 300 mg by mouth 2 (two) times daily.      glimepiride (AMARYL) 4 MG tablet Take 4 mg by mouth daily.     glucose blood (ONETOUCH ULTRA) test strip USE ONE TEST STRIP AS DIRECTED TWICE DAILY 100 each 0   hydrALAZINE (APRESOLINE) 50 MG tablet Take 1 tablet (50 mg total) by mouth 3 (three) times daily. 270 tablet 3    HYDROcodone-acetaminophen (NORCO/VICODIN) 5-325 MG tablet Take 1 tablet by mouth every 4 (four) hours as needed.     insulin NPH Human (NOVOLIN N RELION) 100 UNIT/ML injection Inject 0.5 mLs (50 Units total) into the skin every morning. And syringes 1/day 20 mL 11   mupirocin ointment (BACTROBAN) 2 % Apply 1 application topically daily as needed (melonoma).     nitroGLYCERIN (NITROSTAT) 0.4 MG SL tablet Place 1 tablet (0.4 mg total) under the tongue every 5 (five) minutes as needed. (Patient taking differently: Place 0.4 mg under the tongue every 5 (five) minutes as needed for chest pain.) 25 tablet 2   rOPINIRole (REQUIP) 1 MG tablet Take 1 mg by mouth 4 (four) times daily.  timolol (TIMOPTIC) 0.5 % ophthalmic solution INSTILL 1 DROP INTO EACH EYE TWICE DAILY (Patient taking differently: Place 1 drop into both eyes 2 (two) times daily.) 10 mL 10   UNIFINE PENTIPS 31G X 5 MM MISC USE TO INJECT INSULIN AT BEDTIME (Patient taking differently: at bedtime.) 100 each 5   oxyCODONE-acetaminophen (PERCOCET/ROXICET) 5-325 MG tablet Take 1-2 tablets by mouth every 4 (four) hours as needed for moderate pain. (Patient not taking: Reported on 07/03/2021) 10 tablet 0   No current facility-administered medications for this visit.    REVIEW OF SYSTEMS:  '[X]'$  denotes positive finding, '[ ]'$  denotes negative finding Cardiac  Comments:  Chest pain or chest pressure:    Shortness of breath upon exertion:    Short of breath when lying flat:    Irregular heart rhythm:        Vascular    Pain in calf, thigh, or hip brought on by ambulation:    Pain in feet at night that wakes you up from your sleep:     Blood clot in your veins:    Leg swelling:         Pulmonary    Oxygen at home:    Productive cough:     Wheezing:         Neurologic    Sudden weakness in arms or legs:     Sudden numbness in arms or legs:     Sudden onset of difficulty speaking or slurred speech:    Temporary loss of vision in one eye:      Problems with dizziness:         Gastrointestinal    Blood in stool:     Vomited blood:         Genitourinary    Burning when urinating:     Blood in urine:        Psychiatric    Major depression:         Hematologic    Bleeding problems:    Problems with blood clotting too easily:        Skin    Rashes or ulcers:        Constitutional    Fever or chills:      PHYSICAL EXAM: Vitals:   07/03/21 1041 07/03/21 1044 07/03/21 1048  BP: (!) 165/83 (!) 197/82 (!) 190/88  Pulse: 67 68 68  Resp: 16    Temp: 98 F (36.7 C)    TempSrc: Temporal    SpO2: 98%    Weight: 232 lb (105.2 kg)    Height: '5\' 4"'$  (1.626 m)      GENERAL: The patient is a well-nourished male, in no acute distress. The vital signs are documented above. CARDIAC: There is a regular rate and rhythm.  VASCULAR:  Bilateral neck incisions well-healed MUSCULOSKELETAL: There are no major deformities or cyanosis. NEUROLOGIC: No focal weakness or paresthesias are detected.  Nerves II through XII grossly intact. SKIN: There are no ulcers or rashes noted. PSYCHIATRIC: The patient has a normal affect.  DATA:   Carotid duplex today shows both carotid endarterectomy sites widely patent with minimal stenosis by velocity criteria.  Assessment/Plan:  71 year old male presents for 9 month follow-up after left carotid endarterectomy on 09/11/2020 for an asymptomatic high-grade stenosis and also a right carotid endarterectomy on 03/29/2020 for a high-grade symptomatic stenosis.  Discussed his carotid duplex today shows no evidence of significant recurrent stenosis with patent endarterectomy sites bilaterally.  He has remained asymptomatic.  Happy  with his progress.  I did asked that he start an 81 mg aspirin for risk reduction and he is on high-dose statin.  I will see him in 1 year with carotid duplex.  Marty Heck, MD [facility] [place] Office: [phone]

## 2021-07-04 DIAGNOSIS — I5023 Acute on chronic systolic (congestive) heart failure: Secondary | ICD-10-CM | POA: Diagnosis not present

## 2021-07-04 DIAGNOSIS — I130 Hypertensive heart and chronic kidney disease with heart failure and stage 1 through stage 4 chronic kidney disease, or unspecified chronic kidney disease: Secondary | ICD-10-CM | POA: Diagnosis not present

## 2021-07-04 DIAGNOSIS — E782 Mixed hyperlipidemia: Secondary | ICD-10-CM | POA: Diagnosis not present

## 2021-07-04 DIAGNOSIS — E1122 Type 2 diabetes mellitus with diabetic chronic kidney disease: Secondary | ICD-10-CM | POA: Diagnosis not present

## 2021-07-25 DIAGNOSIS — Z1283 Encounter for screening for malignant neoplasm of skin: Secondary | ICD-10-CM | POA: Diagnosis not present

## 2021-07-25 DIAGNOSIS — Z08 Encounter for follow-up examination after completed treatment for malignant neoplasm: Secondary | ICD-10-CM | POA: Diagnosis not present

## 2021-07-25 DIAGNOSIS — Z85820 Personal history of malignant melanoma of skin: Secondary | ICD-10-CM | POA: Diagnosis not present

## 2021-07-25 DIAGNOSIS — D225 Melanocytic nevi of trunk: Secondary | ICD-10-CM | POA: Diagnosis not present

## 2021-07-30 ENCOUNTER — Other Ambulatory Visit: Payer: Self-pay | Admitting: *Deleted

## 2021-07-30 MED ORDER — HYDRALAZINE HCL 50 MG PO TABS: 50.0000 mg | ORAL_TABLET | Freq: Three times a day (TID) | ORAL | 3 refills | Status: DC

## 2021-08-14 DIAGNOSIS — E1140 Type 2 diabetes mellitus with diabetic neuropathy, unspecified: Secondary | ICD-10-CM | POA: Diagnosis not present

## 2021-08-14 DIAGNOSIS — M150 Primary generalized (osteo)arthritis: Secondary | ICD-10-CM | POA: Diagnosis not present

## 2021-08-14 DIAGNOSIS — I10 Essential (primary) hypertension: Secondary | ICD-10-CM | POA: Diagnosis not present

## 2021-08-14 DIAGNOSIS — I130 Hypertensive heart and chronic kidney disease with heart failure and stage 1 through stage 4 chronic kidney disease, or unspecified chronic kidney disease: Secondary | ICD-10-CM | POA: Diagnosis not present

## 2021-08-14 DIAGNOSIS — I4891 Unspecified atrial fibrillation: Secondary | ICD-10-CM | POA: Diagnosis not present

## 2021-08-14 DIAGNOSIS — Z6832 Body mass index (BMI) 32.0-32.9, adult: Secondary | ICD-10-CM | POA: Diagnosis not present

## 2021-08-14 DIAGNOSIS — Z0001 Encounter for general adult medical examination with abnormal findings: Secondary | ICD-10-CM | POA: Diagnosis not present

## 2021-08-14 DIAGNOSIS — M5030 Other cervical disc degeneration, unspecified cervical region: Secondary | ICD-10-CM | POA: Diagnosis not present

## 2021-08-14 DIAGNOSIS — N183 Chronic kidney disease, stage 3 unspecified: Secondary | ICD-10-CM | POA: Diagnosis not present

## 2021-08-14 DIAGNOSIS — E1122 Type 2 diabetes mellitus with diabetic chronic kidney disease: Secondary | ICD-10-CM | POA: Diagnosis not present

## 2021-08-14 DIAGNOSIS — E1129 Type 2 diabetes mellitus with other diabetic kidney complication: Secondary | ICD-10-CM | POA: Diagnosis not present

## 2021-08-14 DIAGNOSIS — G629 Polyneuropathy, unspecified: Secondary | ICD-10-CM | POA: Diagnosis not present

## 2021-08-14 DIAGNOSIS — E6609 Other obesity due to excess calories: Secondary | ICD-10-CM | POA: Diagnosis not present

## 2021-08-14 DIAGNOSIS — Z1331 Encounter for screening for depression: Secondary | ICD-10-CM | POA: Diagnosis not present

## 2021-08-15 DIAGNOSIS — E1122 Type 2 diabetes mellitus with diabetic chronic kidney disease: Secondary | ICD-10-CM | POA: Diagnosis not present

## 2021-08-15 DIAGNOSIS — Z0001 Encounter for general adult medical examination with abnormal findings: Secondary | ICD-10-CM | POA: Diagnosis not present

## 2021-08-15 DIAGNOSIS — I10 Essential (primary) hypertension: Secondary | ICD-10-CM | POA: Diagnosis not present

## 2021-08-15 DIAGNOSIS — E559 Vitamin D deficiency, unspecified: Secondary | ICD-10-CM | POA: Diagnosis not present

## 2021-08-15 DIAGNOSIS — Z125 Encounter for screening for malignant neoplasm of prostate: Secondary | ICD-10-CM | POA: Diagnosis not present

## 2021-09-04 DIAGNOSIS — R946 Abnormal results of thyroid function studies: Secondary | ICD-10-CM | POA: Diagnosis not present

## 2021-10-24 DIAGNOSIS — Z6832 Body mass index (BMI) 32.0-32.9, adult: Secondary | ICD-10-CM | POA: Diagnosis not present

## 2021-10-24 DIAGNOSIS — I10 Essential (primary) hypertension: Secondary | ICD-10-CM | POA: Diagnosis not present

## 2021-10-24 DIAGNOSIS — E119 Type 2 diabetes mellitus without complications: Secondary | ICD-10-CM | POA: Diagnosis not present

## 2021-10-24 DIAGNOSIS — N183 Chronic kidney disease, stage 3 unspecified: Secondary | ICD-10-CM | POA: Diagnosis not present

## 2021-10-24 DIAGNOSIS — G894 Chronic pain syndrome: Secondary | ICD-10-CM | POA: Diagnosis not present

## 2021-10-24 DIAGNOSIS — E1140 Type 2 diabetes mellitus with diabetic neuropathy, unspecified: Secondary | ICD-10-CM | POA: Diagnosis not present

## 2021-10-24 DIAGNOSIS — I130 Hypertensive heart and chronic kidney disease with heart failure and stage 1 through stage 4 chronic kidney disease, or unspecified chronic kidney disease: Secondary | ICD-10-CM | POA: Diagnosis not present

## 2021-10-24 DIAGNOSIS — G629 Polyneuropathy, unspecified: Secondary | ICD-10-CM | POA: Diagnosis not present

## 2021-10-24 DIAGNOSIS — M150 Primary generalized (osteo)arthritis: Secondary | ICD-10-CM | POA: Diagnosis not present

## 2021-10-24 DIAGNOSIS — E6609 Other obesity due to excess calories: Secondary | ICD-10-CM | POA: Diagnosis not present

## 2021-10-30 DIAGNOSIS — Z23 Encounter for immunization: Secondary | ICD-10-CM | POA: Diagnosis not present

## 2021-10-31 DIAGNOSIS — D225 Melanocytic nevi of trunk: Secondary | ICD-10-CM | POA: Diagnosis not present

## 2021-10-31 DIAGNOSIS — Z08 Encounter for follow-up examination after completed treatment for malignant neoplasm: Secondary | ICD-10-CM | POA: Diagnosis not present

## 2021-10-31 DIAGNOSIS — Z1283 Encounter for screening for malignant neoplasm of skin: Secondary | ICD-10-CM | POA: Diagnosis not present

## 2021-10-31 DIAGNOSIS — Z85820 Personal history of malignant melanoma of skin: Secondary | ICD-10-CM | POA: Diagnosis not present

## 2021-11-20 ENCOUNTER — Encounter: Payer: Self-pay | Admitting: *Deleted

## 2021-11-20 ENCOUNTER — Ambulatory Visit: Payer: Medicare HMO | Attending: Cardiology | Admitting: Cardiology

## 2021-11-20 ENCOUNTER — Encounter: Payer: Self-pay | Admitting: Cardiology

## 2021-11-20 VITALS — BP 138/78 | HR 74 | Ht 71.0 in | Wt 233.0 lb

## 2021-11-20 DIAGNOSIS — E782 Mixed hyperlipidemia: Secondary | ICD-10-CM

## 2021-11-20 DIAGNOSIS — I4891 Unspecified atrial fibrillation: Secondary | ICD-10-CM

## 2021-11-20 DIAGNOSIS — I2510 Atherosclerotic heart disease of native coronary artery without angina pectoris: Secondary | ICD-10-CM | POA: Diagnosis not present

## 2021-11-20 DIAGNOSIS — I10 Essential (primary) hypertension: Secondary | ICD-10-CM | POA: Diagnosis not present

## 2021-11-20 DIAGNOSIS — D6869 Other thrombophilia: Secondary | ICD-10-CM

## 2021-11-20 NOTE — Patient Instructions (Signed)
Medication Instructions:  Continue all current medications.   Labwork: none  Testing/Procedures: none  Follow-Up: 6 months   Any Other Special Instructions Will Be Listed Below (If Applicable).   If you need a refill on your cardiac medications before your next appointment, please call your pharmacy.  

## 2021-11-20 NOTE — Progress Notes (Signed)
Clinical Summary Xavier Caldwell is a 71 y.o.male seen today for follow up of the following medical problem.s    1. CAD - admit 04/2018 with inferior STEMI - received DES to RCA. Complicated VT arrest requiring defib in the cath lab and by bradycardia, required temp pacer.  Due to afib was on triple therapy ASA/plavix/DOAC - 04/2018 echo LVEF 45-50%,    -no chest pains, no SOB/DOE - compliant with meds       2. Afib - new diagnosis during 04/2018 admission with STEMI - management complicated by bradycardia - was on admiodarone, s/p DCCV. Amiodarone was discontinued at outpatient f/u - has had recurernt afib since that time, was not isolated to his STEMI   - no palpitaitons - no bleeding on eliquis.    3. HTN - home bp's 110s-130s/50s-70s -compliant with meds   4. Hyperlipemia 09/2020 TC 111 TG 77 HDL 30 LDL 66 - he is on atorva 80 and zetia '10mg'$  - recent labs with pcp     5. Carotid stenosis - followed by vascular Dr Xavier Caldwell - has had bilateral CEAs - followed by vascular Dr Xavier Caldwell   6. Hyperkalemia/AKI - admit 03/2020 with hyperkalemia, K up to 8, Cr was 2.1. Presented with CVA like symptoms. Occurred when lisinopril was increased.  - lisinopril was stopped           [employer], [employer] Past Medical History:  Diagnosis Date   Acute systolic (congestive) heart failure (HCC)    Anxiety    Arthritis    Atrial fibrillation (HCC)    Back pain    Chronic kidney disease    pt told by PA of Dr. [name] that he had "stage 3 kidney so they had me stop taking Metformin"   Coronary artery disease    Diabetes mellitus without complication (HCC)    High cholesterol    History of gout    Hypertension    Myocardial infarction ([name]) 04/2018   Neuropathy    PAD (peripheral artery disease) (HCC)    Retinal detachment    One on the right and two on the left   Sleep apnea    Stop Bang score of 4   STEMI (ST elevation myocardial infarction) ([name])    04/08/2018 PCI/DES  RCA   Stroke [name])    February 2022   VT (ventricular tachycardia) (HCC)      No Known Allergies   Current Outpatient Medications  Medication Sig Dispense Refill   ALPRAZolam (XANAX) 0.5 MG tablet Take 0.5 mg by mouth 2 (two) times daily as needed for anxiety.     amLODipine (NORVASC) 10 MG tablet Take 1 tablet (10 mg total) by mouth daily. 30 tablet 1   apixaban (ELIQUIS) 5 MG TABS tablet Take 1 tablet (5 mg total) by mouth 2 (two) times daily. Restart medication tomorrow, 09/12/2020 60 tablet 6   atorvastatin (LIPITOR) 80 MG tablet TAKE ONE TABLET BY MOUTH EVERYDAY AT BEDTIME 90 tablet 2   carvedilol (COREG) 12.5 MG tablet TAKE ONE TABLET BY MOUTH TWICE DAILY 180 tablet 1   ergocalciferol (VITAMIN D2) 50000 units capsule Take 50,000 Units by mouth every Sunday.      ezetimibe (ZETIA) 10 MG tablet Take 1 tablet (10 mg total) by mouth daily. 30 tablet 5   gabapentin (NEURONTIN) 300 MG capsule Take 300 mg by mouth 2 (two) times daily.      glimepiride (AMARYL) 4 MG tablet Take 4 mg by mouth  daily.     glucose blood (ONETOUCH ULTRA) test strip USE ONE TEST STRIP AS DIRECTED TWICE DAILY 100 each 0   hydrALAZINE (APRESOLINE) 50 MG tablet Take 1 tablet (50 mg total) by mouth 3 (three) times daily. 270 tablet 3   HYDROcodone-acetaminophen (NORCO/VICODIN) 5-325 MG tablet Take 1 tablet by mouth every 4 (four) hours as needed.     insulin NPH Human (NOVOLIN N RELION) 100 UNIT/ML injection Inject 0.5 mLs (50 Units total) into the skin every morning. And syringes 1/day 20 mL 11   mupirocin ointment (BACTROBAN) 2 % Apply 1 application topically daily as needed (melonoma).     nitroGLYCERIN (NITROSTAT) 0.4 MG SL tablet Place 1 tablet (0.4 mg total) under the tongue every 5 (five) minutes as needed. (Patient taking differently: Place 0.4 mg under the tongue every 5 (five) minutes as needed for chest pain.) 25 tablet 2   rOPINIRole (REQUIP) 1 MG tablet Take 1 mg by mouth 4 (four) times daily.      timolol  (TIMOPTIC) 0.5 % ophthalmic solution INSTILL 1 DROP INTO EACH EYE TWICE DAILY (Patient taking differently: Place 1 drop into both eyes 2 (two) times daily.) 10 mL 10   UNIFINE PENTIPS 31G X 5 MM MISC USE TO INJECT INSULIN AT BEDTIME (Patient taking differently: at bedtime.) 100 each 5   B Complex-C (SUPER B COMPLEX PO) Take 1 tablet by mouth daily.  (Patient not taking: Reported on 11/20/2021)     DULoxetine (CYMBALTA) 60 MG capsule Take 60 mg by mouth daily with lunch.      oxyCODONE-acetaminophen (PERCOCET/ROXICET) 5-325 MG tablet Take 1-2 tablets by mouth every 4 (four) hours as needed for moderate pain. (Patient not taking: Reported on 07/03/2021) 10 tablet 0   No current facility-administered medications for this visit.     Past Surgical History:  Procedure Laterality Date   APPENDECTOMY     CARDIOVERSION N/A 04/13/2018   Procedure: CARDIOVERSION;  Surgeon: Skeet Latch, MD;  Location: [place];  Service: Cardiovascular;  Laterality: N/A;   CATARACT EXTRACTION W/PHACO Right 04/25/2014   Procedure: CATARACT EXTRACTION PHACO AND INTRAOCULAR LENS PLACEMENT ([place]);  Surgeon: Williams Che, MD;  Location: [facility];  Service: Ophthalmology;  Laterality: Right;  CDE:4.70   CATARACT EXTRACTION W/PHACO Left 07/11/2014   Procedure: CATARACT EXTRACTION PHACO AND INTRAOCULAR LENS PLACEMENT LEFT EYE CDE=8.32;  Surgeon: Williams Che, MD;  Location: [facility];  Service: Ophthalmology;  Laterality: Left;   COLONOSCOPY WITH PROPOFOL N/A 09/23/2016   [place]: 9 polyps removed, multiple tubular adenomas. recommended 3 yr surveillance   COLONOSCOPY WITH PROPOFOL N/A 12/27/2019   ten sessile polyps, 4-9 mm in size in descending, hepatic flexure, and cecum, 11 mm polyp in sigmoid, three sessile polyps in rectum and mid rectum 2-3 mm in size. Tubular adenomas and hyperplastic. 3 year surveillance   CORONARY/GRAFT ACUTE MI REVASCULARIZATION N/A 04/07/2018   Procedure: Coronary/Graft Acute MI Revascularization;   Surgeon: Burnell Blanks, MD;  Location: [place] [facility];  Service: Cardiovascular;  Laterality: N/A;   ENDARTERECTOMY Right 03/29/2020   Procedure: RIGHT CAROTID ARTERY ENDARTERECTOMY WITH PATCH ANGIOPLASTY;  Surgeon: Marty Heck, MD;  Location: [place];  Service: Vascular;  Laterality: Right;   ENDARTERECTOMY Left 09/11/2020   Procedure: ENDARTERECTOMY CAROTID LEFT;  Surgeon: Marty Heck, MD;  Location: [place];  Service: Vascular;  Laterality: Left;   LEFT HEART CATH AND CORONARY ANGIOGRAPHY N/A 04/07/2018   Procedure: LEFT HEART CATH AND CORONARY ANGIOGRAPHY;  Surgeon: Angelena Form,  Annita Brod, MD;  Location: [place] [facility];  Service: Cardiovascular;  Laterality: N/A;   MELANOMA EXCISION  07/2019   PATCH ANGIOPLASTY Left 09/11/2020   Procedure: PATCH ANGIOPLASTY;  Surgeon: Marty Heck, MD;  Location: [facility];  Service: Vascular;  Laterality: Left;   POLYPECTOMY  09/23/2016   Procedure: POLYPECTOMY;  Surgeon: Daneil Dolin, MD;  [facility];  Service: Endoscopy;;  colon   POLYPECTOMY  12/27/2019   Procedure: POLYPECTOMY;  Surgeon: Daneil Dolin, MD;  [facility];  Service: Endoscopy;;   RETINAL DETACHMENT SURGERY       No Known Allergies    Family History  Problem Relation Age of Onset   Hypertension Father    Colon cancer Neg Hx    Diabetes Neg Hx      Social History Xavier Caldwell reports that he quit smoking about 9 years ago. His smoking use included cigarettes. He has a 45.00 pack-year smoking history. He has never been exposed to tobacco smoke. He has never used smokeless tobacco. Xavier Caldwell reports no history of alcohol use.   Review of Systems CONSTITUTIONAL: No weight loss, fever, chills, weakness or fatigue.  HEENT: Eyes: No visual loss, blurred vision, double vision or yellow sclerae.No hearing loss, sneezing, congestion, runny nose or sore throat.  SKIN: No rash or itching.  CARDIOVASCULAR: per hpi RESPIRATORY: No  shortness of breath, cough or sputum.  GASTROINTESTINAL: No anorexia, nausea, vomiting or diarrhea. No abdominal pain or blood.  GENITOURINARY: No burning on urination, no polyuria NEUROLOGICAL: No headache, dizziness, syncope, paralysis, ataxia, numbness or tingling in the extremities. No change in bowel or bladder control.  MUSCULOSKELETAL: No muscle, back pain, joint pain or stiffness.  LYMPHATICS: No enlarged nodes. No history of splenectomy.  PSYCHIATRIC: No history of depression or anxiety.  ENDOCRINOLOGIC: No reports of sweating, cold or heat intolerance. No polyuria or polydipsia.  Marland Kitchen   Physical Examination Today's Vitals   11/20/21 1431  BP: (!) 150/70  Pulse: 74  SpO2: 96%  Weight: 233 lb (105.7 kg)  Height: '5\' 11"'$  (1.803 m)   Body mass index is 32.5 kg/m.  Gen: resting comfortably, no acute distress HEENT: no scleral icterus, pupils equal round and reactive, no palptable cervical adenopathy,  CV: RRR no m/r/g no jvd Resp: Clear to auscultation bilaterally GI: abdomen is soft, non-tender, non-distended, normal bowel sounds, no hepatosplenomegaly MSK: extremities are warm, no edema.  Skin: warm, no rash Neuro:  no focal deficits Psych: appropriate affect   Diagnostic Studies  Cath: 04/07/2018   Prox RCA lesion is 100% stenosed. Ost LAD to Prox LAD lesion is 30% stenosed. Mid LAD lesion is 40% stenosed. Ost 2nd Mrg to 2nd Mrg lesion is 40% stenosed. A drug-eluting stent was successfully placed using a STENT RESOLUTE ONYX 3.5X30. Post intervention, there is a 0% residual stenosis.   1. 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 Ventricular fibrillation/cardiac arrest with successful resuscitation 3. Successful PTCA/DES x 1 proximal RCA.  4. Successful aspiration thrombectomy distal RCA 5. Mild to moderate non-obstructive disease in the Circumflex and LAD   Recommendations: Will admit to ICU. Echo in the am. Will continue  Aggrastat for 18 hours. Will continue ASA/Brilinta and statin. Will not start a beta blocker today given hypotension and bradycardia. Ace inh on hold with hypotension.    TTE: 04/08/2018   IMPRESSIONS      1. The left ventricle has mildly reduced systolic function, with  an ejection fraction of 45-50%. The cavity size was normal. There is moderately increased left ventricular wall thickness. 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 Left ventricular diffuse hypokinesis.  2. The right ventricle has mildly reduced systolic function. The cavity was mildly enlarged. There is no increase in right ventricular wall thickness.  3. The mitral valve is degenerative. There is mild mitral annular calcification present.  4. The aortic valve was not well visualized.  5. The aortic root and ascending aorta are normal in size and structure.  6. The inferior vena cava was dilated in size with <50% respiratory variability.  7. Severe hypokinesis of the left ventricular basal to mid inferior and inferoseptal.  8. The interatrial septum was not well visualized.     Assessment and Plan   CAD - no symptoms, continue current meds   2. PAF/acquired thrombophilia - no symptoms, continue current meds incliuding eliquis for stroke prevention   3. HTN -mildly elevated here, home bp's at goal. Continue current meds  4. Hyperlipidemia - request pcp labs, continue current meds     Arnoldo Lenis, M.D

## 2021-11-21 ENCOUNTER — Telehealth: Payer: Self-pay | Admitting: *Deleted

## 2021-11-21 NOTE — Chronic Care Management (AMB) (Signed)
  Care Coordination  Outreach Note  11/21/2021 Name: CHANSON TEEMS MRN: [id number] DOB: [birth year]   Care Coordination Outreach Attempts: An unsuccessful telephone outreach was attempted today to offer the patient information about available care coordination services as a benefit of their health plan.   Follow Up Plan:  Additional outreach attempts will be made to offer the patient care coordination information and services.   Encounter Outcome:  Pt. Request to Call [place]  Direct Dial: [phone]

## 2021-11-26 ENCOUNTER — Other Ambulatory Visit: Payer: Self-pay | Admitting: Cardiology

## 2021-11-28 NOTE — Chronic Care Management (AMB) (Signed)
  Care Coordination   Note   11/28/2021 Name: Xavier Caldwell MRN: [id number] DOB: 08/13/1950  Xavier Caldwell is a 71 y.o. year old male who sees Redmond School, MD for primary care. I reached out to [name] by phone today to offer care coordination services.  [name] was given information about Care Coordination services today including:   The Care Coordination services include support from the care team which includes your Nurse Coordinator, Clinical Social Worker, or Pharmacist.  The Care Coordination team is here to help remove barriers to the health concerns and goals most important to you. Care Coordination services are voluntary, and the patient may decline or stop services at any time by request to their care team member.   Care Coordination Consent Status: Patient agreed to services and verbal consent obtained.   Follow up plan:  Telephone appointment with care coordination team member scheduled for:  12/07/21  Encounter Outcome:  Pt. Scheduled   [place]  Direct Dial: [phone]

## 2021-12-04 DIAGNOSIS — I5023 Acute on chronic systolic (congestive) heart failure: Secondary | ICD-10-CM | POA: Diagnosis not present

## 2021-12-04 DIAGNOSIS — E1122 Type 2 diabetes mellitus with diabetic chronic kidney disease: Secondary | ICD-10-CM | POA: Diagnosis not present

## 2021-12-04 DIAGNOSIS — I130 Hypertensive heart and chronic kidney disease with heart failure and stage 1 through stage 4 chronic kidney disease, or unspecified chronic kidney disease: Secondary | ICD-10-CM | POA: Diagnosis not present

## 2021-12-07 ENCOUNTER — Ambulatory Visit: Payer: Self-pay | Admitting: *Deleted

## 2021-12-07 ENCOUNTER — Encounter: Payer: Self-pay | Admitting: *Deleted

## 2021-12-10 DIAGNOSIS — E1122 Type 2 diabetes mellitus with diabetic chronic kidney disease: Secondary | ICD-10-CM | POA: Diagnosis not present

## 2021-12-13 ENCOUNTER — Encounter: Payer: Self-pay | Admitting: *Deleted

## 2021-12-13 NOTE — Patient Outreach (Signed)
  Care Coordination   Initial Visit Note   12/07/2021 Name: Xavier Caldwell MRN: [id number] DOB: 02/22/1950  Xavier Caldwell is a 71 y.o. year old male who sees Redmond School, MD for primary care. I spoke with  [name] by phone today.  What matters to the patients health and wellness today?  Manage HTN, Diabetes, and decrease risk for complications    Goals Addressed             This Visit's Progress    Care Coordination Services       Care Coordination Interventions: Evaluation of current treatment plan related to hypertension self management and patient's adherence to plan as established by provider Reviewed medications with patient and discussed importance of compliance Counseled on the importance of exercise goals with target of 150 minutes per week Discussed plans with patient for ongoing care management follow up and provided patient with direct contact information for care management team Advised patient, providing education and rationale, to monitor blood pressure daily and record, calling PCP for findings outside established parameters Provided education on prescribed diet low sodium DASH Assessed social determinant of health barriers Provided patient/caregiver with information regarding Care Coordination Services: Services include support from the care team which includes your Nurse Coordinator, Clinical Social Worker, or Pharmacist.  The Care Coordination team is here to help remove barriers to the health concerns and goals most important to you. Care Coordination services are voluntary, and the patient may decline or stop services at any time by request to their care team member.  Patient agreed to services Provided with [facility] direct contact number [phone] and encouraged to reach out as needed Telephone f/u scheduled with [facility] 01/02/22        SDOH assessments and interventions completed:  Yes  SDOH Interventions Today    Flowsheet Row Most Recent Value   SDOH Interventions   Housing Interventions Intervention Not Indicated  Transportation Interventions Intervention Not Indicated  Utilities Interventions Intervention Not Indicated  Financial Strain Interventions Intervention Not Indicated  Social Connections Interventions Intervention Not Indicated        Care Coordination Interventions Activated:  Yes  Care Coordination Interventions:  Yes, provided   Follow up plan: Follow up call scheduled for 01/02/22    Encounter Outcome:  Pt. Visit Completed   Chong Sicilian, BSN, RN-BC RN Care Coordinator [place]: [phone] Main #: [phone]

## 2021-12-26 ENCOUNTER — Other Ambulatory Visit (HOSPITAL_COMMUNITY): Payer: Self-pay | Admitting: Internal Medicine

## 2021-12-26 ENCOUNTER — Other Ambulatory Visit (HOSPITAL_BASED_OUTPATIENT_CLINIC_OR_DEPARTMENT_OTHER): Payer: Medicare HMO

## 2021-12-26 ENCOUNTER — Ambulatory Visit (HOSPITAL_COMMUNITY)
Admission: RE | Admit: 2021-12-26 | Discharge: 2021-12-26 | Disposition: A | Discharge status: Home / Self Care | Payer: Medicare HMO | Source: Ambulatory Visit | Attending: Internal Medicine | Admitting: Internal Medicine

## 2021-12-26 DIAGNOSIS — E6609 Other obesity due to excess calories: Secondary | ICD-10-CM | POA: Diagnosis not present

## 2021-12-26 DIAGNOSIS — M79605 Pain in left leg: Secondary | ICD-10-CM

## 2021-12-26 DIAGNOSIS — E1122 Type 2 diabetes mellitus with diabetic chronic kidney disease: Secondary | ICD-10-CM | POA: Diagnosis not present

## 2021-12-26 DIAGNOSIS — R0602 Shortness of breath: Secondary | ICD-10-CM | POA: Diagnosis not present

## 2021-12-26 DIAGNOSIS — M79602 Pain in left arm: Secondary | ICD-10-CM | POA: Diagnosis not present

## 2021-12-26 DIAGNOSIS — I4891 Unspecified atrial fibrillation: Secondary | ICD-10-CM | POA: Diagnosis not present

## 2021-12-26 DIAGNOSIS — N183 Chronic kidney disease, stage 3 unspecified: Secondary | ICD-10-CM | POA: Diagnosis not present

## 2021-12-26 DIAGNOSIS — Z6833 Body mass index (BMI) 33.0-33.9, adult: Secondary | ICD-10-CM | POA: Diagnosis not present

## 2021-12-26 DIAGNOSIS — I130 Hypertensive heart and chronic kidney disease with heart failure and stage 1 through stage 4 chronic kidney disease, or unspecified chronic kidney disease: Secondary | ICD-10-CM | POA: Diagnosis not present

## 2021-12-26 DIAGNOSIS — R0600 Dyspnea, unspecified: Secondary | ICD-10-CM | POA: Insufficient documentation

## 2021-12-26 DIAGNOSIS — R0601 Orthopnea: Secondary | ICD-10-CM | POA: Diagnosis not present

## 2021-12-28 ENCOUNTER — Ambulatory Visit (HOSPITAL_COMMUNITY): Payer: Medicare HMO

## 2021-12-28 ENCOUNTER — Encounter (HOSPITAL_COMMUNITY): Payer: Self-pay

## 2021-12-31 ENCOUNTER — Ambulatory Visit (HOSPITAL_BASED_OUTPATIENT_CLINIC_OR_DEPARTMENT_OTHER)
Admission: RE | Admit: 2021-12-31 | Discharge: 2021-12-31 | Disposition: A | Discharge status: Home / Self Care | Payer: Medicare HMO | Source: Ambulatory Visit | Attending: Internal Medicine | Admitting: Internal Medicine

## 2021-12-31 DIAGNOSIS — M79622 Pain in left upper arm: Secondary | ICD-10-CM | POA: Diagnosis not present

## 2021-12-31 DIAGNOSIS — M79602 Pain in left arm: Secondary | ICD-10-CM | POA: Insufficient documentation

## 2021-12-31 DIAGNOSIS — M7989 Other specified soft tissue disorders: Secondary | ICD-10-CM | POA: Diagnosis not present

## 2021-12-31 DIAGNOSIS — M79605 Pain in left leg: Secondary | ICD-10-CM | POA: Insufficient documentation

## 2022-01-01 ENCOUNTER — Other Ambulatory Visit (HOSPITAL_COMMUNITY): Payer: Self-pay | Admitting: Internal Medicine

## 2022-01-01 DIAGNOSIS — R9389 Abnormal findings on diagnostic imaging of other specified body structures: Secondary | ICD-10-CM

## 2022-01-02 ENCOUNTER — Encounter: Payer: Self-pay | Admitting: *Deleted

## 2022-01-02 ENCOUNTER — Ambulatory Visit: Payer: Self-pay | Admitting: *Deleted

## 2022-01-03 DIAGNOSIS — I5023 Acute on chronic systolic (congestive) heart failure: Secondary | ICD-10-CM | POA: Diagnosis not present

## 2022-01-03 DIAGNOSIS — I130 Hypertensive heart and chronic kidney disease with heart failure and stage 1 through stage 4 chronic kidney disease, or unspecified chronic kidney disease: Secondary | ICD-10-CM | POA: Diagnosis not present

## 2022-01-03 DIAGNOSIS — E1122 Type 2 diabetes mellitus with diabetic chronic kidney disease: Secondary | ICD-10-CM | POA: Diagnosis not present

## 2022-01-03 NOTE — Patient Outreach (Signed)
Care Coordination   Follow Up Visit Note   01/02/2022 Name: MATHEU PLOEGER MRN: [id number] DOB: September 20, 1950  RIDGELY ANASTACIO is a 71 y.o. year old male who sees Redmond School, MD for primary care. I spoke with  [name] by phone today.  What matters to the patients health and wellness today?  Managing edema and SOB while sleeping    Goals Addressed             This Visit's Progress    COMPLETED: Care Coordination Services       Care Coordination Interventions: Evaluation of current treatment plan related to hypertension self management and patient's adherence to plan as established by provider Reviewed medications with patient and discussed importance of compliance Counseled on the importance of exercise goals with target of 150 minutes per week Discussed plans with patient for ongoing care management follow up and provided patient with direct contact information for care management team Advised patient, providing education and rationale, to monitor blood pressure daily and record, calling PCP for findings outside established parameters Provided education on prescribed diet low sodium DASH Assessed social determinant of health barriers Provided patient/caregiver with information regarding Care Coordination Services: Services include support from the care team which includes your Nurse Coordinator, Clinical Social Worker, or Pharmacist.  The Care Coordination team is here to help remove barriers to the health concerns and goals most important to you. Care Coordination services are voluntary, and the patient may decline or stop services at any time by request to their care team member.  Patient agreed to services Provided with [facility] direct contact number [phone] and encouraged to reach out as needed Telephone f/u scheduled with [facility] 01/02/22     Edema Management       Care Coordination Interventions: Evaluation of current treatment plan related to bilateral LE and left hand  edema and patient's adherence to plan as established by provider Advised patient to check and record weight after waking and to call PCP with any weight gain of >3lbs overnight or >5lbs in one week Reviewed medications with patient and discussed addition of "fluid pill" to help with edema. Left hand edema has resolved and lower extremity has improved. He is urinating more. Discussed timing of doses to decrease sleep interruption. Discussed plans with patient for ongoing care management follow up and provided patient with direct contact information for care management team Advised patient to discuss any new or worsening symptoms with provider Assessed social determinant of health barriers Provided with [facility] contact number and encouraged to reach out as needed      Sleep Disturbance       Care Coordination Interventions: Collaborated with Dr Gerarda Fraction regarding potential need for sleep study due to patient waking up during the night SOB. This scares him. It resolves once he sits up. He can sleep in a recliner with no difficulty, but has trouble when lying flat. He has never had a sleep study. Wife reports snoring but hasn't noticed any pauses in breathing or gasping for air. Does not have SOB with exertion or during normal activities. Could be positional airway obstruction or sleep apnea. Cardiac cause less likely since he does not have SOB at other times.  Discussed plans with patient for ongoing care management follow up and provided patient with direct contact information for care management team Advised patient to discuss any new or worsening symptoms with provider Provided with [facility] contact number and encouraged to reach out as needed  SDOH assessments and interventions completed:  No     Care Coordination Interventions:  Yes, provided   Follow up plan: Follow up call scheduled for 01/24/22    Encounter Outcome:  Pt. Visit Completed   Chong Sicilian, BSN, RN-BC RN Care  Coordinator [place]: [phone] Main #: [phone]

## 2022-01-07 ENCOUNTER — Encounter (INDEPENDENT_AMBULATORY_CARE_PROVIDER_SITE_OTHER): Payer: Medicare HMO | Admitting: Ophthalmology

## 2022-01-07 DIAGNOSIS — H26493 Other secondary cataract, bilateral: Secondary | ICD-10-CM | POA: Diagnosis not present

## 2022-01-07 DIAGNOSIS — H401132 Primary open-angle glaucoma, bilateral, moderate stage: Secondary | ICD-10-CM | POA: Diagnosis not present

## 2022-01-09 ENCOUNTER — Other Ambulatory Visit (HOSPITAL_COMMUNITY): Payer: Medicare HMO

## 2022-01-14 ENCOUNTER — Ambulatory Visit (HOSPITAL_COMMUNITY)
Admission: RE | Admit: 2022-01-14 | Discharge: 2022-01-14 | Disposition: A | Discharge status: Home / Self Care | Payer: Medicare HMO | Source: Ambulatory Visit | Attending: Internal Medicine | Admitting: Internal Medicine

## 2022-01-14 DIAGNOSIS — J9811 Atelectasis: Secondary | ICD-10-CM | POA: Diagnosis not present

## 2022-01-14 DIAGNOSIS — J439 Emphysema, unspecified: Secondary | ICD-10-CM | POA: Diagnosis not present

## 2022-01-14 DIAGNOSIS — R9389 Abnormal findings on diagnostic imaging of other specified body structures: Secondary | ICD-10-CM | POA: Insufficient documentation

## 2022-01-14 LAB — POCT I-STAT CREATININE: Creatinine, Ser: 1.3 mg/dL — ABNORMAL HIGH (ref 0.61–1.24)

## 2022-01-14 MED ORDER — IOHEXOL 300 MG/ML  SOLN
75.0000 mL | Freq: Once | INTRAMUSCULAR | Status: AC | PRN
  Administered 2022-01-14: 75 mL via INTRAVENOUS

## 2022-01-16 ENCOUNTER — Other Ambulatory Visit (HOSPITAL_COMMUNITY): Payer: Self-pay | Admitting: Internal Medicine

## 2022-01-16 DIAGNOSIS — E041 Nontoxic single thyroid nodule: Secondary | ICD-10-CM

## 2022-01-17 DIAGNOSIS — R9389 Abnormal findings on diagnostic imaging of other specified body structures: Secondary | ICD-10-CM | POA: Diagnosis not present

## 2022-01-17 DIAGNOSIS — N183 Chronic kidney disease, stage 3 unspecified: Secondary | ICD-10-CM | POA: Diagnosis not present

## 2022-01-17 DIAGNOSIS — Z6833 Body mass index (BMI) 33.0-33.9, adult: Secondary | ICD-10-CM | POA: Diagnosis not present

## 2022-01-17 DIAGNOSIS — E041 Nontoxic single thyroid nodule: Secondary | ICD-10-CM | POA: Diagnosis not present

## 2022-01-17 DIAGNOSIS — E1140 Type 2 diabetes mellitus with diabetic neuropathy, unspecified: Secondary | ICD-10-CM | POA: Diagnosis not present

## 2022-01-17 DIAGNOSIS — E6609 Other obesity due to excess calories: Secondary | ICD-10-CM | POA: Diagnosis not present

## 2022-01-17 DIAGNOSIS — I4891 Unspecified atrial fibrillation: Secondary | ICD-10-CM | POA: Diagnosis not present

## 2022-01-17 DIAGNOSIS — I130 Hypertensive heart and chronic kidney disease with heart failure and stage 1 through stage 4 chronic kidney disease, or unspecified chronic kidney disease: Secondary | ICD-10-CM | POA: Diagnosis not present

## 2022-01-17 DIAGNOSIS — G894 Chronic pain syndrome: Secondary | ICD-10-CM | POA: Diagnosis not present

## 2022-01-17 DIAGNOSIS — E1165 Type 2 diabetes mellitus with hyperglycemia: Secondary | ICD-10-CM | POA: Diagnosis not present

## 2022-01-21 ENCOUNTER — Ambulatory Visit (HOSPITAL_COMMUNITY)
Admission: RE | Admit: 2022-01-21 | Discharge: 2022-01-21 | Disposition: A | Discharge status: Home / Self Care | Payer: Medicare HMO | Source: Ambulatory Visit | Attending: Internal Medicine | Admitting: Internal Medicine

## 2022-01-21 DIAGNOSIS — E041 Nontoxic single thyroid nodule: Secondary | ICD-10-CM | POA: Diagnosis not present

## 2022-01-24 ENCOUNTER — Ambulatory Visit: Payer: Self-pay | Admitting: *Deleted

## 2022-01-24 ENCOUNTER — Encounter: Payer: Self-pay | Admitting: *Deleted

## 2022-01-24 NOTE — Patient Outreach (Signed)
  Care Coordination   Follow Up Visit Note   01/24/2022 Name: Xavier Caldwell MRN: [id number] DOB: 22-Mar-1950  Xavier Caldwell is a 71 y.o. year old male who sees Redmond School, MD for primary care. I spoke with  Xavier Caldwell by phone today.  What matters to the patients health and wellness today?  Find out more information on thyroid nodules and plan of care    Goals Addressed             This Visit's Progress    Increase Knowledge of Thryoid Nodules and Treatment Options       Care Coordination Interventions: Collaborated with Dr Nolon Rod office regarding referral to endocrinologist for thyroid nodules and any pertinent lab results Provided patient with verbal and written educational materials related to thyroid nodules and thyroid biopsies Reviewed scheduled/upcoming provider appointments including 03/04/22 with Xavier Pigg, NP (endocrinology) Discussed plans with patient for ongoing care management follow up and provided patient with direct contact information for care management team Advised patient to discuss biopsy and plan of care with provider Screening for signs and symptoms of depression related to chronic disease state  Assessed social determinant of health barriers Therapeutic listening utilized and provided appropriate reassurance regarding upcoming visit with endocrinology and likely an appt with ENT to have a thyroid biopsy done.  Printed thyroid ultrasound results to be mailed to patient with a letter from me and EMMI educational materials Encouraged patient to reach out to me as needed Scheduled f/u telephone call for 02/07/22        SDOH assessments and interventions completed:  No     Care Coordination Interventions:  Yes, provided   Follow up plan: Follow up call scheduled for 02/07/2022    Encounter Outcome:  Pt. Visit Completed   Xavier Caldwell, BSN, RN-BC RN Care Coordinator [place]: [phone] Main #:  [phone]

## 2022-02-03 DIAGNOSIS — I130 Hypertensive heart and chronic kidney disease with heart failure and stage 1 through stage 4 chronic kidney disease, or unspecified chronic kidney disease: Secondary | ICD-10-CM | POA: Diagnosis not present

## 2022-02-03 DIAGNOSIS — I5023 Acute on chronic systolic (congestive) heart failure: Secondary | ICD-10-CM | POA: Diagnosis not present

## 2022-02-03 DIAGNOSIS — E1122 Type 2 diabetes mellitus with diabetic chronic kidney disease: Secondary | ICD-10-CM | POA: Diagnosis not present

## 2022-02-07 ENCOUNTER — Ambulatory Visit: Payer: Self-pay | Admitting: *Deleted

## 2022-02-07 ENCOUNTER — Encounter: Payer: Self-pay | Admitting: *Deleted

## 2022-02-07 NOTE — Patient Outreach (Signed)
  Care Coordination   Follow Up Visit Note   02/07/2022 Name: ANTE ARREDONDO MRN: [id number] DOB: 1950-04-08  CARMICHAEL BURDETTE is a 72 y.o. year old male who sees Redmond School, MD for primary care. I spoke with  [name] by phone today.  What matters to the patients health and wellness today?  Plan of care or multinodular thyroid    Goals Addressed             This Visit's Progress    Increase Knowledge of Thryoid Nodules and Treatment Options       Care Coordination Interventions: Reviewed scheduled/upcoming provider appointments including 03/04/22 with Rayetta Pigg, NP (endocrinology) Discussed plans with patient for ongoing care management follow up and provided patient with direct contact information for care management team Advised patient to discuss biopsy and plan of care with provider Screening for signs and symptoms of depression related to chronic disease state  Assessed social determinant of health barriers Therapeutic listening utilized and provided appropriate reassurance regarding upcoming visit with endocrinology and likely an appt with ENT to have a thyroid biopsy done.  Verified that patient received the ultrasound results and EMMI educational material on nodular thyroids and biopsies. Discussed results and likely biopsy of one nodule. Advised that further [facility]corp and/or thyroid scans may be needed. Reached out to [facility] End[facility]ology clinical staff to verify that the appointment is for multinodular thyroid and not just DM management, as was in the appt notes. I added thyroid nodules to the appt notes. Asked if they could get him in any sooner or call to give him info on what to expect since he's a little anxious about the potential plan of care and treatment Encouraged patient to reach out to me as needed Scheduled f/u telephone call for 02/21/22        SDOH assessments and interventions completed:  Yes  SDOH Interventions Today    Flowsheet Row Most  Recent Value  SDOH Interventions   Housing Interventions Intervention Not Indicated  Transportation Interventions Intervention Not Indicated  Utilities Interventions Intervention Not Indicated  Financial Strain Interventions Intervention Not Indicated        Care Coordination Interventions:  Yes, provided   Follow up plan: Follow up call scheduled for 02/21/22    Encounter Outcome:  Pt. Visit Completed   Chong Sicilian, BSN, RN-BC RN Care Coordinator [place]: [phone] Main #: [phone]

## 2022-02-08 DIAGNOSIS — R69 Illness, unspecified: Secondary | ICD-10-CM | POA: Diagnosis not present

## 2022-02-08 DIAGNOSIS — N529 Male erectile dysfunction, unspecified: Secondary | ICD-10-CM | POA: Diagnosis not present

## 2022-02-08 DIAGNOSIS — E785 Hyperlipidemia, unspecified: Secondary | ICD-10-CM | POA: Diagnosis not present

## 2022-02-08 DIAGNOSIS — I10 Essential (primary) hypertension: Secondary | ICD-10-CM | POA: Diagnosis not present

## 2022-02-08 DIAGNOSIS — I2510 Atherosclerotic heart disease of native coronary artery without angina pectoris: Secondary | ICD-10-CM | POA: Diagnosis not present

## 2022-02-08 DIAGNOSIS — E669 Obesity, unspecified: Secondary | ICD-10-CM | POA: Diagnosis not present

## 2022-02-08 DIAGNOSIS — H409 Unspecified glaucoma: Secondary | ICD-10-CM | POA: Diagnosis not present

## 2022-02-08 DIAGNOSIS — E1142 Type 2 diabetes mellitus with diabetic polyneuropathy: Secondary | ICD-10-CM | POA: Diagnosis not present

## 2022-02-08 DIAGNOSIS — I4891 Unspecified atrial fibrillation: Secondary | ICD-10-CM | POA: Diagnosis not present

## 2022-02-08 DIAGNOSIS — G2581 Restless legs syndrome: Secondary | ICD-10-CM | POA: Diagnosis not present

## 2022-02-08 DIAGNOSIS — F419 Anxiety disorder, unspecified: Secondary | ICD-10-CM | POA: Diagnosis not present

## 2022-02-08 DIAGNOSIS — Z79891 Long term (current) use of opiate analgesic: Secondary | ICD-10-CM | POA: Diagnosis not present

## 2022-02-08 DIAGNOSIS — I252 Old myocardial infarction: Secondary | ICD-10-CM | POA: Diagnosis not present

## 2022-02-08 DIAGNOSIS — Z008 Encounter for other general examination: Secondary | ICD-10-CM | POA: Diagnosis not present

## 2022-02-21 ENCOUNTER — Ambulatory Visit: Payer: Self-pay | Admitting: *Deleted

## 2022-02-21 ENCOUNTER — Encounter: Payer: Self-pay | Admitting: *Deleted

## 2022-02-21 NOTE — Patient Outreach (Signed)
  Care Coordination   Follow Up Visit Note   02/21/2022 Name: Xavier Caldwell MRN: [id number] DOB: 1950/04/30  Xavier Caldwell is a 72 y.o. year old male who sees Xavier School, MD for primary care. I spoke with  Xavier Caldwell by phone today.  What matters to the patients health and wellness today?  Managing multinodular thyroid    Goals Addressed             This Visit's Progress    Increase Knowledge of Thryoid Nodules and Treatment Options       Care Coordination Interventions: Reviewed scheduled/upcoming provider appointments including 03/04/22 with Xavier Pigg, NP (endocrinology) Discussed plans with patient for ongoing care management follow up and provided patient with direct contact information for care management team Advised patient to discuss biopsy and plan of care with provider Screening for signs and symptoms of depression related to chronic disease state  Assessed social determinant of health barriers Therapeutic listening utilized and provided appropriate reassurance regarding upcoming visit with endocrinology and likely an appt with ENT to have a thyroid biopsy done.  Previously reached out to [facility] Endcocrinology clinical staff to verify that the appointment is for multinodular thyroid and not just DM management, as was in the appt notes. Appt notes were adjusted by endo to reflect that the appt is for multinodular thyroid. They were unable to schedule his appt any sooner. It is set for 03/04/22.  Encouraged patient to reach out to me as needed Scheduled f/u telephone call for 03/06/22        SDOH assessments and interventions completed:  Yes  SDOH Interventions Today    Flowsheet Row Most Recent Value  SDOH Interventions   Transportation Interventions Intervention Not Indicated  Financial Strain Interventions Intervention Not Indicated        Care Coordination Interventions:  Yes, provided   Follow up plan: Follow up call scheduled for 03/06/22     Encounter Outcome:  Pt. Visit Completed   Xavier Caldwell, BSN, RN-BC RN Care Coordinator [place] Direct Dial: [phone] Main #: [phone]

## 2022-02-28 ENCOUNTER — Other Ambulatory Visit: Payer: Self-pay | Admitting: Cardiology

## 2022-02-28 ENCOUNTER — Other Ambulatory Visit (HOSPITAL_COMMUNITY): Payer: Self-pay

## 2022-02-28 NOTE — Patient Instructions (Signed)
Thyroid Needle Biopsy  Thyroid needle biopsy is a procedure to remove small samples of tissue or fluid from the thyroid gland. The samples are then examined under a microscope. The thyroid is a gland in the lower front area of the neck. It produces hormones that affect many important body processes, including growth and development, body temperature, and how the body uses food for energy (metabolism). This procedure is often done to help diagnose cancer, infection, or other problems with the thyroid. During this procedure, a thin needle (fine needle) is inserted through the skin and into the thyroid gland. This is less invasive than a procedure in which an incision is made over the thyroid (open thyroid biopsy). Sometimes, an open thyroid biopsy may be done during a different surgery, such as surgery to remove a part or a whole section (lobe) of the thyroid gland (open lobectomy). Tell a health care provider about: Any allergies you have. All medicines you are taking, including vitamins, herbs, eye drops, creams, and over-the-counter medicines. Any problems you or family members have had with anesthetic medicines. Any blood disorders you have. Any surgeries you have had. Any medical conditions you have. Whether you are pregnant or may be pregnant. What are the risks? Generally, this is a safe procedure. However, problems may occur, including: Infection. Bleeding. Allergic reactions to medicines. Damage to nerves or blood vessels in the neck. What happens before the procedure? Medicines Ask your health care provider about: Changing or stopping your regular medicines. This is especially important if you are taking diabetes medicines or blood thinners. Taking medicines such as aspirin and ibuprofen. These medicines can thin your blood. Do not take these medicines unless your health care provider tells you to take them. Taking over-the-counter medicines, vitamins, herbs, and supplements. General  instructions You may have blood tests. You may have an ultrasound before or during the needle biopsy. What happens during the procedure? You will be asked to lie on your back with your head tipped backward to extend your neck. You may be asked to avoid coughing, talking, swallowing, or making sounds during some parts of the procedure. To lower your risk of infection: Your health care team will wash or sanitize their hands. The skin over your thyroid will be cleaned with a germ-killing (antiseptic) solution. A local anesthetic (lidocaine) may be injected into the skin over your thyroid, to numb the area. An ultrasound may be done to help guide the needle to the desired area of your thyroid. A fine needle will be inserted into your thyroid. The needle will be used to remove tissue or fluid samples as needed. The samples will be sent to a lab for examination. The needle will be removed. Pressure may be applied to your neck to reduce swelling and stop bleeding. The procedure may vary among health care providers and hospitals. What happens after the procedure? It is up to you to get the results of your procedure. Ask your health care provider, or the department that is doing the procedure, when your results will be ready. Summary Thyroid needle biopsy is a procedure to remove small samples of tissue or fluid from the thyroid gland. During this procedure, a thin needle (fine needle) is inserted through the skin and into the thyroid gland. This is less invasive than a procedure in which an incision is made over the thyroid (open thyroid biopsy). You will be asked to lie on your back with your head tipped backward to extend your neck. You may be  asked to avoid coughing, talking, swallowing, or making sounds during some parts of the procedure. This information is not intended to replace advice given to you by your health care provider. Make sure you discuss any questions you have with your health care  provider. Document Revised: 05/24/2021 Document Reviewed: 09/30/2019 Elsevier Patient Education  [place].

## 2022-03-04 ENCOUNTER — Ambulatory Visit: Payer: Medicare HMO | Admitting: Nurse Practitioner

## 2022-03-04 ENCOUNTER — Encounter: Payer: Self-pay | Admitting: Nurse Practitioner

## 2022-03-04 VITALS — BP 144/90 | HR 76 | Ht 71.0 in | Wt 236.4 lb

## 2022-03-04 DIAGNOSIS — E041 Nontoxic single thyroid nodule: Secondary | ICD-10-CM | POA: Diagnosis not present

## 2022-03-04 DIAGNOSIS — E042 Nontoxic multinodular goiter: Secondary | ICD-10-CM

## 2022-03-04 NOTE — Progress Notes (Signed)
Endocrinology Consult Note 03/04/22    ---------------------------------------------------------------------------------------------------------------------- Subjective    Past Medical History:  Diagnosis Date   Acute systolic (congestive) heart failure (HCC)    Anxiety    Arthritis    Atrial fibrillation (HCC)    Back pain    Chronic kidney disease    pt told by PA of Dr. [name] that he had "stage 3 kidney so they had me stop taking Metformin"   Coronary artery disease    Diabetes mellitus without complication (HCC)    High cholesterol    History of gout    Hypertension    Myocardial infarction ([place]) 04/2018   Neuropathy    PAD (peripheral artery disease) (HCC)    Retinal detachment    One on the right and two on the left   Sleep apnea    Stop Bang score of 4   STEMI (ST elevation myocardial infarction) ([place])    04/08/2018 PCI/DES RCA   Stroke [facility])    February 2022   VT (ventricular tachycardia) [facility])     Past Surgical History:  Procedure Laterality Date   APPENDECTOMY     CARDIOVERSION N/A 04/13/2018   Procedure: CARDIOVERSION;  Surgeon: Skeet Latch, MD;  Location: [place];  Service: Cardiovascular;  Laterality: N/A;   CATARACT EXTRACTION W/PHACO Right 04/25/2014   Procedure: CATARACT EXTRACTION PHACO AND INTRAOCULAR LENS PLACEMENT ([place]);  Surgeon: Williams Che, MD;  Location: [facility];  Service: Ophthalmology;  Laterality: Right;  CDE:4.70   CATARACT EXTRACTION W/PHACO Left 07/11/2014   Procedure: CATARACT EXTRACTION PHACO AND INTRAOCULAR LENS PLACEMENT LEFT EYE CDE=8.32;  Surgeon: Williams Che, MD;  Location: [facility];  Service: Ophthalmology;  Laterality: Left;   COLONOSCOPY WITH PROPOFOL N/A 09/23/2016   [place]: 9 polyps removed, multiple tubular adenomas. recommended 3 yr surveillance   COLONOSCOPY WITH PROPOFOL N/A 12/27/2019   ten sessile polyps, 4-9 mm in size in descending, hepatic flexure, and cecum, 11 mm polyp in sigmoid, three sessile polyps in  rectum and mid rectum 2-3 mm in size. Tubular adenomas and hyperplastic. 3 year surveillance   CORONARY/GRAFT ACUTE MI REVASCULARIZATION N/A 04/07/2018   Procedure: Coronary/Graft Acute MI Revascularization;  Surgeon: Burnell Blanks, MD;  Location: [place] CV LAB;  Service: Cardiovascular;  Laterality: N/A;   ENDARTERECTOMY Right 03/29/2020   Procedure: RIGHT CAROTID ARTERY ENDARTERECTOMY WITH PATCH ANGIOPLASTY;  Surgeon: Marty Heck, MD;  Location: [place];  Service: Vascular;  Laterality: Right;   ENDARTERECTOMY Left 09/11/2020   Procedure: ENDARTERECTOMY CAROTID LEFT;  Surgeon: Marty Heck, MD;  Location: [place];  Service: Vascular;  Laterality: Left;   LEFT HEART CATH AND CORONARY ANGIOGRAPHY N/A 04/07/2018   Procedure: LEFT HEART CATH AND CORONARY ANGIOGRAPHY;  Surgeon: Burnell Blanks, MD;  Location: [place] CV LAB;  Service: Cardiovascular;  Laterality: N/A;   MELANOMA EXCISION  07/2019   PATCH ANGIOPLASTY Left 09/11/2020   Procedure: PATCH ANGIOPLASTY;  Surgeon: Marty Heck, MD;  Location: [place];  Service: Vascular;  Laterality: Left;   POLYPECTOMY  09/23/2016   Procedure: POLYPECTOMY;  Surgeon: Daneil Dolin, MD;  [facility];  Service: Endoscopy;;  colon   POLYPECTOMY  12/27/2019   Procedure: POLYPECTOMY;  Surgeon: Daneil Dolin, MD;  [facility];  Service: Endoscopy;;   RETINAL DETACHMENT SURGERY      Social History   Socioeconomic History   Marital status: Married    Spouse name: Not on file   Number of children:  Not on file   Years of education: Not on file   Highest education level: Not on file  Occupational History   Not on file  Tobacco Use   Smoking status: Former    Packs/day: 1.50    Years: 30.00    Total pack years: 45.00    Types: Cigarettes    Quit date: 07/05/2012    Years since quitting: 9.6    Passive exposure: Never   Smokeless tobacco: Never  Vaping Use   Vaping Use: Never used   Substance and Sexual Activity   Alcohol use: No   Drug use: No   Sexual activity: Not Currently    Birth control/protection: None  Other Topics Concern   Not on file  Social History Narrative   Not on file   Social Determinants of Health   Financial Resource Strain: Low Risk  (02/21/2022)   Overall Financial Resource Strain (CARDIA)    Difficulty of Paying Living Expenses: Not hard at all  Food Insecurity: Not on file  Transportation Needs: No Transportation Needs (02/21/2022)   PRAPARE - Hydrologist (Medical): No    Lack of Transportation (Non-Medical): No  Physical Activity: Not on file  Stress: Not on file  Social Connections: Socially Integrated (12/07/2021)   Social Connection and Isolation Panel [NHANES]    Frequency of Communication with Friends and Family: More than three times a week    Frequency of Social Gatherings with Friends and Family: More than three times a week    Attends Religious Services: More than 4 times per year    Active Member of [employer] or Organizations: Yes    Attends Music therapist: More than 4 times per year    Marital Status: Married  Human resources officer Violence: Not on file    Current Outpatient Medications on File Prior to Visit  Medication Sig Dispense Refill   ALPRAZolam (XANAX) 0.5 MG tablet Take 0.5 mg by mouth 2 (two) times daily as needed for anxiety.     amLODipine (NORVASC) 10 MG tablet Take 1 tablet (10 mg total) by mouth daily. 30 tablet 1   apixaban (ELIQUIS) 5 MG TABS tablet Take 1 tablet (5 mg total) by mouth 2 (two) times daily. Restart medication tomorrow, 09/12/2020 60 tablet 6   atorvastatin (LIPITOR) 80 MG tablet TAKE ONE TABLET BY MOUTH EVERYDAY AT BEDTIME 90 tablet 2   carvedilol (COREG) 12.5 MG tablet TAKE ONE TABLET BY MOUTH TWICE DAILY 180 tablet 1   DULoxetine (CYMBALTA) 60 MG capsule Take 60 mg by mouth daily with lunch.      ergocalciferol (VITAMIN D2) 50000 units capsule Take  50,000 Units by mouth every Sunday.      ezetimibe (ZETIA) 10 MG tablet Take 1 tablet (10 mg total) by mouth daily. 30 tablet 5   gabapentin (NEURONTIN) 300 MG capsule Take 300 mg by mouth 2 (two) times daily.      glimepiride (AMARYL) 4 MG tablet Take 4 mg by mouth daily.     glucose blood (ONETOUCH ULTRA) test strip USE ONE TEST STRIP AS DIRECTED TWICE DAILY 100 each 0   hydrALAZINE (APRESOLINE) 50 MG tablet Take 1 tablet (50 mg total) by mouth 3 (three) times daily. 270 tablet 3   HYDROcodone-acetaminophen (NORCO/VICODIN) 5-325 MG tablet Take 1 tablet by mouth every 4 (four) hours as needed.     insulin NPH Human (NOVOLIN N RELION) 100 UNIT/ML injection Inject 0.5 mLs (50 Units total) into the  skin every morning. And syringes 1/day 20 mL 11   mupirocin ointment (BACTROBAN) 2 % Apply 1 application topically daily as needed (melonoma).     nitroGLYCERIN (NITROSTAT) 0.4 MG SL tablet Place 1 tablet (0.4 mg total) under the tongue every 5 (five) minutes as needed. (Patient taking differently: Place 0.4 mg under the tongue every 5 (five) minutes as needed for chest pain.) 25 tablet 2   rOPINIRole (REQUIP) 1 MG tablet Take 1 mg by mouth 4 (four) times daily.      timolol (TIMOPTIC) 0.5 % ophthalmic solution INSTILL 1 DROP INTO EACH EYE TWICE DAILY (Patient taking differently: Place 1 drop into both eyes 2 (two) times daily.) 10 mL 10   UNIFINE PENTIPS 31G X 5 MM MISC USE TO INJECT INSULIN AT BEDTIME (Patient taking differently: at bedtime.) 100 each 5   oxyCODONE-acetaminophen (PERCOCET/ROXICET) 5-325 MG tablet Take 1-2 tablets by mouth every 4 (four) hours as needed for moderate pain. (Patient not taking: Reported on 07/03/2021) 10 tablet 0   No current facility-administered medications on file prior to visit.      HPI   Xavier Caldwell is a 73 y.o.-year-old male, referred by his PCP, Dr. Gerarda Fraction, for evaluation for multinodular goiter.  This was incidentally found on CT chest following up on possible  lung nodule.  He was then sent for thyroid ultrasound to assess in depth.   Thyroid U/S: 01/21/22 CLINICAL DATA:  Incidental on CT.   EXAM: THYROID ULTRASOUND   TECHNIQUE: Ultrasound examination of the thyroid gland and adjacent soft tissues was performed.   COMPARISON:  CT chest January 14, 2022   FINDINGS: Parenchymal Echotexture: Moderately heterogenous   Isthmus: 1.1 cm   Right lobe: 7.8 x 3.1 x 3.0 cm   Left lobe: 6.6 x 3.2 x 2.8 cm   _________________________________________________________   Estimated total number of nodules >/= 1 cm:   Number of spongiform nodules >/=  2 cm not described below (TR1): 0   Number of mixed cystic and solid nodules >/= 1.5 cm not described below (TR2): 0   _________________________________________________________   Nodule labeled 4 appears to be a mixed cystic and solid isoechoic TR 2 nodule in the thyroid isthmus measuring 1.5 cm. This nodule does NOT meet TI-RADS criteria for biopsy or dedicated follow-up.   Nodule labeled 1 is a mixed cystic and solid isoechoic TR 2 nodule in the superior right thyroid lobe measuring 1.7 cm. This nodule does NOT meet TI-RADS criteria for biopsy or dedicated follow-up.   Nodule labeled 2 is a mixed cystic and solid isoechoic TR 2 nodule in the mid right thyroid lobe measuring 1.1 cm. This nodule does NOT meet TI-RADS criteria for biopsy or dedicated follow-up.   Nodule labeled 3 appears to be best described as a solid isoechoic TR 3 nodule in the inferior right thyroid lobe measuring 3.7 x 3.2 x 3.0 cm. **Given size (>/= 2.5 cm) and appearance, fine needle aspiration of this mildly suspicious nodule should be considered based on TI-RADS criteria.   Nodule labeled 5 is a mixed cystic and solid isoechoic TR 2 nodule in the superior left thyroid lobe measuring 1.5 cm. This nodule does NOT meet TI-RADS criteria for biopsy or dedicated follow-up.   Nodule labeled 6 is a mixed cystic and solid  isoechoic TR 2 nodule in the posterior aspect of the mid left thyroid lobe measuring 1.5 cm. This nodule does NOT meet TI-RADS criteria for biopsy or dedicated follow-up.   IMPRESSION: 1.  Enlarged multinodular thyroid gland. 2. Nodule labeled 3 in the inferior right thyroid lobe (3.7 cm TR 3) meets criteria for biopsy. 3. A number of additional benign-appearing nodules as described do not meet criteria for further dedicated follow-up or biopsy.   The above is in keeping with the ACR TI-RADS recommendations - J Am Coll Radiol 2017;14:587-595.     Electronically Signed   By: Albin Felling M.D.   On: 01/22/2022 14:02   I reviewed pt's thyroid tests: Lab Results  Component Value Date   TSH 0.17 (L) 09/21/2020   TSH 0.166 (L) 02/29/2020   TSH 0.668 04/17/2018   TSH 0.547 04/11/2018   TSH 0.314 (L) 01/07/2017   TSH 0.410 (L) 01/01/2016   FREET4 0.82 09/21/2020   FREET4 1.20 02/29/2020   FREET4 1.13 01/07/2017   FREET4 1.05 01/01/2016     Pt c/o: - decreased sex drive - difficulty breathing when laying down (sleeps in recliner)  Pt denies - feeling nodules in neck - hoarseness - dysphagia - choking   No FH of thyroid ds. No FH of thyroid cancer. No h/o radiation tx to head or neck.  No seaweed or kelp. No recent contrast studies. No steroid use. No herbal supplements. No Biotin supplements or Hair, Skin and Nails vitamins.  Pt also has a history of DM, HLD, PVD, Afib, CAD  with MI, HTN.  Review of systems  Constitutional: + steadily increasing body weight,  current Body mass index is 32.97 kg/m. , no fatigue, no subjective hyperthermia, no subjective hypothermia, decreased sex drive Eyes: no blurry vision, no xerophthalmia ENT: no sore throat, no nodules palpated in throat, no dysphagia/odynophagia, no hoarseness Cardiovascular: no chest pain, no shortness of breath, no palpitations, no leg swelling Respiratory: no cough, no shortness of breath, + difficulty  breathing when laying down at night (sleeps in recliner) Gastrointestinal: no nausea/vomiting/diarrhea Musculoskeletal: no muscle/joint aches Skin: no rashes, no hyperemia Neurological: no tremors, no numbness, no tingling, no dizziness Psychiatric: no depression, no anxiety  ---------------------------------------------------------------------------------------------------------------------- Objective    BP (!) 144/90 (BP Location: Right Arm, Patient Position: Sitting, Cuff Size: Large) Comment: Manuel cuff  Pulse 76   Ht '5\' 11"'$  (1.803 m)   Wt 236 lb 6.4 oz (107.2 kg)   BMI 32.97 kg/m    BP Readings from Last 3 Encounters:  03/04/22 (!) 144/90  11/20/21 138/78  07/03/21 (!) 190/88    Wt Readings from Last 3 Encounters:  03/04/22 236 lb 6.4 oz (107.2 kg)  11/20/21 233 lb (105.7 kg)  07/03/21 232 lb (105.2 kg)    Physical Exam- Limited  Constitutional:  Body mass index is 32.97 kg/m. , not in acute distress, normal state of mind Eyes:  EOMI, no exophthalmos Neck: Supple Thyroid: No gross goiter, no palpable nodularity- does have scar tissue to neck from previous endarterectomy Cardiovascular: RRR, no murmurs, rubs, or gallops, no edema Respiratory: Adequate breathing efforts, no crackles, rales, rhonchi, or wheezing Musculoskeletal: no gross deformities, strength intact in all four extremities, no gross restriction of joint movements Skin:  no rashes, no hyperemia Neurological: no tremor with outstretched hands      ----------------------------------------------------------------------------------------------------------------------  ASSESSMENT / PLAN:  1. Thyroid Nodule  - I reviewed the images of his thyroid ultrasound along with the patient. I pointed out that the dominant nodule is large, this being a risk factor for cancer.  Otherwise, the nodules are: - not hypoechoic - without microcalcifications - without internal blood flow - more wide than tall -  well  delimited from surrounding tissue  Pt does not have a thyroid cancer family history or a personal history of RxTx to head/neck. All these would favor benignity.  - the only way that we can tell exactly if it is cancer or not is by doing a thyroid biopsy (FNA). I explained what the test entails. - We discussed about other options, to wait for another 6 months to a year and see if the nodule grows, and only intervene at that time.   - patient decided to have the FNA done now >> I ordered this.   I will also recheck a more comprehensive thyroid panel to assess for any thyroid dysfunction, including antibody testing to help assess risk for developing chronic thyroid condition.    FOLLOW UP PLAN: Return in about 1 month (around 04/04/2022) for Thyroid follow up, FNA, Previsit labs.     I spent 40 minutes in the care of the patient today including review of labs from Thyroid Function, CMP, and other relevant labs ; imaging/biopsy records (current and previous including abstractions from other facilities); face-to-face time discussing  his lab results and symptoms, medications doses, his options of short and long term treatment based on the latest standards of care / guidelines;   and documenting the encounter.  [name]  participated in the discussions, expressed understanding, and voiced agreement with the above plans.  All questions were answered to his satisfaction. he is encouraged to contact clinic should he have any questions or concerns prior to his return visit.    Rayetta Pigg, [facility] [address] [place], [place] [address] Phone: [phone] Fax: [phone]

## 2022-03-06 ENCOUNTER — Encounter: Payer: Self-pay | Admitting: *Deleted

## 2022-03-06 ENCOUNTER — Ambulatory Visit: Payer: Self-pay | Admitting: *Deleted

## 2022-03-06 NOTE — Patient Outreach (Signed)
  Care Coordination   Follow Up Visit Note   03/06/2022 Name: Xavier Caldwell MRN: [id number] DOB: 1950/07/17  Xavier Caldwell is a 72 y.o. year old male who sees Redmond School, MD for primary care. I spoke with  [name] by phone today.  What matters to the patients health and wellness today?  Having thyroid biopsy and finding out results and plan of care    Goals Addressed             This Visit's Progress    Increase Knowledge of Thryoid Nodules and Treatment Options       Care Coordination Interventions: Discussed plans with patient for ongoing care management follow up and provided patient with direct contact information for care management team Screening for signs and symptoms of depression related to chronic disease state  Reviewed and discussed recent endocrinology visit Reviewed and discussed scheduled thyroid biopsy at [facility] on 03/13/22. Reviewed appointment instructions. Patient stated understanding.  Therapeutic listening utilized regarding health related anxiety Reviewed appt with endocrinology on 04/04/22 to go over results and plan of care Scheduled follow-up appt with me on 03/21/22 Encouraged patient to reach out to me sooner if needed     COMPLETED: Sleep Disturbance       Care Coordination Interventions: Collaborated with Dr Gerarda Fraction regarding potential need for sleep study due to patient waking up during the night SOB. This scares him. It resolves once he sits up. He can sleep in a recliner with no difficulty, but has trouble when lying flat. He has never had a sleep study. Wife reports snoring but hasn't noticed any pauses in breathing or gasping for air. Does not have SOB with exertion or during normal activities. Could be positional airway obstruction or sleep apnea. Cardiac cause less likely since he does not have SOB at other times.  Discussed plans with patient for ongoing care management follow up and provided patient with direct contact information for care  management team Advised patient to discuss any new or worsening symptoms with provider Provided with [facility] contact number and encouraged to reach out as needed  Being addressed as part of his thyroid condition. Closing goal for now.         SDOH assessments and interventions completed:  No     Care Coordination Interventions:  Yes, provided   Follow up plan: Follow up call scheduled for 03/21/22    Encounter Outcome:  Pt. Visit Completed   Chong Sicilian, BSN, RN-BC RN Care Coordinator [place]: [phone] Main #: [phone]

## 2022-03-07 ENCOUNTER — Encounter: Payer: Self-pay | Admitting: Nurse Practitioner

## 2022-03-07 LAB — T4, FREE: Free T4: 0.99 ng/dL (ref 0.82–1.77)

## 2022-03-07 LAB — THYROID PEROXIDASE ANTIBODY: Thyroperoxidase Ab SerPl-aCnc: 10 IU/mL (ref 0–34)

## 2022-03-07 LAB — TSH: TSH: 0.434 u[IU]/mL — ABNORMAL LOW (ref 0.450–4.500)

## 2022-03-07 LAB — T3, FREE: T3, Free: 3.2 pg/mL (ref 2.0–4.4)

## 2022-03-07 LAB — THYROGLOBULIN ANTIBODY: Thyroglobulin Antibody: <1 IU/mL (ref 0.0–0.9)

## 2022-03-07 NOTE — Progress Notes (Signed)
Noted  

## 2022-03-07 NOTE — Progress Notes (Signed)
FYI: I sent patient message going over thyroid labs

## 2022-03-11 ENCOUNTER — Encounter: Payer: Self-pay | Admitting: General Practice

## 2022-03-11 NOTE — Progress Notes (Signed)
Xavier Lenis, MD  [name], NT Ok to hold eliquis as needed  [name] MD       Previous Messages    ----- Message ----- From: [name], [place] Sent: 03/04/2022  10:51 AM EST To: Xavier Lenis, MD Subject: Thyroid Biopsy                                Good Morning The above pt is having a thyroid biopsy on 03/13/22 and will need to hold his Eliquis for 48 hrs prior. Can we have your permission to hold this medication for the procedure?  Thanks [employer]

## 2022-03-13 ENCOUNTER — Encounter (HOSPITAL_COMMUNITY): Payer: Self-pay

## 2022-03-13 ENCOUNTER — Ambulatory Visit (HOSPITAL_COMMUNITY)
Admission: RE | Admit: 2022-03-13 | Discharge: 2022-03-13 | Disposition: A | Discharge status: Home / Self Care | Payer: Medicare HMO | Source: Ambulatory Visit | Attending: Nurse Practitioner | Admitting: Nurse Practitioner

## 2022-03-13 VITALS — BP 145/77 | HR 76 | Temp 98.1°F | Resp 18

## 2022-03-13 DIAGNOSIS — E041 Nontoxic single thyroid nodule: Secondary | ICD-10-CM | POA: Diagnosis not present

## 2022-03-13 DIAGNOSIS — E042 Nontoxic multinodular goiter: Secondary | ICD-10-CM | POA: Insufficient documentation

## 2022-03-13 DIAGNOSIS — E069 Thyroiditis, unspecified: Secondary | ICD-10-CM | POA: Diagnosis not present

## 2022-03-13 MED ORDER — LIDOCAINE HCL (PF) 2 % IJ SOLN
INTRAMUSCULAR | Status: AC
  Administered 2022-03-13: 10 mL
  Filled 2022-03-13: qty 10

## 2022-03-13 NOTE — Progress Notes (Signed)
PT tolerated thyroid biopsy procedure well today. Labs and afirma obtained and sent for pathology. PT ambulatory at discharge with no acute distress noted and verbalized understanding of discharge instructions. 

## 2022-03-14 LAB — CYTOLOGY - NON PAP

## 2022-03-19 ENCOUNTER — Telehealth: Payer: Self-pay | Admitting: *Deleted

## 2022-03-19 NOTE — Telephone Encounter (Signed)
Patient was called and given his results - He will need to have a repeat as not enough specimen was obtained. Will allow for the patient to have time to heal and he and Loree Fee will be discussing at his upcoming office visit to reschedule biopsy.

## 2022-03-20 ENCOUNTER — Encounter: Payer: Self-pay | Admitting: *Deleted

## 2022-03-20 ENCOUNTER — Ambulatory Visit: Payer: Self-pay | Admitting: *Deleted

## 2022-03-20 NOTE — Patient Outreach (Signed)
  Care Coordination   Follow Up Visit Note   03/20/2022 Name: BRODEY BONN MRN: [id number] DOB: 1950/03/28  KHIEM GARGIS is a 72 y.o. year old male who sees Redmond School, MD for primary care. I spoke with  [name] by phone today.  What matters to the patients health and wellness today?  Thyroid biopsy results    Goals Addressed             This Visit's Progress    Increase Knowledge of Thryoid Nodules and Treatment Options       Care Coordination Interventions: Evaluation of current treatment plan related to thyroid nodules and patient's adherence to plan as established by provider Advised patient to talk with endocrinologist about rescheduling 2nd thyroid biopsy due to insufficient cells for satisfactory testing Reviewed scheduled/upcoming provider appointments including 04/04/22 with Rayetta Pigg, PA-C (endocrinology) Discussed plans with patient for ongoing care management follow up and provided patient with direct contact information for care management team Reviewed recent thyroid lab results and provider's interpretation and discussed with patient. My chart message was sent to patient with this information but he was unable to access it Discussed that thyroid biopsy results showed inflammation but that they didn't have enough cells for a satisfactory exam. Patient is healing well with no complaints related to the biopsy at this time.  Scheduled for telephone follow-up on 04/05/22 Encouraged patient to reach out to me sooner if needed        SDOH assessments and interventions completed:  No    Care Coordination Interventions:  Yes, provided  TOC Interventions Today    Flowsheet Row Most Recent Value  TOC Interventions   TOC Interventions Discussed/Reviewed TOC Interventions Discussed, TOC Interventions Reviewed      Follow up plan: Follow up call scheduled for 04/05/22    Encounter Outcome:  Pt. Visit Completed

## 2022-03-21 ENCOUNTER — Encounter: Payer: Self-pay | Admitting: *Deleted

## 2022-03-26 DIAGNOSIS — Z1283 Encounter for screening for malignant neoplasm of skin: Secondary | ICD-10-CM | POA: Diagnosis not present

## 2022-03-26 DIAGNOSIS — Z08 Encounter for follow-up examination after completed treatment for malignant neoplasm: Secondary | ICD-10-CM | POA: Diagnosis not present

## 2022-03-26 DIAGNOSIS — Z85820 Personal history of malignant melanoma of skin: Secondary | ICD-10-CM | POA: Diagnosis not present

## 2022-03-26 DIAGNOSIS — D225 Melanocytic nevi of trunk: Secondary | ICD-10-CM | POA: Diagnosis not present

## 2022-04-04 ENCOUNTER — Encounter: Payer: Self-pay | Admitting: Nurse Practitioner

## 2022-04-04 ENCOUNTER — Ambulatory Visit: Payer: Medicare HMO | Admitting: Nurse Practitioner

## 2022-04-04 VITALS — BP 130/70 | HR 72 | Ht 71.0 in | Wt 229.4 lb

## 2022-04-04 DIAGNOSIS — E042 Nontoxic multinodular goiter: Secondary | ICD-10-CM | POA: Diagnosis not present

## 2022-04-04 DIAGNOSIS — R7989 Other specified abnormal findings of blood chemistry: Secondary | ICD-10-CM

## 2022-04-04 NOTE — Progress Notes (Signed)
Endocrinology Follow Up Note 04/04/22    ---------------------------------------------------------------------------------------------------------------------- Subjective    Past Medical History:  Diagnosis Date   Acute systolic (congestive) heart failure (HCC)    Anxiety    Arthritis    Atrial fibrillation (HCC)    Back pain    Chronic kidney disease    pt told by PA of Dr. [name] that he had "stage 3 kidney so they had me stop taking Metformin"   Coronary artery disease    Diabetes mellitus without complication (HCC)    High cholesterol    History of gout    Hypertension    Myocardial infarction ([place]) 04/2018   Neuropathy    PAD (peripheral artery disease) (HCC)    Retinal detachment    One on the right and two on the left   Sleep apnea    Stop Bang score of 4   STEMI (ST elevation myocardial infarction) ([place])    04/08/2018 PCI/DES RCA   Stroke [facility])    February 2022   VT (ventricular tachycardia) [facility])     Past Surgical History:  Procedure Laterality Date   APPENDECTOMY     CARDIOVERSION N/A 04/13/2018   Procedure: CARDIOVERSION;  Surgeon: Skeet Latch, MD;  Location: [place];  Service: Cardiovascular;  Laterality: N/A;   CATARACT EXTRACTION W/PHACO Right 04/25/2014   Procedure: CATARACT EXTRACTION PHACO AND INTRAOCULAR LENS PLACEMENT ([place]);  Surgeon: Williams Che, MD;  Location: [facility];  Service: Ophthalmology;  Laterality: Right;  CDE:4.70   CATARACT EXTRACTION W/PHACO Left 07/11/2014   Procedure: CATARACT EXTRACTION PHACO AND INTRAOCULAR LENS PLACEMENT LEFT EYE CDE=8.32;  Surgeon: Williams Che, MD;  Location: [facility];  Service: Ophthalmology;  Laterality: Left;   COLONOSCOPY WITH PROPOFOL N/A 09/23/2016   [place]: 9 polyps removed, multiple tubular adenomas. recommended 3 yr surveillance   COLONOSCOPY WITH PROPOFOL N/A 12/27/2019   ten sessile polyps, 4-9 mm in size in descending, hepatic flexure, and cecum, 11 mm polyp in sigmoid, three sessile polyps in  rectum and mid rectum 2-3 mm in size. Tubular adenomas and hyperplastic. 3 year surveillance   CORONARY/GRAFT ACUTE MI REVASCULARIZATION N/A 04/07/2018   Procedure: Coronary/Graft Acute MI Revascularization;  Surgeon: Burnell Blanks, MD;  Location: [place] CV [facility];  Service: Cardiovascular;  Laterality: N/A;   ENDARTERECTOMY Right 03/29/2020   Procedure: RIGHT CAROTID ARTERY ENDARTERECTOMY WITH PATCH ANGIOPLASTY;  Surgeon: Marty Heck, MD;  Location: [place];  Service: Vascular;  Laterality: Right;   ENDARTERECTOMY Left 09/11/2020   Procedure: ENDARTERECTOMY CAROTID LEFT;  Surgeon: Marty Heck, MD;  Location: [place];  Service: Vascular;  Laterality: Left;   LEFT HEART CATH AND CORONARY ANGIOGRAPHY N/A 04/07/2018   Procedure: LEFT HEART CATH AND CORONARY ANGIOGRAPHY;  Surgeon: Burnell Blanks, MD;  Location: [place] [facility];  Service: Cardiovascular;  Laterality: N/A;   MELANOMA EXCISION  07/2019   PATCH ANGIOPLASTY Left 09/11/2020   Procedure: PATCH ANGIOPLASTY;  Surgeon: Marty Heck, MD;  Location: [place];  Service: Vascular;  Laterality: Left;   POLYPECTOMY  09/23/2016   Procedure: POLYPECTOMY;  Surgeon: Daneil Dolin, MD;  [facility];  Service: Endoscopy;;  colon   POLYPECTOMY  12/27/2019   Procedure: POLYPECTOMY;  Surgeon: Daneil Dolin, MD;  [facility];  Service: Endoscopy;;   RETINAL DETACHMENT SURGERY      Social History   Socioeconomic History   Marital status: Married    Spouse name: Not on file   Number of  children: Not on file   Years of education: Not on file   Highest education level: Not on file  Occupational History   Not on file  Tobacco Use   Smoking status: Former    Packs/day: 1.50    Years: 30.00    Total pack years: 45.00    Types: Cigarettes    Quit date: 07/05/2012    Years since quitting: 9.7    Passive exposure: Never   Smokeless tobacco: Never  Vaping Use   Vaping Use: Never used   Substance and Sexual Activity   Alcohol use: No   Drug use: No   Sexual activity: Not Currently    Birth control/protection: None  Other Topics Concern   Not on file  Social History Narrative   Not on file   Social Determinants of Health   Financial Resource Strain: Low Risk  (02/21/2022)   Overall Financial Resource Strain (CARDIA)    Difficulty of Paying Living Expenses: Not hard at all  Food Insecurity: Not on file  Transportation Needs: No Transportation Needs (02/21/2022)   PRAPARE - Hydrologist (Medical): No    Lack of Transportation (Non-Medical): No  Physical Activity: Not on file  Stress: Not on file  Social Connections: Socially Integrated (12/07/2021)   Social Connection and Isolation Panel [NHANES]    Frequency of Communication with Friends and Family: More than three times a week    Frequency of Social Gatherings with Friends and Family: More than three times a week    Attends Religious Services: More than 4 times per year    Active Member of [employer] or Organizations: Yes    Attends Music therapist: More than 4 times per year    Marital Status: Married  Human resources officer Violence: Not on file    Current Outpatient Medications on File Prior to Visit  Medication Sig Dispense Refill   ALPRAZolam (XANAX) 0.5 MG tablet Take 0.5 mg by mouth 2 (two) times daily as needed for anxiety.     amLODipine (NORVASC) 10 MG tablet Take 1 tablet (10 mg total) by mouth daily. 30 tablet 1   apixaban (ELIQUIS) 5 MG TABS tablet Take 1 tablet (5 mg total) by mouth 2 (two) times daily. Restart medication tomorrow, 09/12/2020 60 tablet 6   atorvastatin (LIPITOR) 80 MG tablet TAKE ONE TABLET BY MOUTH EVERYDAY AT BEDTIME 90 tablet 2   carvedilol (COREG) 12.5 MG tablet TAKE ONE TABLET BY MOUTH TWICE DAILY 180 tablet 1   DULoxetine (CYMBALTA) 60 MG capsule Take 60 mg by mouth daily with lunch.      ergocalciferol (VITAMIN D2) 50000 units capsule Take  50,000 Units by mouth every Sunday.      ezetimibe (ZETIA) 10 MG tablet Take 1 tablet (10 mg total) by mouth daily. 30 tablet 5   gabapentin (NEURONTIN) 300 MG capsule Take 300 mg by mouth 2 (two) times daily.      glimepiride (AMARYL) 4 MG tablet Take 4 mg by mouth daily.     glucose blood (ONETOUCH ULTRA) test strip USE ONE TEST STRIP AS DIRECTED TWICE DAILY 100 each 0   hydrALAZINE (APRESOLINE) 50 MG tablet Take 1 tablet (50 mg total) by mouth 3 (three) times daily. 270 tablet 3   HYDROcodone-acetaminophen (NORCO/VICODIN) 5-325 MG tablet Take 1 tablet by mouth every 4 (four) hours as needed.     insulin NPH Human (NOVOLIN N RELION) 100 UNIT/ML injection Inject 0.5 mLs (50 Units total) into  the skin every morning. And syringes 1/day 20 mL 11   mupirocin ointment (BACTROBAN) 2 % Apply 1 application topically daily as needed (melonoma).     nitroGLYCERIN (NITROSTAT) 0.4 MG SL tablet Place 1 tablet (0.4 mg total) under the tongue every 5 (five) minutes as needed. (Patient taking differently: Place 0.4 mg under the tongue every 5 (five) minutes as needed for chest pain.) 25 tablet 2   oxyCODONE-acetaminophen (PERCOCET/ROXICET) 5-325 MG tablet Take 1-2 tablets by mouth every 4 (four) hours as needed for moderate pain. 10 tablet 0   rOPINIRole (REQUIP) 1 MG tablet Take 1 mg by mouth 4 (four) times daily.      timolol (TIMOPTIC) 0.5 % ophthalmic solution INSTILL 1 DROP INTO EACH EYE TWICE DAILY (Patient taking differently: Place 1 drop into both eyes 2 (two) times daily.) 10 mL 10   UNIFINE PENTIPS 31G X 5 MM MISC USE TO INJECT INSULIN AT BEDTIME (Patient taking differently: at bedtime.) 100 each 5   No current facility-administered medications on file prior to visit.      HPI   HATTON KASDORF is a 72 y.o.-year-old male, referred by his PCP, Dr. Gerarda Fraction, for evaluation for multinodular goiter.  This was incidentally found on CT chest following up on possible lung nodule.  He was then sent for thyroid  ultrasound to assess in depth. And subsequently was sent for FNA of mildly suspicious thyroid nodule.  Unfortunately, the specimen obtained was not enough to rule out malignancy, thus will need to be repeated in the future.  Thyroid U/S: 01/21/22 CLINICAL DATA:  Incidental on CT.   EXAM: THYROID ULTRASOUND   TECHNIQUE: Ultrasound examination of the thyroid gland and adjacent soft tissues was performed.   COMPARISON:  CT chest January 14, 2022   FINDINGS: Parenchymal Echotexture: Moderately heterogenous   Isthmus: 1.1 cm   Right lobe: 7.8 x 3.1 x 3.0 cm   Left lobe: 6.6 x 3.2 x 2.8 cm   _________________________________________________________   Estimated total number of nodules >/= 1 cm:   Number of spongiform nodules >/=  2 cm not described below (TR1): 0   Number of mixed cystic and solid nodules >/= 1.5 cm not described below (TR2): 0   _________________________________________________________   Nodule labeled 4 appears to be a mixed cystic and solid isoechoic TR 2 nodule in the thyroid isthmus measuring 1.5 cm. This nodule does NOT meet TI-RADS criteria for biopsy or dedicated follow-up.   Nodule labeled 1 is a mixed cystic and solid isoechoic TR 2 nodule in the superior right thyroid lobe measuring 1.7 cm. This nodule does NOT meet TI-RADS criteria for biopsy or dedicated follow-up.   Nodule labeled 2 is a mixed cystic and solid isoechoic TR 2 nodule in the mid right thyroid lobe measuring 1.1 cm. This nodule does NOT meet TI-RADS criteria for biopsy or dedicated follow-up.   Nodule labeled 3 appears to be best described as a solid isoechoic TR 3 nodule in the inferior right thyroid lobe measuring 3.7 x 3.2 x 3.0 cm. **Given size (>/= 2.5 cm) and appearance, fine needle aspiration of this mildly suspicious nodule should be considered based on TI-RADS criteria.   Nodule labeled 5 is a mixed cystic and solid isoechoic TR 2 nodule in the superior left thyroid  lobe measuring 1.5 cm. This nodule does NOT meet TI-RADS criteria for biopsy or dedicated follow-up.   Nodule labeled 6 is a mixed cystic and solid isoechoic TR 2 nodule in the posterior  aspect of the mid left thyroid lobe measuring 1.5 cm. This nodule does NOT meet TI-RADS criteria for biopsy or dedicated follow-up.   IMPRESSION: 1. Enlarged multinodular thyroid gland. 2. Nodule labeled 3 in the inferior right thyroid lobe (3.7 cm TR 3) meets criteria for biopsy. 3. A number of additional benign-appearing nodules as described do not meet criteria for further dedicated follow-up or biopsy.   The above is in keeping with the ACR TI-RADS recommendations - J Am Coll Radiol 2017;14:587-595.     Electronically Signed   By: Albin Felling M.D.   On: 01/22/2022 14:02   I reviewed pt's thyroid tests: Lab Results  Component Value Date   TSH 0.434 (L) 03/04/2022   TSH 0.17 (L) 09/21/2020   TSH 0.166 (L) 02/29/2020   TSH 0.668 04/17/2018   TSH 0.547 04/11/2018   TSH 0.314 (L) 01/07/2017   TSH 0.410 (L) 01/01/2016   FREET4 0.99 03/04/2022   FREET4 0.82 09/21/2020   FREET4 1.20 02/29/2020   FREET4 1.13 01/07/2017   FREET4 1.05 01/01/2016     Pt c/o: - decreased sex drive - difficulty breathing when laying down (sleeps in recliner)  Pt denies - feeling nodules in neck - hoarseness - dysphagia - choking   No FH of thyroid ds. No FH of thyroid cancer. No h/o radiation tx to head or neck.  No seaweed or kelp. No recent contrast studies. No steroid use. No herbal supplements. No Biotin supplements or Hair, Skin and Nails vitamins.  Pt also has a history of DM, HLD, PVD, Afib, CAD  with MI, HTN.  Review of systems  Constitutional: + decreasing body weight,  current Body mass index is 31.99 kg/m. , no fatigue, no subjective hyperthermia, no subjective hypothermia, decreased sex drive Eyes: no blurry vision, no xerophthalmia ENT: no sore throat, no nodules palpated in  throat, no dysphagia/odynophagia, no hoarseness Cardiovascular: no chest pain, no shortness of breath, no palpitations, no leg swelling Respiratory: + cough- since FNA (likely from inflammation), no shortness of breath, + difficulty breathing when laying down at night (sleeps in recliner) Gastrointestinal: no nausea/vomiting/diarrhea Musculoskeletal: no muscle/joint aches Skin: no rashes, no hyperemia Neurological: no tremors, no numbness, no tingling, no dizziness Psychiatric: no depression, no anxiety  ---------------------------------------------------------------------------------------------------------------------- Objective    BP 130/70 (BP Location: Right Arm, Patient Position: Sitting, Cuff Size: Large) Comment: Retake Manuel Cuff  Pulse 72   Ht '5\' 11"'$  (1.803 m)   Wt 229 lb 6.4 oz (104.1 kg)   BMI 31.99 kg/m    BP Readings from Last 3 Encounters:  04/04/22 130/70  03/13/22 (!) 145/77  03/04/22 (!) 144/90    Wt Readings from Last 3 Encounters:  04/04/22 229 lb 6.4 oz (104.1 kg)  03/04/22 236 lb 6.4 oz (107.2 kg)  11/20/21 233 lb (105.7 kg)    Physical Exam- Limited  Constitutional:  Body mass index is 31.99 kg/m. , not in acute distress, normal state of mind Eyes:  EOMI, no exophthalmos Neck: Supple Musculoskeletal: no gross deformities, strength intact in all four extremities, no gross restriction of joint movements Skin:  no rashes, no hyperemia Neurological: no tremor with outstretched hands    FNA on 03/12/22  CYTOLOGY - NON PAP  CASE: [id number]  PATIENT: Xavier Caldwell  Non-Gynecological Cytology Report      Clinical History: 3.7 cm RLL; TR3  Specimen Submitted:  A. THYROID, RLL, FINE NEEDLE ASPIRATION:    FINAL MICROSCOPIC DIAGNOSIS:  - Scant follicular epithelium present (Bethesda category I)  SPECIMEN ADEQUACY:  Satisfactory but limited for evaluation, scant cellularity   DIAGNOSTIC COMMENTS:  Inflammation present.   GROSS:  Received  is/are 6 slides in 95% Ethyl alcohol, and 30 ccs of pale pink  Cytolyt solution. (CM:cm)  Prepared:  Smears: 6  Concentration Method (Thin Prep):  1  Cell Block:  Cell block attempted, not obtained.  Additional Studies:  Also there was an Afirma collected.   ----------------------------------------------------------------------------------------------------------------------  ASSESSMENT / PLAN:  1. Thyroid Nodule  - He had FNA on 03/12/22 to assess for malignancy of mildly suspicious TR3 nodule which unfortunately only had scant cellularity present, therefore malignancy could not be ruled out.  He will need repeat FNA in the future, but will give him time for any residual inflammation from previous biopsy to subside first.  His thyroid antibody testing was negative, ruling out autoimmune thyroid dysfunction.  His TSH was marginally suppressed but improving from previous readings.  All other thyroid labs were normal.  Will plan to recheck TFTs prior to next visit in 2 months and then consider reordering the FNA to be repeated at that time.     FOLLOW UP PLAN: Return in about 2 months (around 06/03/2022) for Thyroid follow up, Previsit labs.      I spent  34  minutes in the care of the patient today including review of labs from Thyroid Function, CMP, and other relevant labs ; imaging/biopsy records (current and previous including abstractions from other facilities); face-to-face time discussing  his lab results and symptoms, medications doses, his options of short and long term treatment based on the latest standards of care / guidelines;   and documenting the encounter.  [name]  participated in the discussions, expressed understanding, and voiced agreement with the above plans.  All questions were answered to his satisfaction. he is encouraged to contact clinic should he have any questions or concerns prior to his return visit.    Rayetta Pigg, [facility] [address] [place], [place] [address] Phone: [phone] Fax: [phone]

## 2022-04-05 ENCOUNTER — Ambulatory Visit: Payer: Self-pay | Admitting: *Deleted

## 2022-04-05 ENCOUNTER — Encounter: Payer: Self-pay | Admitting: *Deleted

## 2022-04-05 NOTE — Patient Outreach (Signed)
  Care Coordination   Follow Up Visit Note   04/05/2022 Name: Xavier Caldwell MRN: [id number] DOB: 1950/04/04  Xavier Caldwell is a 72 y.o. year old male who sees Redmond School, MD for primary care. I spoke with  [name] by phone today.  What matters to the patients health and wellness today?  Managing edema and following up on multinodular thyroid goiter    Goals Addressed             This Visit's Progress    Edema Management   On track    Care Coordination Goals: Patient will continue to monitor intermittent bilateral LE and left hand edema Patient will reach out to provider with any new or worsening symptoms  Patient will continue to take medications as prescribed  Patient will reach out to [place] [phone] for any care coordination or resource needs      Increase Knowledge of Thryoid Nodules and Treatment Options   On track    Care Coordination Goals: Patient will keep follow-up appointment with endocrinologist on 06/05/22 Patient will reach out to endocrinologist with any new or worsening symptoms related to thyroid function and multinodular goiter Patient will work with endocrinology office to schedule 2nd thyroid biopsy in about 2 months. First biopsy did not have enough specimen for adequate evaluation. Patient will reach out to RN Care Coordinator with any care coordinator or resource needs [phone]        SDOH assessments and interventions completed:  Yes  SDOH Interventions Today    Flowsheet Row Most Recent Value  SDOH Interventions   Transportation Interventions Intervention Not Indicated  Financial Strain Interventions Intervention Not Indicated        Care Coordination Interventions:  Yes, provided  Interventions Today    Flowsheet Row Most Recent Value  Chronic Disease   Chronic disease during today's visit Other  [Edema, Multinodular Thyroid Goiter]  General Interventions   General Interventions Discussed/Reviewed Labs   [Discussed thyroid imaging and biopsy again and need to repeat due to insufficient specimen. Will need to allow time for thyroid to heal before next biopsy otherwise it may be innacurate due to inflammation. Reviewd recent labs.]  Exercise Interventions   Exercise Discussed/Reviewed Physical Activity  Physical Activity Discussed/Reviewed Physical Activity Discussed  [patient walks between 10,000 and 12,000 each night he works]  Education Interventions   Education Provided Provided Education  Provided Verbal Education On When to see the doctor, Other  [possible treatment options depending on results of thyroid biopsy. New or worsening edema symptoms.]  Mental Health Interventions   Mental Health Discussed/Reviewed Coping Strategies       Follow up plan: Follow up call scheduled for 05/07/22    Encounter Outcome:  Pt. Visit Completed   Chong Sicilian, BSN, RN-BC RN Care Coordinator [place]: [phone] Main #: [phone]

## 2022-04-19 DIAGNOSIS — E1165 Type 2 diabetes mellitus with hyperglycemia: Secondary | ICD-10-CM | POA: Diagnosis not present

## 2022-04-19 DIAGNOSIS — Z0001 Encounter for general adult medical examination with abnormal findings: Secondary | ICD-10-CM | POA: Diagnosis not present

## 2022-04-19 DIAGNOSIS — I4891 Unspecified atrial fibrillation: Secondary | ICD-10-CM | POA: Diagnosis not present

## 2022-04-19 DIAGNOSIS — E1140 Type 2 diabetes mellitus with diabetic neuropathy, unspecified: Secondary | ICD-10-CM | POA: Diagnosis not present

## 2022-04-19 DIAGNOSIS — I5023 Acute on chronic systolic (congestive) heart failure: Secondary | ICD-10-CM | POA: Diagnosis not present

## 2022-04-19 DIAGNOSIS — I130 Hypertensive heart and chronic kidney disease with heart failure and stage 1 through stage 4 chronic kidney disease, or unspecified chronic kidney disease: Secondary | ICD-10-CM | POA: Diagnosis not present

## 2022-04-19 DIAGNOSIS — N183 Chronic kidney disease, stage 3 unspecified: Secondary | ICD-10-CM | POA: Diagnosis not present

## 2022-04-19 DIAGNOSIS — E6609 Other obesity due to excess calories: Secondary | ICD-10-CM | POA: Diagnosis not present

## 2022-04-19 DIAGNOSIS — E1129 Type 2 diabetes mellitus with other diabetic kidney complication: Secondary | ICD-10-CM | POA: Diagnosis not present

## 2022-04-19 DIAGNOSIS — I700 Atherosclerosis of aorta: Secondary | ICD-10-CM | POA: Diagnosis not present

## 2022-04-19 DIAGNOSIS — Z6833 Body mass index (BMI) 33.0-33.9, adult: Secondary | ICD-10-CM | POA: Diagnosis not present

## 2022-05-05 DIAGNOSIS — I5023 Acute on chronic systolic (congestive) heart failure: Secondary | ICD-10-CM | POA: Diagnosis not present

## 2022-05-05 DIAGNOSIS — I130 Hypertensive heart and chronic kidney disease with heart failure and stage 1 through stage 4 chronic kidney disease, or unspecified chronic kidney disease: Secondary | ICD-10-CM | POA: Diagnosis not present

## 2022-05-05 DIAGNOSIS — E1122 Type 2 diabetes mellitus with diabetic chronic kidney disease: Secondary | ICD-10-CM | POA: Diagnosis not present

## 2022-05-07 ENCOUNTER — Ambulatory Visit: Payer: Self-pay | Admitting: *Deleted

## 2022-05-07 ENCOUNTER — Encounter: Payer: Self-pay | Admitting: *Deleted

## 2022-05-08 NOTE — Patient Outreach (Signed)
  Care Coordination   Follow Up Visit Note   05/07/2022 Name: Xavier Caldwell MRN: [id number] DOB: 12/27/1950  Xavier Caldwell is a 72 y.o. year old male who sees Redmond School, MD for primary care. I spoke with  Xavier Caldwell by phone today.  What matters to the patients health and wellness today?  Following up on multinodular thyroid    Goals Addressed             This Visit's Progress    Increase Knowledge of Thryoid Nodules and Treatment Options   On track    Care Coordination Goals: Patient will keep follow-up appointment with endocrinologist on 06/05/22 Patient will reach out to endocrinologist with any new or worsening symptoms related to thyroid function and multinodular goiter Patient will work with endocrinology office to schedule 2nd thyroid biopsy in about 2 months. First biopsy did not have enough specimen for adequate evaluation. Patient will reach out to RN Care Coordinator with any care coordinator or resource needs [phone]        SDOH assessments and interventions completed:  No    Care Coordination Interventions:  Yes, provided  Interventions Today    Flowsheet Row Most Recent Value  Chronic Disease   Chronic disease during today's visit Other  [multinodular thyroid]  General Interventions   General Interventions Discussed/Reviewed General Interventions Discussed, General Interventions Reviewed, Doctor Visits  Doctor Visits Discussed/Reviewed Doctor Visits Discussed, Doctor Visits Reviewed, Specialist  PCP/Specialist Visits Compliance with follow-up visit  Exercise Interventions   Exercise Discussed/Reviewed Physical Activity  Physical Activity Discussed/Reviewed Physical Activity Discussed, Physical Activity Reviewed  [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 Considering decreasing work hours so he can do more around his home and travel.]  Education Interventions   Education Provided Provided Education  Provided Verbal  Education On When to see the doctor, Mental Health/Coping with Illness       Follow up plan: Follow up call scheduled for 06/06/22    Encounter Outcome:  Pt. Visit Completed   Xavier Caldwell, BSN, RN-BC RN Care Coordinator [place] Direct Dial: [phone] Main #: [phone]

## 2022-05-16 ENCOUNTER — Observation Stay (HOSPITAL_COMMUNITY)
Admission: EM | Admit: 2022-05-16 | Discharge: 2022-05-17 | Disposition: A | Discharge status: Home / Self Care | Payer: Medicare HMO | Attending: Family Medicine | Admitting: Family Medicine

## 2022-05-16 ENCOUNTER — Other Ambulatory Visit: Payer: Self-pay

## 2022-05-16 ENCOUNTER — Encounter (HOSPITAL_COMMUNITY): Payer: Self-pay | Admitting: Emergency Medicine

## 2022-05-16 ENCOUNTER — Observation Stay (HOSPITAL_COMMUNITY): Payer: Medicare HMO

## 2022-05-16 ENCOUNTER — Emergency Department (HOSPITAL_COMMUNITY): Payer: Medicare HMO

## 2022-05-16 DIAGNOSIS — Z79899 Other long term (current) drug therapy: Secondary | ICD-10-CM | POA: Insufficient documentation

## 2022-05-16 DIAGNOSIS — K5900 Constipation, unspecified: Secondary | ICD-10-CM | POA: Diagnosis not present

## 2022-05-16 DIAGNOSIS — Z794 Long term (current) use of insulin: Secondary | ICD-10-CM | POA: Diagnosis not present

## 2022-05-16 DIAGNOSIS — Z7984 Long term (current) use of oral hypoglycemic drugs: Secondary | ICD-10-CM | POA: Diagnosis not present

## 2022-05-16 DIAGNOSIS — J449 Chronic obstructive pulmonary disease, unspecified: Secondary | ICD-10-CM | POA: Diagnosis not present

## 2022-05-16 DIAGNOSIS — Z955 Presence of coronary angioplasty implant and graft: Secondary | ICD-10-CM | POA: Diagnosis not present

## 2022-05-16 DIAGNOSIS — N1831 Chronic kidney disease, stage 3a: Secondary | ICD-10-CM

## 2022-05-16 DIAGNOSIS — I480 Paroxysmal atrial fibrillation: Secondary | ICD-10-CM | POA: Diagnosis not present

## 2022-05-16 DIAGNOSIS — Z87891 Personal history of nicotine dependence: Secondary | ICD-10-CM | POA: Diagnosis not present

## 2022-05-16 DIAGNOSIS — R109 Unspecified abdominal pain: Secondary | ICD-10-CM | POA: Diagnosis not present

## 2022-05-16 DIAGNOSIS — I472 Ventricular tachycardia, unspecified: Secondary | ICD-10-CM | POA: Diagnosis not present

## 2022-05-16 DIAGNOSIS — E1151 Type 2 diabetes mellitus with diabetic peripheral angiopathy without gangrene: Secondary | ICD-10-CM | POA: Insufficient documentation

## 2022-05-16 DIAGNOSIS — E1169 Type 2 diabetes mellitus with other specified complication: Secondary | ICD-10-CM | POA: Diagnosis not present

## 2022-05-16 DIAGNOSIS — I5021 Acute systolic (congestive) heart failure: Secondary | ICD-10-CM | POA: Diagnosis not present

## 2022-05-16 DIAGNOSIS — I10 Essential (primary) hypertension: Secondary | ICD-10-CM | POA: Diagnosis present

## 2022-05-16 DIAGNOSIS — Z8673 Personal history of transient ischemic attack (TIA), and cerebral infarction without residual deficits: Secondary | ICD-10-CM | POA: Diagnosis not present

## 2022-05-16 DIAGNOSIS — E039 Hypothyroidism, unspecified: Secondary | ICD-10-CM | POA: Insufficient documentation

## 2022-05-16 DIAGNOSIS — I110 Hypertensive heart disease with heart failure: Secondary | ICD-10-CM | POA: Insufficient documentation

## 2022-05-16 DIAGNOSIS — E785 Hyperlipidemia, unspecified: Secondary | ICD-10-CM | POA: Diagnosis not present

## 2022-05-16 DIAGNOSIS — R69 Illness, unspecified: Secondary | ICD-10-CM | POA: Diagnosis present

## 2022-05-16 DIAGNOSIS — K611 Rectal abscess: Secondary | ICD-10-CM | POA: Diagnosis not present

## 2022-05-16 DIAGNOSIS — E119 Type 2 diabetes mellitus without complications: Secondary | ICD-10-CM

## 2022-05-16 DIAGNOSIS — I6529 Occlusion and stenosis of unspecified carotid artery: Secondary | ICD-10-CM | POA: Diagnosis present

## 2022-05-16 DIAGNOSIS — K649 Unspecified hemorrhoids: Secondary | ICD-10-CM | POA: Diagnosis not present

## 2022-05-16 DIAGNOSIS — E0590 Thyrotoxicosis, unspecified without thyrotoxic crisis or storm: Secondary | ICD-10-CM | POA: Diagnosis present

## 2022-05-16 DIAGNOSIS — E782 Mixed hyperlipidemia: Secondary | ICD-10-CM | POA: Diagnosis present

## 2022-05-16 DIAGNOSIS — I25110 Atherosclerotic heart disease of native coronary artery with unstable angina pectoris: Secondary | ICD-10-CM | POA: Insufficient documentation

## 2022-05-16 DIAGNOSIS — R0682 Tachypnea, not elsewhere classified: Secondary | ICD-10-CM | POA: Diagnosis not present

## 2022-05-16 DIAGNOSIS — E1122 Type 2 diabetes mellitus with diabetic chronic kidney disease: Secondary | ICD-10-CM | POA: Diagnosis not present

## 2022-05-16 DIAGNOSIS — R079 Chest pain, unspecified: Secondary | ICD-10-CM | POA: Diagnosis not present

## 2022-05-16 DIAGNOSIS — Z7901 Long term (current) use of anticoagulants: Secondary | ICD-10-CM

## 2022-05-16 DIAGNOSIS — I739 Peripheral vascular disease, unspecified: Secondary | ICD-10-CM | POA: Diagnosis present

## 2022-05-16 DIAGNOSIS — Z8669 Personal history of other diseases of the nervous system and sense organs: Secondary | ICD-10-CM

## 2022-05-16 LAB — URINALYSIS, ROUTINE W REFLEX MICROSCOPIC
Bacteria, UA: NONE SEEN
Bilirubin Urine: NEGATIVE
Glucose, UA: 150 mg/dL — AB
Hgb urine dipstick: NEGATIVE
Ketones, ur: NEGATIVE mg/dL
Leukocytes,Ua: NEGATIVE
Nitrite: NEGATIVE
Protein, ur: 30 mg/dL — AB
Specific Gravity, Urine: 1.018 (ref 1.005–1.030)
pH: 7 (ref 5.0–8.0)

## 2022-05-16 LAB — HEMOGLOBIN A1C
Hgb A1c MFr Bld: 8.5 % — ABNORMAL HIGH (ref 4.8–5.6)
Mean Plasma Glucose: 197.25 mg/dL

## 2022-05-16 LAB — COMPREHENSIVE METABOLIC PANEL
ALT: 24 U/L (ref 0–44)
AST: 20 U/L (ref 15–41)
Albumin: 3.4 g/dL — ABNORMAL LOW (ref 3.5–5.0)
Alkaline Phosphatase: 74 U/L (ref 38–126)
Anion gap: 10 (ref 5–15)
BUN: 14 mg/dL (ref 8–23)
CO2: 25 mmol/L (ref 22–32)
Calcium: 8.7 mg/dL — ABNORMAL LOW (ref 8.9–10.3)
Chloride: 100 mmol/L (ref 98–111)
Creatinine, Ser: 1.24 mg/dL (ref 0.61–1.24)
GFR, Estimated: >60 mL/min (ref >60)
Glucose, Bld: 188 mg/dL — ABNORMAL HIGH (ref 70–99)
Potassium: 3.9 mmol/L (ref 3.5–5.1)
Sodium: 135 mmol/L (ref 135–145)
Total Bilirubin: 0.7 mg/dL (ref 0.3–1.2)
Total Protein: 6.9 g/dL (ref 6.5–8.1)

## 2022-05-16 LAB — MRSA NEXT GEN BY PCR, NASAL: MRSA by PCR Next Gen: NOT DETECTED

## 2022-05-16 LAB — CBG MONITORING, ED
Glucose-Capillary: 166 mg/dL — ABNORMAL HIGH (ref 70–99)
Glucose-Capillary: 151 mg/dL — ABNORMAL HIGH (ref 70–99)

## 2022-05-16 LAB — CBC WITH DIFFERENTIAL/PLATELET
Abs Immature Granulocytes: 0.02 10*3/uL (ref 0.00–0.07)
Basophils Absolute: 0 10*3/uL (ref 0.0–0.1)
Basophils Relative: 0 %
Eosinophils Absolute: 0.1 10*3/uL (ref 0.0–0.5)
Eosinophils Relative: 1 %
HCT: 37.4 % — ABNORMAL LOW (ref 39.0–52.0)
Hemoglobin: 12.3 g/dL — ABNORMAL LOW (ref 13.0–17.0)
Immature Granulocytes: 0 %
Lymphocytes Relative: 15 %
Lymphs Abs: 1.4 10*3/uL (ref 0.7–4.0)
MCH: 28.2 pg (ref 26.0–34.0)
MCHC: 32.9 g/dL (ref 30.0–36.0)
MCV: 85.8 fL (ref 80.0–100.0)
Monocytes Absolute: 1 10*3/uL (ref 0.1–1.0)
Monocytes Relative: 10 %
Neutro Abs: 7.3 10*3/uL (ref 1.7–7.7)
Neutrophils Relative %: 74 %
Platelets: 218 10*3/uL (ref 150–400)
RBC: 4.36 MIL/uL (ref 4.22–5.81)
RDW: 14.6 % (ref 11.5–15.5)
WBC: 9.8 10*3/uL (ref 4.0–10.5)
nRBC: 0 % (ref 0.0–0.2)

## 2022-05-16 LAB — GLUCOSE, CAPILLARY
Glucose-Capillary: 174 mg/dL — ABNORMAL HIGH (ref 70–99)
Glucose-Capillary: 138 mg/dL — ABNORMAL HIGH (ref 70–99)

## 2022-05-16 LAB — CULTURE, BLOOD (ROUTINE X 2): Special Requests: ADEQUATE

## 2022-05-16 LAB — AEROBIC/ANAEROBIC CULTURE W GRAM STAIN (SURGICAL/DEEP WOUND)

## 2022-05-16 MED ORDER — CHLORHEXIDINE GLUCONATE CLOTH 2 % EX PADS
6.0000 | MEDICATED_PAD | Freq: Every day | CUTANEOUS | Status: DC
  Administered 2022-05-16 – 2022-05-17 (×2): 6 via TOPICAL

## 2022-05-16 MED ORDER — INSULIN DETEMIR 100 UNIT/ML ~~LOC~~ SOLN
7.0000 [IU] | Freq: Once | SUBCUTANEOUS | Status: AC
  Administered 2022-05-16: 7 [IU] via SUBCUTANEOUS
  Filled 2022-05-16: qty 0.07

## 2022-05-16 MED ORDER — FENTANYL CITRATE PF 50 MCG/ML IJ SOSY: 25.0000 ug | PREFILLED_SYRINGE | INTRAMUSCULAR | Status: DC | PRN

## 2022-05-16 MED ORDER — ROPINIROLE HCL 1 MG PO TABS
1.0000 mg | ORAL_TABLET | Freq: Four times a day (QID) | ORAL | Status: DC
  Administered 2022-05-16 – 2022-05-17 (×5): 1 mg via ORAL
  Filled 2022-05-16 (×5): qty 1

## 2022-05-16 MED ORDER — NITROGLYCERIN 0.4 MG SL SUBL: 0.4000 mg | SUBLINGUAL_TABLET | SUBLINGUAL | Status: DC | PRN

## 2022-05-16 MED ORDER — METRONIDAZOLE 500 MG/100ML IV SOLN
500.0000 mg | Freq: Two times a day (BID) | INTRAVENOUS | Status: DC
  Administered 2022-05-16 – 2022-05-17 (×3): 500 mg via INTRAVENOUS
  Filled 2022-05-16 (×3): qty 100

## 2022-05-16 MED ORDER — INSULIN DETEMIR 100 UNIT/ML ~~LOC~~ SOLN
25.0000 [IU] | Freq: Every day | SUBCUTANEOUS | Status: DC
  Filled 2022-05-16: qty 0.25

## 2022-05-16 MED ORDER — CARVEDILOL 12.5 MG PO TABS
25.0000 mg | ORAL_TABLET | Freq: Two times a day (BID) | ORAL | Status: DC
  Administered 2022-05-16: 25 mg via ORAL
  Filled 2022-05-16: qty 2

## 2022-05-16 MED ORDER — EZETIMIBE 10 MG PO TABS
10.0000 mg | ORAL_TABLET | Freq: Every day | ORAL | Status: DC
  Administered 2022-05-16 – 2022-05-17 (×2): 10 mg via ORAL
  Filled 2022-05-16 (×2): qty 1

## 2022-05-16 MED ORDER — ATORVASTATIN CALCIUM 40 MG PO TABS
80.0000 mg | ORAL_TABLET | Freq: Every day | ORAL | Status: DC
  Administered 2022-05-16 – 2022-05-17 (×2): 80 mg via ORAL
  Filled 2022-05-16 (×2): qty 2

## 2022-05-16 MED ORDER — INSULIN ASPART 100 UNIT/ML IJ SOLN: 0.0000 [IU] | Freq: Every day | INTRAMUSCULAR | Status: DC

## 2022-05-16 MED ORDER — GABAPENTIN 300 MG PO CAPS
300.0000 mg | ORAL_CAPSULE | Freq: Two times a day (BID) | ORAL | Status: DC
  Administered 2022-05-16 – 2022-05-17 (×3): 300 mg via ORAL
  Filled 2022-05-16 (×3): qty 1

## 2022-05-16 MED ORDER — IOHEXOL 300 MG/ML  SOLN
100.0000 mL | Freq: Once | INTRAMUSCULAR | Status: AC | PRN
  Administered 2022-05-16: 100 mL via INTRAVENOUS

## 2022-05-16 MED ORDER — INSULIN NPH (HUMAN) (ISOPHANE) 100 UNIT/ML ~~LOC~~ SUSP
20.0000 [IU] | Freq: Two times a day (BID) | SUBCUTANEOUS | Status: DC
  Filled 2022-05-16: qty 10

## 2022-05-16 MED ORDER — CIPROFLOXACIN IN D5W 400 MG/200ML IV SOLN
400.0000 mg | Freq: Two times a day (BID) | INTRAVENOUS | Status: DC
  Administered 2022-05-16 – 2022-05-17 (×2): 400 mg via INTRAVENOUS
  Filled 2022-05-16 (×2): qty 200

## 2022-05-16 MED ORDER — HYDROCORTISONE ACETATE 25 MG RE SUPP
25.0000 mg | Freq: Two times a day (BID) | RECTAL | Status: DC
  Administered 2022-05-16 (×3): 25 mg via RECTAL
  Filled 2022-05-16 (×4): qty 1

## 2022-05-16 MED ORDER — SENNOSIDES-DOCUSATE SODIUM 8.6-50 MG PO TABS
1.0000 | ORAL_TABLET | Freq: Every day | ORAL | Status: DC
  Administered 2022-05-16: 1 via ORAL
  Filled 2022-05-16: qty 1

## 2022-05-16 MED ORDER — LABETALOL HCL 5 MG/ML IV SOLN: 10.0000 mg | INTRAVENOUS | Status: DC | PRN

## 2022-05-16 MED ORDER — ACETAMINOPHEN 650 MG RE SUPP: 650.0000 mg | Freq: Four times a day (QID) | RECTAL | Status: DC | PRN

## 2022-05-16 MED ORDER — HYDRALAZINE HCL 25 MG PO TABS
50.0000 mg | ORAL_TABLET | Freq: Three times a day (TID) | ORAL | Status: DC
  Administered 2022-05-16 – 2022-05-17 (×4): 50 mg via ORAL
  Filled 2022-05-16 (×4): qty 2

## 2022-05-16 MED ORDER — ACETAMINOPHEN 325 MG PO TABS
650.0000 mg | ORAL_TABLET | Freq: Four times a day (QID) | ORAL | Status: DC | PRN
  Administered 2022-05-16: 650 mg via ORAL
  Filled 2022-05-16: qty 2

## 2022-05-16 MED ORDER — ALPRAZOLAM 0.5 MG PO TABS: 0.5000 mg | ORAL_TABLET | Freq: Two times a day (BID) | ORAL | Status: DC | PRN

## 2022-05-16 MED ORDER — FUROSEMIDE 40 MG PO TABS
40.0000 mg | ORAL_TABLET | Freq: Every day | ORAL | Status: DC
  Administered 2022-05-17: 40 mg via ORAL
  Filled 2022-05-16: qty 1

## 2022-05-16 MED ORDER — ONDANSETRON HCL 4 MG PO TABS: 4.0000 mg | ORAL_TABLET | Freq: Four times a day (QID) | ORAL | Status: DC | PRN

## 2022-05-16 MED ORDER — HYDROMORPHONE HCL 1 MG/ML IJ SOLN
1.0000 mg | Freq: Once | INTRAMUSCULAR | Status: DC
  Filled 2022-05-16: qty 1

## 2022-05-16 MED ORDER — LIDOCAINE HCL (PF) 1 % IJ SOLN
10.0000 mL | Freq: Once | INTRAMUSCULAR | Status: DC
  Filled 2022-05-16: qty 10

## 2022-05-16 MED ORDER — ACETAMINOPHEN 325 MG PO TABS: 650.0000 mg | ORAL_TABLET | Freq: Four times a day (QID) | ORAL | Status: DC | PRN

## 2022-05-16 MED ORDER — CARVEDILOL 12.5 MG PO TABS: 12.5000 mg | ORAL_TABLET | Freq: Two times a day (BID) | ORAL | Status: DC

## 2022-05-16 MED ORDER — CARVEDILOL 12.5 MG PO TABS
12.5000 mg | ORAL_TABLET | Freq: Two times a day (BID) | ORAL | Status: DC
  Administered 2022-05-16: 12.5 mg via ORAL
  Filled 2022-05-16: qty 1

## 2022-05-16 MED ORDER — INSULIN NPH (HUMAN) (ISOPHANE) 100 UNIT/ML ~~LOC~~ SUSP
10.0000 [IU] | Freq: Two times a day (BID) | SUBCUTANEOUS | Status: DC
  Administered 2022-05-17: 10 [IU] via SUBCUTANEOUS
  Filled 2022-05-16 (×2): qty 10

## 2022-05-16 MED ORDER — MORPHINE SULFATE (PF) 2 MG/ML IV SOLN: 2.0000 mg | INTRAVENOUS | Status: DC | PRN

## 2022-05-16 MED ORDER — CIPROFLOXACIN IN D5W 400 MG/200ML IV SOLN
400.0000 mg | Freq: Once | INTRAVENOUS | Status: AC
  Administered 2022-05-16: 400 mg via INTRAVENOUS
  Filled 2022-05-16: qty 200

## 2022-05-16 MED ORDER — INSULIN ASPART 100 UNIT/ML IJ SOLN
0.0000 [IU] | Freq: Three times a day (TID) | INTRAMUSCULAR | Status: DC
  Administered 2022-05-16 (×2): 3 [IU] via SUBCUTANEOUS
  Administered 2022-05-17: 2 [IU] via SUBCUTANEOUS
  Administered 2022-05-17: 5 [IU] via SUBCUTANEOUS
  Filled 2022-05-16: qty 1

## 2022-05-16 MED ORDER — DULOXETINE HCL 60 MG PO CPEP
60.0000 mg | ORAL_CAPSULE | Freq: Every day | ORAL | Status: DC
  Administered 2022-05-16 – 2022-05-17 (×2): 60 mg via ORAL
  Filled 2022-05-16: qty 1
  Filled 2022-05-16: qty 2

## 2022-05-16 MED ORDER — POLYETHYLENE GLYCOL 3350 17 G PO PACK
17.0000 g | PACK | Freq: Two times a day (BID) | ORAL | Status: DC
  Administered 2022-05-16 – 2022-05-17 (×2): 17 g via ORAL
  Filled 2022-05-16 (×2): qty 1

## 2022-05-16 MED ORDER — ONDANSETRON HCL 4 MG/2ML IJ SOLN: 4.0000 mg | Freq: Four times a day (QID) | INTRAMUSCULAR | Status: DC | PRN

## 2022-05-16 MED ORDER — OXYCODONE HCL 5 MG PO TABS: 5.0000 mg | ORAL_TABLET | ORAL | Status: DC | PRN

## 2022-05-16 MED ORDER — FUROSEMIDE 10 MG/ML IJ SOLN
40.0000 mg | Freq: Once | INTRAMUSCULAR | Status: AC
  Administered 2022-05-16: 40 mg via INTRAVENOUS
  Filled 2022-05-16: qty 4

## 2022-05-16 MED ORDER — AMLODIPINE BESYLATE 5 MG PO TABS
10.0000 mg | ORAL_TABLET | Freq: Every day | ORAL | Status: DC
  Administered 2022-05-16 – 2022-05-17 (×2): 10 mg via ORAL
  Filled 2022-05-16 (×2): qty 2

## 2022-05-16 NOTE — Consult Note (Signed)
[facility] Consult  Reason for Consult: Perirectal abscess  Referring Physician: Dr. Laural Benes   Chief Complaint   Constipation     HPI: Xavier Caldwell is a 72 y.o. male with a perirectal abscess noted on CT scan. The ED called me last night. The patient reports having pain in the rectal area since Sunday and issues with having a BM. He has been on miralax with no help. He says his pain is better now with pain medication. He was noted to have a left sided perirectal abscess on CT. The ED did not feel the abscess. He had a prior abscess he reports in 2007 that was I&D in the ED. He has not had any issues since that time.   He denies any drainage now. He took his Eliquis last night.   Past Medical History:  Diagnosis Date   Acute systolic (congestive) heart failure    Anxiety    Arthritis    Atrial fibrillation    Back pain    Chronic kidney disease    pt told by PA of Dr. Fransico Him that he had "stage 3 kidney so they had me stop taking Metformin"   Coronary artery disease    Diabetes mellitus without complication    High cholesterol    History of gout    Hypertension    Myocardial infarction 04/2018   Neuropathy    PAD (peripheral artery disease)    Retinal detachment    One on the right and two on the left   Sleep apnea    Stop Bang score of 4   STEMI (ST elevation myocardial infarction)    04/08/2018 PCI/DES RCA   Stroke    February 2022   VT (ventricular tachycardia)     Past Surgical History:  Procedure Laterality Date   APPENDECTOMY     CARDIOVERSION N/A 04/13/2018   Procedure: CARDIOVERSION;  Surgeon: Chilton Si, MD;  Location: [facility] ENDOSCOPY;  Service: Cardiovascular;  Laterality: N/A;   CATARACT EXTRACTION W/PHACO Right 04/25/2014   Procedure: CATARACT EXTRACTION PHACO AND INTRAOCULAR LENS PLACEMENT (IOC);  Surgeon: Susa Simmonds, MD;  Location: [facility] ORS;  Service: Ophthalmology;  Laterality: Right;  CDE:4.70   CATARACT EXTRACTION W/PHACO Left  07/11/2014   Procedure: CATARACT EXTRACTION PHACO AND INTRAOCULAR LENS PLACEMENT LEFT EYE CDE=8.32;  Surgeon: Susa Simmonds, MD;  Location: [facility] ORS;  Service: Ophthalmology;  Laterality: Left;   COLONOSCOPY WITH PROPOFOL N/A 09/23/2016   Rourk: 9 polyps removed, multiple tubular adenomas. recommended 3 yr surveillance   COLONOSCOPY WITH PROPOFOL N/A 12/27/2019   ten sessile polyps, 4-9 mm in size in descending, hepatic flexure, and cecum, 11 mm polyp in sigmoid, three sessile polyps in rectum and mid rectum 2-3 mm in size. Tubular adenomas and hyperplastic. 3 year surveillance   CORONARY/GRAFT ACUTE MI REVASCULARIZATION N/A 04/07/2018   Procedure: Coronary/Graft Acute MI Revascularization;  Surgeon: Kathleene Hazel, MD;  Location: [facility] INVASIVE CV LAB;  Service: Cardiovascular;  Laterality: N/A;   ENDARTERECTOMY Right 03/29/2020   Procedure: RIGHT CAROTID ARTERY ENDARTERECTOMY WITH PATCH ANGIOPLASTY;  Surgeon: Cephus Shelling, MD;  Location: [facility] OR;  Service: Vascular;  Laterality: Right;   ENDARTERECTOMY Left 09/11/2020   Procedure: ENDARTERECTOMY CAROTID LEFT;  Surgeon: Cephus Shelling, MD;  Location: [facility] OR;  Service: Vascular;  Laterality: Left;   LEFT HEART CATH AND CORONARY ANGIOGRAPHY N/A 04/07/2018   Procedure: LEFT HEART CATH AND CORONARY ANGIOGRAPHY;  Surgeon: Kathleene Hazel, MD;  Location: [facility] INVASIVE CV  LAB;  Service: Cardiovascular;  Laterality: N/A;   MELANOMA EXCISION  07/2019   PATCH ANGIOPLASTY Left 09/11/2020   Procedure: PATCH ANGIOPLASTY;  Surgeon: Cephus Shelling, MD;  Location: [facility] OR;  Service: Vascular;  Laterality: Left;   POLYPECTOMY  09/23/2016   Procedure: POLYPECTOMY;  Surgeon: Corbin Ade, MD;  Location: [facility] ENDO SUITE;  Service: Endoscopy;;  colon   POLYPECTOMY  12/27/2019   Procedure: POLYPECTOMY;  Surgeon: Corbin Ade, MD;  Location: [facility] ENDO SUITE;  Service: Endoscopy;;   RETINAL DETACHMENT SURGERY      Family History  Problem Relation  Age of Onset   Hypertension Father    Colon cancer Neg Hx    Diabetes Neg Hx     Social History   Tobacco Use   Smoking status: Former    Packs/day: 1.50    Years: 30.00    Additional pack years: 0.00    Total pack years: 45.00    Types: Cigarettes    Quit date: 07/05/2012    Years since quitting: 9.8    Passive exposure: Never   Smokeless tobacco: Never  Vaping Use   Vaping Use: Never used  Substance Use Topics   Alcohol use: No   Drug use: No    Medications: I have reviewed the patient's current medications. Prior to Admission:  Medications Prior to Admission  Medication Sig Dispense Refill Last Dose   ALPRAZolam (XANAX) 0.5 MG tablet Take 0.5 mg by mouth 2 (two) times daily as needed for anxiety.   unk   amLODipine (NORVASC) 10 MG tablet Take 1 tablet (10 mg total) by mouth daily. 30 tablet 1 05/15/2022   apixaban (ELIQUIS) 5 MG TABS tablet Take 1 tablet (5 mg total) by mouth 2 (two) times daily. Restart medication tomorrow, 09/12/2020 60 tablet 6 05/15/2022 at pm   atorvastatin (LIPITOR) 80 MG tablet TAKE ONE TABLET BY MOUTH EVERYDAY AT BEDTIME 90 tablet 2 05/15/2022   carvedilol (COREG) 12.5 MG tablet TAKE ONE TABLET BY MOUTH TWICE DAILY 180 tablet 1 05/15/2022 at pm   DULoxetine (CYMBALTA) 60 MG capsule Take 60 mg by mouth daily with lunch.    05/15/2022   ezetimibe (ZETIA) 10 MG tablet Take 1 tablet (10 mg total) by mouth daily. 30 tablet 5 05/15/2022   furosemide (LASIX) 20 MG tablet Take 20-40 mg by mouth daily as needed for fluid.   05/15/2022   gabapentin (NEURONTIN) 300 MG capsule Take 300 mg by mouth 2 (two) times daily.    05/15/2022   glimepiride (AMARYL) 4 MG tablet Take 4 mg by mouth daily.   05/15/2022   hydrALAZINE (APRESOLINE) 50 MG tablet Take 1 tablet (50 mg total) by mouth 3 (three) times daily. 270 tablet 3 05/15/2022   HYDROcodone-acetaminophen (NORCO/VICODIN) 5-325 MG tablet Take 1 tablet by mouth every 4 (four) hours as needed.   Past Week   insulin NPH Human  (NOVOLIN N RELION) 100 UNIT/ML injection Inject 0.5 mLs (50 Units total) into the skin every morning. And syringes 1/day 20 mL 11 05/15/2022   metFORMIN (GLUCOPHAGE) 500 MG tablet Take 500 mg by mouth 2 (two) times daily.   05/15/2022   mupirocin ointment (BACTROBAN) 2 % Apply 1 application topically daily as needed (melonoma).   unk   nitroGLYCERIN (NITROSTAT) 0.4 MG SL tablet Place 1 tablet (0.4 mg total) under the tongue every 5 (five) minutes as needed. (Patient taking differently: Place 0.4 mg under the tongue every 5 (five) minutes as needed for chest  pain.) 25 tablet 2 unk   rOPINIRole (REQUIP) 1 MG tablet Take 1 mg by mouth 4 (four) times daily.    05/15/2022   timolol (TIMOPTIC) 0.5 % ophthalmic solution INSTILL 1 DROP INTO EACH EYE TWICE DAILY (Patient taking differently: Place 1 drop into both eyes 2 (two) times daily.) 10 mL 10 05/15/2022   glucose blood (ONETOUCH ULTRA) test strip USE ONE TEST STRIP AS DIRECTED TWICE DAILY 100 each 0    oxyCODONE-acetaminophen (PERCOCET/ROXICET) 5-325 MG tablet Take 1-2 tablets by mouth every 4 (four) hours as needed for moderate pain. (Patient not taking: Reported on 05/16/2022) 10 tablet 0 Not Taking   UNIFINE PENTIPS 31G X 5 MM MISC USE TO INJECT INSULIN AT BEDTIME (Patient taking differently: at bedtime.) 100 each 5    Scheduled:  amLODipine  10 mg Oral Daily   atorvastatin  80 mg Oral Daily   carvedilol  12.5 mg Oral BID WC   DULoxetine  60 mg Oral Q lunch   ezetimibe  10 mg Oral Daily   furosemide  40 mg Intravenous Once   gabapentin  300 mg Oral BID   hydrALAZINE  50 mg Oral TID   hydrocortisone  25 mg Rectal BID   insulin aspart  0-15 Units Subcutaneous TID WC   insulin aspart  0-5 Units Subcutaneous QHS   insulin NPH Human  20 Units Subcutaneous BID AC & HS   lidocaine (PF)  10 mL Intradermal Once   polyethylene glycol  17 g Oral BID   rOPINIRole  1 mg Oral QID   senna-docusate  1 tablet Oral QHS   Continuous:  ciprofloxacin      metronidazole Stopped (05/16/22 0558)   JXB:JYNWGNFAOZ, fentaNYL (SUBLIMAZE) injection, labetalol, nitroGLYCERIN, ondansetron **OR** ondansetron (ZOFRAN) IV, oxyCODONE  No Known Allergies   ROS:  A comprehensive review of systems was negative except for: Gastrointestinal: positive for perirectal pain and swelling, constipation  Blood pressure (!) 176/80, pulse 96, temperature (!) 101 F (38.3 C), resp. rate (!) 34, height 5\' 11"  (1.803 m), weight 104.3 kg, SpO2 92 %. Physical Exam Vitals reviewed. Exam conducted with a chaperone present (wife in room).  HENT:     Head: Normocephalic.     Nose: Nose normal.  Eyes:     Extraocular Movements: Extraocular movements intact.  Cardiovascular:     Rate and Rhythm: Normal rate and regular rhythm.  Pulmonary:     Effort: Pulmonary effort is normal.     Breath sounds: Normal breath sounds.  Abdominal:     General: There is no distension.     Palpations: Abdomen is soft.     Tenderness: There is no abdominal tenderness.  Genitourinary:    Comments: Left posterior anal verge, fluctuant area with some tenderness  Musculoskeletal:        General: Normal range of motion.  Skin:    General: Skin is warm.  Neurological:     General: No focal deficit present.     Mental Status: He is alert and oriented to person, place, and time.  Psychiatric:        Mood and Affect: Mood normal.        Behavior: Behavior normal.        Thought Content: Thought content normal.     Results: Results for orders placed or performed during the hospital encounter of 05/16/22 (from the past 48 hour(s))  CBC with Differential     Status: Abnormal   Collection Time: 05/16/22  2:28 AM  Result Value Ref Range   WBC 9.8 4.0 - 10.5 K/uL   RBC 4.36 4.22 - 5.81 MIL/uL   Hemoglobin 12.3 (L) 13.0 - 17.0 g/dL   HCT 16.1 (L) 09.6 - 04.5 %   MCV 85.8 80.0 - 100.0 fL   MCH 28.2 26.0 - 34.0 pg   MCHC 32.9 30.0 - 36.0 g/dL   RDW 40.9 81.1 - 91.4 %   Platelets 218 150  - 400 K/uL   nRBC 0.0 0.0 - 0.2 %   Neutrophils Relative % 74 %   Neutro Abs 7.3 1.7 - 7.7 K/uL   Lymphocytes Relative 15 %   Lymphs Abs 1.4 0.7 - 4.0 K/uL   Monocytes Relative 10 %   Monocytes Absolute 1.0 0.1 - 1.0 K/uL   Eosinophils Relative 1 %   Eosinophils Absolute 0.1 0.0 - 0.5 K/uL   Basophils Relative 0 %   Basophils Absolute 0.0 0.0 - 0.1 K/uL   Immature Granulocytes 0 %   Abs Immature Granulocytes 0.02 0.00 - 0.07 K/uL    Comment: Performed at [facility], [address] [place], [place] [address]  Comprehensive metabolic panel     Status: Abnormal   Collection Time: 05/16/22  2:28 AM  Result Value Ref Range   Sodium 135 135 - 145 mmol/L   Potassium 3.9 3.5 - 5.1 mmol/L   Chloride 100 98 - 111 mmol/L   CO2 25 22 - 32 mmol/L   Glucose, Bld 188 (H) 70 - 99 mg/dL    Comment: Glucose reference range applies only to samples taken after fasting for at least 8 hours.   BUN 14 8 - 23 mg/dL   Creatinine, Ser 6.21 0.61 - 1.24 mg/dL   Calcium 8.7 (L) 8.9 - 10.3 mg/dL   Total Protein 6.9 6.5 - 8.1 g/dL   Albumin 3.4 (L) 3.5 - 5.0 g/dL   AST 20 15 - 41 U/L   ALT 24 0 - 44 U/L   Alkaline Phosphatase 74 38 - 126 U/L   Total Bilirubin 0.7 0.3 - 1.2 mg/dL   GFR, Estimated >30 >86 mL/min    Comment: (NOTE) Calculated using the CKD-EPI Creatinine Equation (2021)    Anion gap 10 5 - 15    Comment: Performed at [facility], [address], [place], [place] [address]  Hemoglobin A1c     Status: Abnormal   Collection Time: 05/16/22  2:28 AM  Result Value Ref Range   Hgb A1c MFr Bld 8.5 (H) 4.8 - 5.6 %    Comment: (NOTE) Pre diabetes:          5.7%-6.4%  Diabetes:              >6.4%  Glycemic control for   <7.0% adults with diabetes    Mean Plasma Glucose 197.25 mg/dL    Comment: Performed at [facility], [address], [place], [place] [address]  Urinalysis, Routine w reflex microscopic -Urine, Clean Catch     Status: Abnormal   Collection Time: 05/16/22  3:46 AM   Result Value Ref Range   Color, Urine YELLOW YELLOW   APPearance CLEAR CLEAR   Specific Gravity, Urine 1.018 1.005 - 1.030   pH 7.0 5.0 - 8.0   Glucose, UA 150 (A) NEGATIVE mg/dL   Hgb urine dipstick NEGATIVE NEGATIVE   Bilirubin Urine NEGATIVE NEGATIVE   Ketones, ur NEGATIVE NEGATIVE mg/dL   Protein, ur 30 (A) NEGATIVE mg/dL   Nitrite NEGATIVE NEGATIVE  Leukocytes,Ua NEGATIVE NEGATIVE   RBC / HPF 0-5 0 - 5 RBC/hpf   WBC, UA 0-5 0 - 5 WBC/hpf   Bacteria, UA NONE SEEN NONE SEEN   Squamous Epithelial / HPF 0-5 0 - 5 /HPF    Comment: Performed at [facility], [address] [place], [place] [address]  Culture, blood (Routine X 2) w Reflex to ID Panel     Status: None (Preliminary result)   Collection Time: 05/16/22  7:59 AM   Specimen: Left Antecubital; Blood  Result Value Ref Range   Specimen Description      LEFT ANTECUBITAL BOTTLES DRAWN AEROBIC AND ANAEROBIC   Special Requests      Blood Culture results may not be optimal due to an excessive volume of blood received in culture bottles   Culture      NO GROWTH < 12 HOURS Performed at [facility], [address] [place], [place] [address]    Report Status PENDING   Culture, blood (Routine X 2) w Reflex to ID Panel     Status: None (Preliminary result)   Collection Time: 05/16/22  8:06 AM   Specimen: BLOOD LEFT HAND  Result Value Ref Range   Specimen Description BLOOD LEFT HAND AEROBIC BOTTLE ONLY    Special Requests Blood Culture adequate volume    Culture      NO GROWTH < 12 HOURS Performed at [facility], [address] [place], [place] [address]    Report Status PENDING   CBG monitoring, ED     Status: Abnormal   Collection Time: 05/16/22  8:36 AM  Result Value Ref Range   Glucose-Capillary 166 (H) 70 - 99 mg/dL    Comment: Glucose reference range applies only to samples taken after fasting for at least 8 hours.  CBG monitoring, ED     Status: Abnormal   Collection Time: 05/16/22 12:06 PM  Result Value Ref  Range   Glucose-Capillary 151 (H) 70 - 99 mg/dL    Comment: Glucose reference range applies only to samples taken after fasting for at least 8 hours.  Glucose, capillary     Status: Abnormal   Collection Time: 05/16/22  4:34 PM  Result Value Ref Range   Glucose-Capillary 174 (H) 70 - 99 mg/dL    Comment: Glucose reference range applies only to samples taken after fasting for at least 8 hours.   Personally reviewed showed patient and family- perirectal fluid pocket left posterior  CT ABDOMEN PELVIS W CONTRAST  Result Date: 05/16/2022 CLINICAL DATA:  rectal pain, eval for proctitis vs prostatitis EXAM: CT ABDOMEN AND PELVIS WITH CONTRAST TECHNIQUE: Multidetector CT imaging of the abdomen and pelvis was performed using the standard protocol following bolus administration of intravenous contrast. RADIATION DOSE REDUCTION: This exam was performed according to the departmental dose-optimization program which includes automated exposure control, adjustment of the mA and/or kV according to patient size and/or use of iterative reconstruction technique. CONTRAST:  100mL OMNIPAQUE IOHEXOL 300 MG/ML  SOLN COMPARISON:  None Available. FINDINGS: Lower chest: No acute findings Hepatobiliary: No focal hepatic abnormality. Gallbladder unremarkable. Pancreas: No focal abnormality or ductal dilatation. Spleen: No focal abnormality.  Normal size. Adrenals/Urinary Tract: No adrenal abnormality. No focal renal abnormality. No stones or hydronephrosis. Urinary bladder is unremarkable. Stomach/Bowel: Stomach, stomach and small bowel decompressed. No bowel obstruction. Moderate stool burden. Crescent-shaped fluid collection noted along the posterior aspect and sides of the rectum measuring 4.5 x 3.8 cm compatible with perirectal abscess. Vascular/Lymphatic: Aortoiliac atherosclerosis. No  evidence of aneurysm or adenopathy. Reproductive: Mildly prominent prostate with central calcifications. Other: No free fluid or free air.  Musculoskeletal: No acute bony abnormality. IMPRESSION: Perirectal fluid collection along the posterior aspect and size of the rectum compatible with perirectal abscess. Moderate stool burden. No bowel obstruction. Aortoiliac atherosclerosis. Electronically Signed   By: Charlett Nose M.D.   On: 05/16/2022 03:33    Diagnosis: Perirectal abscess  Procedure: Incision and drainage of perirectal abscess Description: The area was palpated. I discussed with the patient and his wife risk of bleeding, infection, risk of the lidocaine not working on the infected skin, risk of having a fistula and having this issue again or needing more drainage. The area was cleaned with betadine. 1% Lidocaine was injected into the area. A scalpel was used to open the abscess cavity. Copious amounts of purulent fluid was evacuated. Cultures were obtained. Loculations were broken up with hemostats. Saline was used to irrigate. Iodoform was packed.   Assessment & Plan:  MAKOTO SELLITTO is a 72 y.o. male with a perirectal abscess. He tolerated I&D at the bedside. Purulence was drained.   -Diabetic diet now -Hold eliquis pending need for more drainage -IV antibiotics   All questions were answered to the satisfaction of the patient and family.   Lucretia Roers 05/16/2022, 4:42 PM

## 2022-05-16 NOTE — Progress Notes (Signed)
MEWS Progress Note  Patient Details Name: Xavier Caldwell MRN: [id number] DOB: 10/01/1950 Today's Date: 05/16/2022   MEWS Flowsheet Documentation:  Assess: MEWS Score Temp: 98.8 F (37.1 C) BP: (!) 183/98 MAP (mmHg): 121 Pulse Rate: 96 Resp: (!) 35 SpO2: 92 % O2 Device: Nasal Cannula O2 Flow Rate (L/min): 2 L/min Assess: MEWS Score MEWS Temp: 0 MEWS Systolic: 0 MEWS Pulse: 0 MEWS RR: 2 MEWS LOC: 0 MEWS Score: 2 MEWS Score Color: Yellow Assess: SIRS CRITERIA SIRS Temperature : 0 SIRS Respirations : 1 SIRS Pulse: 1 SIRS WBC: 0 SIRS Score Sum : 2 Assess: if the MEWS score is Yellow or Red Were vital signs taken at a resting state?: Yes Focused Assessment: Change from prior assessment (see assessment flowsheet) Does the patient meet 2 or more of the SIRS criteria?: No MEWS guidelines implemented : Yes, yellow Treat MEWS Interventions: Considered administering scheduled or prn medications/treatments as ordered Take Vital Signs Increase Vital Sign Frequency : Yellow: Q2hr x1, continue Q4hrs until patient remains green for 12hrs Escalate MEWS: Escalate: Yellow: Discuss with charge nurse and consider notifying provider and/or RRT Notify: Charge Nurse/RN Name of Charge Nurse/RN Notified: Craige Cotta, [facility] Provider Notification Provider Name/Title: Laural Benes, MD Date Provider Notified: 05/16/22 Time Provider Notified: 1630 Method of Notification: Face-to-face Notification Reason: Change in status Provider response: At bedside Date of Provider Response: 05/16/22 Time of Provider Response: 1620 Notify: Rapid Response Name of Rapid Response RN Notified: Mike Gip, RN [facility] Date Rapid Response Notified: 05/16/22 Time Rapid Response Notified: 1630    Dr. Laural Benes at bedside. Patient increased work of breathing and difficulty talking in complete sentences. New admit from ED. Wife at bedside stated that new respiratory effort is new upon admission. Patient states he is having  difficulty breathing. Acute oxygen place at 2L for 80%, increased slightly but bumped to 4L to obtain 92%. Temp now 101.6. Patient has significant cardiac history with MI with stent placement 4 years ago, low EF and chronic Afib- followed by Dr. Wyline Mood. Due to risk of deterioration and current respiratory status Dr. Laural Benes made decision to transfer patient to step-down. Patient will have portable chest x-ray and then be transferred for further care in stepdown.   Ellin Goodie 05/16/2022, 4:32 PM

## 2022-05-16 NOTE — ED Notes (Addendum)
No distress. No vomiting.  On cell p hone

## 2022-05-16 NOTE — ED Notes (Signed)
Pt was given lunch tray.  

## 2022-05-16 NOTE — H&P (Signed)
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  He has been taking laxatives and suppositories with no relief in symptoms.  He denies fever and chills.  He denies chest pain or shortness of breath.  He was sent for CT scan of the abdomen and pelvis which revealed a moderate-sized perirectal abscess.  He is on apixaban which is being held temporarily and surgery was consulted and recommended he be started on IV antibiotics and admitted.    Past Medical History:  Diagnosis Date   Acute systolic (congestive) heart failure    Anxiety    Arthritis    Atrial fibrillation    Back pain    Chronic kidney disease    pt told by PA of Dr. Fransico Him that he had "stage 3 kidney so they had me stop taking Metformin"   Coronary artery disease    Diabetes mellitus without complication    High cholesterol    History of gout    Hypertension    Myocardial infarction 04/2018   Neuropathy    PAD (peripheral artery disease)    Retinal detachment    One on the right and two on the left   Sleep apnea    Stop Bang score of 4   STEMI (ST elevation myocardial infarction)    04/08/2018 PCI/DES RCA   Stroke    February 2022   VT (ventricular tachycardia)     Past  Surgical History:  Procedure Laterality Date   APPENDECTOMY     CARDIOVERSION N/A 04/13/2018   Procedure: CARDIOVERSION;  Surgeon: Chilton Si, MD;  Location: [facility] ENDOSCOPY;  Service: Cardiovascular;  Laterality: N/A;   CATARACT EXTRACTION W/PHACO Right 04/25/2014   Procedure: CATARACT EXTRACTION PHACO AND INTRAOCULAR LENS PLACEMENT (IOC);  Surgeon: Susa Simmonds, MD;  Location: [facility] ORS;  Service: Ophthalmology;  Laterality: Right;  CDE:4.70   CATARACT EXTRACTION W/PHACO Left 07/11/2014   Procedure: CATARACT EXTRACTION PHACO AND INTRAOCULAR LENS PLACEMENT LEFT EYE CDE=8.32;  Surgeon: Susa Simmonds, MD;  Location: [facility] ORS;  Service: Ophthalmology;  Laterality: Left;   COLONOSCOPY WITH PROPOFOL N/A 09/23/2016   Rourk: 9 polyps removed, multiple tubular adenomas. recommended 3 yr surveillance   COLONOSCOPY WITH PROPOFOL N/A 12/27/2019   ten sessile polyps, 4-9 mm in size in descending, hepatic flexure, and cecum, 11 mm polyp in sigmoid, three sessile polyps in rectum and mid rectum 2-3 mm in size. Tubular adenomas and hyperplastic. 3 year surveillance   CORONARY/GRAFT ACUTE MI REVASCULARIZATION N/A 04/07/2018   Procedure: Coronary/Graft Acute MI Revascularization;  Surgeon: Kathleene Hazel, MD;  Location: [facility] INVASIVE CV LAB;  Service: Cardiovascular;  Laterality: N/A;   ENDARTERECTOMY Right 03/29/2020   Procedure: RIGHT CAROTID ARTERY ENDARTERECTOMY WITH PATCH ANGIOPLASTY;  Surgeon: Cephus Shelling, MD;  Location: [facility] OR;  Service: Vascular;  Laterality: Right;   ENDARTERECTOMY Left 09/11/2020   Procedure: ENDARTERECTOMY  CAROTID LEFT;  Surgeon: Cephus Shelling, MD;  Location: [facility] OR;  Service: Vascular;  Laterality: Left;   LEFT HEART CATH AND CORONARY ANGIOGRAPHY N/A 04/07/2018   Procedure: LEFT HEART CATH AND CORONARY ANGIOGRAPHY;  Surgeon: Kathleene Hazel, MD;  Location: [facility] INVASIVE CV LAB;  Service: Cardiovascular;  Laterality: N/A;   MELANOMA EXCISION  07/2019   PATCH  ANGIOPLASTY Left 09/11/2020   Procedure: PATCH ANGIOPLASTY;  Surgeon: Cephus Shelling, MD;  Location: [facility] OR;  Service: Vascular;  Laterality: Left;   POLYPECTOMY  09/23/2016   Procedure: POLYPECTOMY;  Surgeon: Corbin Ade, MD;  Location: [facility] ENDO SUITE;  Service: Endoscopy;;  colon   POLYPECTOMY  12/27/2019   Procedure: POLYPECTOMY;  Surgeon: Corbin Ade, MD;  Location: [facility] ENDO SUITE;  Service: Endoscopy;;   RETINAL DETACHMENT SURGERY       reports that he quit smoking about 9 years ago. His smoking use included cigarettes. He has a 45.00 pack-year smoking history. He has never been exposed to tobacco smoke. He has never used smokeless tobacco. He reports that he does not drink alcohol and does not use drugs.  No Known Allergies  Family History  Problem Relation Age of Onset   Hypertension Father    Colon cancer Neg Hx    Diabetes Neg Hx     Prior to Admission medications   Medication Sig Start Date End Date Taking? Authorizing Provider  ALPRAZolam Prudy Feeler) 0.5 MG tablet Take 0.5 mg by mouth 2 (two) times daily as needed for anxiety.   Yes [provider]  amLODipine (NORVASC) 10 MG tablet Take 1 tablet (10 mg total) by mouth daily. 03/11/20  Yes Erick Blinks, MD  apixaban (ELIQUIS) 5 MG TABS tablet Take 1 tablet (5 mg total) by mouth 2 (two) times daily. Restart medication tomorrow, 09/12/2020 09/12/20  Yes Setzer, Lynnell Jude, PA-C  atorvastatin (LIPITOR) 80 MG tablet TAKE ONE TABLET BY MOUTH EVERYDAY AT BEDTIME 02/28/22  Yes Branch, Dorothe Pea, MD  carvedilol (COREG) 12.5 MG tablet TAKE ONE TABLET BY MOUTH TWICE DAILY 11/26/21  Yes Branch, Dorothe Pea, MD  DULoxetine (CYMBALTA) 60 MG capsule Take 60 mg by mouth daily with lunch.    Yes [provider]  ezetimibe (ZETIA) 10 MG tablet Take 1 tablet (10 mg total) by mouth daily. 09/12/20 05/16/22 Yes Setzer, Lynnell Jude, PA-C  furosemide (LASIX) 20 MG tablet Take 20-40 mg by mouth daily as needed for fluid. 03/21/22  Yes  [provider]  gabapentin (NEURONTIN) 300 MG capsule Take 300 mg by mouth 2 (two) times daily.  02/23/19  Yes [provider]  glimepiride (AMARYL) 4 MG tablet Take 4 mg by mouth daily. 08/09/20  Yes [provider]  hydrALAZINE (APRESOLINE) 50 MG tablet Take 1 tablet (50 mg total) by mouth 3 (three) times daily. 07/30/21  Yes Branch, Dorothe Pea, MD  HYDROcodone-acetaminophen (NORCO/VICODIN) 5-325 MG tablet Take 1 tablet by mouth every 4 (four) hours as needed. 04/16/21  Yes [provider]  insulin NPH Human (NOVOLIN N RELION) 100 UNIT/ML injection Inject 0.5 mLs (50 Units total) into the skin every morning. And syringes 1/day 03/13/21  Yes Romero Belling, MD  metFORMIN (GLUCOPHAGE) 500 MG tablet Take 500 mg by mouth 2 (two) times daily. 05/03/22  Yes [provider]  mupirocin ointment (BACTROBAN) 2 % Apply 1 application topically daily as needed (melonoma). 05/31/20  Yes [provider]  nitroGLYCERIN (NITROSTAT) 0.4 MG SL tablet Place 1 tablet (0.4 mg total)  under the tongue every 5 (five) minutes as needed. Patient taking differently: Place 0.4 mg under the tongue every 5 (five) minutes as needed for chest pain. 03/09/19  Yes Branch, Dorothe Pea, MD  rOPINIRole (REQUIP) 1 MG tablet Take 1 mg by mouth 4 (four) times daily.    Yes [provider]  timolol (TIMOPTIC) 0.5 % ophthalmic solution INSTILL 1 DROP INTO EACH EYE TWICE DAILY Patient taking differently: Place 1 drop into both eyes 2 (two) times daily. 08/24/19  Yes Rankin, Alford Highland, MD  glucose blood (ONETOUCH ULTRA) test strip USE ONE TEST STRIP AS DIRECTED TWICE DAILY 07/29/18   Roma Kayser, MD  oxyCODONE-acetaminophen (PERCOCET/ROXICET) 5-325 MG tablet Take 1-2 tablets by mouth every 4 (four) hours as needed for moderate pain. Patient not taking: Reported on 05/16/2022 09/12/20   Setzer, Lynnell Jude, PA-C  UNIFINE PENTIPS 31G X 5 MM MISC USE TO INJECT INSULIN AT BEDTIME Patient taking  differently: at bedtime. 06/10/16   Roma Kayser, MD    Physical Exam: Vitals:   05/16/22 0400 05/16/22 0530 05/16/22 0600 05/16/22 0630  BP: (!) 162/77 (!) 169/68 (!) 172/68 (!) 163/81  Pulse: 78 68 85 77  Resp: 18 18 18 18   Temp:      TempSrc:      SpO2: 97% 94% 92% 96%  Weight:      Height:        Constitutional: frail, elderly, chronically ill appearing male, NAD, calm, comfortable Eyes: PERRL, lids and conjunctivae normal ENMT: Mucous membranes are moist. Posterior pharynx clear of any exudate or lesions.  Neck: normal, supple, no masses,  Respiratory: clear to auscultation bilaterally, no wheezing, no crackles. Normal respiratory effort. No accessory muscle use.  Cardiovascular: normal s1, s2 sounds, 1+ extremity edema. 2+ pedal pulses. No carotid bruits.  Abdomen: no tenderness, no masses palpated. No hepatosplenomegaly. Bowel sounds positive.  Musculoskeletal: no clubbing / cyanosis. No joint deformity upper and lower extremities. Good ROM, no contractures. Normal muscle tone.  Skin: no rashes, lesions, ulcers. No induration Neurologic: CN 2-12 grossly intact. Sensation intact, DTR normal. Strength 5/5 in all 4.  Psychiatric: Normal judgment and insight. Alert and oriented x 3. Normal mood.   Labs on Admission: I have personally reviewed following labs and imaging studies  CBC: Recent Labs  Lab 05/16/22 0228  WBC 9.8  NEUTROABS 7.3  HGB 12.3*  HCT 37.4*  MCV 85.8  PLT 218   Basic Metabolic Panel: Recent Labs  Lab 05/16/22 0228  NA 135  K 3.9  CL 100  CO2 25  GLUCOSE 188*  BUN 14  CREATININE 1.24  CALCIUM 8.7*   GFR: Estimated Creatinine Clearance: 69.1 mL/min (by C-G formula based on SCr of 1.24 mg/dL). Liver Function Tests: Recent Labs  Lab 05/16/22 0228  AST 20  ALT 24  ALKPHOS 74  BILITOT 0.7  PROT 6.9  ALBUMIN 3.4*   No results for input(s): "LIPASE", "AMYLASE" in the last 168 hours. No results for input(s): "AMMONIA" in the last  168 hours. Coagulation Profile: No results for input(s): "INR", "PROTIME" in the last 168 hours. Cardiac Enzymes: No results for input(s): "CKTOTAL", "CKMB", "CKMBINDEX", "TROPONINI" in the last 168 hours. BNP (last 3 results) No results for input(s): "PROBNP" in the last 8760 hours. HbA1C: No results for input(s): "HGBA1C" in the last 72 hours. CBG: No results for input(s): "GLUCAP" in the last 168 hours. Lipid Profile: No results for input(s): "CHOL", "HDL", "LDLCALC", "TRIG", "CHOLHDL", "LDLDIRECT" in the last  72 hours. Thyroid Function Tests: No results for input(s): "TSH", "T4TOTAL", "FREET4", "T3FREE", "THYROIDAB" in the last 72 hours. Anemia Panel: No results for input(s): "VITAMINB12", "FOLATE", "FERRITIN", "TIBC", "IRON", "RETICCTPCT" in the last 72 hours. Urine analysis:    Component Value Date/Time   COLORURINE YELLOW 05/16/2022 0346   APPEARANCEUR CLEAR 05/16/2022 0346   LABSPEC 1.018 05/16/2022 0346   PHURINE 7.0 05/16/2022 0346   GLUCOSEU 150 (A) 05/16/2022 0346   HGBUR NEGATIVE 05/16/2022 0346   BILIRUBINUR NEGATIVE 05/16/2022 0346   KETONESUR NEGATIVE 05/16/2022 0346   PROTEINUR 30 (A) 05/16/2022 0346   NITRITE NEGATIVE 05/16/2022 0346   LEUKOCYTESUR NEGATIVE 05/16/2022 0346    Radiological Exams on Admission: CT ABDOMEN PELVIS W CONTRAST  Result Date: 05/16/2022 CLINICAL DATA:  rectal pain, eval for proctitis vs prostatitis EXAM: CT ABDOMEN AND PELVIS WITH CONTRAST TECHNIQUE: Multidetector CT imaging of the abdomen and pelvis was performed using the standard protocol following bolus administration of intravenous contrast. RADIATION DOSE REDUCTION: This exam was performed according to the departmental dose-optimization program which includes automated exposure control, adjustment of the mA and/or kV according to patient size and/or use of iterative reconstruction technique. CONTRAST:  100mL OMNIPAQUE IOHEXOL 300 MG/ML  SOLN COMPARISON:  None Available. FINDINGS:  Lower chest: No acute findings Hepatobiliary: No focal hepatic abnormality. Gallbladder unremarkable. Pancreas: No focal abnormality or ductal dilatation. Spleen: No focal abnormality.  Normal size. Adrenals/Urinary Tract: No adrenal abnormality. No focal renal abnormality. No stones or hydronephrosis. Urinary bladder is unremarkable. Stomach/Bowel: Stomach, stomach and small bowel decompressed. No bowel obstruction. Moderate stool burden. Crescent-shaped fluid collection noted along the posterior aspect and sides of the rectum measuring 4.5 x 3.8 cm compatible with perirectal abscess. Vascular/Lymphatic: Aortoiliac atherosclerosis. No evidence of aneurysm or adenopathy. Reproductive: Mildly prominent prostate with central calcifications. Other: No free fluid or free air. Musculoskeletal: No acute bony abnormality. IMPRESSION: Perirectal fluid collection along the posterior aspect and size of the rectum compatible with perirectal abscess. Moderate stool burden. No bowel obstruction. Aortoiliac atherosclerosis. Electronically Signed   By: Charlett NoseKevin  Dover M.D.   On: 05/16/2022 03:33     Assessment/Plan Principal Problem:   Perirectal abscess Active Problems:   Hyperlipidemia associated with type 2 diabetes mellitus   Essential hypertension, benign   Taking multiple medications for chronic disease   Constipation   Hemorrhoids   Coronary artery disease involving native coronary artery of native heart with unstable angina pectoris   Presence of drug coated stent in right coronary artery   Ventricular tachycardia   Status post coronary artery stent placement   Paroxysmal atrial fibrillation   Mixed hyperlipidemia   Carotid stenosis   PVD (peripheral vascular disease)   Diabetes   Chronic anticoagulation   Hyperthyroidism   History of retinal detachment    Perirectal Abscess - appreciate surgery team consultation - obtain blood cultures - continue IV ciprofloxacin/metronidazole - Dr. Henreitta LeberBridges to  review CT scan and give recommendations - continue symptomatic management  Type 2 DM with retinal and vascular complications, uncontrolled - update A1c - hold morning NPH while he is NPO - SSI coverage and frequent CBG monitoring ordered  - hypoglycemia precautions ordered  Essential hypertension - suboptimally controlled - resume home BP meds - follow BP and adjust as needed - adding prn Hydralazine   Paroxysmal Atrial Fibrillation  - holding home apixaban in case OR treatment is needed - resumed home carvedilol 12.5 mg BID for rate control   HFrEF - Echo 2020:  LVEF 45-50% with  LV diffuse hypokinesis, mildly reduced RV systolic function, severe hypokinesis of LV basal to mid inferior and inferoseptal - He appears euvolemic, resuming his home cardiac medications   Hyperlipidemia - resume home atorvastatin 80 mg daily   CAD - He is followed by Dr. Wyline Mood - resume all of his home cardiac medications, except holding apixaban   Chronic constipation  - he has been started on a regular bowel regimen   H/o Ventricular tachycardia  - resume home carvedilol 12.5 mg BID    DVT prophylaxis: SCD  Code Status: full   Family Communication:   Disposition Plan: anticipate home   Consults called: surgery   Admission status: OBS  Level of care: Med-Surg Standley Dakins MD [facility] How to contact the [facility] Attending or Consulting provider 7A - 7P or covering provider during after hours 7P -7A, for this patient?  Check the care team in [facility] and look for a) attending/consulting [facility] provider listed and b) the [facility] team listed Log into [email or web address] and use [place]'s universal password to access. If you do not have the password, please contact the hospital operator. Locate the [facility] provider you are looking for under [facility] and page to a number that you can be directly reached. If you still have difficulty reaching the provider, please page the [facility] (Director on Call)  for the Hospitalists listed on amion for assistance.   If 7PM-7AM, please contact night-coverage [email or web address] Password [facility]  05/16/2022, 7:57 AM

## 2022-05-16 NOTE — Hospital Course (Signed)
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  He has been taking laxatives and suppositories with no relief in symptoms.  He denies fever and chills.  He denies chest pain or shortness of breath.  He was sent for CT scan of the abdomen and pelvis which revealed a moderate-sized perirectal abscess.  He is on apixaban which is being held temporarily and surgery was consulted and recommended he be started on IV antibiotics and admitted.

## 2022-05-16 NOTE — ED Notes (Signed)
Pt eating lunch.   No distress.  Pt states he feels much better

## 2022-05-16 NOTE — Progress Notes (Addendum)
05/16/2022 4:34 PM  Called to see patient as he arrived to unit breathing 40 times per min with increasing oxygen requirement.  He denies CP but reports 40+ year smoking history and has to sit up to sleep.  Place on cardiac monitor.  Will check pCXR, IV lasix x 1 dose, transfer to SDU. Echo was in 2020 will get updated Echo for comparison.  Temp up to 101 now post I&D will get repeat BC x 2 now that he is febrile.  Continue IV antibiotics.    Maryln Manuel MD  How to contact the [facility] Attending or Consulting provider 7A - 7P or covering provider during after hours 7P -7A, for this patient?  Check the care team in [facility] and look for a) attending/consulting TR[facility] provider listed and b) the [facility] team listed Log into [email or web address] and use [place]'s universal password to access. If you do not have the password, please contact the hospital operator. Locate the [facility] provider you are looking for under [facility] and page to a number that you can be directly reached. If you still have difficulty reaching the provider, please page the [facility] (Director on Call) for the Hospitalists listed on amion for assistance.

## 2022-05-16 NOTE — ED Notes (Signed)
Patient is resting comfortably.awaiting lunch and adm bed

## 2022-05-16 NOTE — ED Triage Notes (Signed)
Pt c/o constipation for the past few days with increased rectal pain. Pt has tried laxatives and suppositories with no relief.

## 2022-05-16 NOTE — ED Provider Notes (Signed)
[place] EMERGENCY DEPARTMENT AT [facility] Provider Note   CSN: [id number] Arrival date & time: 05/16/22  0049     History  Chief Complaint  Patient presents with   Constipation    Xavier Caldwell is a 72 y.o. male.  About a weeks worth of symptoms that progressively worsened to the point came to sit down because he has severe rectal pain.  Also constipated because everything has a bowel movement it hurts really bad.  He feels there is a bunch of feces stuck and it will not come out.  Matter what he does it does not get better.  No fevers.  No nausea or vomiting.  No other associated symptoms.   Constipation      Home Medications Prior to Admission medications   Medication Sig Start Date End Date Taking? Authorizing Provider  ALPRAZolam Prudy Feeler) 0.5 MG tablet Take 0.5 mg by mouth 2 (two) times daily as needed for anxiety.    [provider]  amLODipine (NORVASC) 10 MG tablet Take 1 tablet (10 mg total) by mouth daily. 03/11/20   Erick Blinks, MD  apixaban (ELIQUIS) 5 MG TABS tablet Take 1 tablet (5 mg total) by mouth 2 (two) times daily. Restart medication tomorrow, 09/12/2020 09/12/20   Setzer, Lynnell Jude, PA-C  atorvastatin (LIPITOR) 80 MG tablet TAKE ONE TABLET BY MOUTH EVERYDAY AT BEDTIME 02/28/22   Antoine Poche, MD  carvedilol (COREG) 12.5 MG tablet TAKE ONE TABLET BY MOUTH TWICE DAILY 11/26/21   Antoine Poche, MD  DULoxetine (CYMBALTA) 60 MG capsule Take 60 mg by mouth daily with lunch.     [provider]  ergocalciferol (VITAMIN D2) 50000 units capsule Take 50,000 Units by mouth every Sunday.     [provider]  ezetimibe (ZETIA) 10 MG tablet Take 1 tablet (10 mg total) by mouth daily. 09/12/20 05/15/22  Setzer, Lynnell Jude, PA-C  gabapentin (NEURONTIN) 300 MG capsule Take 300 mg by mouth 2 (two) times daily.  02/23/19   [provider]  glimepiride (AMARYL) 4 MG tablet Take 4 mg by mouth daily. 08/09/20   [provider]  glucose blood (ONETOUCH ULTRA) test strip USE ONE TEST STRIP AS DIRECTED TWICE DAILY 07/29/18   Roma Kayser, MD  hydrALAZINE (APRESOLINE) 50 MG tablet Take 1 tablet (50 mg total) by mouth 3 (three) times daily. 07/30/21   Antoine Poche, MD  HYDROcodone-acetaminophen (NORCO/VICODIN) 5-325 MG tablet Take 1 tablet by mouth every 4 (four) hours as needed. 04/16/21   [provider]  insulin NPH Human (NOVOLIN N RELION) 100 UNIT/ML injection Inject 0.5 mLs (50 Units total) into the skin every morning. And syringes 1/day 03/13/21   Romero Belling, MD  mupirocin ointment (BACTROBAN) 2 % Apply 1 application topically daily as needed (melonoma). 05/31/20   [provider]  nitroGLYCERIN (NITROSTAT) 0.4 MG SL tablet Place 1 tablet (0.4 mg total) under the tongue every 5 (five) minutes as needed. Patient taking differently: Place 0.4 mg under the tongue every 5 (five) minutes as needed for chest pain. 03/09/19   Antoine Poche, MD  oxyCODONE-acetaminophen (PERCOCET/ROXICET) 5-325 MG tablet Take 1-2 tablets by mouth every 4 (four) hours as needed for moderate pain. 09/12/20   Setzer, Lynnell Jude, PA-C  rOPINIRole (REQUIP) 1 MG tablet Take 1 mg by mouth 4 (four) times daily.     [provider]  timolol (TIMOPTIC) 0.5 % ophthalmic solution INSTILL 1 DROP INTO EACH EYE TWICE DAILY  Patient taking differently: Place 1 drop into both eyes 2 (two) times daily. 08/24/19   Rankin, Alford HighlandGary A, MD  UNIFINE PENTIPS 31G X 5 MM MISC USE TO INJECT INSULIN AT BEDTIME Patient taking differently: at bedtime. 06/10/16   Roma KayserNida, Gebreselassie W, MD      Allergies    Patient has no known allergies.    Review of Systems   Review of Systems  Gastrointestinal:  Positive for constipation.    Physical Exam Updated Vital Signs BP (!) 162/77   Pulse 78   Temp 97.9 F (36.6 C) (Oral)   Resp 18   Ht 5\' 11"  (1.803 m)   Wt 104.3 kg   SpO2 97%   BMI 32.08 kg/m  Physical Exam Vitals and nursing  note reviewed.  Constitutional:      Appearance: He is well-developed.  HENT:     Head: Normocephalic and atraumatic.  Eyes:     Pupils: Pupils are equal, round, and reactive to light.  Cardiovascular:     Rate and Rhythm: Normal rate.  Pulmonary:     Effort: Pulmonary effort is normal. No respiratory distress.  Abdominal:     General: There is no distension.  Genitourinary:    Comments: Diffuse rectal ttp, no impaction Musculoskeletal:        General: Normal range of motion.     Cervical back: Normal range of motion.  Neurological:     Mental Status: He is alert.     ED Results / Procedures / Treatments   Labs (all labs ordered are listed, but only abnormal results are displayed) Labs Reviewed  CBC WITH DIFFERENTIAL/PLATELET - Abnormal; Notable for the following components:      Result Value   Hemoglobin 12.3 (*)    HCT 37.4 (*)    All other components within normal limits  COMPREHENSIVE METABOLIC PANEL - Abnormal; Notable for the following components:   Glucose, Bld 188 (*)    Calcium 8.7 (*)    Albumin 3.4 (*)    All other components within normal limits  URINALYSIS, ROUTINE W REFLEX MICROSCOPIC - Abnormal; Notable for the following components:   Glucose, UA 150 (*)    Protein, ur 30 (*)    All other components within normal limits  URINE CULTURE    EKG None  Radiology CT ABDOMEN PELVIS W CONTRAST  Result Date: 05/16/2022 CLINICAL DATA:  rectal pain, eval for proctitis vs prostatitis EXAM: CT ABDOMEN AND PELVIS WITH CONTRAST TECHNIQUE: Multidetector CT imaging of the abdomen and pelvis was performed using the standard protocol following bolus administration of intravenous contrast. RADIATION DOSE REDUCTION: This exam was performed according to the departmental dose-optimization program which includes automated exposure control, adjustment of the mA and/or kV according to patient size and/or use of iterative reconstruction technique. CONTRAST:  100mL OMNIPAQUE  IOHEXOL 300 MG/ML  SOLN COMPARISON:  None Available. FINDINGS: Lower chest: No acute findings Hepatobiliary: No focal hepatic abnormality. Gallbladder unremarkable. Pancreas: No focal abnormality or ductal dilatation. Spleen: No focal abnormality.  Normal size. Adrenals/Urinary Tract: No adrenal abnormality. No focal renal abnormality. No stones or hydronephrosis. Urinary bladder is unremarkable. Stomach/Bowel: Stomach, stomach and small bowel decompressed. No bowel obstruction. Moderate stool burden. Crescent-shaped fluid collection noted along the posterior aspect and sides of the rectum measuring 4.5 x 3.8 cm compatible with perirectal abscess. Vascular/Lymphatic: Aortoiliac atherosclerosis. No evidence of aneurysm or adenopathy. Reproductive: Mildly prominent prostate with central calcifications. Other: No free fluid or free air. Musculoskeletal: No acute bony abnormality.  IMPRESSION: Perirectal fluid collection along the posterior aspect and size of the rectum compatible with perirectal abscess. Moderate stool burden. No bowel obstruction. Aortoiliac atherosclerosis. Electronically Signed   By: Charlett Nose M.D.   On: 05/16/2022 03:33    Procedures Procedures    Medications Ordered in ED Medications  hydrocortisone (ANUSOL-HC) suppository 25 mg (25 mg Rectal Given 05/16/22 0227)  HYDROmorphone (DILAUDID) injection 1 mg (0 mg Intravenous Hold 05/16/22 0254)  metroNIDAZOLE (FLAGYL) IVPB 500 mg (500 mg Intravenous New Bag/Given 05/16/22 0459)  iohexol (OMNIPAQUE) 300 MG/ML solution 100 mL (100 mLs Intravenous Contrast Given 05/16/22 0314)  ciprofloxacin (CIPRO) IVPB 400 mg (0 mg Intravenous Stopped 05/16/22 0458)    ED Course/ Medical Decision Making/ A&P                             Medical Decision Making Amount and/or Complexity of Data Reviewed Labs: ordered. Radiology: ordered.  Risk Prescription drug management. Decision regarding hospitalization.   CT scan with moderate sized  perirectal abscess. Discussed with Dr. Henreitta Leber with surgery. Suggests holding eliquis and she will see in AM. Abx started. TRH consulted for admission.   Final Clinical Impression(s) / ED Diagnoses Final diagnoses:  Perirectal abscess    Rx / DC Orders ED Discharge Orders     None         Neha Waight, Barbara Cower, MD 05/16/22 [phone]

## 2022-05-17 ENCOUNTER — Observation Stay (HOSPITAL_BASED_OUTPATIENT_CLINIC_OR_DEPARTMENT_OTHER): Payer: Medicare HMO

## 2022-05-17 ENCOUNTER — Other Ambulatory Visit (HOSPITAL_COMMUNITY): Payer: Self-pay | Admitting: *Deleted

## 2022-05-17 DIAGNOSIS — I4891 Unspecified atrial fibrillation: Secondary | ICD-10-CM

## 2022-05-17 DIAGNOSIS — R0602 Shortness of breath: Secondary | ICD-10-CM | POA: Diagnosis not present

## 2022-05-17 DIAGNOSIS — I255 Ischemic cardiomyopathy: Secondary | ICD-10-CM

## 2022-05-17 DIAGNOSIS — I2510 Atherosclerotic heart disease of native coronary artery without angina pectoris: Secondary | ICD-10-CM | POA: Diagnosis not present

## 2022-05-17 DIAGNOSIS — E1169 Type 2 diabetes mellitus with other specified complication: Secondary | ICD-10-CM | POA: Diagnosis not present

## 2022-05-17 DIAGNOSIS — I25110 Atherosclerotic heart disease of native coronary artery with unstable angina pectoris: Secondary | ICD-10-CM | POA: Diagnosis not present

## 2022-05-17 DIAGNOSIS — K611 Rectal abscess: Secondary | ICD-10-CM | POA: Diagnosis not present

## 2022-05-17 DIAGNOSIS — Z7901 Long term (current) use of anticoagulants: Secondary | ICD-10-CM | POA: Diagnosis not present

## 2022-05-17 DIAGNOSIS — I480 Paroxysmal atrial fibrillation: Secondary | ICD-10-CM | POA: Diagnosis not present

## 2022-05-17 DIAGNOSIS — E785 Hyperlipidemia, unspecified: Secondary | ICD-10-CM | POA: Diagnosis not present

## 2022-05-17 LAB — CULTURE, BLOOD (ROUTINE X 2)
Culture: NO GROWTH
Special Requests: ADEQUATE
Special Requests: ADEQUATE

## 2022-05-17 LAB — BASIC METABOLIC PANEL
Anion gap: 5 (ref 5–15)
BUN: 16 mg/dL (ref 8–23)
CO2: 27 mmol/L (ref 22–32)
Calcium: 8.2 mg/dL — ABNORMAL LOW (ref 8.9–10.3)
Chloride: 99 mmol/L (ref 98–111)
Creatinine, Ser: 1.28 mg/dL — ABNORMAL HIGH (ref 0.61–1.24)
GFR, Estimated: >60 mL/min (ref >60)
Glucose, Bld: 167 mg/dL — ABNORMAL HIGH (ref 70–99)
Potassium: 4 mmol/L (ref 3.5–5.1)
Sodium: 131 mmol/L — ABNORMAL LOW (ref 135–145)

## 2022-05-17 LAB — CBC
HCT: 35.6 % — ABNORMAL LOW (ref 39.0–52.0)
Hemoglobin: 11.4 g/dL — ABNORMAL LOW (ref 13.0–17.0)
MCH: 27.9 pg (ref 26.0–34.0)
MCHC: 32 g/dL (ref 30.0–36.0)
MCV: 87.3 fL (ref 80.0–100.0)
Platelets: 204 10*3/uL (ref 150–400)
RBC: 4.08 MIL/uL — ABNORMAL LOW (ref 4.22–5.81)
RDW: 14.6 % (ref 11.5–15.5)
WBC: 10.1 10*3/uL (ref 4.0–10.5)
nRBC: 0 % (ref 0.0–0.2)

## 2022-05-17 LAB — ECHOCARDIOGRAM COMPLETE
AR max vel: 2.15 cm2
AV Area VTI: 2.27 cm2
AV Area mean vel: 2.5 cm2
AV Mean grad: 3.1 mmHg
AV Peak grad: 5.7 mmHg
Ao pk vel: 1.2 m/s
Area-P 1/2: 5.27 cm2
Height: 71 in
S' Lateral: 2.7 cm
Weight: 3661.4 oz

## 2022-05-17 LAB — URINE CULTURE: Culture: NO GROWTH

## 2022-05-17 LAB — GLUCOSE, CAPILLARY
Glucose-Capillary: 169 mg/dL — ABNORMAL HIGH (ref 70–99)
Glucose-Capillary: 139 mg/dL — ABNORMAL HIGH (ref 70–99)
Glucose-Capillary: 228 mg/dL — ABNORMAL HIGH (ref 70–99)

## 2022-05-17 LAB — MAGNESIUM: Magnesium: 1.9 mg/dL (ref 1.7–2.4)

## 2022-05-17 LAB — HIV ANTIBODY (ROUTINE TESTING W REFLEX): HIV Screen 4th Generation wRfx: NONREACTIVE

## 2022-05-17 MED ORDER — POLYETHYLENE GLYCOL 3350 17 G PO PACK: 17.0000 g | PACK | Freq: Two times a day (BID) | ORAL | 1 refills | Status: DC

## 2022-05-17 MED ORDER — AMOXICILLIN-POT CLAVULANATE 875-125 MG PO TABS: 1.0000 | ORAL_TABLET | Freq: Two times a day (BID) | ORAL | 0 refills | Status: AC

## 2022-05-17 MED ORDER — CARVEDILOL 12.5 MG PO TABS
12.5000 mg | ORAL_TABLET | Freq: Two times a day (BID) | ORAL | Status: DC
  Administered 2022-05-17: 12.5 mg via ORAL
  Filled 2022-05-17: qty 1

## 2022-05-17 NOTE — Progress Notes (Signed)
[facility]  Feeling better.  Iodoform has fallen out. He wants to go home. Blood cultures negative to date.   BP (!) 156/73   Pulse 75   Temp 98 F (36.7 C) (Oral)   Resp (!) 23   Ht 5\' 11"  (1.803 m)   Wt 103.8 kg   SpO2 96%   BMI 31.92 kg/m  Perirectal abscess soft and minimal drainage. Soft, minor bleeding  Patient s/p I&D of perirectal abscess. Doing well. Home with antibiotics Sitz baths Will see 4/17 Return precautions given Wants to return to work 4/14 and wrote a note    Algis Greenhouse, MD [facility] [address] [place] [place] [place], [place] [address] [phone] (office)

## 2022-05-17 NOTE — Consult Note (Signed)
Cardiology Consultation   Patient ID: Xavier Caldwell MRN: [id number]; DOB: [birth year]  Admit date: 05/16/2022 Date of Consult: 05/17/2022  PCP:  Elfredia Nevins, MD   [facility] Providers Cardiologist:  Dina Rich, MD   {     Patient Profile:   Xavier Caldwell is a 72 y.o. 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 CT imaging showed perirectal abscess, admitted by hospitalist team with surgery consultation.   Yesterday episode of respiratory distress, tachypneic to 40 with HRs to 140 and SBP 180s-200s. Transffered to ICU. Patient denies any chest pains. Reports compliance with meds. Given IV lasix  x 1, complete resolution of symptoms this morning.      WBC 9.8 Hgb 12.3 Plt 218 K 3.9 BUN 14 Cr 1.24 Hgb A1c 8.5  EKG afib, IVCD, PVCs CXR possible mild edema CT A/P: Perirectal fluid collection along the posterior aspect and size of the rectum compatible with perirectal abscess.   04/2018 echo: LVE 45-50%,  Past Medical History:  Diagnosis Date   Acute systolic (congestive) heart failure    Anxiety    Arthritis    Atrial fibrillation    Back pain    Chronic kidney disease    pt told by PA of Dr. Fransico Him that he had "stage 3 kidney so they had me stop taking Metformin"   Coronary artery disease    Diabetes mellitus without complication    High cholesterol    History of gout    Hypertension    Myocardial infarction 04/2018   Neuropathy    PAD  (peripheral artery disease)    Retinal detachment    One on the right and two on the left   Sleep apnea    Stop Bang score of 4   STEMI (ST elevation myocardial infarction)    04/08/2018 PCI/DES RCA   Stroke    February 2022   VT (ventricular tachycardia)     Past Surgical History:  Procedure Laterality Date   APPENDECTOMY     CARDIOVERSION N/A 04/13/2018   Procedure: CARDIOVERSION;  Surgeon: Chilton Si, MD;  Location[facility] ENDOSCOPY;  Service: Cardiovascular;  Laterality: N/A;   CATARACT EXTRACTION W/PHACO Right 04/25/2014   Procedure: CATARACT EXTRACTION PHACO AND INTRAOCULAR LENS PLACEMENT (IOC);  Surgeon: Susa Simmonds, MD;  Location: [facility] ORS;  Service: Ophthalmology;  Laterality: Right;  CDE:4.70   CATARACT EXTRACTION W/PHACO Left 07/11/2014   Procedure: CATARACT EXTRACTION PHACO AND INTRAOCULAR LENS PLACEMENT LEFT EYE CDE=8.32;  Surgeon: Susa Simmonds, MD;  Location: [facility] ORS;  Service: Ophthalmology;  Laterality: Left;   COLONOSCOPY WITH PROPOFOL N/A 09/23/2016   Rourk: 9 polyps removed, multiple tubular adenomas. recommended 3 yr surveillance   COLONOSCOPY WITH PROPOFOL N/A 12/27/2019   ten sessile polyps, 4-9 mm in size in descending, hepatic flexure, and cecum, 11 mm polyp in sigmoid, three sessile polyps in rectum and mid rectum 2-3 mm in size. Tubular adenomas and hyperplastic. 3 year  surveillance   CORONARY/GRAFT ACUTE MI REVASCULARIZATION N/A 04/07/2018   Procedure: Coronary/Graft Acute MI Revascularization;  Surgeon: Kathleene Hazel, MD;  Location: [facility] INVASIVE CV LAB;  Service: Cardiovascular;  Laterality: N/A;   ENDARTERECTOMY Right 03/29/2020   Procedure: RIGHT CAROTID ARTERY ENDARTERECTOMY WITH PATCH ANGIOPLASTY;  Surgeon: Cephus Shelling, MD;  Location: [facility] OR;  Service: Vascular;  Laterality: Right;   ENDARTERECTOMY Left 09/11/2020   Procedure: ENDARTERECTOMY CAROTID LEFT;  Surgeon: Cephus Shelling, MD;  Location: [facility] OR;  Service: Vascular;  Laterality:  Left;   LEFT HEART CATH AND CORONARY ANGIOGRAPHY N/A 04/07/2018   Procedure: LEFT HEART CATH AND CORONARY ANGIOGRAPHY;  Surgeon: Kathleene Hazel, MD;  Location: [facility] INVASIVE CV LAB;  Service: Cardiovascular;  Laterality: N/A;   MELANOMA EXCISION  07/2019   PATCH ANGIOPLASTY Left 09/11/2020   Procedure: PATCH ANGIOPLASTY;  Surgeon: Cephus Shelling, MD;  Location: [facility] OR;  Service: Vascular;  Laterality: Left;   POLYPECTOMY  09/23/2016   Procedure: POLYPECTOMY;  Surgeon: Corbin Ade, MD;  Location: [facility] ENDO SUITE;  Service: Endoscopy;;  colon   POLYPECTOMY  12/27/2019   Procedure: POLYPECTOMY;  Surgeon: Corbin Ade, MD;  Location: [facility] ENDO SUITE;  Service: Endoscopy;;   RETINAL DETACHMENT SURGERY        Inpatient Medications: Scheduled Meds:  amLODipine  10 mg Oral Daily   atorvastatin  80 mg Oral Daily   carvedilol  12.5 mg Oral BID WC   Chlorhexidine Gluconate Cloth  6 each Topical Daily   DULoxetine  60 mg Oral Q lunch   ezetimibe  10 mg Oral Daily   furosemide  40 mg Oral Daily   gabapentin  300 mg Oral BID   hydrALAZINE  50 mg Oral TID   hydrocortisone  25 mg Rectal BID   insulin aspart  0-15 Units Subcutaneous TID WC   insulin aspart  0-5 Units Subcutaneous QHS   insulin NPH Human  10 Units Subcutaneous BID AC & HS   lidocaine (PF)  10 mL Intradermal Once   polyethylene glycol  17 g Oral BID   rOPINIRole  1 mg Oral QID   senna-docusate  1 tablet Oral QHS   Continuous Infusions:  ciprofloxacin 200 mL/hr at 05/17/22 0427   metronidazole Stopped (05/17/22 0340)   PRN Meds: acetaminophen, ALPRAZolam, fentaNYL (SUBLIMAZE) injection, labetalol, nitroGLYCERIN, ondansetron **OR** ondansetron (ZOFRAN) IV, oxyCODONE  Allergies:   No Known Allergies  Social History:   Social History   Socioeconomic History   Marital status: Married    Spouse name: Not on file   Number of children: Not on file   Years of education: Not on file   Highest education level: Not on  file  Occupational History   Not on file  Tobacco Use   Smoking status: Former    Packs/day: 1.50    Years: 30.00    Additional pack years: 0.00    Total pack years: 45.00    Types: Cigarettes    Quit date: 07/05/2012    Years since quitting: 9.8    Passive exposure: Never   Smokeless tobacco: Never  Vaping Use   Vaping Use: Never used  Substance and Sexual Activity   Alcohol use: No   Drug use: No   Sexual activity: Not Currently    Birth control/protection: None  Other Topics Concern   Not on file  Social History Narrative   Not on file   Social Determinants of Health   Financial Resource Strain:  Low Risk  (04/05/2022)   Overall Financial Resource Strain (CARDIA)    Difficulty of Paying Living Expenses: Not hard at all  Food Insecurity: Not on file  Transportation Needs: No Transportation Needs (04/05/2022)   PRAPARE - Administrator, Civil Service (Medical): No    Lack of Transportation (Non-Medical): No  Physical Activity: Not on file  Stress: Not on file  Social Connections: Socially Integrated (12/07/2021)   Social Connection and Isolation Panel [NHANES]    Frequency of Communication with Friends and Family: More than three times a week    Frequency of Social Gatherings with Friends and Family: More than three times a week    Attends Religious Services: More than 4 times per year    Active Member of [employer] or Organizations: Yes    Attends Engineer, structural: More than 4 times per year    Marital Status: Married  Catering manager Violence: Not on file    Family History:    Family History  Problem Relation Age of Onset   Hypertension Father    Colon cancer Neg Hx    Diabetes Neg Hx      ROS:  Please see the history of present illness.   All other ROS reviewed and negative.     Physical Exam/Data:   Vitals:   05/17/22 0500 05/17/22 0608 05/17/22 0700 05/17/22 0800  BP:  139/78 (!) 153/75 (!) 136/58  Pulse:  79 71 77  Resp:  (!) 27  14 19   Temp:      TempSrc:      SpO2:  99% 97% 97%  Weight: 103.8 kg     Height:        Intake/Output Summary (Last 24 hours) at 05/17/2022 0906 Last data filed at 05/17/2022 0427 Gross per 24 hour  Intake 553.54 ml  Output 330 ml  Net 223.54 ml      05/17/2022    5:00 AM 05/16/2022    1:33 AM 04/04/2022    7:57 AM  Last 3 Weights  Weight (lbs) 228 lb 13.4 oz 230 lb 229 lb 6.4 oz  Weight (kg) 103.8 kg 104.327 kg 104.055 kg     Body mass index is 31.92 kg/m.  General:  Well nourished, well developed, in no acute distress HEENT: normal Neck: no JVD Vascular: No carotid bruits; Distal pulses 2+ bilaterally Cardiac:  irreg Lungs:  clear to auscultation bilaterally, no wheezing, rhonchi or rales  Abd: soft, nontender, no hepatomegaly  Ext: no edema Musculoskeletal:  No deformities, BUE and BLE strength normal and equal Skin: warm and dry  Neuro:  CNs 2-12 intact, no focal abnormalities noted Psych:  Normal affect     Laboratory Data:  High Sensitivity Troponin:  No results for input(s): "TROPONINIHS" in the last 720 hours.   Chemistry Recent Labs  Lab 05/16/22 0228 05/17/22 0444  NA 135 131*  K 3.9 4.0  CL 100 99  CO2 25 27  GLUCOSE 188* 167*  BUN 14 16  CREATININE 1.24 1.28*  CALCIUM 8.7* 8.2*  MG  --  1.9  GFRNONAA >60 >60  ANIONGAP 10 5    Recent Labs  Lab 05/16/22 0228  PROT 6.9  ALBUMIN 3.4*  AST 20  ALT 24  ALKPHOS 74  BILITOT 0.7   Lipids No results for input(s): "CHOL", "TRIG", "HDL", "LABVLDL", "LDLCALC", "CHOLHDL" in the last 168 hours.  Hematology Recent Labs  Lab 05/16/22 0228 05/17/22 0444  WBC 9.8 10.1  RBC 4.36  4.08*  HGB 12.3* 11.4*  HCT 37.4* 35.6*  MCV 85.8 87.3  MCH 28.2 27.9  MCHC 32.9 32.0  RDW 14.6 14.6  PLT 218 204   Thyroid No results for input(s): "TSH", "FREET4" in the last 168 hours.  BNPNo results for input(s): "BNP", "PROBNP" in the last 168 hours.  DDimer No results for input(s): "DDIMER" in the last 168  hours.   Radiology/Studies:  DG CHEST PORT 1 VIEW  Result Date: 05/16/2022 CLINICAL DATA:  Tachypnea EXAM: PORTABLE CHEST 1 VIEW COMPARISON:  12/26/2021 FINDINGS: Transverse diameter of heart is increased. There are no signs of alveolar pulmonary edema. There is slight prominence of interstitial markings in the parahilar regions and lower lung fields. There is minimal blunting of right lateral CP angle. There is no pneumothorax. IMPRESSION: Cardiomegaly. There is prominence of interstitial markings in the parahilar regions and lower lung fields suggesting possible mild interstitial edema. There is no focal pulmonary consolidation. Electronically Signed   By: Ernie Avena M.D.   On: 05/16/2022 16:51   CT ABDOMEN PELVIS W CONTRAST  Result Date: 05/16/2022 CLINICAL DATA:  rectal pain, eval for proctitis vs prostatitis EXAM: CT ABDOMEN AND PELVIS WITH CONTRAST TECHNIQUE: Multidetector CT imaging of the abdomen and pelvis was performed using the standard protocol following bolus administration of intravenous contrast. RADIATION DOSE REDUCTION: This exam was performed according to the departmental dose-optimization program which includes automated exposure control, adjustment of the mA and/or kV according to patient size and/or use of iterative reconstruction technique. CONTRAST:  OMNIPAQUE IOHEXOL 300 MG/ML  SOLN COMPARISON:  None Available. FINDINGS: Lower chest: No acute findings Hepatobiliary: No focal hepatic abnormality. Gallbladder unremarkable. Pancreas: No focal abnormality or ductal dilatation. Spleen: No focal abnormality.  Normal size. Adrenals/Urinary Tract: No adrenal abnormality. No focal renal abnormality. No stones or hydronephrosis. Urinary bladder is unremarkable. Stomach/Bowel: Stomach, stomach and small bowel decompressed. No bowel obstruction. Moderate stool burden. Crescent-shaped fluid collection noted along the posterior aspect and sides of the rectum measuring 4.5 x 3.8 cm  compatible with perirectal abscess. Vascular/Lymphatic: Aortoiliac atherosclerosis. No evidence of aneurysm or adenopathy. Reproductive: Mildly prominent prostate with central calcifications. Other: No free fluid or free air. Musculoskeletal: No acute bony abnormality. IMPRESSION: Perirectal fluid collection along the posterior aspect and size of the rectum compatible with perirectal abscess. Moderate stool burden. No bowel obstruction. Aortoiliac atherosclerosis. Electronically Signed   By: Charlett Nose M.D.   On: 05/16/2022 03:33     Assessment and Plan:   1.Afib - home regimen coreg 12.5mg  bid, eliquis. Has been rate controlled afib as outpatient - eliquis on hold due to perirectal abscess and potential need for surgical intervention, restart when ok from surgical standpoint.    2. CAD - prior inferior STEMI 04/2020 with DES to RCA complicated by VT arrest,   3. SOB - episode yesterday around 430pm of increased SOB. From chart review SBP 180 to 200, afib rates up to 140, fever around that time 101.  -CXR suggesting edema - given IV lasix 40mg  x 1.  - HRs, bps normal this morning, He is breathing comfortably on room air - would appear to be flash pulmonary edema, unclear if triggered by afib or HTN or if both were simply in response to the respiratory distress.  - agree with oral lasix 40mg  daily, f/u echo.    4.Perirectal abscess - management per primary team and surgery - s/p bedside I&D, he is on abx   For questions or updates, please contact  [facility] Please consult [email or web address] for contact info under    Signed, Dina Rich, MD  05/17/2022 9:06 AM

## 2022-05-17 NOTE — Progress Notes (Signed)
*  PRELIMINARY RESULTS* Echocardiogram 2D Echocardiogram has been performed.  Xavier Caldwell 05/17/2022, 11:27 AM

## 2022-05-17 NOTE — Discharge Instructions (Addendum)
Post Operative Instructions after Perirectal abscess -Keep your stools soft and have a BM daily. Take fiber (Metamucil) over the counter daily. Take Colace twice daily if fiber does not keep your stools soft.  Take Miralax as needed if you still have not had a BM in 2 Days. -Take Sitz Baths (warm water baths) after every BM and when having discomfort.  -Take Ibuprofen and Tylenol alternating and the Narcotic pain medication for breakthrough pain. -Some bleeding is normal, but if you have excessive bleeding, fevers, chills, or more pain than prior, call or go to the ED. -Wear a pad for drainage as needed.   Contact Information: If you have questions or concerns, please call our office, [phone], Monday- Thursday 8AM-5PM and Friday 8AM-12Noon.  If it is after hours or on the weekend, please call [facility] Main Number, [phone], [phone], and ask to speak to the surgeon on call for Dr. Henreitta Leber at [facility].

## 2022-05-17 NOTE — Evaluation (Signed)
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  He has been taking laxatives and suppositories with no relief in symptoms.  He denies fever and chills.  He denies chest pain or shortness of breath.  He was sent for CT scan of the abdomen and pelvis which revealed a moderate-sized perirectal abscess.  He is on apixaban which is being held temporarily and surgery was consulted and recommended he be started on IV antibiotics and admitted.   Clinical Impression  Patient completes session on RA with O2 sat in low to mid 90s with mobility. Patient does not require assist for bed mobility and demonstrates good sitting balance at EOB. He is unsteady upon initial standing and for first few steps when ambulating without AD. Balance improves with RW and patient able to ambulate several laps in ICU without assist. Patient returned to room at end of session. Patient discharged to care of nursing for ambulation daily as tolerated for length of stay.      Recommendations for follow up therapy are one component of a multi-disciplinary discharge planning process, led by the attending physician.  Recommendations may be updated based on patient status, additional functional criteria and insurance authorization.  Follow Up Recommendations       Assistance Recommended at Discharge PRN  Patient can return home with the  following       Equipment Recommendations None recommended by PT  Recommendations for Other Services       Functional Status Assessment Patient has had a recent decline in their functional status and demonstrates the ability to make significant improvements in function in a reasonable and predictable amount of time.     Precautions / Restrictions Precautions Precautions: Fall Restrictions Weight Bearing Restrictions: No      Mobility  Bed Mobility Overal bed mobility: Modified Independent             General bed mobility comments: increased time    Transfers Overall transfer level: Needs assistance Equipment used: Rolling walker (2 wheels) Transfers: Sit to/from Stand Sit to Stand: Min guard           General transfer comment: min guard for unsteadiness upon initial standing    Ambulation/Gait Ambulation/Gait assistance: Modified independent (Device/Increase time) Gait Distance (Feet): 450 Feet Assistive device: Rolling walker (2 wheels) Gait Pattern/deviations: Step-through pattern, Decreased stride length, Trunk flexed Gait velocity: decreased     General Gait Details: unsteadiness without RW with first few steps, balance improves with RW and used for remainder of session  Stairs            Wheelchair Mobility    Modified Rankin (Stroke Patients Only)       Balance Overall balance assessment: Mild deficits observed, not formally tested                                           Pertinent Vitals/Pain Pain Assessment Pain Assessment: No/denies pain  Home Living Family/patient expects to be discharged to:: Private residence Living Arrangements: Spouse/significant other Available Help at Discharge: Family Type of Home: House Home Access: Stairs to enter Entrance Stairs-Rails: Right Entrance Stairs-Number of Steps: 2   Home Layout: One level Home Equipment: Rollator (4 wheels);Cane - single point;Shower seat;Grab  bars - tub/shower      Prior Function Prior Level of Function : Independent/Modified Independent;Working/employed;Driving             Mobility Comments: community ambulation without AD, 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 ADLs Comments: independent     Higher education careers adviser        Extremity/Trunk Assessment   Upper Extremity Assessment Upper Extremity Assessment: Overall WFL for tasks assessed    Lower Extremity Assessment Lower Extremity Assessment: Overall WFL for tasks assessed    Cervical / Trunk Assessment Cervical / Trunk Assessment: Normal  Communication   Communication: No difficulties  Cognition Arousal/Alertness: Awake/alert Behavior During Therapy: WFL for tasks assessed/performed Overall Cognitive Status: Within Functional Limits for tasks assessed                                          General Comments      Exercises     Assessment/Plan    PT Assessment Patient does not need any further PT services  PT Problem List         PT Treatment Interventions      PT Goals (Current goals can be found in the Care Plan section)  Acute Rehab PT Goals Patient Stated Goal: return home PT Goal Formulation: With patient Time For Goal Achievement: 05/17/22 Potential to Achieve Goals: Good    Frequency       Co-evaluation               AM-PAC PT "6 Clicks" Mobility  Outcome Measure Help needed turning from your back to your side while in a flat bed without using bedrails?: None Help needed moving from lying on your back to sitting on the side of a flat bed without using bedrails?: None Help needed moving to and from a bed to a chair (including a wheelchair)?: None Help needed standing up from a chair using your arms (e.g., wheelchair or bedside chair)?: None Help needed to walk in hospital room?: None Help needed climbing 3-5 steps with a railing? : A Little 6 Click Score: 23    End of Session    Activity Tolerance: Patient tolerated treatment well Patient left: in bed;with call bell/phone within reach;with family/visitor present Nurse Communication: Mobility status PT Visit Diagnosis: Unsteadiness on feet (R26.81);Other abnormalities of gait and mobility (R26.89)    Time: 6579-0383 PT Time Calculation (min) (ACUTE ONLY): 26 min   Charges:   PT Evaluation $PT Eval Low Complexity: 1 Low PT Treatments $Therapeutic Activity: 8-22 mins        10:14 AM, 05/17/22 Wyman Songster PT, DPT Physical Therapist at [facility]

## 2022-05-17 NOTE — Care Management Obs Status (Signed)
MEDICARE OBSERVATION STATUS NOTIFICATION   Patient Details  Name: Xavier Caldwell MRN: [id number] Date of Birth: 1950/02/23   Medicare Observation Status Notification Given:  Yes    Annice Needy, LCSW 05/17/2022, 1:08 PM

## 2022-05-17 NOTE — Discharge Summary (Signed)
Physician Discharge Summary  COBE VINEY ZOX:[id number] DOB: 12-18-1950 DOA: 05/16/2022  PCP: Elfredia Nevins, MD Surgeon: Dr. Henreitta Leber Cardiologist: Dr. Wyline Mood  Admit date: 05/16/2022 Discharge date: 05/17/2022  Admitted From:  Home  Disposition: Home   Recommendations for Outpatient Follow-up:  Follow up with Dr. Henreitta Leber on 05/22/22 as scheduled Follow up with PCP in 2 weeks Follow up with cardiology in 4-6 weeks   Discharge Condition: STABLE   CODE STATUS: FULL DIET: Heart healthy carb modified    Brief Hospitalization Summary: Please see all hospital notes, images, labs for full details of the hospitalization. 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  He has been taking laxatives and suppositories with no relief in symptoms.  He denies fever and chills.  He denies chest pain or shortness of breath.  He was sent for CT scan of the abdomen and pelvis which revealed a moderate-sized perirectal abscess.  He is on apixaban which is being held temporarily and surgery was consulted and recommended he be started on IV antibiotics and admitted.     HOSPITAL COURSE BY PROBLEM   Perirectal Abscess -  TREATED  - appreciate surgery team consultation - obtain blood cultures - NO GROWTH TO DATE - treated with IV ciprofloxacin/metronidazole - Dr. Henreitta Leber did bedside I&D on 4/11, pt tolerated well;  - wound healing nicely, per surgery ok to DC home today - DC on oral augmentin to complete full 10 day course of treatment - follow up with Dr. Henreitta Leber on  05/22/22 for recheck   Type 2 DM with retinal and vascular complications, uncontrolled - update A1c - 8.5%  - resume home NPH  - CBG (last 3)  Recent Labs    05/17/22 0307 05/17/22 0730 05/17/22 1133  GLUCAP 169* 139* 228*    Essential hypertension - suboptimally controlled - resume home BP meds - follow BP and adjust as needed outpatient    Paroxysmal Atrial Fibrillation  - held home apixaban temporarily for procedure - Dr. Henreitta Leber said can resume apixaban at discharge - resumed home carvedilol 12.5 mg BID for rate control  - pt had brief run of Afib RVR associated with fever after procedure - fortunately HR has stabilized and he is back on his home dose carvedilol - he was seen by cardiology team   HFrEF - Echo 2020:  LVEF 45-50% with LV diffuse hypokinesis, mildly reduced RV systolic function, severe hypokinesis of LV basal to mid inferior and inferoseptal - He had episode of flash pulm edema after procedure, given IV lasix with good results, feeling back to baseline - repeated Echo completed: LVEF improved to 55-60% moderate LVH   Hyperlipidemia - resume home atorvastatin 80 mg daily    CAD - He is followed by Dr. Wyline Mood - resume all of his home cardiac medications at discharge    Chronic constipation  - he has been started on a regular bowel regimen    H/o Ventricular tachycardia  - resume home carvedilol 12.5 mg BID    Discharge Diagnoses:  Principal Problem:   Perirectal abscess Active Problems:   Hyperlipidemia associated with type 2 diabetes mellitus   Essential hypertension, benign   Taking  multiple medications for chronic disease   Constipation   Hemorrhoids   Coronary artery disease involving native coronary artery of native heart with unstable angina pectoris   Presence of drug coated stent in right coronary artery   Ventricular tachycardia   Status post coronary artery stent placement   Paroxysmal atrial fibrillation   Mixed hyperlipidemia    Carotid stenosis   PVD (peripheral vascular disease)   Diabetes   Chronic anticoagulation   Hyperthyroidism   History of retinal detachment   COPD (chronic obstructive pulmonary disease)   Former smoker   Discharge Instructions:  Allergies as of 05/17/2022   No Known Allergies      Medication List     STOP taking these medications    oxyCODONE-acetaminophen 5-325 MG tablet Commonly known as: PERCOCET/ROXICET       TAKE these medications    ALPRAZolam 0.5 MG tablet Commonly known as: XANAX Take 0.5 mg by mouth 2 (two) times daily as needed for anxiety.   amLODipine 10 MG tablet Commonly known as: NORVASC Take 1 tablet (10 mg total) by mouth daily.   amoxicillin-clavulanate 875-125 MG tablet Commonly known as: AUGMENTIN Take 1 tablet by mouth 2 (two) times daily for 8 days.   apixaban 5 MG Tabs tablet Commonly known as: Eliquis Take 1 tablet (5 mg total) by mouth 2 (two) times daily. Restart medication tomorrow, 09/12/2020   atorvastatin 80 MG tablet Commonly known as: LIPITOR TAKE ONE TABLET BY MOUTH EVERYDAY AT BEDTIME   carvedilol 12.5 MG tablet Commonly known as: COREG TAKE ONE TABLET BY MOUTH TWICE DAILY   DULoxetine 60 MG capsule Commonly known as: CYMBALTA Take 60 mg by mouth daily with lunch.   ezetimibe 10 MG tablet Commonly known as: Zetia Take 1 tablet (10 mg total) by mouth daily.   furosemide 20 MG tablet Commonly known as: LASIX Take 20-40 mg by mouth daily as needed for fluid.   gabapentin 300 MG capsule Commonly known as: NEURONTIN Take 300 mg by mouth 2 (two) times daily.   glimepiride 4 MG tablet Commonly known as: AMARYL Take 4 mg by mouth daily.   hydrALAZINE 50 MG tablet Commonly known as: APRESOLINE Take 1 tablet (50 mg total) by mouth 3 (three) times daily.   HYDROcodone-acetaminophen 5-325 MG tablet Commonly known as: NORCO/VICODIN Take 1 tablet by mouth every 4 (four) hours as needed.   insulin NPH Human 100 UNIT/ML  injection Commonly known as: NovoLIN N ReliOn Inject 0.5 mLs (50 Units total) into the skin every morning. And syringes 1/day   metFORMIN 500 MG tablet Commonly known as: GLUCOPHAGE Take 500 mg by mouth 2 (two) times daily.   mupirocin ointment 2 % Commonly known as: BACTROBAN Apply 1 application topically daily as needed (melonoma).   nitroGLYCERIN 0.4 MG SL tablet Commonly known as: Nitrostat Place 1 tablet (0.4 mg total) under the tongue every 5 (five) minutes as needed. What changed: reasons to take this   OneTouch Ultra test strip Generic drug: glucose blood USE ONE TEST STRIP AS DIRECTED TWICE DAILY   polyethylene glycol 17 g packet Commonly known as: MIRALAX / GLYCOLAX Take 17 g by mouth 2 (two) times daily.   rOPINIRole 1 MG tablet Commonly known as: REQUIP Take 1 mg by mouth 4 (four) times daily.   timolol 0.5 % ophthalmic solution Commonly known as: TIMOPTIC INSTILL 1 DROP INTO EACH EYE TWICE DAILY What changed: See the new instructions.   Unifine Pentips 31G X 5 MM Misc Generic  drug: Insulin Pen Needle USE TO INJECT INSULIN AT BEDTIME What changed: See the new instructions.        Follow-up Information     Lucretia Roers, MD Follow up on 05/22/2022.   Specialty: General Surgery Why: office will call with time Contact information: [address] [place] [facility] [address] [phone]         Elfredia Nevins, MD. Schedule an appointment as soon as possible for a visit in 2 week(s).   Specialty: Internal Medicine Why: Hospital Follow Up Contact information: [address] [place] [address] [phone]         [facility]. Schedule an appointment as soon as possible for a visit in 1 month(s).   Specialty: Cardiology Why: Hospital Follow Up Contact information: [address] [id number] mc [place] [address] [phone]               No Known Allergies Allergies as of  05/17/2022   No Known Allergies      Medication List     STOP taking these medications    oxyCODONE-acetaminophen 5-325 MG tablet Commonly known as: PERCOCET/ROXICET       TAKE these medications    ALPRAZolam 0.5 MG tablet Commonly known as: XANAX Take 0.5 mg by mouth 2 (two) times daily as needed for anxiety.   amLODipine 10 MG tablet Commonly known as: NORVASC Take 1 tablet (10 mg total) by mouth daily.   amoxicillin-clavulanate 875-125 MG tablet Commonly known as: AUGMENTIN Take 1 tablet by mouth 2 (two) times daily for 8 days.   apixaban 5 MG Tabs tablet Commonly known as: Eliquis Take 1 tablet (5 mg total) by mouth 2 (two) times daily. Restart medication tomorrow, 09/12/2020   atorvastatin 80 MG tablet Commonly known as: LIPITOR TAKE ONE TABLET BY MOUTH EVERYDAY AT BEDTIME   carvedilol 12.5 MG tablet Commonly known as: COREG TAKE ONE TABLET BY MOUTH TWICE DAILY   DULoxetine 60 MG capsule Commonly known as: CYMBALTA Take 60 mg by mouth daily with lunch.   ezetimibe 10 MG tablet Commonly known as: Zetia Take 1 tablet (10 mg total) by mouth daily.   furosemide 20 MG tablet Commonly known as: LASIX Take 20-40 mg by mouth daily as needed for fluid.   gabapentin 300 MG capsule Commonly known as: NEURONTIN Take 300 mg by mouth 2 (two) times daily.   glimepiride 4 MG tablet Commonly known as: AMARYL Take 4 mg by mouth daily.   hydrALAZINE 50 MG tablet Commonly known as: APRESOLINE Take 1 tablet (50 mg total) by mouth 3 (three) times daily.   HYDROcodone-acetaminophen 5-325 MG tablet Commonly known as: NORCO/VICODIN Take 1 tablet by mouth every 4 (four) hours as needed.   insulin NPH Human 100 UNIT/ML injection Commonly known as: NovoLIN N ReliOn Inject 0.5 mLs (50 Units total) into the skin every morning. And syringes 1/day   metFORMIN 500 MG tablet Commonly known as: GLUCOPHAGE Take 500 mg by mouth 2 (two) times daily.   mupirocin ointment 2  % Commonly known as: BACTROBAN Apply 1 application topically daily as needed (melonoma).   nitroGLYCERIN 0.4 MG SL tablet Commonly known as: Nitrostat Place 1 tablet (0.4 mg total) under the tongue every 5 (five) minutes as needed. What changed: reasons to take this   OneTouch Ultra test strip Generic drug: glucose blood USE ONE TEST STRIP AS DIRECTED TWICE DAILY   polyethylene glycol 17 g packet Commonly known as: MIRALAX / GLYCOLAX Take  17 g by mouth 2 (two) times daily.   rOPINIRole 1 MG tablet Commonly known as: REQUIP Take 1 mg by mouth 4 (four) times daily.   timolol 0.5 % ophthalmic solution Commonly known as: TIMOPTIC INSTILL 1 DROP INTO EACH EYE TWICE DAILY What changed: See the new instructions.   Unifine Pentips 31G X 5 MM Misc Generic drug: Insulin Pen Needle USE TO INJECT INSULIN AT BEDTIME What changed: See the new instructions.        Procedures/Studies: ECHOCARDIOGRAM COMPLETE  Result Date: 05/17/2022    ECHOCARDIOGRAM REPORT   Patient Name:   Xavier Caldwell Date of Exam: 05/17/2022 Medical Rec #:  [id number]     Height:       71.0 in Accession #:    [id number]    Weight:       228.8 lb Date of Birth:  12/15/1950      BSA:          2.233 m Patient Age:    71 years      BP:           136/58 mmHg Patient Gender: M             HR:           77 bpm. 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 CAD                 and Previous Myocardial Infarction, COPD, Arrythmias:Atrial                 Fibrillation; Risk Factors:Former Smoker, Hypertension, Diabetes                 and Dyslipidemia. Chronic anticoagulation.  Sonographer:    Celesta Gentile RCS Referring Phys: [phone] Cythina Mickelsen L Nishi Neiswonger IMPRESSIONS  1. Left ventricular ejection fraction, by estimation, is 55 to 60%. The left ventricle has normal function. Left  ventricular endocardial border not optimally defined to evaluate regional wall motion. There is moderate left ventricular hypertrophy. Left ventricular diastolic parameters are indeterminate.  2. Right ventricular systolic function is normal. The right ventricular size is normal. Tricuspid regurgitation signal is inadequate for assessing PA pressure.  3. Left atrial size was severely dilated.  4. Right atrial size was severely dilated.  5. The mitral valve is normal in structure. No evidence of mitral valve regurgitation. No evidence of mitral stenosis.  6. The aortic valve is tricuspid. Aortic valve regurgitation is not visualized. No aortic stenosis is present.  7. The pulmonic valve was abnormal.  8. The inferior vena cava is dilated in size with <50% respiratory variability, suggesting right atrial pressure of 15 mmHg. FINDINGS  Left Ventricle: Left ventricular ejection fraction, by estimation, is 55 to 60%. The left ventricle has normal function. Left ventricular endocardial border not optimally defined to evaluate regional wall motion. The left ventricular internal cavity size was normal in size. There is moderate left ventricular hypertrophy. Left ventricular diastolic parameters are indeterminate. Right Ventricle: The right ventricular size is normal. Right vetricular wall thickness was not well visualized. Right ventricular systolic function is normal. Tricuspid regurgitation signal is inadequate for assessing PA pressure. Left Atrium: Left atrial size was severely dilated. Right Atrium: Right atrial size was severely dilated. Pericardium: Trivial pericardial effusion is present. The pericardial effusion is circumferential. Mitral Valve: The mitral  valve is normal in structure. No evidence of mitral valve regurgitation. No evidence of mitral valve stenosis. Tricuspid Valve: The tricuspid valve is normal in structure. Tricuspid valve regurgitation is not demonstrated. No evidence of tricuspid stenosis.  Aortic Valve: The aortic valve is tricuspid. Aortic valve regurgitation is not visualized. No aortic stenosis is present. Aortic valve mean gradient measures 3.1 mmHg. Aortic valve peak gradient measures 5.7 mmHg. Aortic valve area, by VTI measures 2.27 cm. Pulmonic Valve: The pulmonic valve was abnormal. Pulmonic valve regurgitation is mild. No evidence of pulmonic stenosis. Aorta: The aortic root is normal in size and structure. Venous: The inferior vena cava is dilated in size with less than 50% respiratory variability, suggesting right atrial pressure of 15 mmHg. IAS/Shunts: No atrial level shunt detected by color flow Doppler.  LEFT VENTRICLE PLAX 2D LVIDd:         4.60 cm LVIDs:         2.70 cm LV PW:         1.30 cm LV IVS:        1.40 cm LVOT diam:     2.00 cm LV SV:         60 LV SV Index:   27 LVOT Area:     3.14 cm  RIGHT VENTRICLE RV S prime:     14.60 cm/s TAPSE (M-mode): 2.0 cm LEFT ATRIUM              Index        RIGHT ATRIUM           Index LA diam:        3.40 cm  1.52 cm/m   RA Area:     28.00 cm LA Vol (A2C):   125.5 ml 56.21 ml/m  RA Volume:   100.00 ml 44.79 ml/m LA Vol (A4C):   123.0 ml 55.09 ml/m LA Biplane Vol: 112.0 ml 50.16 ml/m  AORTIC VALVE AV Area (Vmax):    2.15 cm AV Area (Vmean):   2.50 cm AV Area (VTI):     2.27 cm AV Vmax:           119.88 cm/s AV Vmean:          81.126 cm/s AV VTI:            0.263 m AV Peak Grad:      5.7 mmHg AV Mean Grad:      3.1 mmHg LVOT Vmax:         82.00 cm/s LVOT Vmean:        64.500 cm/s LVOT VTI:          0.190 m LVOT/AV VTI ratio: 0.72  AORTA Ao Root diam: 3.90 cm MITRAL VALVE MV Area (PHT): 5.27 cm     SHUNTS MV Decel Time: 144 msec     Systemic VTI:  0.19 m MV E velocity: 128.00 cm/s  Systemic Diam: 2.00 cm Dina Rich MD Electronically signed by Dina Rich MD Signature Date/Time: 05/17/2022/12:11:48 PM    Final    DG CHEST PORT 1 VIEW  Result Date: 05/16/2022 CLINICAL DATA:  Tachypnea EXAM: PORTABLE CHEST 1 VIEW COMPARISON:   12/26/2021 FINDINGS: Transverse diameter of heart is increased. There are no signs of alveolar pulmonary edema. There is slight prominence of interstitial markings in the parahilar regions and lower lung fields. There is minimal blunting of right lateral CP angle. There is no pneumothorax. IMPRESSION: Cardiomegaly. There is prominence of interstitial markings in the parahilar regions and  lower lung fields suggesting possible mild interstitial edema. There is no focal pulmonary consolidation. Electronically Signed   By: Ernie Avena M.D.   On: 05/16/2022 16:51   CT ABDOMEN PELVIS W CONTRAST  Result Date: 05/16/2022 CLINICAL DATA:  rectal pain, eval for proctitis vs prostatitis EXAM: CT ABDOMEN AND PELVIS WITH CONTRAST TECHNIQUE: Multidetector CT imaging of the abdomen and pelvis was performed using the standard protocol following bolus administration of intravenous contrast. RADIATION DOSE REDUCTION: This exam was performed according to the departmental dose-optimization program which includes automated exposure control, adjustment of the mA and/or kV according to patient size and/or use of iterative reconstruction technique. CONTRAST:  OMNIPAQUE IOHEXOL 300 MG/ML  SOLN COMPARISON:  None Available. FINDINGS: Lower chest: No acute findings Hepatobiliary: No focal hepatic abnormality. Gallbladder unremarkable. Pancreas: No focal abnormality or ductal dilatation. Spleen: No focal abnormality.  Normal size. Adrenals/Urinary Tract: No adrenal abnormality. No focal renal abnormality. No stones or hydronephrosis. Urinary bladder is unremarkable. Stomach/Bowel: Stomach, stomach and small bowel decompressed. No bowel obstruction. Moderate stool burden. Crescent-shaped fluid collection noted along the posterior aspect and sides of the rectum measuring 4.5 x 3.8 cm compatible with perirectal abscess. Vascular/Lymphatic: Aortoiliac atherosclerosis. No evidence of aneurysm or adenopathy. Reproductive: Mildly  prominent prostate with central calcifications. Other: No free fluid or free air. Musculoskeletal: No acute bony abnormality. IMPRESSION: Perirectal fluid collection along the posterior aspect and size of the rectum compatible with perirectal abscess. Moderate stool burden. No bowel obstruction. Aortoiliac atherosclerosis. Electronically Signed   By: Charlett Nose M.D.   On: 05/16/2022 03:33     Subjective: Pt reports feeling much better today wants to go home today.  No specific complaints.   Discharge Exam: Vitals:   05/17/22 1200 05/17/22 1300  BP: (!) 147/66 (!) 156/73  Pulse: 64 75  Resp: (!) 23   Temp:    SpO2: 94% 96%   Vitals:   05/17/22 1116 05/17/22 1130 05/17/22 1200 05/17/22 1300  BP: (!) 165/70  (!) 147/66 (!) 156/73  Pulse: 73  64 75  Resp: (!) 28  (!) 23   Temp:  98 F (36.7 C)    TempSrc:  Oral    SpO2: 97%  94% 96%  Weight:      Height:        General: Pt is alert, awake, not in acute distress Cardiovascular: irregularly irregular, S1/S2 +, no rubs, no gallops Respiratory: CTA bilaterally, no wheezing, no rhonchi Abdominal: Soft, NT, ND, bowel sounds + Extremities: trace pretibial lower extremity edema, no cyanosis   The results of significant diagnostics from this hospitalization (including imaging, microbiology, ancillary and laboratory) are listed below for reference.     Microbiology: Recent Results (from the past 240 hour(s))  Urine Culture     Status: None   Collection Time: 05/16/22  3:46 AM   Specimen: Urine, Clean Catch  Result Value Ref Range Status   Specimen Description   Final    URINE, CLEAN CATCH Performed at [facility], [address] [place], [place] [address]    Special Requests   Final    NONE Performed at [facility], [address] [place], [place] [address]    Culture   Final    NO GROWTH Performed at [facility], [address] [place], [place] [address]    Report Status 05/17/2022 FINAL  Final  Culture, blood  (Routine X 2) w Reflex to ID Panel     Status: None (Preliminary result)  Collection Time: 05/16/22  7:59 AM   Specimen: Left Antecubital; Blood  Result Value Ref Range Status   Specimen Description   Final    LEFT ANTECUBITAL BOTTLES DRAWN AEROBIC AND ANAEROBIC   Special Requests   Final    Blood Culture results may not be optimal due to an excessive volume of blood received in culture bottles   Culture   Final    NO GROWTH 1 DAY Performed at [facility], [address] [place], [place] [address]    Report Status PENDING  Incomplete  Culture, blood (Routine X 2) w Reflex to ID Panel     Status: None (Preliminary result)   Collection Time: 05/16/22  8:06 AM   Specimen: BLOOD LEFT HAND  Result Value Ref Range Status   Specimen Description BLOOD LEFT HAND AEROBIC BOTTLE ONLY  Final   Special Requests Blood Culture adequate volume  Final   Culture   Final    NO GROWTH 1 DAY Performed at [facility], [address] [place], [place] [address]    Report Status PENDING  Incomplete  Aerobic/Anaerobic Culture w Gram Stain (surgical/deep wound)     Status: None (Preliminary result)   Collection Time: 05/16/22  9:34 AM   Specimen: Perirectal  Result Value Ref Range Status   Specimen Description   Final    PERIRECTAL Performed at [facility], [address], [place], [place] [address]    Special Requests   Final    NONE Performed at [facility], [address] [place], [place] [address]    Gram Stain   Final    ABUNDANT WBC PRESENT, PREDOMINANTLY PMN FEW GRAM NEGATIVE RODS INTRACELLULAR    Culture   Final    CULTURE REINCUBATED FOR BETTER GROWTH Performed at [facility], [address] [place], [place] [address]    Report Status PENDING  Incomplete  MRSA Next Gen by PCR, Nasal     Status: None   Collection Time: 05/16/22  5:00 PM   Specimen: Nasal Mucosa; Nasal Swab  Result Value Ref Range Status   MRSA by PCR Next Gen NOT DETECTED NOT DETECTED Final    Comment:  (NOTE) The GeneXpert MRSA Assay (FDA approved for NASAL specimens only), is one component of a comprehensive MRSA colonization surveillance program. It is not intended to diagnose MRSA infection nor to guide or monitor treatment for MRSA infections. Test performance is not FDA approved in patients less than 5 years old. Performed at [facility], [address], [place], [place] [address]   Culture, blood (Routine X 2) w Reflex to ID Panel     Status: None (Preliminary result)   Collection Time: 05/16/22  5:05 PM   Specimen: BLOOD  Result Value Ref Range Status   Specimen Description BLOOD BLOOD LEFT ARM  Final   Special Requests   Final    BOTTLES DRAWN AEROBIC AND ANAEROBIC Blood Culture adequate volume   Culture   Final    NO GROWTH < 24 HOURS Performed at [facility], [address] [place], [place] [address]    Report Status PENDING  Incomplete  Culture, blood (Routine X 2) w Reflex to ID Panel     Status: None (Preliminary result)   Collection Time: 05/16/22  5:09 PM   Specimen: BLOOD  Result Value Ref Range Status   Specimen Description BLOOD BLOOD RIGHT ARM  Final   Special Requests   Final    BOTTLES DRAWN AEROBIC AND ANAEROBIC Blood  Culture adequate volume   Culture   Final    NO GROWTH < 24 HOURS Performed at [facility] [address], [place], [place] [address]    Report Status PENDING  Incomplete     Labs: BNP (last 3 results) No results for input(s): "BNP" in the last 8760 hours. Basic Metabolic Panel: Recent Labs  Lab 05/16/22 0228 05/17/22 0444  NA 135 131*  K 3.9 4.0  CL 100 99  CO2 25 27  GLUCOSE 188* 167*  BUN 14 16  CREATININE 1.24 1.28*  CALCIUM 8.7* 8.2*  MG  --  1.9   Liver Function Tests: Recent Labs  Lab 05/16/22 0228  AST 20  ALT 24  ALKPHOS 74  BILITOT 0.7  PROT 6.9  ALBUMIN 3.4*   No results for input(s): "LIPASE", "AMYLASE" in the last 168 hours. No results for input(s): "AMMONIA" in the last 168 hours. CBC: Recent Labs   Lab 05/16/22 0228 05/17/22 0444  WBC 9.8 10.1  NEUTROABS 7.3  --   HGB 12.3* 11.4*  HCT 37.4* 35.6*  MCV 85.8 87.3  PLT 218 204   Cardiac Enzymes: No results for input(s): "CKTOTAL", "CKMB", "CKMBINDEX", "TROPONINI" in the last 168 hours. BNP: Invalid input(s): "POCBNP" CBG: Recent Labs  Lab 05/16/22 1634 05/16/22 2256 05/17/22 0307 05/17/22 0730 05/17/22 1133  GLUCAP 174* 138* 169* 139* 228*   D-Dimer No results for input(s): "DDIMER" in the last 72 hours. Hgb A1c Recent Labs    05/16/22 0228  HGBA1C 8.5*   Lipid Profile No results for input(s): "CHOL", "HDL", "LDLCALC", "TRIG", "CHOLHDL", "LDLDIRECT" in the last 72 hours. Thyroid function studies No results for input(s): "TSH", "T4TOTAL", "T3FREE", "THYROIDAB" in the last 72 hours.  Invalid input(s): "FREET3" Anemia work up No results for input(s): "VITAMINB12", "FOLATE", "FERRITIN", "TIBC", "IRON", "RETICCTPCT" in the last 72 hours. Urinalysis    Component Value Date/Time   COLORURINE YELLOW 05/16/2022 0346   APPEARANCEUR CLEAR 05/16/2022 0346   LABSPEC 1.018 05/16/2022 0346   PHURINE 7.0 05/16/2022 0346   GLUCOSEU 150 (A) 05/16/2022 0346   HGBUR NEGATIVE 05/16/2022 0346   BILIRUBINUR NEGATIVE 05/16/2022 0346   KETONESUR NEGATIVE 05/16/2022 0346   PROTEINUR 30 (A) 05/16/2022 0346   NITRITE NEGATIVE 05/16/2022 0346   LEUKOCYTESUR NEGATIVE 05/16/2022 0346   Sepsis Labs Recent Labs  Lab 05/16/22 0228 05/17/22 0444  WBC 9.8 10.1   Microbiology Recent Results (from the past 240 hour(s))  Urine Culture     Status: None   Collection Time: 05/16/22  3:46 AM   Specimen: Urine, Clean Catch  Result Value Ref Range Status   Specimen Description   Final    URINE, CLEAN CATCH Performed at [facility], [address], [place], [place] [address]    Special Requests   Final    NONE Performed at [facility], [address], [place], [place] [address]    Culture   Final    NO GROWTH Performed at [facility], [address] [place], [place] [address]    Report Status 05/17/2022 FINAL  Final  Culture, blood (Routine X 2) w Reflex to ID Panel     Status: None (Preliminary result)   Collection Time: 05/16/22  7:59 AM   Specimen: Left Antecubital; Blood  Result Value Ref Range Status   Specimen Description   Final    LEFT ANTECUBITAL BOTTLES DRAWN AEROBIC AND ANAEROBIC   Special Requests   Final    Blood Culture results may not  be optimal due to an excessive volume of blood received in culture bottles   Culture   Final    NO GROWTH 1 DAY Performed at [facility], [address] [place], [place] [address]    Report Status PENDING  Incomplete  Culture, blood (Routine X 2) w Reflex to ID Panel     Status: None (Preliminary result)   Collection Time: 05/16/22  8:06 AM   Specimen: BLOOD LEFT HAND  Result Value Ref Range Status   Specimen Description BLOOD LEFT HAND AEROBIC BOTTLE ONLY  Final   Special Requests Blood Culture adequate volume  Final   Culture   Final    NO GROWTH 1 DAY Performed at [facility], [address] [place], [place] [address]    Report Status PENDING  Incomplete  Aerobic/Anaerobic Culture w Gram Stain (surgical/deep wound)     Status: None (Preliminary result)   Collection Time: 05/16/22  9:34 AM   Specimen: Perirectal  Result Value Ref Range Status   Specimen Description   Final    PERIRECTAL Performed at [facility], [address] [place], [place] [address]    Special Requests   Final    NONE Performed at [facility], [address], [place], [place] [address]    Gram Stain   Final    ABUNDANT WBC PRESENT, PREDOMINANTLY PMN FEW GRAM NEGATIVE RODS INTRACELLULAR    Culture   Final    CULTURE REINCUBATED FOR BETTER GROWTH Performed at [facility], [address] [place], [place] [address]    Report Status PENDING  Incomplete  MRSA Next Gen by PCR, Nasal     Status: None   Collection Time: 05/16/22  5:00 PM   Specimen: Nasal Mucosa;  Nasal Swab  Result Value Ref Range Status   MRSA by PCR Next Gen NOT DETECTED NOT DETECTED Final    Comment: (NOTE) The GeneXpert MRSA Assay (FDA approved for NASAL specimens only), is one component of a comprehensive MRSA colonization surveillance program. It is not intended to diagnose MRSA infection nor to guide or monitor treatment for MRSA infections. Test performance is not FDA approved in patients less than 24 years old. Performed at [facility], [address] [place], [place] [address]   Culture, blood (Routine X 2) w Reflex to ID Panel     Status: None (Preliminary result)   Collection Time: 05/16/22  5:05 PM   Specimen: BLOOD  Result Value Ref Range Status   Specimen Description BLOOD BLOOD LEFT ARM  Final   Special Requests   Final    BOTTLES DRAWN AEROBIC AND ANAEROBIC Blood Culture adequate volume   Culture   Final    NO GROWTH < 24 HOURS Performed at [facility], [address] [place], [place] [address]    Report Status PENDING  Incomplete  Culture, blood (Routine X 2) w Reflex to ID Panel     Status: None (Preliminary result)   Collection Time: 05/16/22  5:09 PM   Specimen: BLOOD  Result Value Ref Range Status   Specimen Description BLOOD BLOOD RIGHT ARM  Final   Special Requests   Final    BOTTLES DRAWN AEROBIC AND ANAEROBIC Blood Culture adequate volume   Culture   Final    NO GROWTH < 24 HOURS Performed at [facility], [address] [place], [place] [address]    Report Status PENDING  Incomplete    Time coordinating discharge:  38 mins   SIGNED:  Maylen Waltermire  Laural Benes, MD  [facility] 05/17/2022, 2:14 PM How to contact the [facility] Attending or Consulting provider 7A - 7P or covering provider during after hours 7P -7A, for this patient?  Check the care team in [facility] and look for a) attending/consulting [facility] provider listed and b) the [facility] team listed Log into [email or web address] and use [place]'s universal password to access. If you do not have the  password, please contact the hospital operator. Locate the [facility] provider you are looking for under [facility] and page to a number that you can be directly reached. If you still have difficulty reaching the provider, please page the [facility] (Director on Call) for the Hospitalists listed on amion for assistance.

## 2022-05-18 LAB — AEROBIC/ANAEROBIC CULTURE W GRAM STAIN (SURGICAL/DEEP WOUND)

## 2022-05-19 LAB — AEROBIC/ANAEROBIC CULTURE W GRAM STAIN (SURGICAL/DEEP WOUND)

## 2022-05-20 LAB — CULTURE, BLOOD (ROUTINE X 2): Culture: NO GROWTH

## 2022-05-20 LAB — AEROBIC/ANAEROBIC CULTURE W GRAM STAIN (SURGICAL/DEEP WOUND)

## 2022-05-21 LAB — AEROBIC/ANAEROBIC CULTURE W GRAM STAIN (SURGICAL/DEEP WOUND)

## 2022-05-22 ENCOUNTER — Ambulatory Visit (INDEPENDENT_AMBULATORY_CARE_PROVIDER_SITE_OTHER): Payer: Medicare HMO | Admitting: General Surgery

## 2022-05-22 ENCOUNTER — Encounter: Payer: Self-pay | Admitting: General Surgery

## 2022-05-22 VITALS — BP 158/74 | HR 74 | Temp 98.3°F | Resp 14 | Ht 71.0 in | Wt 230.0 lb

## 2022-05-22 DIAGNOSIS — R7989 Other specified abnormal findings of blood chemistry: Secondary | ICD-10-CM | POA: Diagnosis not present

## 2022-05-22 DIAGNOSIS — K611 Rectal abscess: Secondary | ICD-10-CM

## 2022-05-22 DIAGNOSIS — E042 Nontoxic multinodular goiter: Secondary | ICD-10-CM | POA: Diagnosis not present

## 2022-05-22 LAB — CULTURE, BLOOD (ROUTINE X 2): Culture: NO GROWTH

## 2022-05-22 NOTE — Progress Notes (Signed)
[facility]  No further drainage. Working and doing well. No fevers. Finishing up the antibiotics.  BP (!) 158/74   Pulse 74   Temp 98.3 F (36.8 C) (Oral)   Resp 14   Ht 5\' 11"  (1.803 m)   Wt 230 lb (104.3 kg)   SpO2 96%   BMI 32.08 kg/m  Healed I&D site and no induration or drainage  Patient s/p I&D perirectal abscess. Discussed risk of fistula if this recurs.   Algis Greenhouse, MD [facility] [address] [place], [place] [address] [phone] (office)

## 2022-05-23 LAB — TSH: TSH: 0.601 u[IU]/mL (ref 0.450–4.500)

## 2022-05-23 LAB — T3, FREE: T3, Free: 3.1 pg/mL (ref 2.0–4.4)

## 2022-05-23 LAB — T4, FREE: Free T4: 1.31 ng/dL (ref 0.82–1.77)

## 2022-05-24 ENCOUNTER — Other Ambulatory Visit: Payer: Self-pay | Admitting: Cardiology

## 2022-05-27 DIAGNOSIS — Z6832 Body mass index (BMI) 32.0-32.9, adult: Secondary | ICD-10-CM | POA: Diagnosis not present

## 2022-05-27 DIAGNOSIS — K610 Anal abscess: Secondary | ICD-10-CM | POA: Diagnosis not present

## 2022-05-27 DIAGNOSIS — I130 Hypertensive heart and chronic kidney disease with heart failure and stage 1 through stage 4 chronic kidney disease, or unspecified chronic kidney disease: Secondary | ICD-10-CM | POA: Diagnosis not present

## 2022-05-27 DIAGNOSIS — E6609 Other obesity due to excess calories: Secondary | ICD-10-CM | POA: Diagnosis not present

## 2022-05-27 DIAGNOSIS — I700 Atherosclerosis of aorta: Secondary | ICD-10-CM | POA: Diagnosis not present

## 2022-05-27 DIAGNOSIS — I5023 Acute on chronic systolic (congestive) heart failure: Secondary | ICD-10-CM | POA: Diagnosis not present

## 2022-05-27 DIAGNOSIS — E1129 Type 2 diabetes mellitus with other diabetic kidney complication: Secondary | ICD-10-CM | POA: Diagnosis not present

## 2022-05-27 DIAGNOSIS — N183 Chronic kidney disease, stage 3 unspecified: Secondary | ICD-10-CM | POA: Diagnosis not present

## 2022-05-27 DIAGNOSIS — E1165 Type 2 diabetes mellitus with hyperglycemia: Secondary | ICD-10-CM | POA: Diagnosis not present

## 2022-05-27 DIAGNOSIS — E1140 Type 2 diabetes mellitus with diabetic neuropathy, unspecified: Secondary | ICD-10-CM | POA: Diagnosis not present

## 2022-05-27 DIAGNOSIS — E1169 Type 2 diabetes mellitus with other specified complication: Secondary | ICD-10-CM | POA: Diagnosis not present

## 2022-05-27 DIAGNOSIS — E1122 Type 2 diabetes mellitus with diabetic chronic kidney disease: Secondary | ICD-10-CM | POA: Diagnosis not present

## 2022-06-05 ENCOUNTER — Encounter: Payer: Self-pay | Admitting: Nurse Practitioner

## 2022-06-05 ENCOUNTER — Ambulatory Visit: Payer: Medicare HMO | Admitting: Nurse Practitioner

## 2022-06-05 VITALS — BP 118/68 | HR 68 | Ht 71.0 in | Wt 229.0 lb

## 2022-06-05 DIAGNOSIS — E042 Nontoxic multinodular goiter: Secondary | ICD-10-CM

## 2022-06-05 NOTE — Progress Notes (Signed)
Endocrinology Follow Up Note 06/05/22    ---------------------------------------------------------------------------------------------------------------------- Subjective    Past Medical History:  Diagnosis Date   Acute systolic (congestive) heart failure (HCC)    Anxiety    Arthritis    Atrial fibrillation (HCC)    Back pain    Chronic kidney disease    pt told by PA of Dr. Fransico Him that he had "stage 3 kidney so they had me stop taking Metformin"   Coronary artery disease    Diabetes mellitus without complication (HCC)    High cholesterol    History of gout    Hypertension    Myocardial infarction (HCC) 04/2018   Neuropathy    PAD (peripheral artery disease) (HCC)    Retinal detachment    One on the right and two on the left   Sleep apnea    Stop Bang score of 4   STEMI (ST elevation myocardial infarction) (HCC)    04/08/2018 PCI/DES RCA   Stroke [facility])    February 2022   VT (ventricular tachycardia) [facility])     Past Surgical History:  Procedure Laterality Date   APPENDECTOMY     CARDIOVERSION N/A 04/13/2018   Procedure: CARDIOVERSION;  Surgeon: Chilton Si, MD;  Location: [facility] ENDOSCOPY;  Service: Cardiovascular;  Laterality: N/A;   CATARACT EXTRACTION W/PHACO Right 04/25/2014   Procedure: CATARACT EXTRACTION PHACO AND INTRAOCULAR LENS PLACEMENT (IOC);  Surgeon: Susa Simmonds, MD;  Location: [facility] ORS;  Service: Ophthalmology;  Laterality: Right;  CDE:4.70   CATARACT EXTRACTION W/PHACO Left 07/11/2014   Procedure: CATARACT EXTRACTION PHACO AND INTRAOCULAR LENS PLACEMENT LEFT EYE CDE=8.32;  Surgeon: Susa Simmonds, MD;  Location: [facility] ORS;  Service: Ophthalmology;  Laterality: Left;   COLONOSCOPY WITH PROPOFOL N/A 09/23/2016   [place]ourk: 9 polyps removed, multiple tubular adenomas. recommended 3 yr surveillance   COLONOSCOPY WITH PROPOFOL N/A 12/27/2019   ten sessile polyps, 4-9 mm in size in descending, hepatic flexure, and cecum, 11 mm polyp in sigmoid, three sessile polyps in  rectum and mid rectum 2-3 mm in size. Tubular adenomas and hyperplastic. 3 year surveillance   CORONARY/GRAFT ACUTE MI REVASCULARIZATION N/A 04/07/2018   Procedure: Coronary/Graft Acute MI Revascularization;  Surgeon: Kathleene Hazel, MD;  Location: [facility] INVASIVE CV LAB;  Service: Cardiovascular;  Laterality: N/A;   ENDARTERECTOMY Right 03/29/2020   Procedure: RIGHT CAROTID ARTERY ENDARTERECTOMY WITH PATCH ANGIOPLASTY;  Surgeon: Cephus Shelling, MD;  Location: [facility] OR;  Service: Vascular;  Laterality: Right;   ENDARTERECTOMY Left 09/11/2020   Procedure: ENDARTERECTOMY CAROTID LEFT;  Surgeon: Cephus Shelling, MD;  Location: [facility] OR;  Service: Vascular;  Laterality: Left;   LEFT HEART CATH AND CORONARY ANGIOGRAPHY N/A 04/07/2018   Procedure: LEFT HEART CATH AND CORONARY ANGIOGRAPHY;  Surgeon: Kathleene Hazel, MD;  Location: [facility] INVASIVE CV LAB;  Service: Cardiovascular;  Laterality: N/A;   MELANOMA EXCISION  07/2019   PATCH ANGIOPLASTY Left 09/11/2020   Procedure: PATCH ANGIOPLASTY;  Surgeon: Cephus Shelling, MD;  Location: [facility] OR;  Service: Vascular;  Laterality: Left;   POLYPECTOMY  09/23/2016   Procedure: POLYPECTOMY;  Surgeon: Corbin Ade, MD;  Location: [facility] ENDO SUITE;  Service: Endoscopy;;  colon   POLYPECTOMY  12/27/2019   Procedure: POLYPECTOMY;  Surgeon: Corbin Ade, MD;  Location: [facility] ENDO SUITE;  Service: Endoscopy;;   RETINAL DETACHMENT SURGERY      Social History   Socioeconomic History   Marital status: Married    Spouse name: Not on file   Number of  children: Not on file   Years of education: Not on file   Highest education level: Not on file  Occupational History   Not on file  Tobacco Use   Smoking status: Former    Packs/day: 1.50    Years: 30.00    Additional pack years: 0.00    Total pack years: 45.00    Types: Cigarettes    Quit date: 07/05/2012    Years since quitting: 9.9    Passive exposure: Never   Smokeless tobacco: Never  Vaping Use    Vaping Use: Never used  Substance and Sexual Activity   Alcohol use: No   Drug use: No   Sexual activity: Not Currently    Birth control/protection: None  Other Topics Concern   Not on file  Social History Narrative   Not on file   Social Determinants of Health   Financial Resource Strain: Low Risk  (04/05/2022)   Overall Financial Resource Strain (CARDIA)    Difficulty of Paying Living Expenses: Not hard at all  Food Insecurity: Not on file  Transportation Needs: No Transportation Needs (04/05/2022)   PRAPARE - Administrator, Civil Service (Medical): No    Lack of Transportation (Non-Medical): No  Physical Activity: Not on file  Stress: Not on file  Social Connections: Socially Integrated (12/07/2021)   Social Connection and Isolation Panel [NHANES]    Frequency of Communication with Friends and Family: More than three times a week    Frequency of Social Gatherings with Friends and Family: More than three times a week    Attends Religious Services: More than 4 times per year    Active Member of [employer] or Organizations: Yes    Attends Engineer, structural: More than 4 times per year    Marital Status: Married  Catering manager Violence: Not on file    Current Outpatient Medications on File Prior to Visit  Medication Sig Dispense Refill   ALPRAZolam (XANAX) 0.5 MG tablet Take 0.5 mg by mouth 2 (two) times daily as needed for anxiety.     amLODipine (NORVASC) 10 MG tablet Take 1 tablet (10 mg total) by mouth daily. 30 tablet 1   apixaban (ELIQUIS) 5 MG TABS tablet Take 1 tablet (5 mg total) by mouth 2 (two) times daily. Restart medication tomorrow, 09/12/2020 60 tablet 6   atorvastatin (LIPITOR) 80 MG tablet TAKE ONE TABLET BY MOUTH EVERYDAY AT BEDTIME 90 tablet 2   carvedilol (COREG) 12.5 MG tablet Take 1 tablet (12.5 mg total) by mouth 2 (two) times daily. 180 tablet 3   DULoxetine (CYMBALTA) 60 MG capsule Take 60 mg by mouth daily with lunch.      furosemide  (LASIX) 20 MG tablet Take 20-40 mg by mouth daily as needed for fluid.     gabapentin (NEURONTIN) 300 MG capsule Take 300 mg by mouth 2 (two) times daily.      glimepiride (AMARYL) 4 MG tablet Take 4 mg by mouth daily.     glucose blood (ONETOUCH ULTRA) test strip USE ONE TEST STRIP AS DIRECTED TWICE DAILY 100 each 0   hydrALAZINE (APRESOLINE) 50 MG tablet Take 1 tablet (50 mg total) by mouth 3 (three) times daily. 270 tablet 3   HYDROcodone-acetaminophen (NORCO/VICODIN) 5-325 MG tablet Take 1 tablet by mouth every 4 (four) hours as needed.     insulin NPH Human (NOVOLIN N RELION) 100 UNIT/ML injection Inject 0.5 mLs (50 Units total) into the skin every morning. And syringes  1/day 20 mL 11   metFORMIN (GLUCOPHAGE) 500 MG tablet Take 500 mg by mouth 2 (two) times daily.     mupirocin ointment (BACTROBAN) 2 % Apply 1 application topically daily as needed (melonoma).     nitroGLYCERIN (NITROSTAT) 0.4 MG SL tablet Place 1 tablet (0.4 mg total) under the tongue every 5 (five) minutes as needed. (Patient taking differently: Place 0.4 mg under the tongue every 5 (five) minutes as needed for chest pain.) 25 tablet 2   polyethylene glycol (MIRALAX / GLYCOLAX) 17 g packet Take 17 g by mouth 2 (two) times daily. 60 each 1   rOPINIRole (REQUIP) 1 MG tablet Take 1 mg by mouth 4 (four) times daily.      timolol (TIMOPTIC) 0.5 % ophthalmic solution INSTILL 1 DROP INTO EACH EYE TWICE DAILY (Patient taking differently: Place 1 drop into both eyes 2 (two) times daily.) 10 mL 10   UNIFINE PENTIPS 31G X 5 MM MISC USE TO INJECT INSULIN AT BEDTIME (Patient taking differently: at bedtime.) 100 each 5   ezetimibe (ZETIA) 10 MG tablet Take 1 tablet (10 mg total) by mouth daily. 30 tablet 5   No current facility-administered medications on file prior to visit.      HPI   Xavier Caldwell is a 72 y.o.-year-old male, referred by his PCP, Dr. Sherwood Gambler, for evaluation for multinodular goiter.  This was incidentally found on CT  chest following up on possible lung nodule.  He was then sent for thyroid ultrasound to assess in depth. And subsequently was sent for FNA of mildly suspicious thyroid nodule.  Unfortunately, the specimen obtained was not enough to rule out malignancy, thus will need to be repeated in the future.  Thyroid U/S: 01/21/22 CLINICAL DATA:  Incidental on CT.   EXAM: THYROID ULTRASOUND   TECHNIQUE: Ultrasound examination of the thyroid gland and adjacent soft tissues was performed.   COMPARISON:  CT chest January 14, 2022   FINDINGS: Parenchymal Echotexture: Moderately heterogenous   Isthmus: 1.1 cm   Right lobe: 7.8 x 3.1 x 3.0 cm   Left lobe: 6.6 x 3.2 x 2.8 cm   _________________________________________________________   Estimated total number of nodules >/= 1 cm:   Number of spongiform nodules >/=  2 cm not described below (TR1): 0   Number of mixed cystic and solid nodules >/= 1.5 cm not described below (TR2): 0   _________________________________________________________   Nodule labeled 4 appears to be a mixed cystic and solid isoechoic TR 2 nodule in the thyroid isthmus measuring 1.5 cm. This nodule does NOT meet TI-RADS criteria for biopsy or dedicated follow-up.   Nodule labeled 1 is a mixed cystic and solid isoechoic TR 2 nodule in the superior right thyroid lobe measuring 1.7 cm. This nodule does NOT meet TI-RADS criteria for biopsy or dedicated follow-up.   Nodule labeled 2 is a mixed cystic and solid isoechoic TR 2 nodule in the mid right thyroid lobe measuring 1.1 cm. This nodule does NOT meet TI-RADS criteria for biopsy or dedicated follow-up.   Nodule labeled 3 appears to be best described as a solid isoechoic TR 3 nodule in the inferior right thyroid lobe measuring 3.7 x 3.2 x 3.0 cm. **Given size (>/= 2.5 cm) and appearance, fine needle aspiration of this mildly suspicious nodule should be considered based on TI-RADS criteria.   Nodule labeled 5 is a  mixed cystic and solid isoechoic TR 2 nodule in the superior left thyroid lobe measuring 1.5 cm. This  nodule does NOT meet TI-RADS criteria for biopsy or dedicated follow-up.   Nodule labeled 6 is a mixed cystic and solid isoechoic TR 2 nodule in the posterior aspect of the mid left thyroid lobe measuring 1.5 cm. This nodule does NOT meet TI-RADS criteria for biopsy or dedicated follow-up.   IMPRESSION: 1. Enlarged multinodular thyroid gland. 2. Nodule labeled 3 in the inferior right thyroid lobe (3.7 cm TR 3) meets criteria for biopsy. 3. A number of additional benign-appearing nodules as described do not meet criteria for further dedicated follow-up or biopsy.   The above is in keeping with the ACR TI-RADS recommendations - J Am Coll Radiol 2017;14:587-595.     Electronically Signed   By: Olive Bass M.D.   On: 01/22/2022 14:02   I reviewed pt's thyroid tests: Lab Results  Component Value Date   TSH 0.601 05/22/2022   TSH 0.434 (L) 03/04/2022   TSH 0.17 (L) 09/21/2020   TSH 0.166 (L) 02/29/2020   TSH 0.668 04/17/2018   TSH 0.547 04/11/2018   TSH 0.314 (L) 01/07/2017   TSH 0.410 (L) 01/01/2016   FREET4 1.31 05/22/2022   FREET4 0.99 03/04/2022   FREET4 0.82 09/21/2020   FREET4 1.20 02/29/2020   FREET4 1.13 01/07/2017   FREET4 1.05 01/01/2016     Pt c/o: - decreased sex drive - difficulty breathing when laying down (sleeps in recliner)  Pt denies - feeling nodules in neck - hoarseness - dysphagia - choking   No FH of thyroid ds. No FH of thyroid cancer. No h/o radiation tx to head or neck.  No seaweed or kelp. No recent contrast studies. No steroid use. No herbal supplements. No Biotin supplements or Hair, Skin and Nails vitamins.  Pt also has a history of DM, HLD, PVD, Afib, CAD  with MI, HTN.  Review of systems  Constitutional: + minimally fluctuating body weight,  current Body mass index is 31.94 kg/m. , no fatigue, no subjective hyperthermia, no  subjective hypothermia, decreased sex drive Eyes: no blurry vision, no xerophthalmia ENT: no sore throat, no nodules palpated in throat, no dysphagia/odynophagia, no hoarseness Cardiovascular: no chest pain, no shortness of breath, no palpitations, no leg swelling Respiratory: no cough, no shortness of breath, + difficulty breathing when laying down at night (sleeps in recliner) Gastrointestinal: no nausea/vomiting/diarrhea Musculoskeletal: no muscle/joint aches Skin: no rashes, no hyperemia Neurological: no tremors, no numbness, no tingling, no dizziness Psychiatric: no depression, no anxiety  ---------------------------------------------------------------------------------------------------------------------- Objective    BP 118/68 (BP Location: Left Arm, Patient Position: Sitting, Cuff Size: Large)   Pulse 68   Ht 5\' 11"  (1.803 m)   Wt 229 lb (103.9 kg)   BMI 31.94 kg/m    BP Readings from Last 3 Encounters:  06/05/22 118/68  05/22/22 (!) 158/74  05/17/22 (!) 156/73    Wt Readings from Last 3 Encounters:  06/05/22 229 lb (103.9 kg)  05/22/22 230 lb (104.3 kg)  05/17/22 228 lb 13.4 oz (103.8 kg)     Physical Exam- Limited  Constitutional:  Body mass index is 31.94 kg/m. , not in acute distress, normal state of mind Eyes:  EOMI, no exophthalmos Neck: Supple Thyroid: No gross goiter- no palpable inflammation Musculoskeletal: no gross deformities, strength intact in all four extremities, no gross restriction of joint movements Skin:  no rashes, no hyperemia Neurological: no tremor with outstretched hands    FNA on 03/12/22  CYTOLOGY - NON PAP  CASE: [id number]  PATIENT: Xavier Caldwell  Non-Gynecological Cytology Report  Clinical History: 3.7 cm RLL; TR3  Specimen Submitted:  A. THYROID, RLL, FINE NEEDLE ASPIRATION:    FINAL MICROSCOPIC DIAGNOSIS:  - Scant follicular epithelium present (Bethesda category I)   SPECIMEN ADEQUACY:  Satisfactory but limited  for evaluation, scant cellularity   DIAGNOSTIC COMMENTS:  Inflammation present.   GROSS:  Received is/are 6 slides in 95% Ethyl alcohol, and 30 ccs of pale pink  Cytolyt solution. (CM:cm)  Prepared:  Smears: 6  Concentration Method (Thin Prep):  1  Cell Block:  Cell block attempted, not obtained.  Additional Studies:  Also there was an Afirma collected.   ----------------------------------------------------------------------------------------------------------------------  ASSESSMENT / PLAN:  1. Thyroid Nodule  -He had FNA on 03/12/22 to assess for malignancy of mildly suspicious TR3 nodule which unfortunately only had scant cellularity present, therefore malignancy could not be ruled out.    Will order repeat FNA of 3.7 cm RLL thyroid nodule.  His thyroid antibody testing was negative, ruling out autoimmune thyroid dysfunction.    His repeat thyroid function tests show euthyroid presentation.  He will not need any anti-thyroid treatment or thyroid hormone replacement at this time.     FOLLOW UP PLAN: Return in about 2 months (around 08/05/2022) for Thyroid follow up- FNA.      I spent  37  minutes in the care of the patient today including review of labs from Thyroid Function, CMP, and other relevant labs ; imaging/biopsy records (current and previous including abstractions from other facilities); face-to-face time discussing  his lab results and symptoms, medications doses, his options of short and long term treatment based on the latest standards of care / guidelines;   and documenting the encounter.  [name]  participated in the discussions, expressed understanding, and voiced agreement with the above plans.  All questions were answered to his satisfaction. he is encouraged to contact clinic should he have any questions or concerns prior to his return visit.    Ronny Bacon, [facility] [address] [place], [place]  [address] Phone: [phone] Fax: [phone]

## 2022-06-06 ENCOUNTER — Ambulatory Visit: Payer: Self-pay | Admitting: *Deleted

## 2022-06-06 ENCOUNTER — Encounter: Payer: Self-pay | Admitting: *Deleted

## 2022-06-07 ENCOUNTER — Telehealth: Payer: Self-pay | Admitting: *Deleted

## 2022-06-07 DIAGNOSIS — E042 Nontoxic multinodular goiter: Secondary | ICD-10-CM

## 2022-06-07 NOTE — Telephone Encounter (Signed)
Patient left a message that he would like to change second bx from [facility] to [facility] where he had the first one.

## 2022-06-10 NOTE — Telephone Encounter (Signed)
I thought that is where I sent it.  Ill try again.

## 2022-06-11 NOTE — Telephone Encounter (Signed)
Patient was called , no answer. Was going to let him know that another order had been put in for [facility] to do the scan.

## 2022-06-19 ENCOUNTER — Ambulatory Visit (HOSPITAL_COMMUNITY)
Admission: RE | Admit: 2022-06-19 | Discharge: 2022-06-19 | Disposition: A | Discharge status: Home / Self Care | Payer: Medicare HMO | Source: Ambulatory Visit | Attending: Nurse Practitioner | Admitting: Nurse Practitioner

## 2022-06-19 ENCOUNTER — Encounter (HOSPITAL_COMMUNITY): Payer: Self-pay

## 2022-06-19 DIAGNOSIS — E041 Nontoxic single thyroid nodule: Secondary | ICD-10-CM | POA: Diagnosis not present

## 2022-06-19 DIAGNOSIS — E042 Nontoxic multinodular goiter: Secondary | ICD-10-CM | POA: Insufficient documentation

## 2022-06-19 MED ORDER — LIDOCAINE HCL (PF) 2 % IJ SOLN
10.0000 mL | Freq: Once | INTRAMUSCULAR | Status: AC
  Administered 2022-06-19: 10 mL

## 2022-06-19 MED ORDER — LIDOCAINE HCL (PF) 2 % IJ SOLN
INTRAMUSCULAR | Status: AC
  Filled 2022-06-19: qty 10

## 2022-06-19 NOTE — Progress Notes (Signed)
PT tolerated thyroid biopsy procedure well today. Labs and afirma obtained and sent for pathology and walked to lab at 0926 by [facility] from ultrasound. PT ambulatory at discharge with no acute distress noted and verbalized understanding of discharge instructions.

## 2022-06-21 ENCOUNTER — Encounter: Payer: Self-pay | Admitting: Nurse Practitioner

## 2022-06-21 LAB — CYTOLOGY - NON PAP

## 2022-06-21 NOTE — Progress Notes (Signed)
Noted  

## 2022-06-24 DIAGNOSIS — H5213 Myopia, bilateral: Secondary | ICD-10-CM | POA: Diagnosis not present

## 2022-06-24 DIAGNOSIS — Z0100 Encounter for examination of eyes and vision without abnormal findings: Secondary | ICD-10-CM | POA: Diagnosis not present

## 2022-06-26 ENCOUNTER — Ambulatory Visit: Payer: Self-pay | Admitting: *Deleted

## 2022-06-27 ENCOUNTER — Encounter: Payer: Self-pay | Admitting: *Deleted

## 2022-06-27 NOTE — Patient Outreach (Signed)
  Care Coordination   Follow Up Visit Note   06/26/2022 Name: Xavier Caldwell MRN: [id number] DOB: 02-Aug-1950  Xavier Caldwell is a 72 y.o. year old male who sees Elfredia Nevins, MD for primary care. I spoke with  Xavier Caldwell by phone today.  What matters to the patients health and wellness today?  Finding out results of thyroid biopsy    Goals Addressed             This Visit's Progress    Increase Knowledge of Thryoid Nodules and Treatment Options       Care Coordination Goals: Patient will keep follow-up appointment with endocrinologist on 08/19/22 Patient will reach out to endocrinologist with any new or worsening symptoms related to thyroid function and multinodular goiter Patient will reach out to RN Care Coordinator with any care coordinator or resource needs [phone]        SDOH assessments and interventions completed:  Yes  SDOH Interventions Today    Flowsheet Row Most Recent Value  SDOH Interventions   Physical Activity Interventions Intervention Not Indicated  Stress Interventions Intervention Not Indicated        Care Coordination Interventions:  Yes, provided  Interventions Today    Flowsheet Row Most Recent Value  Chronic Disease   Chronic disease during today's visit Other  [thyroid nodules]  General Interventions   General Interventions Discussed/Reviewed General Interventions Discussed, General Interventions Reviewed, Labs, Doctor Visits  [imaging reports]  Labs --  [thyroid panel]  Doctor Visits Discussed/Reviewed Doctor Visits Discussed, Doctor Visits Reviewed, PCP, Specialist  PCP/Specialist Visits Compliance with follow-up visit  Exercise Interventions   Exercise Discussed/Reviewed Physical Activity  Physical Activity Discussed/Reviewed Physical Activity Discussed, Physical Activity Reviewed  Education Interventions   Education Provided Provided Education  Provided Verbal Education On When to see the doctor, Mental Health/Coping with  Illness, Labs, Exercise, Other  [continuing to stay active and working as long as he enjoys it. Take some time off from work to travel or do other hobbies/interests]  Labs Reviewed --  [thyroid labs. Discussed in detail results of recent thyroid biopsy. Bx was negative. Repeat US in 1 year. Patient was very appreciative of discussion and is very relieved. he will f/u with endo as planned.]       Follow up plan: Follow up call scheduled for 08/28/22    Encounter Outcome:  Pt. Visit Completed   Demetrios Loll, BSN, RN-BC RN Care Coordinator [facility]  [facility] Direct Dial: [phone] Main #: [phone]

## 2022-07-05 DIAGNOSIS — I10 Essential (primary) hypertension: Secondary | ICD-10-CM | POA: Diagnosis not present

## 2022-07-05 DIAGNOSIS — I5023 Acute on chronic systolic (congestive) heart failure: Secondary | ICD-10-CM | POA: Diagnosis not present

## 2022-07-05 DIAGNOSIS — E1122 Type 2 diabetes mellitus with diabetic chronic kidney disease: Secondary | ICD-10-CM | POA: Diagnosis not present

## 2022-07-05 DIAGNOSIS — E1165 Type 2 diabetes mellitus with hyperglycemia: Secondary | ICD-10-CM | POA: Diagnosis not present

## 2022-07-05 DIAGNOSIS — I130 Hypertensive heart and chronic kidney disease with heart failure and stage 1 through stage 4 chronic kidney disease, or unspecified chronic kidney disease: Secondary | ICD-10-CM | POA: Diagnosis not present

## 2022-07-05 NOTE — Patient Outreach (Signed)
  Care Coordination   Follow Up Visit Note   06/06/2022 Name: Xavier Caldwell MRN: [id number] DOB: 16-May-1950  Xavier Caldwell is a 72 y.o. year old male who sees Elfredia Nevins, MD for primary care. I spoke with  [name] by phone today.  What matters to the patients health and wellness today?  Having repeat thyroid biopsy    Goals Addressed             This Visit's Progress    Repeat Thyroid Biopsy and Follow-up Re: Results       Care Coordination Services: Patient will keep appointment for repeat thyroid biopsy Patient will follow pre and post procedure instructions Patient will reach out to provider with any complications post biopsy Patient will f/u with endocrinologist regarding results and treatment options Patient will reach out to RN Care Coordinator [phone] with any resource or care coordination needs        SDOH assessments and interventions completed:  Yes  SDOH Interventions Today    Flowsheet Row Most Recent Value  SDOH Interventions   Transportation Interventions Intervention Not Indicated  Financial Strain Interventions Intervention Not Indicated  Physical Activity Interventions Intervention Not Indicated        Care Coordination Interventions:  Yes, provided  Interventions Today    Flowsheet Row Most Recent Value  Chronic Disease   Chronic disease during today's visit Other  [thyroid nodules]  General Interventions   General Interventions Discussed/Reviewed General Interventions Discussed, General Interventions Reviewed, Doctor Visits  [upcoming thyroid biopsy]  Labs --  [thyroid labs]  PCP/Specialist Visits Compliance with follow-up visit  Exercise Interventions   Exercise Discussed/Reviewed Physical Activity  Physical Activity Discussed/Reviewed Physical Activity Discussed, Physical Activity Reviewed  Education Interventions   Education Provided Provided Education  Provided Verbal Education On When to see the doctor, Mental Health/Coping  with Illness  [Discussed upcoming repeat thyroid biopsy. Anxious about test and results. Provided reassurance. Taking a vacation next week and is looking forward to that. Encouraged to enjoy the time off. considering decreasing work hours]  Mental Health Interventions   Mental Health Discussed/Reviewed Anxiety, Mental Health Discussed, Mental Health Reviewed  Pharmacy Interventions   Pharmacy Dicussed/Reviewed Pharmacy Topics Discussed, Pharmacy Topics Reviewed, Medications and their functions       Follow up plan: Follow up call scheduled for 06/26/22    Encounter Outcome:  Pt. Visit Completed   Demetrios Loll, BSN, RN-BC RN Care Coordinator [facility]  [facility] Direct Dial: [phone] Main #: [phone]

## 2022-07-08 ENCOUNTER — Ambulatory Visit: Payer: Medicare HMO | Admitting: Physician Assistant

## 2022-07-10 ENCOUNTER — Other Ambulatory Visit: Payer: Medicare HMO

## 2022-07-19 ENCOUNTER — Other Ambulatory Visit: Payer: Self-pay | Admitting: *Deleted

## 2022-07-19 DIAGNOSIS — I6523 Occlusion and stenosis of bilateral carotid arteries: Secondary | ICD-10-CM

## 2022-07-30 ENCOUNTER — Encounter: Payer: Self-pay | Admitting: Vascular Surgery

## 2022-07-30 ENCOUNTER — Ambulatory Visit (HOSPITAL_COMMUNITY)
Admission: RE | Admit: 2022-07-30 | Discharge: 2022-07-30 | Disposition: A | Discharge status: Home / Self Care | Payer: Medicare HMO | Source: Ambulatory Visit | Attending: Vascular Surgery | Admitting: Vascular Surgery

## 2022-07-30 ENCOUNTER — Ambulatory Visit: Payer: Medicare HMO | Admitting: Vascular Surgery

## 2022-07-30 ENCOUNTER — Other Ambulatory Visit: Payer: Self-pay | Admitting: Cardiology

## 2022-07-30 VITALS — BP 126/70 | HR 63 | Temp 97.9°F | Wt 229.0 lb

## 2022-07-30 DIAGNOSIS — I6523 Occlusion and stenosis of bilateral carotid arteries: Secondary | ICD-10-CM

## 2022-07-30 NOTE — Progress Notes (Signed)
Patient name: Xavier Caldwell MRN: [id number] DOB: Dec 18, 1950 Sex: male  REASON FOR VISIT: 1 year follow-up for surveillance of carotid artery disease  HPI: Xavier Caldwell is a 72 y.o. male with history of coronary disease status post NSTEMI complicated by V. tach arrest, A. fib on Eliquis, carotid disease, HTN, HLD that presents for 1 year follow-up for surveillance of his carotid artery disease.  Most recently underwent left carotid endarterectomy for an asymptomatic high-grade stenosis on 09/11/2020.  He also previously underwent right carotid endarterectomy on 03/29/2020 for symptomatic high-grade stenosis.  Prior to surgery he had presented to the [facility] ED with some vague lower extremity weakness that progressed to left upper and lower extremity weakness concerning for TIA.    No new concerns today.  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  No neurologic events since last evaluation.  He is taking Eliquis but not aspirin.  He is on high-dose statin.   Past Medical History:  Diagnosis Date   Acute systolic (congestive) heart failure (HCC)    Anxiety    Arthritis    Atrial fibrillation (HCC)    Back pain    Chronic kidney disease    pt told by PA of Dr. Fransico Him that he had "stage 3 kidney so they had me stop taking Metformin"   Coronary artery disease    Diabetes mellitus without complication (HCC)    High cholesterol    History of gout    Hypertension    Myocardial infarction [facility]) 04/2018   Neuropathy    PAD (peripheral artery disease) (HCC)    Retinal detachment    One on the right and two on the left   Sleep apnea    Stop Bang score of 4   STEMI (ST elevation myocardial infarction) (HCC)    04/08/2018 PCI/DES RCA   Stroke [facility])    February 2022   VT (ventricular tachycardia) [facility])     Past Surgical History:  Procedure Laterality Date   APPENDECTOMY     CARDIOVERSION N/A 04/13/2018   Procedure: CARDIOVERSION;  Surgeon: Chilton Si, MD;  Location: [facility] ENDOSCOPY;   Service: Cardiovascular;  Laterality: N/A;   CATARACT EXTRACTION W/PHACO Right 04/25/2014   Procedure: CATARACT EXTRACTION PHACO AND INTRAOCULAR LENS PLACEMENT (IOC);  Surgeon: Susa Simmonds, MD;  Location: [facility] ORS;  Service: Ophthalmology;  Laterality: Right;  CDE:4.70   CATARACT EXTRACTION W/PHACO Left 07/11/2014   Procedure: CATARACT EXTRACTION PHACO AND INTRAOCULAR LENS PLACEMENT LEFT EYE CDE=8.32;  Surgeon: Susa Simmonds, MD;  Location: [facility] ORS;  Service: Ophthalmology;  Laterality: Left;   COLONOSCOPY WITH PROPOFOL N/A 09/23/2016   Rourk: 9 polyps removed, multiple tubular adenomas. recommended 3 yr surveillance   COLONOSCOPY WITH PROPOFOL N/A 12/27/2019   ten sessile polyps, 4-9 mm in size in descending, hepatic flexure, and cecum, 11 mm polyp in sigmoid, three sessile polyps in rectum and mid rectum 2-3 mm in size. Tubular adenomas and hyperplastic. 3 year surveillance   CORONARY/GRAFT ACUTE MI REVASCULARIZATION N/A 04/07/2018   Procedure: Coronary/Graft Acute MI Revascularization;  Surgeon: Kathleene Hazel, MD;  Location: [facility] INVASIVE CV LAB;  Service: Cardiovascular;  Laterality: N/A;   ENDARTERECTOMY Right 03/29/2020   Procedure: RIGHT CAROTID ARTERY ENDARTERECTOMY WITH PATCH ANGIOPLASTY;  Surgeon: Cephus Shelling, MD;  Location: [facility] OR;  Service: Vascular;  Laterality: Right;   ENDARTERECTOMY Left 09/11/2020   Procedure: ENDARTERECTOMY CAROTID LEFT;  Surgeon: Cephus Shelling, MD;  Location: [facility] OR;  Service: Vascular;  Laterality: Left;   LEFT HEART CATH AND CORONARY ANGIOGRAPHY N/A 04/07/2018   Procedure: LEFT HEART CATH AND CORONARY ANGIOGRAPHY;  Surgeon: Kathleene Hazel, MD;  Location: [facility] INVASIVE CV LAB;  Service: Cardiovascular;  Laterality: N/A;   MELANOMA EXCISION  07/2019   PATCH ANGIOPLASTY Left 09/11/2020   Procedure: PATCH ANGIOPLASTY;  Surgeon: Cephus Shelling, MD;  Location: [facility] OR;  Service: Vascular;  Laterality: Left;   POLYPECTOMY  09/23/2016    Procedure: POLYPECTOMY;  Surgeon: Corbin Ade, MD;  Location: [facility] ENDO SUITE;  Service: Endoscopy;;  colon   POLYPECTOMY  12/27/2019   Procedure: POLYPECTOMY;  Surgeon: Corbin Ade, MD;  Location: [facility] ENDO SUITE;  Service: Endoscopy;;   RETINAL DETACHMENT SURGERY      Family History  Problem Relation Age of Onset   Hypertension Father    Colon cancer Neg Hx    Diabetes Neg Hx     SOCIAL HISTORY: Social History   Tobacco Use   Smoking status: Former    Packs/day: 1.50    Years: 30.00    Additional pack years: 0.00    Total pack years: 45.00    Types: Cigarettes    Quit date: 07/05/2012    Years since quitting: 10.0    Passive exposure: Never   Smokeless tobacco: Never  Substance Use Topics   Alcohol use: No    No Known Allergies  Current Outpatient Medications  Medication Sig Dispense Refill   ALPRAZolam (XANAX) 0.5 MG tablet Take 0.5 mg by mouth 2 (two) times daily as needed for anxiety.     amLODipine (NORVASC) 10 MG tablet Take 1 tablet (10 mg total) by mouth daily. 30 tablet 1   apixaban (ELIQUIS) 5 MG TABS tablet Take 1 tablet (5 mg total) by mouth 2 (two) times daily. Restart medication tomorrow, 09/12/2020 60 tablet 6   atorvastatin (LIPITOR) 80 MG tablet TAKE ONE TABLET BY MOUTH EVERYDAY AT BEDTIME 90 tablet 2   carvedilol (COREG) 12.5 MG tablet Take 1 tablet (12.5 mg total) by mouth 2 (two) times daily. 180 tablet 3   DULoxetine (CYMBALTA) 60 MG capsule Take 60 mg by mouth daily with lunch.      furosemide (LASIX) 20 MG tablet Take 20-40 mg by mouth daily as needed for fluid.     gabapentin (NEURONTIN) 300 MG capsule Take 300 mg by mouth 2 (two) times daily.      glimepiride (AMARYL) 4 MG tablet Take 4 mg by mouth daily.     glucose blood (ONETOUCH ULTRA) test strip USE ONE TEST STRIP AS DIRECTED TWICE DAILY 100 each 0   hydrALAZINE (APRESOLINE) 50 MG tablet Take 1 tablet (50 mg total) by mouth 3 (three) times daily. 270 tablet 3   insulin NPH Human (NOVOLIN N  RELION) 100 UNIT/ML injection Inject 0.5 mLs (50 Units total) into the skin every morning. And syringes 1/day 20 mL 11   metFORMIN (GLUCOPHAGE) 500 MG tablet Take 500 mg by mouth 2 (two) times daily.     mupirocin ointment (BACTROBAN) 2 % Apply 1 application topically daily as needed (melonoma).     nitroGLYCERIN (NITROSTAT) 0.4 MG SL tablet Place 1 tablet (0.4 mg total) under the tongue every 5 (five) minutes as needed. (Patient taking differently: Place 0.4 mg under the tongue every 5 (five) minutes as needed for chest pain.) 25 tablet 2   polyethylene glycol (MIRALAX / GLYCOLAX) 17 g packet Take 17 g by mouth 2 (two) times daily.  60 each 1   rOPINIRole (REQUIP) 1 MG tablet Take 1 mg by mouth 4 (four) times daily.      timolol (TIMOPTIC) 0.5 % ophthalmic solution INSTILL 1 DROP INTO EACH EYE TWICE DAILY (Patient taking differently: Place 1 drop into both eyes 2 (two) times daily.) 10 mL 10   UNIFINE PENTIPS 31G X 5 MM MISC USE TO INJECT INSULIN AT BEDTIME (Patient taking differently: at bedtime.) 100 each 5   ezetimibe (ZETIA) 10 MG tablet Take 1 tablet (10 mg total) by mouth daily. 30 tablet 5   No current facility-administered medications for this visit.    REVIEW OF SYSTEMS:  [X]  denotes positive finding, [ ]  denotes negative finding Cardiac  Comments:  Chest pain or chest pressure:    Shortness of breath upon exertion:    Short of breath when lying flat:    Irregular heart rhythm:        Vascular    Pain in calf, thigh, or hip brought on by ambulation:    Pain in feet at night that wakes you up from your sleep:     Blood clot in your veins:    Leg swelling:         Pulmonary    Oxygen at home:    Productive cough:     Wheezing:         Neurologic    Sudden weakness in arms or legs:     Sudden numbness in arms or legs:     Sudden onset of difficulty speaking or slurred speech:    Temporary loss of vision in one eye:     Problems with dizziness:         Gastrointestinal     Blood in stool:     Vomited blood:         Genitourinary    Burning when urinating:     Blood in urine:        Psychiatric    Major depression:         Hematologic    Bleeding problems:    Problems with blood clotting too easily:        Skin    Rashes or ulcers:        Constitutional    Fever or chills:      PHYSICAL EXAM: Vitals:   07/30/22 0904 07/30/22 0905  BP: (!) 144/79 126/70  Pulse: 63   Temp: 97.9 F (36.6 C)   TempSrc: Temporal   SpO2: 97%   Weight: 229 lb (103.9 kg)     GENERAL: The patient is a well-nourished male, in no acute distress. The vital signs are documented above. CARDIAC: There is a regular rate and rhythm.  VASCULAR:  Bilateral neck incisions well-healed MUSCULOSKELETAL: There are no major deformities or cyanosis. NEUROLOGIC: No focal weakness or paresthesias are detected.  Nerves II through XII grossly intact. SKIN: There are no ulcers or rashes noted. PSYCHIATRIC: The patient has a normal affect.  DATA:   Carotid duplex today shows both carotid endarterectomy sites widely patent.  Assessment/Plan:  72 year old male presents for 1 year interval follow-up after left carotid endarterectomy on 09/11/2020 for an asymptomatic high-grade stenosis and also a right carotid endarterectomy on 03/29/2020 for a high-grade symptomatic stenosis.  Discussed his carotid duplex today shows no evidence of significant recurrent stenosis with patent endarterectomy sites bilaterally.  He has remained asymptomatic.  I will see him in one year with repeat carotid duplex for ongoing surveillance.  Cristal Deer  Addison Naegeli, MD [facility] Office: [phone]

## 2022-07-31 DIAGNOSIS — D225 Melanocytic nevi of trunk: Secondary | ICD-10-CM | POA: Diagnosis not present

## 2022-07-31 DIAGNOSIS — Z08 Encounter for follow-up examination after completed treatment for malignant neoplasm: Secondary | ICD-10-CM | POA: Diagnosis not present

## 2022-07-31 DIAGNOSIS — Z85820 Personal history of malignant melanoma of skin: Secondary | ICD-10-CM | POA: Diagnosis not present

## 2022-07-31 DIAGNOSIS — Z1283 Encounter for screening for malignant neoplasm of skin: Secondary | ICD-10-CM | POA: Diagnosis not present

## 2022-08-04 DIAGNOSIS — E1165 Type 2 diabetes mellitus with hyperglycemia: Secondary | ICD-10-CM | POA: Diagnosis not present

## 2022-08-04 DIAGNOSIS — I130 Hypertensive heart and chronic kidney disease with heart failure and stage 1 through stage 4 chronic kidney disease, or unspecified chronic kidney disease: Secondary | ICD-10-CM | POA: Diagnosis not present

## 2022-08-13 ENCOUNTER — Other Ambulatory Visit: Payer: Self-pay

## 2022-08-13 DIAGNOSIS — I6523 Occlusion and stenosis of bilateral carotid arteries: Secondary | ICD-10-CM

## 2022-08-13 NOTE — Progress Notes (Signed)
Cardiology Office Note:  .   Date:  08/27/2022  ID:  Xavier Caldwell, DOB August 07, 1950, MRN [id number] PCP: Elfredia Nevins, MD   [facility] Prov[facility] Cardiologist:  Dina Rich, MD    History of Present Illness: .   Xavier Caldwell is a 72 y.o. 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  Patient seen in the hospital 05/17/22(admitted with perirectal abscess) for SOB and possible flash pulmonary edema unclear if triggered by Afib or HTN treated with lasix. Echo normal LVEF, moderate LVH.  Patient comes in with his wife for f/u. Says he feels sluggish recently. He works as a Engineer, materials at [employer]. Walks 3-4 miles on his shift and it's gotten harder to get through his shift. Sugars have been okand BP's some high readings-150/83 after work. Sunday had a sharp stabbing chest pain when out in the heat eased in 5 seconds before he took his with NTG. No heavy pressure.no palpitations. Having teeth pulled and not eating well because it's hard to chew. Worried about his thyroid nodules and has had 2 biopsies and going to repeat in December. A1C 8.5 in April. Having trouble eating and eating a lot of sweets and frozen dinners.   ROS:    Studies Reviewed: Marland Kitchen         Prior CV Studies:   Echo 05/2022 IMPRESSIONS     1. Left ventricular ejection fraction, by estimation, is 55 to 60%. The  left ventricle has normal function. Left ventricular endocardial border  not optimally defined to evaluate regional wall motion. There is moderate  left ventricular hypertrophy. Left  ventricular diastolic parameters are indeterminate.   2. Right ventricular systolic function is normal. The right ventricular  size is normal. Tricuspid regurgitation signal is inadequate for assessing  PA pressure.   3. Left atrial size was severely dilated.   4. Right  atrial size was severely dilated.   5. The mitral valve is normal in structure. No evidence of mitral valve  regurgitation. No evidence of mitral stenosis.   6. The aortic valve is tricuspid. Aortic valve regurgitation is not  visualized. No aortic stenosis is present.   7. The pulmonic valve was abnormal.   8. The inferior vena cava is dilated in size with <50% respiratory  variability, suggesting right atrial pressure of 15 mmHg.     Risk Assessment/Calculations:    CHA2DS2-VASc Score = 4   This indicates a 4.8% annual risk of stroke. The patient's score is based upon: CHF History: 1 HTN History: 1 Diabetes History: 0 Stroke History: 0 Vascular Disease History: 1 Age Score: 1 Gender Score: 0            Physical Exam:   VS:  BP 116/60   Pulse (!) 58   Ht 5\' 11"  (1.803 m)   Wt 233 lb (105.7 kg)   SpO2 96%   BMI 32.50 kg/m    Wt Readings from Last 3 Encounters:  08/27/22 233 lb (105.7 kg)  08/19/22 229 lb (103.9 kg)  07/30/22 229 lb (103.9 kg)    GEN: Well nourished, well developed in no acute distress NECK: bilateral carotid bruits No JVD;  CARDIAC: irreg irreg no murmurs, rubs, gallops RESPIRATORY:  Clear to auscultation without rales, wheezing or rhonchi  ABDOMEN: Soft, non-tender, non-distended EXTREMITIES:  trace edema; No deformity  ASSESSMENT AND PLAN: .    Fatigue. I suspect this is coming from his diet with recent dental issues. His A1C was 8.5 and he's eating a lot of sweets and frozen dinners daily. Will check CBC and Bmet to be complete but don't think this is cardiac related. I asked him to adjust his diet and gave him some healthy suggestions as well as 2 gm sodium diet.   CAD inf STEMI 04/2020 DES to RCA complicated by VT arrest-atypical sharp chest pain Sunday eased in 5 seconds before NTG. No changes today  PAF controlled with coreg and eliquis.  HFrEF 55-60% on echo 05/2022 -trace of ankle edema. On lasix/hydralazine. 2 gm sodium diet  HTN with  LVH-some elevated readings but normal today. Adjust diet and he'll continue to monitor at home  HLD managed by PCP but LDL 47 08/2021  Carotid stenosis. Stable on dopplers 07/2022 reviewed  DM2-needs better control as A1C 8.5 in April and eating a lot of sweets.        Dispo: f/u in 6 months  Signed, Jacolyn Reedy, PA-C

## 2022-08-19 ENCOUNTER — Encounter: Payer: Self-pay | Admitting: Nurse Practitioner

## 2022-08-19 ENCOUNTER — Ambulatory Visit: Payer: Medicare HMO | Admitting: Nurse Practitioner

## 2022-08-19 VITALS — BP 120/75 | HR 80 | Ht 71.0 in | Wt 229.0 lb

## 2022-08-19 DIAGNOSIS — E042 Nontoxic multinodular goiter: Secondary | ICD-10-CM

## 2022-08-19 NOTE — Progress Notes (Signed)
Endocrinology Follow Up Note 08/19/22    ---------------------------------------------------------------------------------------------------------------------- Subjective    Past Medical History:  Diagnosis Date   Acute systolic (congestive) heart failure (HCC)    Anxiety    Arthritis    Atrial fibrillation (HCC)    Back pain    Chronic kidney disease    pt told by PA of Dr. Fransico Him that he had "stage 3 kidney so they had me stop taking Metformin"   Coronary artery disease    Diabetes mellitus without complication (HCC)    High cholesterol    History of gout    Hypertension    Myocardial infarction (HCC) 04/2018   Neuropathy    PAD (peripheral artery disease) (HCC)    Retinal detachment    One on the right and two on the left   Sleep apnea    Stop Bang score of 4   STEMI (ST elevation myocardial infarction) (HCC)    04/08/2018 PCI/DES RCA   Stroke [facility])    February 2022   VT (ventricular tachycardia) [facility])     Past Surgical History:  Procedure Laterality Date   APPENDECTOMY     CARDIOVERSION N/A 04/13/2018   Procedure: CARDIOVERSION;  Surgeon: Chilton Si, MD;  Location: [facility] ENDOSCOPY;  Service: Cardiovascular;  Laterality: N/A;   CATARACT EXTRACTION W/PHACO Right 04/25/2014   Procedure: CATARACT EXTRACTION PHACO AND INTRAOCULAR LENS PLACEMENT (IOC);  Surgeon: Susa Simmonds, MD;  Location: [facility] ORS;  Service: Ophthalmology;  Laterality: Right;  CDE:4.70   CATARACT EXTRACTION W/PHACO Left 07/11/2014   Procedure: CATARACT EXTRACTION PHACO AND INTRAOCULAR LENS PLACEMENT LEFT EYE CDE=8.32;  Surgeon: Susa Simmonds, MD;  Location: [facility] ORS;  Service: Ophthalmology;  Laterality: Left;   COLONOSCOPY WITH PROPOFOL N/A 09/23/2016   Rourk: 9 polyps removed, multiple tubular adenomas. recommended 3 yr surveillance   COLONOSCOPY WITH PROPOFOL N/A 12/27/2019   ten sessile polyps, 4-9 mm in size in descending, hepatic flexure, and cecum, 11 mm polyp in sigmoid, three sessile polyps in  rectum and mid rectum 2-3 mm in size. Tubular adenomas and hyperplastic. 3 year surveillance   CORONARY/GRAFT ACUTE MI REVASCULARIZATION N/A 04/07/2018   Procedure: Coronary/Graft Acute MI Revascularization;  Surgeon: Kathleene Hazel, MD;  Location: [facility] INVASIVE CV LAB;  Service: Cardiovascular;  Laterality: N/A;   ENDARTERECTOMY Right 03/29/2020   Procedure: RIGHT CAROTID ARTERY ENDARTERECTOMY WITH PATCH ANGIOPLASTY;  Surgeon: Cephus Shelling, MD;  Location: [facility] OR;  Service: Vascular;  Laterality: Right;   ENDARTERECTOMY Left 09/11/2020   Procedure: ENDARTERECTOMY CAROTID LEFT;  Surgeon: Cephus Shelling, MD;  Location: [facility] OR;  Service: Vascular;  Laterality: Left;   LEFT HEART CATH AND CORONARY ANGIOGRAPHY N/A 04/07/2018   Procedure: LEFT HEART CATH AND CORONARY ANGIOGRAPHY;  Surgeon: Kathleene Hazel, MD;  Location: [facility] INVASIVE CV LAB;  Service: Cardiovascular;  Laterality: N/A;   MELANOMA EXCISION  07/2019   PATCH ANGIOPLASTY Left 09/11/2020   Procedure: PATCH ANGIOPLASTY;  Surgeon: Cephus Shelling, MD;  Location[facility] OR;  Service: Vascular;  Laterality: Left;   POLYPECTOMY  09/23/2016   Procedure: POLYPECTOMY;  Surgeon: Corbin Ade, MD;  Location: [facility] ENDO SUITE;  Service: Endoscopy;;  colon   POLYPECTOMY  12/27/2019   Procedure: POLYPECTOMY;  Surgeon: Corbin Ade, MD;  Location: [facility] ENDO SUITE;  Service: Endoscopy;;   RETINAL DETACHMENT SURGERY      Social History   Socioeconomic History   Marital status: Married    Spouse name: Not on file   Number of  children: Not on file   Years of education: Not on file   Highest education level: Not on file  Occupational History   Not on file  Tobacco Use   Smoking status: Former    Current packs/day: 0.00    Average packs/day: 1.5 packs/day for 30.0 years (45.0 ttl pk-yrs)    Types: Cigarettes    Start date: 07/06/1982    Quit date: 07/05/2012    Years since quitting: 10.1    Passive exposure: Never   Smokeless  tobacco: Never  Vaping Use   Vaping status: Never Used  Substance and Sexual Activity   Alcohol use: No   Drug use: No   Sexual activity: Not Currently    Birth control/protection: None  Other Topics Concern   Not on file  Social History Narrative   Not on file   Social Determinants of Health   Financial Resource Strain: Low Risk  (06/06/2022)   Overall Financial Resource Strain (CARDIA)    Difficulty of Paying Living Expenses: Not hard at all  Food Insecurity: Not on file  Transportation Needs: No Transportation Needs (06/06/2022)   PRAPARE - Administrator, Civil Service (Medical): No    Lack of Transportation (Non-Medical): No  Physical Activity: Sufficiently Active (06/27/2022)   Exercise Vital Sign    Days of Exercise per Week: 5 days    Minutes of Exercise per Session: 60 min  Stress: No Stress Concern Present (06/26/2022)   [employer] of Occupational Health - Occupational Stress Questionnaire    Feeling of Stress : Only a little  Social Connections: Socially Integrated (12/07/2021)   Social Connection and Isolation Panel [NHANES]    Frequency of Communication with Friends and Family: More than three times a week    Frequency of Social Gatherings with Friends and Family: More than three times a week    Attends Religious Services: More than 4 times per year    Active Member of [employer] or Organizations: Yes    Attends Engineer, structural: More than 4 times per year    Marital Status: Married  Catering manager Violence: Not on file    Current Outpatient Medications on File Prior to Visit  Medication Sig Dispense Refill   ALPRAZolam (XANAX) 0.5 MG tablet Take 0.5 mg by mouth 2 (two) times daily as needed for anxiety.     amLODipine (NORVASC) 10 MG tablet Take 1 tablet (10 mg total) by mouth daily. 30 tablet 1   apixaban (ELIQUIS) 5 MG TABS tablet Take 1 tablet (5 mg total) by mouth 2 (two) times daily. Restart medication tomorrow, 09/12/2020 60 tablet 6    atorvastatin (LIPITOR) 80 MG tablet TAKE ONE TABLET BY MOUTH EVERYDAY AT BEDTIME 90 tablet 2   carvedilol (COREG) 12.5 MG tablet Take 1 tablet (12.5 mg total) by mouth 2 (two) times daily. 180 tablet 3   DULoxetine (CYMBALTA) 60 MG capsule Take 60 mg by mouth daily with lunch.      ezetimibe (ZETIA) 10 MG tablet Take 1 tablet (10 mg total) by mouth daily. 30 tablet 5   furosemide (LASIX) 20 MG tablet Take 20-40 mg by mouth daily as needed for fluid.     gabapentin (NEURONTIN) 300 MG capsule Take 300 mg by mouth 2 (two) times daily.      glimepiride (AMARYL) 4 MG tablet Take 4 mg by mouth daily.     glucose blood (ONETOUCH ULTRA) test strip USE ONE TEST STRIP AS DIRECTED TWICE DAILY 100  each 0   hydrALAZINE (APRESOLINE) 50 MG tablet TAKE ONE TABLET BY MOUTH THREE TIMES DAILY 270 tablet 0   insulin NPH Human (NOVOLIN N RELION) 100 UNIT/ML injection Inject 0.5 mLs (50 Units total) into the skin every morning. And syringes 1/day 20 mL 11   metFORMIN (GLUCOPHAGE) 500 MG tablet Take 500 mg by mouth 2 (two) times daily.     mupirocin ointment (BACTROBAN) 2 % Apply 1 application topically daily as needed (melonoma).     nitroGLYCERIN (NITROSTAT) 0.4 MG SL tablet Place 1 tablet (0.4 mg total) under the tongue every 5 (five) minutes as needed. (Patient taking differently: Place 0.4 mg under the tongue every 5 (five) minutes as needed for chest pain.) 25 tablet 2   polyethylene glycol (MIRALAX / GLYCOLAX) 17 g packet Take 17 g by mouth 2 (two) times daily. 60 each 1   rOPINIRole (REQUIP) 1 MG tablet Take 1 mg by mouth 4 (four) times daily.      timolol (TIMOPTIC) 0.5 % ophthalmic solution INSTILL 1 DROP INTO EACH EYE TWICE DAILY (Patient taking differently: Place 1 drop into both eyes 2 (two) times daily.) 10 mL 10   UNIFINE PENTIPS 31G X 5 MM MISC USE TO INJECT INSULIN AT BEDTIME (Patient taking differently: at bedtime.) 100 each 5   No current facility-administered medications on file prior to visit.       HPI   KWINTON MAAHS is a 72 y.o.-year-old male, referred by his PCP, Dr. Sherwood Gambler, for evaluation for multinodular goiter.  This was incidentally found on CT chest following up on possible lung nodule.  He was then sent for thyroid ultrasound to assess in depth. And subsequently was sent for FNA of mildly suspicious thyroid nodule.  Unfortunately, the specimen obtained was not enough to rule out malignancy, thus needed to be repeated.  The repeat biopsy was sufficient to determine benignity.     I reviewed pt's thyroid tests: Lab Results  Component Value Date   TSH 0.601 05/22/2022   TSH 0.434 (L) 03/04/2022   TSH 0.17 (L) 09/21/2020   TSH 0.166 (L) 02/29/2020   TSH 0.668 04/17/2018   TSH 0.547 04/11/2018   TSH 0.314 (L) 01/07/2017   TSH 0.410 (L) 01/01/2016   FREET4 1.31 05/22/2022   FREET4 0.99 03/04/2022   FREET4 0.82 09/21/2020   FREET4 1.20 02/29/2020   FREET4 1.13 01/07/2017   FREET4 1.05 01/01/2016     Pt c/o: - decreased sex drive - difficulty breathing when laying down (sleeps in recliner)  Pt denies - feeling nodules in neck - hoarseness - dysphagia - choking   No FH of thyroid ds. No FH of thyroid cancer. No h/o radiation tx to head or neck.  No seaweed or kelp. No recent contrast studies. No steroid use. No herbal supplements. No Biotin supplements or Hair, Skin and Nails vitamins.  Pt also has a history of DM, HLD, PVD, Afib, CAD  with MI, HTN.  Review of systems  Constitutional: + Minimally fluctuating body weight,  current Body mass index is 31.94 kg/m. , no fatigue, no subjective hyperthermia, no subjective hypothermia Eyes: no blurry vision, no xerophthalmia ENT: no sore throat, no nodules palpated in throat, no dysphagia/odynophagia, no hoarseness Cardiovascular: no chest pain, no shortness of breath, no palpitations, no leg swelling Respiratory: no cough, no shortness of breath Gastrointestinal: no nausea/vomiting/diarrhea Musculoskeletal: no  muscle/joint aches Skin: no rashes, no hyperemia Neurological: no tremors, no numbness, no tingling, no dizziness Psychiatric: no depression,  no anxiety  ---------------------------------------------------------------------------------------------------------------------- Objective    BP 120/75 (BP Location: Left Arm, Patient Position: Sitting, Cuff Size: Large)   Pulse 80   Ht 5\' 11"  (1.803 m)   Wt 229 lb (103.9 kg)   BMI 31.94 kg/m    BP Readings from Last 3 Encounters:  08/19/22 120/75  07/30/22 126/70  06/19/22 (!) 140/74    Wt Readings from Last 3 Encounters:  08/19/22 229 lb (103.9 kg)  07/30/22 229 lb (103.9 kg)  06/05/22 229 lb (103.9 kg)     Physical Exam- Limited  Constitutional:  Body mass index is 31.94 kg/m. , not in acute distress, normal state of mind Eyes:  EOMI, no exophthalmos Musculoskeletal: no gross deformities, strength intact in all four extremities, no gross restriction of joint movements Skin:  no rashes, no hyperemia Neurological: no tremor with outstretched hands    Thyroid Ultrasound from 01/21/22 CLINICAL DATA:  Incidental on CT.   EXAM: THYROID ULTRASOUND   TECHNIQUE: Ultrasound examination of the thyroid gland and adjacent soft tissues was performed.   COMPARISON:  CT chest January 14, 2022   FINDINGS: Parenchymal Echotexture: Moderately heterogenous   Isthmus: 1.1 cm   Right lobe: 7.8 x 3.1 x 3.0 cm   Left lobe: 6.6 x 3.2 x 2.8 cm   _________________________________________________________   Estimated total number of nodules >/= 1 cm:   Number of spongiform nodules >/=  2 cm not described below (TR1): 0   Number of mixed cystic and solid nodules >/= 1.5 cm not described below (TR2): 0   _________________________________________________________   Nodule labeled 4 appears to be a mixed cystic and solid isoechoic TR 2 nodule in the thyroid isthmus measuring 1.5 cm. This nodule does NOT meet TI-RADS criteria for  biopsy or dedicated follow-up.   Nodule labeled 1 is a mixed cystic and solid isoechoic TR 2 nodule in the superior right thyroid lobe measuring 1.7 cm. This nodule does NOT meet TI-RADS criteria for biopsy or dedicated follow-up.   Nodule labeled 2 is a mixed cystic and solid isoechoic TR 2 nodule in the mid right thyroid lobe measuring 1.1 cm. This nodule does NOT meet TI-RADS criteria for biopsy or dedicated follow-up.   Nodule labeled 3 appears to be best described as a solid isoechoic TR 3 nodule in the inferior right thyroid lobe measuring 3.7 x 3.2 x 3.0 cm. **Given size (>/= 2.5 cm) and appearance, fine needle aspiration of this mildly suspicious nodule should be considered based on TI-RADS criteria.   Nodule labeled 5 is a mixed cystic and solid isoechoic TR 2 nodule in the superior left thyroid lobe measuring 1.5 cm. This nodule does NOT meet TI-RADS criteria for biopsy or dedicated follow-up.   Nodule labeled 6 is a mixed cystic and solid isoechoic TR 2 nodule in the posterior aspect of the mid left thyroid lobe measuring 1.5 cm. This nodule does NOT meet TI-RADS criteria for biopsy or dedicated follow-up.   IMPRESSION: 1. Enlarged multinodular thyroid gland. 2. Nodule labeled 3 in the inferior right thyroid lobe (3.7 cm TR 3) meets criteria for biopsy. 3. A number of additional benign-appearing nodules as described do not meet criteria for further dedicated follow-up or biopsy.   The above is in keeping with the ACR TI-RADS recommendations - J Am Coll Radiol 2017;14:587-595.     Electronically Signed   By: Olive Bass M.D.   On: 01/22/2022 14:02  FNA on 03/12/22  CYTOLOGY - NON PAP  CASE: [id number]  PATIENT: Reed Breech  Non-Gynecological Cytology Report      Clinical History: 3.7 cm RLL; TR3  Specimen Submitted:  A. THYROID, RLL, FINE NEEDLE ASPIRATION:    FINAL MICROSCOPIC DIAGNOSIS:  - Scant follicular epithelium present (Bethesda category I)    SPECIMEN ADEQUACY:  Satisfactory but limited for evaluation, scant cellularity   DIAGNOSTIC COMMENTS:  Inflammation present.   GROSS:  Received is/are 6 slides in 95% Ethyl alcohol, and 30 ccs of pale pink  Cytolyt solution. (CM:cm)  Prepared:  Smears: 6  Concentration Method (Thin Prep):  1  Cell Block:  Cell block attempted, not obtained.  Additional Studies:  Also there was an Afirma collected.  ------------------------------------------------------------------------------------------------------------- Repeat FNA 06/19/22 CYTOLOGY - NON PAP CASE: [id number] PATIENT: Reed Breech Non-Gynecological Cytology Report  Clinical History: Thyroid nodule Specimen Submitted:  A. THYROID, RIGHT LOWER LOBE, FINE NEEDLE ASPIRATION:  FINAL MICROSCOPIC DIAGNOSIS: - Consistent with benign follicular nodule (Bethesda category II)  SPECIMEN ADEQUACY: Satisfactory for evaluation  DIAGNOSTIC COMMENTS: The aspirate shows abundant colloid with scattered follicular groups and macrophages consistent with a benign follicular/colloid nodule (follicular nodular disease).  GROSS: Received is/are 6 slides in 95% Ethyl alcohol, and 30ccs of pale beige Cytolyt solution. (TS:ts) Prepared: Smears:  6 Concentration Method (Thin Prep):  1 Cell Block:  Cell block attempted, not obtained. Additional Studies:  Afirma collected.     Final Diagnosis performed by Jerene Bears, MD.   Electronically signed 06/21/2022 Technical component performed at [facility], [address], [place], [place] [address].  Professional component performed at [employer]. [facility], [address] [place], [place] [address].  Immunohistochemistry Technical component (if applicable) was performed at [id number]. [address], [place], [place] [address].   IMMUNOHISTOCHEMISTRY DISCLAIMER (if applicable): Some of these immunohistochemical stains may have  been developed and the performance characteristics determine by [facility]. Some may not have been cleared or approved by the U.S. Food and Drug Administration. The FDA has determined that such clearance or approval is not necessary. This test is used for clinical purposes. It should not be regarded as investigational or for research. This laboratory is certified under the Clinical Laboratory Improvement Amendments of 1988 (CLIA-88) as qualified to perform high complexity clinical laboratory testing.  The controls stained appropriately.      Latest Reference Range & Units 04/17/18 08:29 02/29/20 10:22 09/21/20 09:30 03/04/22 09:01 05/22/22 09:25  TSH 0.450 - 4.500 uIU/mL 0.668 0.166 (L) 0.17 (L) 0.434 (L) 0.601  Triiodothyronine,Free,Serum 2.0 - 4.4 pg/mL    3.2 3.1  T4,Free(Direct) 0.82 - 1.77 ng/dL  9.14 7.82 9.56 2.13  Thyroxine (T4) 4.5 - 12.0 ug/dL 9.2      Thyroperoxidase Ab SerPl-aCnc 0 - 34 IU/mL    10   Thyroglobulin Antibody 0.0 - 0.9 IU/mL    <1.0   (L): Data is abnormally low  ----------------------------------------------------------------------------------------------------------------------  ASSESSMENT / PLAN:  1. Thyroid Nodule  -He had FNA on 03/12/22 to assess for malignancy of mildly suspicious TR3 nodule which unfortunately only had scant cellularity present, therefore malignancy could not be ruled out.  This biopsy was repeated once inflammation had decreased and was determined to be benign.  The rest of the nodules found on previous ultrasound do not meet criteria for additional follow up.  Will repeat thyroid ultrasound in December 2024 to assess for any changes.  His thyroid antibody testing was negative, ruling out autoimmune thyroid dysfunction.    His previous thyroid function  tests show euthyroid presentation.  He will not need any anti-thyroid treatment or thyroid hormone replacement at this time.  I do recommend at least annual surveillance of  thyroid function tests, will order to be done prior to next visit.     FOLLOW UP PLAN: Return in about 6 months (around 02/19/2023) for Thyroid follow up, Previsit labs, thyroid ultrasound- in December.     I spent  21  minutes in the care of the patient today including review of labs from Thyroid Function, CMP, and other relevant labs ; imaging/biopsy records (current and previous including abstractions from other facilities); face-to-face time discussing  his lab results and symptoms, medications doses, his options of short and long term treatment based on the latest standards of care / guidelines;   and documenting the encounter.  [name]  participated in the discussions, expressed understanding, and voiced agreement with the above plans.  All questions were answered to his satisfaction. he is encouraged to contact clinic should he have any questions or concerns prior to his return visit.    Ronny Bacon, [facility] [address] [place], [place] [address] Phone: [phone] Fax: [phone]

## 2022-08-27 ENCOUNTER — Other Ambulatory Visit (HOSPITAL_COMMUNITY)
Admission: RE | Admit: 2022-08-27 | Discharge: 2022-08-27 | Disposition: A | Discharge status: Home / Self Care | Payer: Medicare HMO | Source: Ambulatory Visit | Attending: Physician Assistant | Admitting: Physician Assistant

## 2022-08-27 ENCOUNTER — Encounter: Payer: Self-pay | Admitting: Physician Assistant

## 2022-08-27 ENCOUNTER — Ambulatory Visit: Payer: Medicare HMO | Attending: Physician Assistant | Admitting: Physician Assistant

## 2022-08-27 VITALS — BP 116/60 | HR 58 | Ht 71.0 in | Wt 233.0 lb

## 2022-08-27 DIAGNOSIS — E782 Mixed hyperlipidemia: Secondary | ICD-10-CM

## 2022-08-27 DIAGNOSIS — I2510 Atherosclerotic heart disease of native coronary artery without angina pectoris: Secondary | ICD-10-CM | POA: Insufficient documentation

## 2022-08-27 DIAGNOSIS — I10 Essential (primary) hypertension: Secondary | ICD-10-CM | POA: Diagnosis not present

## 2022-08-27 DIAGNOSIS — R5383 Other fatigue: Secondary | ICD-10-CM

## 2022-08-27 DIAGNOSIS — Z794 Long term (current) use of insulin: Secondary | ICD-10-CM | POA: Diagnosis not present

## 2022-08-27 DIAGNOSIS — E1122 Type 2 diabetes mellitus with diabetic chronic kidney disease: Secondary | ICD-10-CM | POA: Diagnosis not present

## 2022-08-27 DIAGNOSIS — I5032 Chronic diastolic (congestive) heart failure: Secondary | ICD-10-CM

## 2022-08-27 DIAGNOSIS — N1831 Chronic kidney disease, stage 3a: Secondary | ICD-10-CM | POA: Diagnosis not present

## 2022-08-27 DIAGNOSIS — I4891 Unspecified atrial fibrillation: Secondary | ICD-10-CM

## 2022-08-27 DIAGNOSIS — I6523 Occlusion and stenosis of bilateral carotid arteries: Secondary | ICD-10-CM | POA: Diagnosis not present

## 2022-08-27 LAB — CBC
HCT: 39.1 % (ref 39.0–52.0)
Hemoglobin: 12.8 g/dL — ABNORMAL LOW (ref 13.0–17.0)
MCH: 27.9 pg (ref 26.0–34.0)
MCHC: 32.7 g/dL (ref 30.0–36.0)
MCV: 85.4 fL (ref 80.0–100.0)
Platelets: 200 10*3/uL (ref 150–400)
RBC: 4.58 MIL/uL (ref 4.22–5.81)
RDW: 14.9 % (ref 11.5–15.5)
WBC: 9 10*3/uL (ref 4.0–10.5)
nRBC: 0 % (ref 0.0–0.2)

## 2022-08-27 LAB — BASIC METABOLIC PANEL
Anion gap: 8 (ref 5–15)
BUN: 18 mg/dL (ref 8–23)
CO2: 25 mmol/L (ref 22–32)
Calcium: 8.8 mg/dL — ABNORMAL LOW (ref 8.9–10.3)
Chloride: 101 mmol/L (ref 98–111)
Creatinine, Ser: 1.05 mg/dL (ref 0.61–1.24)
GFR, Estimated: >60 mL/min (ref >60)
Glucose, Bld: 212 mg/dL — ABNORMAL HIGH (ref 70–99)
Potassium: 4.1 mmol/L (ref 3.5–5.1)
Sodium: 134 mmol/L — ABNORMAL LOW (ref 135–145)

## 2022-08-27 NOTE — Patient Instructions (Signed)
Medication Instructions:  Your physician recommends that you continue on your current medications as directed. Please refer to the Current Medication list given to you today.   Labwork: CBC,BMET Today  Testing/Procedures: None today  Follow-Up: 6 months Dr.Branch  Any Other Special Instructions Will Be Listed Below (If Applicable).  If you need a refill on your cardiac medications before your next appointment, please call your pharmacy.   Two Gram Sodium Diet 2000 mg  What is Sodium? Sodium is a mineral found naturally in many foods. The most significant source of sodium in the diet is table salt, which is about 40% sodium.  Processed, convenience, and preserved foods also contain a large amount of sodium.  The body needs only 500 mg of sodium daily to function,  A normal diet provides more than enough sodium even if you do not use salt.  Why Limit Sodium? A build up of sodium in the body can cause thirst, increased blood pressure, shortness of breath, and water retention.  Decreasing sodium in the diet can reduce edema and risk of heart attack or stroke associated with high blood pressure.  Keep in mind that there are many other factors involved in these health problems.  Heredity, obesity, lack of exercise, cigarette smoking, stress and what you eat all play a role.  General Guidelines: Do not add salt at the table or in cooking.  One teaspoon of salt contains over 2 grams of sodium. Read food labels Avoid processed and convenience foods Ask your dietitian before eating any foods not dicussed in the menu planning guidelines Consult your physician if you wish to use a salt substitute or a sodium containing medication such as antacids.  Limit milk and milk products to 16 oz (2 cups) per day.  Shopping Hints: READ LABELS!! "Dietetic" does not necessarily mean low sodium. Salt and other sodium ingredients are often added to foods during processing.    Menu Planning Guidelines Food  Group Choose More Often Avoid  Beverages (see also the milk group All fruit juices, low-sodium, salt-free vegetables juices, low-sodium carbonated beverages Regular vegetable or tomato juices, commercially softened water used for drinking or cooking  Breads and Cereals Enriched white, wheat, rye and pumpernickel bread, hard rolls and dinner rolls; muffins, cornbread and waffles; most dry cereals, cooked cereal without added salt; unsalted crackers and breadsticks; low sodium or homemade bread crumbs Bread, rolls and crackers with salted tops; quick breads; instant hot cereals; pancakes; commercial bread stuffing; self-rising flower and biscuit mixes; regular bread crumbs or cracker crumbs  Desserts and Sweets Desserts and sweets mad with mild should be within allowance Instant pudding mixes and cake mixes  Fats Butter or margarine; vegetable oils; unsalted salad dressings, regular salad dressings limited to 1 Tbs; light, sour and heavy cream Regular salad dressings containing bacon fat, bacon bits, and salt pork; snack dips made with instant soup mixes or processed cheese; salted nuts  Fruits Most fresh, frozen and canned fruits Fruits processed with salt or sodium-containing ingredient (some dried fruits are processed with sodium sulfites        Vegetables Fresh, frozen vegetables and low- sodium canned vegetables Regular canned vegetables, sauerkraut, pickled vegetables, and others prepared in brine; frozen vegetables in sauces; vegetables seasoned with ham, bacon or salt pork  Condiments, Sauces, Miscellaneous  Salt substitute with physician's approval; pepper, herbs, spices; vinegar, lemon or lime juice; hot pepper sauce; garlic powder, onion powder, low sodium soy sauce (1 Tbs.); low sodium condiments (ketchup, chili sauce, mustard)  in limited amounts (1 tsp.) fresh ground horseradish; unsalted tortilla chips, pretzels, potato chips, popcorn, salsa (1/4 cup) Any seasoning made with salt including  garlic salt, celery salt, onion salt, and seasoned salt; sea salt, rock salt, kosher salt; meat tenderizers; monosodium glutamate; mustard, regular soy sauce, barbecue, sauce, chili sauce, teriyaki sauce, steak sauce, Worcestershire sauce, and most flavored vinegars; canned gravy and mixes; regular condiments; salted snack foods, olives, picles, relish, horseradish sauce, catsup   Food preparation: Try these seasonings Meats:    Pork Sage, onion Serve with applesauce  Chicken Poultry seasoning, thyme, parsley Serve with cranberry sauce  Lamb Curry powder, rosemary, garlic, thyme Serve with mint sauce or jelly  Veal Marjoram, basil Serve with current jelly, cranberry sauce  Beef Pepper, bay leaf Serve with dry mustard, unsalted chive butter  Fish [place] leaf, dill Serve with unsalted lemon butter, unsalted parsley butter  Vegetables:    Asparagus Lemon juice   Broccoli Lemon juice   Carrots Mustard dressing parsley, mint, nutmeg, glazed with unsalted butter and sugar   Green beans Marjoram, lemon juice, nutmeg,dill seed   Tomatoes Basil, marjoram, onion   Spice /blend for [employer]" 4 tsp ground thyme 1 tsp ground sage 3 tsp ground rosemary 4 tsp ground marjoram   Test your knowledge A product that says "Salt Free" may still contain sodium. True or False Garlic Powder and Hot Pepper Sauce an be used as alternative seasonings.True or False Processed foods have more sodium than fresh foods.  True or False Canned Vegetables have less sodium than froze True or False   WAYS TO DECREASE YOUR SODIUM INTAKE Avoid the use of added salt in cooking and at the table.  Table salt (and other prepared seasonings which contain salt) is probably one of the greatest sources of sodium in the diet.  Unsalted foods can gain flavor from the sweet, sour, and butter taste sensations of herbs and spices.  Instead of using salt for seasoning, try the following seasonings with the foods listed.  Remember: how you  use them to enhance natural food flavors is limited only by your creativity... Allspice-Meat, fish, eggs, fruit, peas, red and yellow vegetables Almond Extract-Fruit baked goods Anise Seed-Sweet breads, fruit, carrots, beets, cottage cheese, cookies (tastes like licorice) Basil-Meat, fish, eggs, vegetables, rice, vegetables salads, soups, sauces Bay Leaf-Meat, fish, stews, poultry Burnet-Salad, vegetables (cucumber-like flavor) Caraway Seed-Bread, cookies, cottage cheese, meat, vegetables, cheese, rice Cardamon-Baked goods, fruit, soups Celery Powder or seed-Salads, salad dressings, sauces, meatloaf, soup, bread.Do not use  celery salt Chervil-Meats, salads, fish, eggs, vegetables, cottage cheese (parsley-like flavor) Chili Power-Meatloaf, chicken cheese, corn, eggplant, egg dishes Chives-Salads cottage cheese, egg dishes, soups, vegetables, sauces Cilantro-Salsa, casseroles Cinnamon-Baked goods, fruit, pork, lamb, chicken, carrots Cloves-Fruit, baked goods, fish, pot roast, green beans, beets, carrots Coriander-Pastry, cookies, meat, salads, cheese (lemon-orange flavor) Cumin-Meatloaf, fish,cheese, eggs, cabbage,fruit pie (caraway flavor) [employer], fruit, eggs, fish, poultry, cottage cheese, vegetables Dill Seed-Meat, cottage cheese, poultry, vegetables, fish, salads, bread Fennel Seed-Bread, cookies, apples, pork, eggs, fish, beets, cabbage, cheese, Licorice-like flavor Garlic-(buds or powder) Salads, meat, poultry, fish, bread, butter, vegetables, potatoes.Do not  use garlic salt Ginger-Fruit, vegetables, baked goods, meat, fish, poultry Horseradish Root-Meet, vegetables, butter Lemon Juice or Extract-Vegetables, fruit, tea, baked goods, fish salads Mace-Baked goods fruit, vegetables, fish, poultry (taste like nutmeg) Maple Extract-Syrups Marjoram-Meat, chicken, fish, vegetables, breads, green salads (taste like Sage) Mint-Tea, lamb, sherbet, vegetables, desserts, carrots,  cabbage Mustard, Dry or Seed-Cheese, eggs, meats, vegetables, poultry Nutmeg-Baked goods, fruit,  chicken, eggs, vegetables, desserts Onion Powder-Meat, fish, poultry, vegetables, cheese, eggs, bread, rice salads (Do not use   Onion salt) Orange Extract-Desserts, baked goods Oregano-Pasta, eggs, cheese, onions, pork, lamb, fish, chicken, vegetables, green salads Paprika-Meat, fish, poultry, eggs, cheese, vegetables Parsley Flakes-Butter, vegetables, meat fish, poultry, eggs, bread, salads (certain forms may   Contain sodium Pepper-Meat fish, poultry, vegetables, eggs Peppermint Extract-Desserts, baked goods Poppy Seed-Eggs, bread, cheese, fruit dressings, baked goods, noodles, vegetables, cottage  [employer], poultry, meat, fish, cauliflower, turnips,eggs bread Saffron-Rice, bread, veal, chicken, fish, eggs Sage-Meat, fish, poultry, onions, eggplant, tomateos, pork, stews Savory-Eggs, salads, poultry, meat, rice, vegetables, soups, pork Tarragon-Meat, poultry, fish, eggs, butter, vegetables (licorice-like flavor)  Thyme-Meat, poultry, fish, eggs, vegetables, (clover-like flavor), sauces, soups Tumeric-Salads, butter, eggs, fish, rice, vegetables (saffron-like flavor) Vanilla Extract-Baked goods, candy Vinegar-Salads, vegetables, meat marinades Walnut Extract-baked goods, candy   2. Choose your Foods Wisely   The following is a list of foods to avoid which are high in sodium:  Meats-Avoid all smoked, canned, salt cured, dried and kosher meat and fish as well as Anchovies   [employer] meats:Bologna, Liverwurst, Pastrami Canned meat or fish  Marinated herring Caviar    Pepperoni Corned Beef   Pizza Dried chipped beef  Salami Frozen breaded fish or meat Salt pork Frankfurters or hot dogs  Sardines Gefilte fish   Sausage Ham (boiled ham, Proscuitto Smoked butt    spiced ham)   Spam      TV Dinners Vegetables Canned vegetables  (Regular) Relish Canned mushrooms  Sauerkraut Olives    Tomato juice Pickles  Bakery and Dessert Products Canned puddings  Cream pies Cheesecake   Decorated cakes Cookies  Beverages/Juices Tomato juice, regular  Gatorade   V-8 vegetable juice, regular  Breads and Cereals Biscuit mixes   Salted potato chips, corn chips, pretzels Bread stuffing mixes  Salted crackers and rolls Pancake and waffle mixes Self-rising flour  Seasonings Accent    Meat sauces Barbecue sauce  Meat tenderizer Catsup    Monosodium glutamate (MSG) Celery salt   Onion salt Chili sauce   Prepared mustard Garlic salt   Salt, seasoned salt, sea salt Gravy mixes   Soy sauce Horseradish   Steak sauce Ketchup   Tartar sauce Lite salt    Teriyaki sauce Marinade mixes   Worcestershire sauce  Others Baking powder   Cocoa and cocoa mixes Baking soda   Commercial casserole mixes Candy-caramels, chocolate  Dehydrated soups    Bars, fudge,nougats  Instant rice and pasta mixes Canned broth or soup  Maraschino cherries Cheese, aged and processed cheese and cheese spreads  Learning Assessment Quiz  Indicated T (for True) or F (for False) for each of the following statements:  _____ Fresh fruits and vegetables and unprocessed grains are generally low in sodium _____ Water may contain a considerable amount of sodium, depending on the source _____ You can always tell if a food is high in sodium by tasting it _____ Certain laxatives my be high in sodium and should be avoided unless prescribed   by a physician or pharmacist _____ Salt substitutes may be used freely by anyone on a sodium restricted diet _____ Sodium is present in table salt, food additives and as a natural component of   most foods _____ Table salt is approximately 90% sodium _____ Limiting sodium intake may help prevent excess fluid accumulation in the body _____ On a sodium-restricted diet, seasonings such as bouillon soy sauce, and  cooking wine  should be used in place of table salt _____ On an ingredient list, a product which lists monosodium glutamate as the first   ingredient is an appropriate food to include on a low sodium diet  Circle the best answer(s) to the following statements (Hint: there may be more than one correct answer)  11. On a low-sodium diet, some acceptable snack items are:    A. Olives  F. Bean dip   K. Grapefruit juice    B. Salted Pretzels G. Commercial Popcorn   L. Canned peaches    C. Carrot Sticks  H. Bouillon   M. Unsalted nuts   D. [place] fries  I. Peanut butter crackers N. Salami   E. Sweet pickles J. Tomato Juice   O. Pizza  12.  Seasonings that may be used freely on a reduced - sodium diet include   A. Lemon wedges F.Monosodium glutamate K. Celery seed    B.Soysauce   G. Pepper   L. Mustard powder   C. Sea salt  H. Cooking wine  M. Onion flakes   D. Vinegar  E. Prepared horseradish N. Salsa   E. Sage   J. Worcestershire sauce  O. Chutney

## 2022-08-28 ENCOUNTER — Ambulatory Visit: Payer: Self-pay | Admitting: *Deleted

## 2022-08-28 NOTE — Patient Outreach (Signed)
  Care Coordination   08/28/2022 Name: Xavier Caldwell MRN: [id number] DOB: 07-11-1950   Care Coordination Outreach Attempts:  An unsuccessful telephone outreach was attempted for a scheduled appointment today.  Follow Up Plan:  Additional outreach attempts will be made to offer the patient care coordination information and services.   Encounter Outcome:  No Answer. Left HIPAA compliant VM.   Care Coordination Interventions:  No, not indicated    Demetrios Loll, BSN, RN-BC RN Care Coordinator [facility]  [facility] Direct Dial: [phone] Main #: [phone]

## 2022-09-20 ENCOUNTER — Encounter: Payer: Self-pay | Admitting: *Deleted

## 2022-09-20 ENCOUNTER — Ambulatory Visit: Payer: Self-pay | Admitting: *Deleted

## 2022-09-20 NOTE — Patient Outreach (Signed)
Care Coordination   Follow Up Visit Note   09/20/2022 Name: Xavier Caldwell MRN: [id number] DOB: November 01, 1950  Xavier Caldwell is a 72 y.o. year old male who sees Xavier Nevins, MD for primary care. I spoke with  Xavier Caldwell by phone today.  What matters to the patients health and wellness today?  Managing sore on gum and having dentures refitted    Goals Addressed             This Visit's Progress    COMPLETED: Increase Knowledge of Thryoid Nodules and Treatment Options       Care Coordination Goals: Patient will keep follow-up appointment with endocrinologist on 08/19/22 Patient will reach out to endocrinologist with any new or worsening symptoms related to thyroid function and multinodular goiter Patient will reach out to RN Care Coordinator with any care coordinator or resource needs [phone]  Goal completed. Patient is scheduled for a follow-up with endocrinologist on 02/18/22.     Manage Blood Pressure       Care Coordination Goals: Patient will continue to monitor blood pressure daily and will call provider with any readings outside of recommended range Patient will reach out to RN Care Coordinator at [phone] with any resource or care coordination needs     Manage Blood Sugar   Not on track    Care Coordination Goals: Patient will continue to monitor and record blood sugar twice a day and will call PCP with any readings less than 70 or greater than 200 Patient will review handouts on hypoglycemia and diabetes nutrition Patient will reach out to RN Care Coordinator at [phone] with any resource or care coordination needs     Manage Sore on Gum       Care Coordination Needs: Patient will follow-up with dentist on Monday for denture fitting and will reach out to dentist with any new or worsening symptoms Patient will avoid any spicy or acidic foods Patient will reach out to RN Care Coordinator at [phone] with any resource or care coordination needs      COMPLETED: Repeat Thyroid Biopsy and Follow-up Re: Results       Care Coordination Services: Patient will keep appointment for repeat thyroid biopsy Patient will follow pre and post procedure instructions Patient will reach out to provider with any complications post biopsy Patient will f/u with endocrinologist regarding results and treatment options Patient will reach out to RN Care Coordinator [phone] with any resource or care coordination needs  Goal completed. Patient will follow-up with endocrinologist on 02/19/23.        SDOH assessments and interventions completed:  Yes  SDOH Interventions Today    Flowsheet Row Most Recent Value  SDOH Interventions   Food Insecurity Interventions Intervention Not Indicated  Transportation Interventions Intervention Not Indicated  Physical Activity Interventions Intervention Not Indicated        Care Coordination Interventions:  Yes, provided  Interventions Today    Flowsheet Row Most Recent Value  Chronic Disease   Chronic disease during today's visit Diabetes, Hypertension (HTN)  General Interventions   General Interventions Discussed/Reviewed General Interventions Discussed, General Interventions Reviewed, Durable Medical Equipment (DME), Labs, Doctor Visits  [Currently has a sore on bottom gum due to new dentures. Had teeth pulled 1.5 weeks ago. Has F/U with dentist on Mon 09/23/22. Swishing with warm salt water and sore is improving. Unable to eat normally or chew.]  Labs Hgb A1c every 6 months  Doctor Visits Discussed/Reviewed Doctor Visits Discussed, Doctor Visits  Reviewed, PCP, Specialist  Xavier Caldwell PCP visit was 05/27/22. Last cardiology visit was 08/27/22. Reviewed and discussed cardiology visit. Patient complained of fatigue but that has improved. Home blood pressure readings are stable.]  Durable Medical Equipment (DME) BP Cuff, Glucomoter  [Blood pressure this morning 119/67, yesterday 135/81. Blood sugar was 68 last night after  work. Ate oatmeal cookies. 230 this morning. Used insulin as directed. Has not rechecked. Encouraged to do so. Usually higher in the evening & normal in the morning]  PCP/Specialist Visits Compliance with follow-up visit  [02/20/23 with Xavier Caldwell (cardiology), Follow up with PCP every 6 months for blood sugar management. Call PCP with blood sugar readings less than 70 or greater than 200. 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 Call dentist with new or worsening symptoms.]  Labs Reviewed Hgb A1c  [05/16/22 A1C 8.5%]  Nutrition Interventions   Nutrition Discussed/Reviewed Nutrition Discussed, Nutrition Reviewed, Carbohydrate meal planning, Supplemental nutrition, Increasing proteins, Fluid intake, Decreasing sugar intake  [Eating soft foods due to sore on gum. Foods are higher in sugar than his normal diet. Ensure Light & a cup of peaches for supper last night. Encouraged soft proteins like low sugar peanut butter & tuna. Be mindful of sodium content. Less than 2G per day.]  Pharmacy Interventions   Pharmacy Dicussed/Reviewed Pharmacy Topics Discussed, Pharmacy Topics Reviewed, Medications and their functions  [Amaryl 4 mg and Novolin 25 units BID]  Safety Interventions   Safety Discussed/Reviewed Safety Discussed, Safety  Reviewed  [risk for hypoglycemia]       Follow up plan: Follow up call scheduled for 09/25/22    Encounter Outcome:  Pt. Visit Completed   Demetrios Loll, BSN, RN-BC RN Care Coordinator [facility]  [facility] Direct Dial: [phone] Main #: [phone]

## 2022-09-25 ENCOUNTER — Ambulatory Visit: Payer: Self-pay | Admitting: *Deleted

## 2022-09-25 NOTE — Patient Outreach (Signed)
  Care Coordination   09/25/2022 Name: Xavier Caldwell MRN: [id number] DOB: 09-07-1950   Care Coordination Outreach Attempts:  An unsuccessful telephone outreach was attempted for a scheduled appointment today.  Follow Up Plan:  Additional outreach attempts will be made to offer the patient care coordination information and services.   Encounter Outcome:  No Answer. Left HIPAA compliant VM.   Care Coordination Interventions:  No, not indicated    Demetrios Loll, BSN, RN-BC RN Care Coordinator [facility]  [facility] Direct Dial: [phone] Main #: [phone]

## 2022-10-05 DIAGNOSIS — I130 Hypertensive heart and chronic kidney disease with heart failure and stage 1 through stage 4 chronic kidney disease, or unspecified chronic kidney disease: Secondary | ICD-10-CM | POA: Diagnosis not present

## 2022-10-05 DIAGNOSIS — E1165 Type 2 diabetes mellitus with hyperglycemia: Secondary | ICD-10-CM | POA: Diagnosis not present

## 2022-10-10 ENCOUNTER — Other Ambulatory Visit: Payer: Self-pay | Admitting: Cardiology

## 2022-10-30 DIAGNOSIS — Z08 Encounter for follow-up examination after completed treatment for malignant neoplasm: Secondary | ICD-10-CM | POA: Diagnosis not present

## 2022-10-30 DIAGNOSIS — Z1283 Encounter for screening for malignant neoplasm of skin: Secondary | ICD-10-CM | POA: Diagnosis not present

## 2022-10-30 DIAGNOSIS — X32XXXD Exposure to sunlight, subsequent encounter: Secondary | ICD-10-CM | POA: Diagnosis not present

## 2022-10-30 DIAGNOSIS — Z85820 Personal history of malignant melanoma of skin: Secondary | ICD-10-CM | POA: Diagnosis not present

## 2022-10-30 DIAGNOSIS — L570 Actinic keratosis: Secondary | ICD-10-CM | POA: Diagnosis not present

## 2022-10-30 DIAGNOSIS — D225 Melanocytic nevi of trunk: Secondary | ICD-10-CM | POA: Diagnosis not present

## 2022-11-04 DIAGNOSIS — I130 Hypertensive heart and chronic kidney disease with heart failure and stage 1 through stage 4 chronic kidney disease, or unspecified chronic kidney disease: Secondary | ICD-10-CM | POA: Diagnosis not present

## 2022-11-04 DIAGNOSIS — E1165 Type 2 diabetes mellitus with hyperglycemia: Secondary | ICD-10-CM | POA: Diagnosis not present

## 2022-11-26 DIAGNOSIS — N183 Chronic kidney disease, stage 3 unspecified: Secondary | ICD-10-CM | POA: Diagnosis not present

## 2022-11-26 DIAGNOSIS — F419 Anxiety disorder, unspecified: Secondary | ICD-10-CM | POA: Diagnosis not present

## 2022-11-26 DIAGNOSIS — E1129 Type 2 diabetes mellitus with other diabetic kidney complication: Secondary | ICD-10-CM | POA: Diagnosis not present

## 2022-11-26 DIAGNOSIS — E1122 Type 2 diabetes mellitus with diabetic chronic kidney disease: Secondary | ICD-10-CM | POA: Diagnosis not present

## 2022-11-26 DIAGNOSIS — E1165 Type 2 diabetes mellitus with hyperglycemia: Secondary | ICD-10-CM | POA: Diagnosis not present

## 2022-11-26 DIAGNOSIS — Z23 Encounter for immunization: Secondary | ICD-10-CM | POA: Diagnosis not present

## 2022-11-26 DIAGNOSIS — Z6831 Body mass index (BMI) 31.0-31.9, adult: Secondary | ICD-10-CM | POA: Diagnosis not present

## 2022-11-26 DIAGNOSIS — G629 Polyneuropathy, unspecified: Secondary | ICD-10-CM | POA: Diagnosis not present

## 2022-11-26 DIAGNOSIS — E6609 Other obesity due to excess calories: Secondary | ICD-10-CM | POA: Diagnosis not present

## 2022-11-26 DIAGNOSIS — E1140 Type 2 diabetes mellitus with diabetic neuropathy, unspecified: Secondary | ICD-10-CM | POA: Diagnosis not present

## 2022-11-26 DIAGNOSIS — I130 Hypertensive heart and chronic kidney disease with heart failure and stage 1 through stage 4 chronic kidney disease, or unspecified chronic kidney disease: Secondary | ICD-10-CM | POA: Diagnosis not present

## 2022-11-26 DIAGNOSIS — I10 Essential (primary) hypertension: Secondary | ICD-10-CM | POA: Diagnosis not present

## 2022-12-05 DIAGNOSIS — I130 Hypertensive heart and chronic kidney disease with heart failure and stage 1 through stage 4 chronic kidney disease, or unspecified chronic kidney disease: Secondary | ICD-10-CM | POA: Diagnosis not present

## 2022-12-05 DIAGNOSIS — E1129 Type 2 diabetes mellitus with other diabetic kidney complication: Secondary | ICD-10-CM | POA: Diagnosis not present

## 2022-12-25 ENCOUNTER — Encounter: Payer: Self-pay | Admitting: *Deleted

## 2023-01-04 DIAGNOSIS — I130 Hypertensive heart and chronic kidney disease with heart failure and stage 1 through stage 4 chronic kidney disease, or unspecified chronic kidney disease: Secondary | ICD-10-CM | POA: Diagnosis not present

## 2023-01-04 DIAGNOSIS — E1140 Type 2 diabetes mellitus with diabetic neuropathy, unspecified: Secondary | ICD-10-CM | POA: Diagnosis not present

## 2023-01-04 DIAGNOSIS — E1122 Type 2 diabetes mellitus with diabetic chronic kidney disease: Secondary | ICD-10-CM | POA: Diagnosis not present

## 2023-01-20 ENCOUNTER — Ambulatory Visit (HOSPITAL_COMMUNITY)
Admission: RE | Admit: 2023-01-20 | Discharge: 2023-01-20 | Disposition: A | Discharge status: Home / Self Care | Payer: Medicare HMO | Source: Ambulatory Visit | Attending: Nurse Practitioner | Admitting: Nurse Practitioner

## 2023-01-20 DIAGNOSIS — E042 Nontoxic multinodular goiter: Secondary | ICD-10-CM | POA: Insufficient documentation

## 2023-02-04 DIAGNOSIS — E1140 Type 2 diabetes mellitus with diabetic neuropathy, unspecified: Secondary | ICD-10-CM | POA: Diagnosis not present

## 2023-02-04 DIAGNOSIS — I10 Essential (primary) hypertension: Secondary | ICD-10-CM | POA: Diagnosis not present

## 2023-02-12 DIAGNOSIS — M5030 Other cervical disc degeneration, unspecified cervical region: Secondary | ICD-10-CM | POA: Diagnosis not present

## 2023-02-12 DIAGNOSIS — N183 Chronic kidney disease, stage 3 unspecified: Secondary | ICD-10-CM | POA: Diagnosis not present

## 2023-02-12 DIAGNOSIS — Z6833 Body mass index (BMI) 33.0-33.9, adult: Secondary | ICD-10-CM | POA: Diagnosis not present

## 2023-02-12 DIAGNOSIS — G243 Spasmodic torticollis: Secondary | ICD-10-CM | POA: Diagnosis not present

## 2023-02-12 DIAGNOSIS — E1140 Type 2 diabetes mellitus with diabetic neuropathy, unspecified: Secondary | ICD-10-CM | POA: Diagnosis not present

## 2023-02-12 DIAGNOSIS — I4891 Unspecified atrial fibrillation: Secondary | ICD-10-CM | POA: Diagnosis not present

## 2023-02-12 DIAGNOSIS — E042 Nontoxic multinodular goiter: Secondary | ICD-10-CM | POA: Diagnosis not present

## 2023-02-12 DIAGNOSIS — E1129 Type 2 diabetes mellitus with other diabetic kidney complication: Secondary | ICD-10-CM | POA: Diagnosis not present

## 2023-02-12 DIAGNOSIS — E1122 Type 2 diabetes mellitus with diabetic chronic kidney disease: Secondary | ICD-10-CM | POA: Diagnosis not present

## 2023-02-12 DIAGNOSIS — I10 Essential (primary) hypertension: Secondary | ICD-10-CM | POA: Diagnosis not present

## 2023-02-12 DIAGNOSIS — E6609 Other obesity due to excess calories: Secondary | ICD-10-CM | POA: Diagnosis not present

## 2023-02-12 DIAGNOSIS — I700 Atherosclerosis of aorta: Secondary | ICD-10-CM | POA: Diagnosis not present

## 2023-02-12 DIAGNOSIS — I130 Hypertensive heart and chronic kidney disease with heart failure and stage 1 through stage 4 chronic kidney disease, or unspecified chronic kidney disease: Secondary | ICD-10-CM | POA: Diagnosis not present

## 2023-02-13 LAB — TSH: TSH: 0.441 u[IU]/mL — ABNORMAL LOW (ref 0.450–4.500)

## 2023-02-13 LAB — T3, FREE: T3, Free: 3 pg/mL (ref 2.0–4.4)

## 2023-02-13 LAB — T4, FREE: Free T4: 1.12 ng/dL (ref 0.82–1.77)

## 2023-02-18 NOTE — Patient Instructions (Signed)

## 2023-02-19 ENCOUNTER — Ambulatory Visit: Payer: Medicare HMO | Admitting: Nurse Practitioner

## 2023-02-19 ENCOUNTER — Encounter: Payer: Self-pay | Admitting: Nurse Practitioner

## 2023-02-19 ENCOUNTER — Ambulatory Visit (INDEPENDENT_AMBULATORY_CARE_PROVIDER_SITE_OTHER): Payer: Medicare HMO | Admitting: Nurse Practitioner

## 2023-02-19 VITALS — BP 130/76 | HR 81 | Ht 71.0 in | Wt 231.4 lb

## 2023-02-19 DIAGNOSIS — E1122 Type 2 diabetes mellitus with diabetic chronic kidney disease: Secondary | ICD-10-CM | POA: Diagnosis not present

## 2023-02-19 DIAGNOSIS — E042 Nontoxic multinodular goiter: Secondary | ICD-10-CM

## 2023-02-19 DIAGNOSIS — Z794 Long term (current) use of insulin: Secondary | ICD-10-CM | POA: Diagnosis not present

## 2023-02-19 DIAGNOSIS — E782 Mixed hyperlipidemia: Secondary | ICD-10-CM | POA: Diagnosis not present

## 2023-02-19 DIAGNOSIS — N182 Chronic kidney disease, stage 2 (mild): Secondary | ICD-10-CM

## 2023-02-19 DIAGNOSIS — I10 Essential (primary) hypertension: Secondary | ICD-10-CM

## 2023-02-19 MED ORDER — METFORMIN HCL 500 MG PO TABS: 500.0000 mg | ORAL_TABLET | Freq: Two times a day (BID) | ORAL | 3 refills | Status: DC

## 2023-02-19 MED ORDER — GLIMEPIRIDE 4 MG PO TABS: 4.0000 mg | ORAL_TABLET | Freq: Every day | ORAL | 3 refills | Status: DC

## 2023-02-19 NOTE — Progress Notes (Signed)
 Endocrinology Follow Up Note 02/19/23    ---------------------------------------------------------------------------------------------------------------------- Subjective    Past Medical History:  Diagnosis Date   Acute systolic (congestive) heart failure (HCC)    Anxiety    Arthritis    Atrial fibrillation (HCC)    Back pain    Chronic kidney disease    pt told by PA of Dr. Monte Antonio that he had "stage 3 kidney so they had me stop taking Metformin "   Coronary artery disease    Diabetes mellitus without complication (HCC)    High cholesterol    History of gout    Hypertension    Myocardial infarction (HCC) 04/2018   Neuropathy    PAD (peripheral artery disease) (HCC)    Retinal detachment    One on the right and two on the left   Sleep apnea    Stop Bang score of 4   STEMI (ST elevation myocardial infarction) (HCC)    04/08/2018 PCI/DES RCA   Stroke [facility])    February 2022   VT (ventricular tachycardia) [facility])     Past Surgical History:  Procedure Laterality Date   APPENDECTOMY     CARDIOVERSION N/A 04/13/2018   Procedure: CARDIOVERSION;  Surgeon: Maudine Sos, MD;  Location: [facility] ENDOSCOPY;  Service: Cardiovascular;  Laterality: N/A;   CATARACT EXTRACTION W/PHACO Right 04/25/2014   Procedure: CATARACT EXTRACTION PHACO AND INTRAOCULAR LENS PLACEMENT (IOC);  Surgeon: Clay Cummins, MD;  Location: [facility] ORS;  Service: Ophthalmology;  Laterality: Right;  CDE:4.70   CATARACT EXTRACTION W/PHACO Left 07/11/2014   Procedure: CATARACT EXTRACTION PHACO AND INTRAOCULAR LENS PLACEMENT LEFT EYE CDE=8.32;  Surgeon: Clay Cummins, MD;  Location: [facility] ORS;  Service: Ophthalmology;  Laterality: Left;   COLONOSCOPY WITH PROPOFOL  N/A 09/23/2016   Rourk: 9 polyps removed, multiple tubular adenomas. recommended 3 yr surveillance   COLONOSCOPY WITH PROPOFOL  N/A 12/27/2019   ten sessile polyps, 4-9 mm in size in descending, hepatic flexure, and cecum, 11 mm polyp in sigmoid, three sessile polyps in  rectum and mid rectum 2-3 mm in size. Tubular adenomas and hyperplastic. 3 year surveillance   CORONARY/GRAFT ACUTE MI REVASCULARIZATION N/A 04/07/2018   Procedure: Coronary/Graft Acute MI Revascularization;  Surgeon: Odie Benne, MD;  Location: [facility] INVASIVE CV LAB;  Service: Cardiovascular;  Laterality: N/A;   ENDARTERECTOMY Right 03/29/2020   Procedure: RIGHT CAROTID ARTERY ENDARTERECTOMY WITH PATCH ANGIOPLASTY;  Surgeon: Young Hensen, MD;  Location: [facility] OR;  Service: Vascular;  Laterality: Right;   ENDARTERECTOMY Left 09/11/2020   Procedure: ENDARTERECTOMY CAROTID LEFT;  Surgeon: Young Hensen, MD;  Location: [facility] OR;  Service: Vascular;  Laterality: Left;   LEFT HEART CATH AND CORONARY ANGIOGRAPHY N/A 04/07/2018   Procedure: LEFT HEART CATH AND CORONARY ANGIOGRAPHY;  Surgeon: Odie Benne, MD;  Location: [facility] INVASIVE CV LAB;  Service: Cardiovascular;  Laterality: N/A;   MELANOMA EXCISION  07/2019   PATCH ANGIOPLASTY Left 09/11/2020   Procedure: PATCH ANGIOPLASTY;  Surgeon: Young Hensen, MD;  Location: [facility] OR;  Service: Vascular;  Laterality: Left;   POLYPECTOMY  09/23/2016   Procedure: POLYPECTOMY;  Surgeon: Suzette Espy, MD;  Location: [facility] ENDO SUITE;  Service: Endoscopy;;  colon   POLYPECTOMY  12/27/2019   Procedure: POLYPECTOMY;  Surgeon: Suzette Espy, MD;  Location: [facility] ENDO SUITE;  Service: Endoscopy;;   RETINAL DETACHMENT SURGERY      Social History   Socioeconomic History   Marital status: Married    Spouse name: Not on file   Number of  children: Not on file   Years of education: Not on file   Highest education level: Not on file  Occupational History   Not on file  Tobacco Use   Smoking status: Former    Current packs/day: 0.00    Average packs/day: 1.5 packs/day for 30.0 years (45.0 ttl pk-yrs)    Types: Cigarettes    Start date: 07/06/1982    Quit date: 07/05/2012    Years since quitting: 10.6    Passive exposure: Never   Smokeless  tobacco: Never  Vaping Use   Vaping status: Never Used  Substance and Sexual Activity   Alcohol use: No   Drug use: No   Sexual activity: Not Currently    Birth control/protection: None  Other Topics Concern   Not on file  Social History Narrative   Not on file   Social Drivers of Health   Financial Resource Strain: Low Risk  (06/06/2022)   Overall Financial Resource Strain (CARDIA)    Difficulty of Paying Living Expenses: Not hard at all  Food Insecurity: No Food Insecurity (09/20/2022)   Hunger Vital Sign    Worried About Running Out of Food in the Last Year: Never true    Ran Out of Food in the Last Year: Never true  Transportation Needs: No Transportation Needs (09/20/2022)   PRAPARE - Administrator, Civil Service (Medical): No    Lack of Transportation (Non-Medical): No  Physical Activity: Sufficiently Active (09/20/2022)   Exercise Vital Sign    Days of Exercise per Week: 5 days    Minutes of Exercise per Session: 60 min  Stress: No Stress Concern Present (06/26/2022)   [employer] of Occupational Health - Occupational Stress Questionnaire    Feeling of Stress : Only a little  Social Connections: Socially Integrated (12/07/2021)   Social Connection and Isolation Panel [NHANES]    Frequency of Communication with Friends and Family: More than three times a week    Frequency of Social Gatherings with Friends and Family: More than three times a week    Attends Religious Services: More than 4 times per year    Active Member of [employer] or Organizations: Yes    Attends Engineer, structural: More than 4 times per year    Marital Status: Married  Catering manager Violence: Not on file    Current Outpatient Medications on File Prior to Visit  Medication Sig Dispense Refill   ALPRAZolam  (XANAX ) 0.5 MG tablet Take 0.5 mg by mouth 2 (two) times daily as needed for anxiety.     amLODipine  (NORVASC ) 10 MG tablet Take 1 tablet (10 mg total) by mouth daily. 30  tablet 1   apixaban  (ELIQUIS ) 5 MG TABS tablet Take 1 tablet (5 mg total) by mouth 2 (two) times daily. Restart medication tomorrow, 09/12/2020 60 tablet 6   atorvastatin  (LIPITOR ) 80 MG tablet TAKE ONE TABLET BY MOUTH EVERYDAY AT BEDTIME 90 tablet 2   carvedilol  (COREG ) 12.5 MG tablet Take 1 tablet (12.5 mg total) by mouth 2 (two) times daily. 180 tablet 3   DULoxetine  (CYMBALTA ) 60 MG capsule Take 60 mg by mouth daily with lunch.      ezetimibe  (ZETIA ) 10 MG tablet Take 1 tablet (10 mg total) by mouth daily. 30 tablet 5   furosemide  (LASIX ) 20 MG tablet Take 20-40 mg by mouth daily as needed for fluid.     gabapentin  (NEURONTIN ) 300 MG capsule Take 300 mg by mouth 2 (two) times daily.  glucose blood (ONETOUCH ULTRA) test strip USE ONE TEST STRIP AS DIRECTED TWICE DAILY 100 each 0   hydrALAZINE  (APRESOLINE ) 50 MG tablet TAKE 1 TABLET BY MOUTH 3 TIMES DAILY 90 tablet 10   Insulin  Degludec (TRESIBA  FLEXTOUCH ) Inject 40 Units into the skin at bedtime.     mupirocin  ointment (BACTROBAN ) 2 % Apply 1 application topically daily as needed (melonoma).     nitroGLYCERIN  (NITROSTAT ) 0.4 MG SL tablet Place 1 tablet (0.4 mg total) under the tongue every 5 (five) minutes as needed. (Patient taking differently: Place 0.4 mg under the tongue every 5 (five) minutes as needed for chest pain.) 25 tablet 2   polyethylene glycol (MIRALAX  / GLYCOLAX ) 17 g packet Take 17 g by mouth 2 (two) times daily. 60 each 1   rOPINIRole  (REQUIP ) 1 MG tablet Take 1 mg by mouth 4 (four) times daily.      timolol  (TIMOPTIC ) 0.5 % ophthalmic solution INSTILL 1 DROP INTO EACH EYE TWICE DAILY (Patient taking differently: Place 1 drop into both eyes 2 (two) times daily.) 10 mL 10   UNIFINE PENTIPS 31G X 5 MM MISC USE TO INJECT INSULIN  AT BEDTIME (Patient taking differently: at bedtime.) 100 each 5   No current facility-administered medications on file prior to visit.      HPI  Diabetes He presents for his initial diabetic  visit. He has type 2 diabetes mellitus. Onset time: diagnosed at approx age of 25. His disease course has been fluctuating. Hypoglycemia symptoms include nervousness/anxiousness, sweats and tremors. Associated symptoms include blurred vision (hx detached retina) and fatigue. Hypoglycemia complications include nocturnal hypoglycemia. Symptoms are stable. Diabetic complications include a CVA and heart disease (previous MI). Risk factors for coronary artery disease include diabetes mellitus, dyslipidemia, family history, obesity, male sex and hypertension. Current diabetic treatment includes insulin  injections and oral agent (dual therapy). He is compliant with treatment most of the time. His weight is fluctuating minimally. He is following a generally unhealthy diet. When asked about meal planning, he reported none. He has not had a previous visit with a dietitian. He participates in exercise daily. (He presents today with his logs showing fluctuating fasting glucose.  His most recent A1c on 10/22 was 10.7%.  He monitors glucose 1-2 times per day.  He drinks mostly ICE flavored waters and coffee.  He eats 2 meals per day and occasionally snacks on fruit cups and SF puddings.  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  He is UTD on eye exam, has seen podiatry in the past.  He does have quite frequent hypoglycemia noted.) An ACE inhibitor/angiotensin II receptor blocker is not being taken. He sees a podiatrist.Eye exam is current.    Xavier Caldwell is a 73 y.o.-year-old male, referred by his PCP, Dr. Lewayne Records, for evaluation for multinodular goiter and now for uncontrolled diabetes management.  His nodule was incidentally found on CT chest following up on possible lung nodule.  He was then sent for thyroid  ultrasound to assess in depth. And subsequently was sent for FNA of mildly suspicious thyroid  nodule.  Unfortunately, the specimen obtained was not enough to rule out  malignancy, thus needed to be repeated.  The repeat biopsy was sufficient to determine benignity.     I reviewed pt's thyroid  tests: Lab Results  Component Value Date   TSH 0.441 (L) 02/12/2023   TSH 0.601 05/22/2022   TSH 0.434 (L) 03/04/2022   TSH 0.17 (  L) 09/21/2020   TSH 0.166 (L) 02/29/2020   TSH 0.668 04/17/2018   TSH 0.547 04/11/2018   TSH 0.314 (L) 01/07/2017   TSH 0.410 (L) 01/01/2016   FREET4 1.12 02/12/2023   FREET4 1.31 05/22/2022   FREET4 0.99 03/04/2022   FREET4 0.82 09/21/2020   FREET4 1.20 02/29/2020   FREET4 1.13 01/07/2017   FREET4 1.05 01/01/2016    No FH of thyroid  ds. No FH of thyroid  cancer. No h/o radiation tx to head or neck.  No seaweed or kelp. No recent contrast studies. No steroid use. No herbal supplements. No Biotin supplements or Hair, Skin and Nails vitamins.  Pt also has a history of DM, HLD, PVD, Afib, CAD  with MI, HTN.  Review of systems  Constitutional: + Minimally fluctuating body weight,  current Body mass index is 32.27 kg/m. , no fatigue, no subjective hyperthermia, no subjective hypothermia Eyes: no blurry vision, no xerophthalmia ENT: no sore throat, no nodules palpated in throat, no dysphagia/odynophagia, no hoarseness Cardiovascular: no chest pain, no shortness of breath, no palpitations, no leg swelling Respiratory: no cough, no shortness of breath Gastrointestinal: no nausea/vomiting/diarrhea Musculoskeletal: no muscle/joint aches Skin: no rashes, no hyperemia Neurological: no tremors, no numbness, no tingling, no dizziness Psychiatric: no depression, no anxiety  ---------------------------------------------------------------------------------------------------------------------- Objective    BP 130/76 (BP Location: Right Arm, Patient Position: Sitting, Cuff Size: Large)   Pulse 81   Ht 5\' 11"  (1.803 m)   Wt 231 lb 6.4 oz (105 kg)   BMI 32.27 kg/m    BP Readings from Last 3 Encounters:  02/19/23 130/76  08/27/22  116/60  08/19/22 120/75    Wt Readings from Last 3 Encounters:  02/19/23 231 lb 6.4 oz (105 kg)  08/27/22 233 lb (105.7 kg)  08/19/22 229 lb (103.9 kg)     Physical Exam- Limited  Constitutional:  Body mass index is 32.27 kg/m. , not in acute distress, normal state of mind Eyes:  EOMI, no exophthalmos Musculoskeletal: no gross deformities, strength intact in all four extremities, no gross restriction of joint movements Skin:  no rashes, no hyperemia Neurological: no tremor with outstretched hands    Thyroid  Ultrasound from 01/21/22 CLINICAL DATA:  Incidental on CT.   EXAM: THYROID  ULTRASOUND   TECHNIQUE: Ultrasound examination of the thyroid  gland and adjacent soft tissues was performed.   COMPARISON:  CT chest January 14, 2022   FINDINGS: Parenchymal Echotexture: Moderately heterogenous   Isthmus: 1.1 cm   Right lobe: 7.8 x 3.1 x 3.0 cm   Left lobe: 6.6 x 3.2 x 2.8 cm   _________________________________________________________   Estimated total number of nodules >/= 1 cm:   Number of spongiform nodules >/=  2 cm not described below (TR1): 0   Number of mixed cystic and solid nodules >/= 1.5 cm not described below (TR2): 0   _________________________________________________________   Nodule labeled 4 appears to be a mixed cystic and solid isoechoic TR 2 nodule in the thyroid  isthmus measuring 1.5 cm. This nodule does NOT meet TI-RADS criteria for biopsy or dedicated follow-up.   Nodule labeled 1 is a mixed cystic and solid isoechoic TR 2 nodule in the superior right thyroid  lobe measuring 1.7 cm. This nodule does NOT meet TI-RADS criteria for biopsy or dedicated follow-up.   Nodule labeled 2 is a mixed cystic and solid isoechoic TR 2 nodule in the mid right thyroid  lobe measuring 1.1 cm. This nodule does NOT meet TI-RADS criteria for biopsy or dedicated follow-up.  Nodule labeled 3 appears to be best described as a solid isoechoic TR 3 nodule in  the inferior right thyroid  lobe measuring 3.7 x 3.2 x 3.0 cm. **Given size (>/= 2.5 cm) and appearance, fine needle aspiration of this mildly suspicious nodule should be considered based on TI-RADS criteria.   Nodule labeled 5 is a mixed cystic and solid isoechoic TR 2 nodule in the superior left thyroid  lobe measuring 1.5 cm. This nodule does NOT meet TI-RADS criteria for biopsy or dedicated follow-up.   Nodule labeled 6 is a mixed cystic and solid isoechoic TR 2 nodule in the posterior aspect of the mid left thyroid  lobe measuring 1.5 cm. This nodule does NOT meet TI-RADS criteria for biopsy or dedicated follow-up.   IMPRESSION: 1. Enlarged multinodular thyroid  gland. 2. Nodule labeled 3 in the inferior right thyroid  lobe (3.7 cm TR 3) meets criteria for biopsy. 3. A number of additional benign-appearing nodules as described do not meet criteria for further dedicated follow-up or biopsy.   The above is in keeping with the ACR TI-RADS recommendations - J Am Coll Radiol 2017;14:587-595.     Electronically Signed   By: Maryann Smalls M.D.   On: 01/22/2022 14:02  FNA on 03/12/22  CYTOLOGY - NON PAP  CASE: [id number]  PATIENT: Betzalel Lovejoy  Non-Gynecological Cytology Report      Clinical History: 3.7 cm RLL; TR3  Specimen Submitted:  A. THYROID , RLL, FINE NEEDLE ASPIRATION:    FINAL MICROSCOPIC DIAGNOSIS:  - Scant follicular epithelium present (Bethesda category I)   SPECIMEN ADEQUACY:  Satisfactory but limited for evaluation, scant cellularity   DIAGNOSTIC COMMENTS:  Inflammation present.   GROSS:  Received is/are 6 slides in 95% Ethyl alcohol, and 30 ccs of pale pink  Cytolyt solution. (CM:cm)  Prepared:  Smears: 6  Concentration Method (Thin Prep):  1  Cell Block:  Cell block attempted, not obtained.  Additional Studies:  Also there was an Afirma collected.   ------------------------------------------------------------------------------------------------------------- Repeat FNA 06/19/22 CYTOLOGY - NON PAP CASE: [id number] PATIENT: Nishan Steen Non-Gynecological Cytology Report  Clinical History: Thyroid  nodule Specimen Submitted:  A. THYROID , RIGHT LOWER LOBE, FINE NEEDLE ASPIRATION:  FINAL MICROSCOPIC DIAGNOSIS: - Consistent with benign follicular nodule (Bethesda category II)  SPECIMEN ADEQUACY: Satisfactory for evaluation  DIAGNOSTIC COMMENTS: The aspirate shows abundant colloid with scattered follicular groups and macrophages consistent with a benign follicular/colloid nodule (follicular nodular disease).  GROSS: Received is/are 6 slides in 95% Ethyl alcohol, and 30ccs of pale beige Cytolyt solution. (TS:ts) Prepared: Smears:  6 Concentration Method (Thin Prep):  1 Cell Block:  Cell block attempted, not obtained. Additional Studies:  Afirma collected.     Final Diagnosis performed by Judithann Novas, MD.   Electronically signed 06/21/2022 Technical component performed at [facility], [address], [place], [place] [address].  Professional component performed at [employer]. [facility], [address], [place], [place] [address].  Immunohistochemistry Technical component (if applicable) was performed at [facility]. [address], [place], [place] [address].   IMMUNOHISTOCHEMISTRY DISCLAIMER (if applicable): Some of these immunohistochemical stains may have been developed and the performance characteristics determine by [facility]. Some may not have been cleared or approved by the U.S. Food and Drug Administration. The FDA has determined that such clearance or approval is not necessary. This test is used for clinical purposes. It should not be regarded as investigational or for research. This laboratory is certified under the Clinical Laboratory  Improvement  Amendments of 1988 (CLIA-88) as qualified to perform high complexity clinical laboratory testing.  The controls stained appropriately.  ------------------------------------------------------------------------------------------------------------------------- Thyroid  US  from 01/20/23  CLINICAL DATA:  73 year old male with multinodular thyroid    EXAM: THYROID  ULTRASOUND   TECHNIQUE: Ultrasound examination of the thyroid  gland and adjacent soft tissues was performed.   COMPARISON:  01/21/2022, biopsy right inferior thyroid  nodule 03/13/2022   Biopsy right inferior thyroid  nodule 06/19/2022   FINDINGS: Parenchymal Echotexture: Moderately heterogenous   Isthmus: 0.8 cm   Right lobe: 6.2 cm x 2.3 cm x 2.0 cm   Left lobe: 6.7 cm x 2.9 cm x 2.9 cm   _________________________________________________________   Estimated total number of nodules >/= 1 cm: 6-10   Number of spongiform nodules >/=  2 cm not described below (TR1): 0   Number of mixed cystic and solid nodules >/= 1.5 cm not described below (TR2): 0   _________________________________________________________   Nodule labeled 1 in the superior right thyroid , 1.6 cm, previously 1.7 cm. Nodule has clear spongiform characteristics on current and previous and does not meet criteria for surveillance.   Nodule labeled 2 mid right thyroid , 1.4 cm. Nodule has clear spongiform characteristics on the prior and the current and does not meet criteria for surveillance.   Nodule labeled 3 inferior right thyroid , 3.4 cm. Nodule has undergone biopsy on 2 prior occasion, 06/19/2022, 03/13/2022. Assuming benign result no further specific follow-up would be indicated.   Nodule labeled 4 on the current, isthmus, 1.5 cm and unchanged. This nodule has TR 2/cystic characteristics and does not meet criteria for surveillance.   Nodule labeled 5 superior left thyroid , 2.4 cm. Nodule has a differing measurement to the prior of  1.5 cm, attributable to inclusion of heterogeneous tissue adjacent to the nodule on the current. The cystic nodule appears unchanged to the prior and given the TR 2 characteristics does not meet criteria for surveillance.   Nodule labeled 6, inferior left thyroid , 1.5 cm. This nodule remains TR 2/cystic and does not meet criteria for surveillance.   No adenopathy   IMPRESSION: Similar appearance of multi cystic/nodular thyroid  as above.   Assuming benign biopsy of inferior right thyroid  nodule (labeled 3), no further specific follow-up would be indicated.   Above recommendations follow those established by the new ACR TI-RADS criteria (J Am Coll Radiol 2017;14:587-595).     Electronically Signed   By: Myrlene Asper D.O.   On: 01/20/2023 12:46   Latest Reference Range & Units 04/17/18 08:29 02/29/20 10:22 09/21/20 09:30 03/04/22 09:01 05/22/22 09:25 02/12/23 07:48  TSH 0.450 - 4.500 uIU/mL 0.668 0.166 (L) 0.17 (L) 0.434 (L) 0.601 0.441 (L)  Triiodothyronine,Free,Serum 2.0 - 4.4 pg/mL    3.2 3.1 3.0  T4,Free(Direct) 0.82 - 1.77 ng/dL  4.09 8.11 9.14 7.82 9.56  Thyroxine (T4) 4.5 - 12.0 ug/dL 9.2       Thyroperoxidase Ab SerPl-aCnc 0 - 34 IU/mL    10    Thyroglobulin Antibody 0.0 - 0.9 IU/mL    <1.0    (L): Data is abnormally low  ----------------------------------------------------------------------------------------------------------------------  ASSESSMENT / PLAN:  1. Thyroid  Nodule  -He had FNA on 03/12/22 to assess for malignancy of mildly suspicious TR3 nodule which unfortunately only had scant cellularity present, therefore malignancy could not be ruled out.  This biopsy was repeated once inflammation had decreased and was determined to be benign.  The rest of the nodules found on previous ultrasound do not meet criteria for additional follow up.  Will repeat thyroid   ultrasound in December 2024 to assess for any changes.  His thyroid  antibody testing was negative, ruling  out autoimmune thyroid  dysfunction.    His previous thyroid  function tests show euthyroid presentation.  He will not need any anti-thyroid  treatment or thyroid  hormone replacement at this time.  I do recommend at least annual surveillance of thyroid  function tests, will order to be done prior to next visit.  2. 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  His most recent A1c on 10/22 was 10.7%.  He monitors glucose 1-2 times per day.  He drinks mostly ICE flavored waters and coffee.  He eats 2 meals per day and occasionally snacks on fruit cups and SF puddings.  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  He is UTD on eye exam, has seen podiatry in the past.  He does have quite frequent hypoglycemia noted.  - Patient has currently uncontrolled symptomatic type 2 DM since 73 years of age, with most recent A1c of 10.7 %.   -Recent labs reviewed.  - I had a long discussion with him about the progressive nature of diabetes and the pathology behind its complications. -His diabetes is complicated by CVA and MI in the past and he remains at a high risk for more acute and chronic complications which include CAD, CVA, CKD, retinopathy, and neuropathy. These are all discussed in detail with him.  The following Lifestyle Medicine recommendations according to American College of Lifestyle Medicine [facility]) were discussed and offered to patient and he agrees to start the journey:  A. Whole Foods, Plant-based plate comprising of fruits and vegetables, plant-based proteins, whole-grain carbohydrates was discussed in detail with the patient.   A list for source of those nutrients were also provided to the patient.  Patient will use only water  or unsweetened tea for hydration. B.  The need to stay away from risky substances including alcohol, smoking; obtaining 7  to 9 hours of restorative sleep, at least 150 minutes of moderate intensity exercise weekly, the importance of healthy social connections,  and stress reduction techniques were discussed. C.  A full color page of  Calorie density of various food groups per pound showing examples of each food groups was provided to the patient.  - I have counseled him on diet and weight management by adopting a carbohydrate restricted/protein rich diet. Patient is encouraged to switch to unprocessed or minimally processed complex starch and increased protein intake (animal or plant source), fruits, and vegetables. -  He is advised to stick to a routine mealtimes to eat 3 meals a day and avoid unnecessary snacks (to snack only to correct hypoglycemia).   - He acknowledges that there is a room for improvement in his food and drink choices. - Suggestion is made for him to avoid simple carbohydrates from his diet including Cakes, Sweet Desserts, Ice Cream, Soda (diet and regular), Sweet Tea, Candies, Chips, Cookies, Store Bought Juices, Alcohol in Excess of 1-2 drinks a day, Artificial Sweeteners, Coffee Creamer, and "Sugar-free" Products. This will help patient to have more stable blood glucose profile and potentially avoid unintended weight gain.  - He will be scheduled with Penny Crumpton, RDN, CDE for diabetes education.  - I have approached him with the following individualized plan to manage his diabetes and patient agrees:   -He is advised to lower  his Tresiba  to 40 units SQ nightly (switching from lunch time administration).  He gets this medication through PAP with his PCP.  -He is encouraged to start/continue monitoring glucose 4 times daily, before meals and before bed, to log their readings on the clinic sheets provided, and bring them to review at follow up appointment in 3 months.  - He is warned not to take insulin  without proper monitoring per orders. - Adjustment parameters are given to him for hypo and  hyperglycemia in writing. - He is encouraged to call clinic for blood glucose levels less than 70 or above 300 mg /dl. - He is advised to continue Metformin  500 mg po twice daily and Glimepiride  4 mg po daily at breakfast, therapeutically suitable for patient .  - He will be considered for incretin therapy as appropriate next visit.  - Specific targets for  A1c; LDL, HDL, and Triglycerides were discussed with the patient.   FOLLOW UP PLAN: Return in about 3 months (around 05/20/2023) for Diabetes F/U with A1c in office, No previsit labs, Bring meter and logs.       I spent  60  minutes in the care of the patient today including review of labs from CMP, Lipids, Thyroid  Function, Hematology (current and previous including abstractions from other facilities); face-to-face time discussing  his blood glucose readings/logs, discussing hypoglycemia and hyperglycemia episodes and symptoms, medications doses, his options of short and long term treatment based on the latest standards of care / guidelines;  discussion about incorporating lifestyle medicine;  and documenting the encounter. Risk reduction counseling performed per USPSTF guidelines to reduce obesity and cardiovascular risk factors.     Please refer to Patient Instructions for Blood Glucose Monitoring and Insulin /Medications Dosing Guide"  in media tab for additional information. Please  also refer to " Patient Self Inventory" in the Media  tab for reviewed elements of pertinent patient history.  Sheena Dennis participated in the discussions, expressed understanding, and voiced agreement with the above plans.  All questions were answered to his satisfaction. he is encouraged to contact clinic should he have any questions or concerns prior to his return visit.    Hulon Magic, [facility] [address] [place], [place] [address] Phone: [phone] Fax: [phone]

## 2023-02-20 ENCOUNTER — Ambulatory Visit: Payer: Medicare HMO | Admitting: Cardiology

## 2023-02-20 DIAGNOSIS — E1165 Type 2 diabetes mellitus with hyperglycemia: Secondary | ICD-10-CM | POA: Diagnosis not present

## 2023-03-05 ENCOUNTER — Telehealth: Payer: Self-pay | Admitting: *Deleted

## 2023-03-05 NOTE — Telephone Encounter (Signed)
Patient left a message that her husband had lost his CGM receiver, it was later found in the snow. It is not working She is asking if he could get another one?

## 2023-03-06 NOTE — Telephone Encounter (Signed)
I called and talked with the patient. He is coming by today to pick the receiver up.

## 2023-03-06 NOTE — Telephone Encounter (Signed)
He can come by and we can give him a receiver from the office.

## 2023-03-07 DIAGNOSIS — E1140 Type 2 diabetes mellitus with diabetic neuropathy, unspecified: Secondary | ICD-10-CM | POA: Diagnosis not present

## 2023-03-07 DIAGNOSIS — E1122 Type 2 diabetes mellitus with diabetic chronic kidney disease: Secondary | ICD-10-CM | POA: Diagnosis not present

## 2023-03-07 DIAGNOSIS — G629 Polyneuropathy, unspecified: Secondary | ICD-10-CM | POA: Diagnosis not present

## 2023-03-12 ENCOUNTER — Other Ambulatory Visit: Payer: Self-pay | Admitting: *Deleted

## 2023-03-12 DIAGNOSIS — E1122 Type 2 diabetes mellitus with diabetic chronic kidney disease: Secondary | ICD-10-CM

## 2023-03-12 MED ORDER — DEXCOM G6 SENSOR MISC
3 refills | Status: DC
Start: 2023-03-12 — End: 2023-03-13

## 2023-03-13 ENCOUNTER — Other Ambulatory Visit: Payer: Self-pay

## 2023-03-13 DIAGNOSIS — Z794 Long term (current) use of insulin: Secondary | ICD-10-CM

## 2023-03-13 MED ORDER — DEXCOM G7 SENSOR MISC
3 refills | Status: DC
Start: 2023-03-13 — End: 2023-10-23

## 2023-03-23 DIAGNOSIS — E1165 Type 2 diabetes mellitus with hyperglycemia: Secondary | ICD-10-CM | POA: Diagnosis not present

## 2023-04-01 ENCOUNTER — Ambulatory Visit: Payer: Medicare HMO | Admitting: Physician Assistant

## 2023-04-04 DIAGNOSIS — D6869 Other thrombophilia: Secondary | ICD-10-CM | POA: Diagnosis not present

## 2023-04-04 DIAGNOSIS — I700 Atherosclerosis of aorta: Secondary | ICD-10-CM | POA: Diagnosis not present

## 2023-04-04 DIAGNOSIS — E1122 Type 2 diabetes mellitus with diabetic chronic kidney disease: Secondary | ICD-10-CM | POA: Diagnosis not present

## 2023-04-14 DIAGNOSIS — I5022 Chronic systolic (congestive) heart failure: Secondary | ICD-10-CM | POA: Diagnosis not present

## 2023-04-14 DIAGNOSIS — I4891 Unspecified atrial fibrillation: Secondary | ICD-10-CM | POA: Diagnosis not present

## 2023-04-14 DIAGNOSIS — M1991 Primary osteoarthritis, unspecified site: Secondary | ICD-10-CM | POA: Diagnosis not present

## 2023-04-14 DIAGNOSIS — E1140 Type 2 diabetes mellitus with diabetic neuropathy, unspecified: Secondary | ICD-10-CM | POA: Diagnosis not present

## 2023-04-14 DIAGNOSIS — E6609 Other obesity due to excess calories: Secondary | ICD-10-CM | POA: Diagnosis not present

## 2023-04-14 DIAGNOSIS — Z6832 Body mass index (BMI) 32.0-32.9, adult: Secondary | ICD-10-CM | POA: Diagnosis not present

## 2023-04-14 DIAGNOSIS — E1122 Type 2 diabetes mellitus with diabetic chronic kidney disease: Secondary | ICD-10-CM | POA: Diagnosis not present

## 2023-04-14 DIAGNOSIS — E1129 Type 2 diabetes mellitus with other diabetic kidney complication: Secondary | ICD-10-CM | POA: Diagnosis not present

## 2023-04-14 DIAGNOSIS — N183 Chronic kidney disease, stage 3 unspecified: Secondary | ICD-10-CM | POA: Diagnosis not present

## 2023-04-14 DIAGNOSIS — M5030 Other cervical disc degeneration, unspecified cervical region: Secondary | ICD-10-CM | POA: Diagnosis not present

## 2023-04-14 DIAGNOSIS — I10 Essential (primary) hypertension: Secondary | ICD-10-CM | POA: Diagnosis not present

## 2023-04-14 DIAGNOSIS — G629 Polyneuropathy, unspecified: Secondary | ICD-10-CM | POA: Diagnosis not present

## 2023-04-20 DIAGNOSIS — E1165 Type 2 diabetes mellitus with hyperglycemia: Secondary | ICD-10-CM | POA: Diagnosis not present

## 2023-04-25 ENCOUNTER — Other Ambulatory Visit (HOSPITAL_COMMUNITY): Payer: Self-pay | Admitting: Internal Medicine

## 2023-04-25 DIAGNOSIS — M5030 Other cervical disc degeneration, unspecified cervical region: Secondary | ICD-10-CM

## 2023-05-02 ENCOUNTER — Ambulatory Visit (HOSPITAL_COMMUNITY)
Admission: RE | Admit: 2023-05-02 | Discharge: 2023-05-02 | Disposition: A | Discharge status: Home / Self Care | Source: Ambulatory Visit | Attending: Internal Medicine | Admitting: Internal Medicine

## 2023-05-02 ENCOUNTER — Encounter (HOSPITAL_COMMUNITY): Payer: Self-pay

## 2023-05-02 DIAGNOSIS — M50222 Other cervical disc displacement at C5-C6 level: Secondary | ICD-10-CM | POA: Diagnosis not present

## 2023-05-02 DIAGNOSIS — M47812 Spondylosis without myelopathy or radiculopathy, cervical region: Secondary | ICD-10-CM | POA: Diagnosis not present

## 2023-05-02 DIAGNOSIS — M5030 Other cervical disc degeneration, unspecified cervical region: Secondary | ICD-10-CM | POA: Insufficient documentation

## 2023-05-02 DIAGNOSIS — M5023 Other cervical disc displacement, cervicothoracic region: Secondary | ICD-10-CM | POA: Diagnosis not present

## 2023-05-02 DIAGNOSIS — M4802 Spinal stenosis, cervical region: Secondary | ICD-10-CM | POA: Diagnosis not present

## 2023-05-05 DIAGNOSIS — I700 Atherosclerosis of aorta: Secondary | ICD-10-CM | POA: Diagnosis not present

## 2023-05-05 DIAGNOSIS — E1140 Type 2 diabetes mellitus with diabetic neuropathy, unspecified: Secondary | ICD-10-CM | POA: Diagnosis not present

## 2023-05-05 NOTE — Progress Notes (Deleted)
 Cardiology Office Note:  .   Date:  05/05/2023  ID:  [name], DOB 1950-02-14, MRN [id number] PCP: Elfredia Nevins, MD  [facility] Providers Cardiologist:  Dina Rich, MD { Click to update primary MD,subspecialty MD or APP then REFRESH:1}   History of Present Illness: .   Xavier Caldwell is a 73 y.o. 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   Patient seen in the hospital 05/17/22(admitted with perirectal abscess) for SOB and possible flash pulmonary edema unclear if triggered by Afib or HTN treated with lasix. Echo normal LVEF, moderate LVH.    ROS: ***  Studies Reviewed: Marland Kitchen         Prior CV Studies: {Select studies to display:26339}   Echo 2022-05-13 IMPRESSIONS     1. Left ventricular ejection fraction, by estimation, is 55 to 60%. The  left ventricle has normal function. Left ventricular endocardial border  not optimally defined to evaluate regional wall motion. There is moderate  left ventricular hypertrophy. Left  ventricular diastolic parameters are indeterminate.   2. Right ventricular systolic function is normal. The right ventricular  size is normal. Tricuspid regurgitation signal is inadequate for assessing  PA pressure.   3. Left atrial size was severely dilated.   4. Right atrial size was severely dilated.   5. The mitral valve is normal in structure. No evidence of mitral valve  regurgitation. No evidence of mitral stenosis.   6. The aortic valve is tricuspid. Aortic valve regurgitation is not  visualized. No aortic stenosis is present.   7. The pulmonic valve was abnormal.   8. The inferior vena cava is dilated in size with <50% respiratory  variability, suggesting right atrial pressure of 15 mmHg.    Risk Assessment/Calculations:   {Does this patient have ATRIAL FIBRILLATION?:[phone]} No BP  recorded.  {Refresh Note OR Click here to enter BP  :1}***       Physical Exam:   VS:  There were no vitals taken for this visit.   Wt Readings from Last 3 Encounters:  02/19/23 231 lb 6.4 oz (105 kg)  08/27/22 233 lb (105.7 kg)  08/19/22 229 lb (103.9 kg)    GEN: Well nourished, well developed in no acute distress NECK: No JVD; No carotid bruits CARDIAC: ***RRR, no murmurs, rubs, gallops RESPIRATORY:  Clear to auscultation without rales, wheezing or rhonchi  ABDOMEN: Soft, non-tender, non-distended EXTREMITIES:  No edema; No deformity   ASSESSMENT AND PLAN: .      CAD inf STEMI 04/2020 DES to RCA complicated by VT arrest-atypical sharp chest pain 2023-05-13 eased in 5 seconds before NTG. No changes today   PAF controlled with coreg and eliquis.   HFrEF 55-60% on echo May 13, 2022 -trace of ankle edema. On lasix/hydralazine. 2 gm sodium diet   HTN with LVH-some elevated readings but normal today. Adjust diet and he'll continue to monitor at home   HLD managed by PCP but LDL 47 08/2021   Carotid stenosis. Stable on dopplers 07/2022 reviewed   DM2-needs better control as A1C 8.5 in 2023-05-13 and eating a lot of sweets.          {Are you ordering a CV Procedure (e.g. stress test, cath, DCCV, TEE, etc)?   Press F2        :[id number]}  Dispo: ***  Signed, Elon Jester  Geni Bers, PA-C

## 2023-05-12 ENCOUNTER — Ambulatory Visit: Payer: Medicare HMO | Admitting: Physician Assistant

## 2023-05-20 ENCOUNTER — Ambulatory Visit: Payer: Medicare HMO | Admitting: Nurse Practitioner

## 2023-05-21 DIAGNOSIS — E1165 Type 2 diabetes mellitus with hyperglycemia: Secondary | ICD-10-CM | POA: Diagnosis not present

## 2023-05-28 ENCOUNTER — Encounter: Payer: Self-pay | Admitting: Nurse Practitioner

## 2023-05-28 ENCOUNTER — Ambulatory Visit: Payer: Medicare HMO | Admitting: Nurse Practitioner

## 2023-05-28 VITALS — BP 138/80 | HR 56 | Ht 71.0 in | Wt 231.2 lb

## 2023-05-28 DIAGNOSIS — E041 Nontoxic single thyroid nodule: Secondary | ICD-10-CM

## 2023-05-28 DIAGNOSIS — E782 Mixed hyperlipidemia: Secondary | ICD-10-CM | POA: Diagnosis not present

## 2023-05-28 DIAGNOSIS — E042 Nontoxic multinodular goiter: Secondary | ICD-10-CM | POA: Diagnosis not present

## 2023-05-28 DIAGNOSIS — N182 Chronic kidney disease, stage 2 (mild): Secondary | ICD-10-CM

## 2023-05-28 DIAGNOSIS — E1122 Type 2 diabetes mellitus with diabetic chronic kidney disease: Secondary | ICD-10-CM | POA: Diagnosis not present

## 2023-05-28 DIAGNOSIS — Z794 Long term (current) use of insulin: Secondary | ICD-10-CM | POA: Diagnosis not present

## 2023-05-28 DIAGNOSIS — R7989 Other specified abnormal findings of blood chemistry: Secondary | ICD-10-CM

## 2023-05-28 DIAGNOSIS — I10 Essential (primary) hypertension: Secondary | ICD-10-CM | POA: Diagnosis not present

## 2023-05-28 LAB — POCT GLYCOSYLATED HEMOGLOBIN (HGB A1C): Hemoglobin A1C: 8.5 % — AB (ref 4.0–5.6)

## 2023-05-28 MED ORDER — GLIMEPIRIDE 4 MG PO TABS: 2.0000 mg | ORAL_TABLET | Freq: Every day | ORAL | Status: DC

## 2023-05-28 NOTE — Progress Notes (Signed)
 Endocrinology Follow Up Note 05/28/23    ---------------------------------------------------------------------------------------------------------------------- Subjective    Past Medical History:  Diagnosis Date   Acute systolic (congestive) heart failure (HCC)    Anxiety    Arthritis    Atrial fibrillation (HCC)    Back pain    Chronic kidney disease    pt told by PA of Dr. Monte Antonio that he had "stage 3 kidney so they had me stop taking Metformin "   Coronary artery disease    Diabetes mellitus without complication (HCC)    High cholesterol    History of gout    Hypertension    Myocardial infarction (HCC) 04/2018   Neuropathy    PAD (peripheral artery disease) (HCC)    Retinal detachment    One on the right and two on the left   Sleep apnea    Stop Bang score of 4   STEMI (ST elevation myocardial infarction) (HCC)    04/08/2018 PCI/DES RCA   Stroke [facility])    February 2022   VT (ventricular tachycardia) [facility])     Past Surgical History:  Procedure Laterality Date   APPENDECTOMY     CARDIOVERSION N/A 04/13/2018   Procedure: CARDIOVERSION;  Surgeon: Maudine Sos, MD;  Location: [facility] ENDOSCOPY;  Service: Cardiovascular;  Laterality: N/A;   CATARACT EXTRACTION W/PHACO Right 04/25/2014   Procedure: CATARACT EXTRACTION PHACO AND INTRAOCULAR LENS PLACEMENT (IOC);  Surgeon: Clay Cummins, MD;  Location: [facility] ORS;  Service: Ophthalmology;  Laterality: Right;  CDE:4.70   CATARACT EXTRACTION W/PHACO Left 07/11/2014   Procedure: CATARACT EXTRACTION PHACO AND INTRAOCULAR LENS PLACEMENT LEFT EYE CDE=8.32;  Surgeon: Clay Cummins, MD;  Location: [facility] ORS;  Service: Ophthalmology;  Laterality: Left;   COLONOSCOPY WITH PROPOFOL  N/A 09/23/2016   Rourk: 9 polyps removed, multiple tubular adenomas. recommended 3 yr surveillance   COLONOSCOPY WITH PROPOFOL  N/A 12/27/2019   ten sessile polyps, 4-9 mm in size in descending, hepatic flexure, and cecum, 11 mm polyp in sigmoid, three sessile polyps  in rectum and mid rectum 2-3 mm in size. Tubular adenomas and hyperplastic. 3 year surveillance   CORONARY/GRAFT ACUTE MI REVASCULARIZATION N/A 04/07/2018   Procedure: Coronary/Graft Acute MI Revascularization;  Surgeon: Odie Benne, MD;  Location: [facility] INVASIVE CV LAB;  Service: Cardiovascular;  Laterality: N/A;   ENDARTERECTOMY Right 03/29/2020   Procedure: RIGHT CAROTID ARTERY ENDARTERECTOMY WITH PATCH ANGIOPLASTY;  Surgeon: Young Hensen, MD;  Location: [facility] OR;  Service: Vascular;  Laterality: Right;   ENDARTERECTOMY Left 09/11/2020   Procedure: ENDARTERECTOMY CAROTID LEFT;  Surgeon: Young Hensen, MD;  Location: [facility] OR;  Service: Vascular;  Laterality: Left;   LEFT HEART CATH AND CORONARY ANGIOGRAPHY N/A 04/07/2018   Procedure: LEFT HEART CATH AND CORONARY ANGIOGRAPHY;  Surgeon: Odie Benne, MD;  Location: [facility] INVASIVE CV LAB;  Service: Cardiovascular;  Laterality: N/A;   MELANOMA EXCISION  07/2019   PATCH ANGIOPLASTY Left 09/11/2020   Procedure: PATCH ANGIOPLASTY;  Surgeon: Young Hensen, MD;  Location: [facility] OR;  Service: Vascular;  Laterality: Left;   POLYPECTOMY  09/23/2016   Procedure: POLYPECTOMY;  Surgeon: Suzette Espy, MD;  Location: [facility] ENDO SUITE;  Service: Endoscopy;;  colon   POLYPECTOMY  12/27/2019   Procedure: POLYPECTOMY;  Surgeon: Suzette Espy, MD;  Location: [facility] ENDO SUITE;  Service: Endoscopy;;   RETINAL DETACHMENT SURGERY      Social History   Socioeconomic History   Marital status: Married    Spouse name: Not on file   Number of  children: Not on file   Years of education: Not on file   Highest education level: Not on file  Occupational History   Not on file  Tobacco Use   Smoking status: Former    Current packs/day: 0.00    Average packs/day: 1.5 packs/day for 30.0 years (45.0 ttl pk-yrs)    Types: Cigarettes    Start date: 07/06/1982    Quit date: 07/05/2012    Years since quitting: 10.9    Passive exposure: Never    Smokeless tobacco: Never  Vaping Use   Vaping status: Never Used  Substance and Sexual Activity   Alcohol use: No   Drug use: No   Sexual activity: Not Currently    Birth control/protection: None  Other Topics Concern   Not on file  Social History Narrative   Not on file   Social Drivers of Health   Financial Resource Strain: Low Risk  (06/06/2022)   Overall Financial Resource Strain (CARDIA)    Difficulty of Paying Living Expenses: Not hard at all  Food Insecurity: No Food Insecurity (09/20/2022)   Hunger Vital Sign    Worried About Running Out of Food in the Last Year: Never true    Ran Out of Food in the Last Year: Never true  Transportation Needs: No Transportation Needs (09/20/2022)   PRAPARE - Administrator, Civil Service (Medical): No    Lack of Transportation (Non-Medical): No  Physical Activity: Sufficiently Active (09/20/2022)   Exercise Vital Sign    Days of Exercise per Week: 5 days    Minutes of Exercise per Session: 60 min  Stress: No Stress Concern Present (06/26/2022)   [employer] of Occupational Health - Occupational Stress Questionnaire    Feeling of Stress : Only a little  Social Connections: Socially Integrated (12/07/2021)   Social Connection and Isolation Panel [NHANES]    Frequency of Communication with Friends and Family: More than three times a week    Frequency of Social Gatherings with Friends and Family: More than three times a week    Attends Religious Services: More than 4 times per year    Active Member of [employer] or Organizations: Yes    Attends Engineer, structural: More than 4 times per year    Marital Status: Married  Catering manager Violence: Not on file    Current Outpatient Medications on File Prior to Visit  Medication Sig Dispense Refill   ALPRAZolam  (XANAX ) 0.5 MG tablet Take 0.5 mg by mouth 2 (two) times daily as needed for anxiety.     amLODipine  (NORVASC ) 10 MG tablet Take 1 tablet (10 mg total) by mouth  daily. 30 tablet 1   apixaban  (ELIQUIS ) 5 MG TABS tablet Take 1 tablet (5 mg total) by mouth 2 (two) times daily. Restart medication tomorrow, 09/12/2020 60 tablet 6   atorvastatin  (LIPITOR ) 80 MG tablet TAKE ONE TABLET BY MOUTH EVERYDAY AT BEDTIME 90 tablet 2   carvedilol  (COREG ) 12.5 MG tablet Take 1 tablet (12.5 mg total) by mouth 2 (two) times daily. 180 tablet 3   Continuous Glucose Sensor (DEXCOM G7 SENSOR) MISC Change sensor every 10 days. Dx E11.65 9 each 3   DULoxetine  (CYMBALTA ) 60 MG capsule Take 60 mg by mouth daily with lunch.      furosemide  (LASIX ) 20 MG tablet Take 20-40 mg by mouth daily as needed for fluid.     gabapentin  (NEURONTIN ) 300 MG capsule Take 300 mg by mouth 2 (two) times daily.  glucose blood (ONETOUCH ULTRA) test strip USE ONE TEST STRIP AS DIRECTED TWICE DAILY 100 each 0   hydrALAZINE  (APRESOLINE ) 50 MG tablet TAKE 1 TABLET BY MOUTH 3 TIMES DAILY 90 tablet 10   Insulin  Degludec (TRESIBA  FLEXTOUCH Angie) Inject 40 Units into the skin at bedtime.     metFORMIN  (GLUCOPHAGE ) 500 MG tablet Take 1 tablet (500 mg total) by mouth 2 (two) times daily with a meal. 180 tablet 3   mupirocin  ointment (BACTROBAN ) 2 % Apply 1 application topically daily as needed (melonoma).     nitroGLYCERIN  (NITROSTAT ) 0.4 MG SL tablet Place 1 tablet (0.4 mg total) under the tongue every 5 (five) minutes as needed. (Patient taking differently: Place 0.4 mg under the tongue every 5 (five) minutes as needed for chest pain.) 25 tablet 2   polyethylene glycol (MIRALAX  / GLYCOLAX ) 17 g packet Take 17 g by mouth 2 (two) times daily. 60 each 1   rOPINIRole  (REQUIP ) 1 MG tablet Take 1 mg by mouth 4 (four) times daily.      timolol  (TIMOPTIC ) 0.5 % ophthalmic solution INSTILL 1 DROP INTO EACH EYE TWICE DAILY (Patient taking differently: Place 1 drop into both eyes 2 (two) times daily.) 10 mL 10   UNIFINE PENTIPS 31G X 5 MM MISC USE TO INJECT INSULIN  AT BEDTIME (Patient taking differently: at bedtime.) 100  each 5   ezetimibe  (ZETIA ) 10 MG tablet Take 1 tablet (10 mg total) by mouth daily. 30 tablet 5   No current facility-administered medications on file prior to visit.      HPI  Diabetes He presents for his follow-up diabetic visit. He has type 2 diabetes mellitus. Onset time: diagnosed at approx age of 40. His disease course has been improving. Hypoglycemia symptoms include nervousness/anxiousness, sweats and tremors. Associated symptoms include blurred vision (hx detached retina) and fatigue. Hypoglycemia complications include nocturnal hypoglycemia. Symptoms are stable. Diabetic complications include a CVA and heart disease (previous MI). Risk factors for coronary artery disease include diabetes mellitus, dyslipidemia, family history, obesity, male sex and hypertension. Current diabetic treatment includes insulin  injections and oral agent (dual therapy). He is compliant with treatment most of the time. His weight is fluctuating minimally. He is following a generally healthy diet. When asked about meal planning, he reported none. He has not had a previous visit with a dietitian. He participates in exercise daily. His home blood glucose trend is decreasing steadily. His breakfast blood glucose range is generally 70-90 mg/dl. His bedtime blood glucose range is generally 180-200 mg/dl. (He presents today with his CGM and logs showing improving glycemic profile overall.  His POCT A1c today is 8.5%, improving from last visit of 10.7%.  He does have mild fasting hypoglycemia noted.  He continues to work on diet and exercise.  Analysis of his CGM shows TIR 55%, TAR 43, TBR 3% with a GMI of 7.5%.) An ACE inhibitor/angiotensin II receptor blocker is not being taken. He sees a podiatrist.Eye exam is current.    Xavier Caldwell is a 73 y.o.-year-old male, referred by his PCP, Dr. Lewayne Records, for evaluation for multinodular goiter and now for uncontrolled diabetes management.  His nodule was incidentally found on CT chest  following up on possible lung nodule.  He was then sent for thyroid  ultrasound to assess in depth. And subsequently was sent for FNA of mildly suspicious thyroid  nodule.  Unfortunately, the specimen obtained was not enough to rule out malignancy, thus needed to be repeated.  The repeat biopsy was sufficient  to determine benignity.     I reviewed pt's thyroid  tests: Lab Results  Component Value Date   TSH 0.441 (L) 02/12/2023   TSH 0.601 05/22/2022   TSH 0.434 (L) 03/04/2022   TSH 0.17 (L) 09/21/2020   TSH 0.166 (L) 02/29/2020   TSH 0.668 04/17/2018   TSH 0.547 04/11/2018   TSH 0.314 (L) 01/07/2017   TSH 0.410 (L) 01/01/2016   FREET4 1.12 02/12/2023   FREET4 1.31 05/22/2022   FREET4 0.99 03/04/2022   FREET4 0.82 09/21/2020   FREET4 1.20 02/29/2020   FREET4 1.13 01/07/2017   FREET4 1.05 01/01/2016    No FH of thyroid  ds. No FH of thyroid  cancer. No h/o radiation tx to head or neck.  No seaweed or kelp. No recent contrast studies. No steroid use. No herbal supplements. No Biotin supplements or Hair, Skin and Nails vitamins.  Pt also has a history of DM, HLD, PVD, Afib, CAD  with MI, HTN.  Review of systems  Constitutional: + Minimally fluctuating body weight,  current Body mass index is 32.25 kg/m. , no fatigue, no subjective hyperthermia, no subjective hypothermia Eyes: no blurry vision, no xerophthalmia ENT: no sore throat, no nodules palpated in throat, no dysphagia/odynophagia, no hoarseness Cardiovascular: no chest pain, no shortness of breath, no palpitations, no leg swelling Respiratory: no cough, no shortness of breath Gastrointestinal: no nausea/vomiting/diarrhea Musculoskeletal: no muscle/joint aches Skin: no rashes, no hyperemia Neurological: no tremors, no numbness, no tingling, no dizziness Psychiatric: no depression, no anxiety  ---------------------------------------------------------------------------------------------------------------------- Objective     BP 138/80 (BP Location: Left Arm, Patient Position: Sitting, Cuff Size: Large)   Pulse (!) 56   Ht 5\' 11"  (1.803 m)   Wt 231 lb 3.2 oz (104.9 kg)   BMI 32.25 kg/m    BP Readings from Last 3 Encounters:  05/28/23 138/80  02/19/23 130/76  08/27/22 116/60    Wt Readings from Last 3 Encounters:  05/28/23 231 lb 3.2 oz (104.9 kg)  02/19/23 231 lb 6.4 oz (105 kg)  08/27/22 233 lb (105.7 kg)     Physical Exam- Limited  Constitutional:  Body mass index is 32.25 kg/m. , not in acute distress, normal state of mind Eyes:  EOMI, no exophthalmos Musculoskeletal: no gross deformities, strength intact in all four extremities, no gross restriction of joint movements Skin:  no rashes, no hyperemia Neurological: no tremor with outstretched hands    Thyroid  Ultrasound from 01/21/22 CLINICAL DATA:  Incidental on CT.   EXAM: THYROID  ULTRASOUND   TECHNIQUE: Ultrasound examination of the thyroid  gland and adjacent soft tissues was performed.   COMPARISON:  CT chest January 14, 2022   FINDINGS: Parenchymal Echotexture: Moderately heterogenous   Isthmus: 1.1 cm   Right lobe: 7.8 x 3.1 x 3.0 cm   Left lobe: 6.6 x 3.2 x 2.8 cm   _________________________________________________________   Estimated total number of nodules >/= 1 cm:   Number of spongiform nodules >/=  2 cm not described below (TR1): 0   Number of mixed cystic and solid nodules >/= 1.5 cm not described below (TR2): 0   _________________________________________________________   Nodule labeled 4 appears to be a mixed cystic and solid isoechoic TR 2 nodule in the thyroid  isthmus measuring 1.5 cm. This nodule does NOT meet TI-RADS criteria for biopsy or dedicated follow-up.   Nodule labeled 1 is a mixed cystic and solid isoechoic TR 2 nodule in the superior right thyroid  lobe measuring 1.7 cm. This nodule does NOT meet TI-RADS criteria for  biopsy or dedicated follow-up.   Nodule labeled 2 is a mixed cystic  and solid isoechoic TR 2 nodule in the mid right thyroid  lobe measuring 1.1 cm. This nodule does NOT meet TI-RADS criteria for biopsy or dedicated follow-up.   Nodule labeled 3 appears to be best described as a solid isoechoic TR 3 nodule in the inferior right thyroid  lobe measuring 3.7 x 3.2 x 3.0 cm. **Given size (>/= 2.5 cm) and appearance, fine needle aspiration of this mildly suspicious nodule should be considered based on TI-RADS criteria.   Nodule labeled 5 is a mixed cystic and solid isoechoic TR 2 nodule in the superior left thyroid  lobe measuring 1.5 cm. This nodule does NOT meet TI-RADS criteria for biopsy or dedicated follow-up.   Nodule labeled 6 is a mixed cystic and solid isoechoic TR 2 nodule in the posterior aspect of the mid left thyroid  lobe measuring 1.5 cm. This nodule does NOT meet TI-RADS criteria for biopsy or dedicated follow-up.   IMPRESSION: 1. Enlarged multinodular thyroid  gland. 2. Nodule labeled 3 in the inferior right thyroid  lobe (3.7 cm TR 3) meets criteria for biopsy. 3. A number of additional benign-appearing nodules as described do not meet criteria for further dedicated follow-up or biopsy.   The above is in keeping with the ACR TI-RADS recommendations - J Am Coll Radiol 2017;14:587-595.     Electronically Signed   By: Maryann Smalls M.D.   On: 01/22/2022 14:02  FNA on 03/12/22  CYTOLOGY - NON PAP  CASE: [id number]  PATIENT: Xavier Caldwell  Non-Gynecological Cytology Report      Clinical History: 3.7 cm RLL; TR3  Specimen Submitted:  A. THYROID , RLL, FINE NEEDLE ASPIRATION:    FINAL MICROSCOPIC DIAGNOSIS:  - Scant follicular epithelium present (Bethesda category I)   SPECIMEN ADEQUACY:  Satisfactory but limited for evaluation, scant cellularity   DIAGNOSTIC COMMENTS:  Inflammation present.   GROSS:  Received is/are 6 slides in 95% Ethyl alcohol, and 30 ccs of pale pink  Cytolyt solution. (CM:cm)  Prepared:  Smears: 6   Concentration Method (Thin Prep):  1  Cell Block:  Cell block attempted, not obtained.  Additional Studies:  Also there was an Afirma collected.  ------------------------------------------------------------------------------------------------------------- Repeat FNA 06/19/22 CYTOLOGY - NON PAP CASE: [id number] PATIENT: Xavier Caldwell Non-Gynecological Cytology Report  Clinical History: Thyroid  nodule Specimen Submitted:  A. THYROID , RIGHT LOWER LOBE, FINE NEEDLE ASPIRATION:  FINAL MICROSCOPIC DIAGNOSIS: - Consistent with benign follicular nodule (Bethesda category II)  SPECIMEN ADEQUACY: Satisfactory for evaluation  DIAGNOSTIC COMMENTS: The aspirate shows abundant colloid with scattered follicular groups and macrophages consistent with a benign follicular/colloid nodule (follicular nodular disease).  GROSS: Received is/are 6 slides in 95% Ethyl alcohol, and 30ccs of pale beige Cytolyt solution. (TS:ts) Prepared: Smears:  6 Concentration Method (Thin Prep):  1 Cell Block:  Cell block attempted, not obtained. Additional Studies:  Afirma collected.     Final Diagnosis performed by Judithann Novas, MD.   Electronically signed 06/21/2022 Technical component performed at [facility], [address], [place], [place] [address].  Professional component performed at [employer], [address], [place], [place] [address].  Immunohistochemistry Technical component (if applicable) was performed at [facility] [address], [place], [place] [address].   IMMUNOHISTOCHEMISTRY DISCLAIMER (if applicable): Some of these immunohistochemical stains may have been developed and the performance characteristics determine by [facility]. Some may not have been cleared or approved by the U.S. Food  and Drug Administration. The FDA has determined that such clearance or approval is not necessary. This test is  used for clinical purposes. It should not be regarded as investigational or for research. This laboratory is certified under the Clinical Laboratory Improvement Amendments of 1988 (CLIA-88) as qualified to perform high complexity clinical laboratory testing.  The controls stained appropriately.  ------------------------------------------------------------------------------------------------------------------------- Thyroid  US  from 01/20/23  CLINICAL DATA:  73 year old male with multinodular thyroid    EXAM: THYROID  ULTRASOUND   TECHNIQUE: Ultrasound examination of the thyroid  gland and adjacent soft tissues was performed.   COMPARISON:  01/21/2022, biopsy right inferior thyroid  nodule 03/13/2022   Biopsy right inferior thyroid  nodule 06/19/2022   FINDINGS: Parenchymal Echotexture: Moderately heterogenous   Isthmus: 0.8 cm   Right lobe: 6.2 cm x 2.3 cm x 2.0 cm   Left lobe: 6.7 cm x 2.9 cm x 2.9 cm   _________________________________________________________   Estimated total number of nodules >/= 1 cm: 6-10   Number of spongiform nodules >/=  2 cm not described below (TR1): 0   Number of mixed cystic and solid nodules >/= 1.5 cm not described below (TR2): 0   _________________________________________________________   Nodule labeled 1 in the superior right thyroid , 1.6 cm, previously 1.7 cm. Nodule has clear spongiform characteristics on current and previous and does not meet criteria for surveillance.   Nodule labeled 2 mid right thyroid , 1.4 cm. Nodule has clear spongiform characteristics on the prior and the current and does not meet criteria for surveillance.   Nodule labeled 3 inferior right thyroid , 3.4 cm. Nodule has undergone biopsy on 2 prior occasion, 06/19/2022, 03/13/2022. Assuming benign result no further specific follow-up would be indicated.   Nodule labeled 4 on the current, isthmus, 1.5 cm and unchanged. This nodule has TR 2/cystic  characteristics and does not meet criteria for surveillance.   Nodule labeled 5 superior left thyroid , 2.4 cm. Nodule has a differing measurement to the prior of 1.5 cm, attributable to inclusion of heterogeneous tissue adjacent to the nodule on the current. The cystic nodule appears unchanged to the prior and given the TR 2 characteristics does not meet criteria for surveillance.   Nodule labeled 6, inferior left thyroid , 1.5 cm. This nodule remains TR 2/cystic and does not meet criteria for surveillance.   No adenopathy   IMPRESSION: Similar appearance of multi cystic/nodular thyroid  as above.   Assuming benign biopsy of inferior right thyroid  nodule (labeled 3), no further specific follow-up would be indicated.   Above recommendations follow those established by the new ACR TI-RADS criteria (J Am Coll Radiol 2017;14:587-595).     Electronically Signed   By: Myrlene Asper D.O.   On: 01/20/2023 12:46   Latest Reference Range & Units 04/17/18 08:29 02/29/20 10:22 09/21/20 09:30 03/04/22 09:01 05/22/22 09:25 02/12/23 07:48  TSH 0.450 - 4.500 uIU/mL 0.668 0.166 (L) 0.17 (L) 0.434 (L) 0.601 0.441 (L)  Triiodothyronine,Free,Serum 2.0 - 4.4 pg/mL    3.2 3.1 3.0  T4,Free(Direct) 0.82 - 1.77 ng/dL  2.95 6.21 3.08 6.57 8.46  Thyroxine (T4) 4.5 - 12.0 ug/dL 9.2       Thyroperoxidase Ab SerPl-aCnc 0 - 34 IU/mL    10    Thyroglobulin Antibody 0.0 - 0.9 IU/mL    <1.0    (L): Data is abnormally low  ----------------------------------------------------------------------------------------------------------------------  ASSESSMENT / PLAN:  1. Thyroid  Nodule  -He had FNA on 03/12/22 to assess for malignancy of mildly suspicious TR3 nodule which unfortunately only had scant cellularity present, therefore malignancy  could not be ruled out.  This biopsy was repeated once inflammation had decreased and was determined to be benign.  The rest of the nodules found on previous ultrasound do not meet  criteria for additional follow up.  Repeat US  from 01/20/23 shows stable cystic multinodular thyroid  gland.  No further follow up is needed at this time.  His thyroid  antibody testing was negative, ruling out autoimmune thyroid  dysfunction.    His previous thyroid  function tests show euthyroid presentation.  He will not need any anti-thyroid  treatment or thyroid  hormone replacement at this time.    2. 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  His POCT A1c today is 8.5%, improving from last visit of 10.7%.  He does have mild fasting hypoglycemia noted.  He continues to work on diet and exercise.  Analysis of his CGM shows TIR 55%, TAR 43, TBR 3% with a GMI of 7.5%.  - Patient has currently uncontrolled symptomatic type 2 DM since 73 years of age.   -Recent labs reviewed.  - I had a long discussion with him about the progressive nature of diabetes and the pathology behind its complications. -His diabetes is complicated by CVA and MI in the past and he remains at a high risk for more acute and chronic complications which include CAD, CVA, CKD, retinopathy, and neuropathy. These are all discussed in detail with him.  The following Lifestyle Medicine recommendations according to American College of Lifestyle Medicine [facility]) were discussed and offered to patient and he agrees to start the journey:  A. Whole Foods, Plant-based plate comprising of fruits and vegetables, plant-based proteins, whole-grain carbohydrates was discussed in detail with the patient.   A list for source of those nutrients were also provided to the patient.  Patient will use only water  or unsweetened tea for hydration. B.  The need to stay away from risky substances including alcohol, smoking; obtaining 7 to 9 hours of restorative sleep, at least 150 minutes of moderate intensity exercise weekly, the  importance of healthy social connections,  and stress reduction techniques were discussed. C.  A full color page of  Calorie density of various food groups per pound showing examples of each food groups was provided to the patient.  - Nutritional counseling repeated at each appointment due to patients tendency to fall back in to old habits.  - The patient admits there is a room for improvement in their diet and drink choices. -  Suggestion is made for the patient to avoid simple carbohydrates from their diet including Cakes, Sweet Desserts / Pastries, Ice Cream, Soda (diet and regular), Sweet Tea, Candies, Chips, Cookies, Sweet Pastries, Store Bought Juices, Alcohol in Excess of 1-2 drinks a day, Artificial Sweeteners, Coffee Creamer, and "Sugar-free" Products. This will help patient to have stable blood glucose profile and potentially avoid unintended weight gain.   - I encouraged the patient to switch to unprocessed or minimally processed complex starch and increased protein intake (animal or plant source), fruits, and vegetables.   - Patient is advised to stick to a routine mealtimes to eat 3 meals a day and avoid unnecessary snacks (to snack only to correct hypoglycemia).  - He will be scheduled with Penny Crumpton, RDN, CDE for diabetes education.  - I have approached him with the following individualized plan to manage his diabetes and patient agrees:   -He is  advised to continue his Tresiba  40 units SQ nightly and Metformin  500 mg po twice daily.  I did lower his Glimepiride  to 2 mg po daily at breakfast (may take 1/2 of his 4 mg tablets).  I am hoping this will help prevent hypoglycemia for which he has a tendency to over-correct.    -He is encouraged to continue monitoring glucose 2 times daily (using his CGM), before breakfast and before bed, and to call the clinic if he has readings less than 70 or above 300 for 3 tests in a row.  I asked he reach out in 2 weeks with readings so we can  further adjust medications if needed to avoid hypoglycemia.  - He is warned not to take insulin  without proper monitoring per orders. - Adjustment parameters are given to him for hypo and hyperglycemia in writing.  - He will be considered for incretin therapy as appropriate next visit.  - Specific targets for  A1c; LDL, HDL, and Triglycerides were discussed with the patient.   FOLLOW UP PLAN: Return in about 3 months (around 08/27/2023) for Diabetes F/U with A1c in office, No previsit labs, Bring meter and logs.       I spent  30  minutes in the care of the patient today including review of labs from CMP, Lipids, Thyroid  Function, Hematology (current and previous including abstractions from other facilities); face-to-face time discussing  his blood glucose readings/logs, discussing hypoglycemia and hyperglycemia episodes and symptoms, medications doses, his options of short and long term treatment based on the latest standards of care / guidelines;  discussion about incorporating lifestyle medicine;  and documenting the encounter. Risk reduction counseling performed per USPSTF guidelines to reduce obesity and cardiovascular risk factors.     Please refer to Patient Instructions for Blood Glucose Monitoring and Insulin /Medications Dosing Guide"  in media tab for additional information. Please  also refer to " Patient Self Inventory" in the Media  tab for reviewed elements of pertinent patient history.  Sheena Dennis participated in the discussions, expressed understanding, and voiced agreement with the above plans.  All questions were answered to his satisfaction. he is encouraged to contact clinic should he have any questions or concerns prior to his return visit.    Hulon Magic, [facility] [address] [place], [place] [address] Phone: [phone] Fax: [phone]

## 2023-06-04 DIAGNOSIS — E1140 Type 2 diabetes mellitus with diabetic neuropathy, unspecified: Secondary | ICD-10-CM | POA: Diagnosis not present

## 2023-06-04 DIAGNOSIS — I4891 Unspecified atrial fibrillation: Secondary | ICD-10-CM | POA: Diagnosis not present

## 2023-06-04 DIAGNOSIS — D6869 Other thrombophilia: Secondary | ICD-10-CM | POA: Diagnosis not present

## 2023-06-20 DIAGNOSIS — E1165 Type 2 diabetes mellitus with hyperglycemia: Secondary | ICD-10-CM | POA: Diagnosis not present

## 2023-07-01 DIAGNOSIS — E119 Type 2 diabetes mellitus without complications: Secondary | ICD-10-CM | POA: Diagnosis not present

## 2023-07-02 ENCOUNTER — Encounter: Payer: Self-pay | Admitting: Cardiology

## 2023-07-02 ENCOUNTER — Ambulatory Visit: Attending: Cardiology | Admitting: Cardiology

## 2023-07-02 VITALS — BP 150/80 | HR 75 | Ht 71.0 in | Wt 231.2 lb

## 2023-07-02 DIAGNOSIS — I5033 Acute on chronic diastolic (congestive) heart failure: Secondary | ICD-10-CM | POA: Diagnosis not present

## 2023-07-02 DIAGNOSIS — R079 Chest pain, unspecified: Secondary | ICD-10-CM | POA: Diagnosis not present

## 2023-07-02 DIAGNOSIS — I2510 Atherosclerotic heart disease of native coronary artery without angina pectoris: Secondary | ICD-10-CM | POA: Diagnosis not present

## 2023-07-02 DIAGNOSIS — I10 Essential (primary) hypertension: Secondary | ICD-10-CM

## 2023-07-02 DIAGNOSIS — E782 Mixed hyperlipidemia: Secondary | ICD-10-CM

## 2023-07-02 MED ORDER — FUROSEMIDE 20 MG PO TABS: 40.0000 mg | ORAL_TABLET | Freq: Two times a day (BID) | ORAL | 5 refills | Status: DC

## 2023-07-02 MED ORDER — NITROGLYCERIN 0.4 MG SL SUBL: 0.4000 mg | SUBLINGUAL_TABLET | SUBLINGUAL | 2 refills | Status: AC | PRN

## 2023-07-02 NOTE — Progress Notes (Signed)
 Clinical Summary Mr. Ewen is a 73 y.o.male seen today for follow up of the following medical problem.s    1. CAD - admit 04/2018 with inferior STEMI - received DES to RCA. Complicated VT arrest requiring defib in the cath lab and by bradycardia, required temp pacer.  Due to afib was on triple therapy ASA/plavix /DOAC - 04/2018 echo LVEF 45-50%,    - some chest pains at times - comes on with activity. Comes on with high levels of activity. Left sided, dull pain 2/10 in severity. No other associated symptoms. Lasts few minutes. Occurs once every few weeks. Will take NG and resolves.  - of note 04/2018 with his MI presented with weakness, nausea.  - symptoms started roughly six months     2. Afib - new diagnosis during 04/2018 admission with STEMI - management complicated by bradycardia - was on admiodarone, s/p DCCV. Amiodarone  was discontinued at outpatient f/u - has had recurernt afib since that time, was not isolated to his STEMI   - no bleeding on eliquis    3. HTN - home bp's 130s/70s -has not taken meds yet today.    4. Hyperlipemia 09/2020 TC 111 TG 77 HDL 30 LDL 66 - he is on atorva 80 and zetia  10mg  - Jan 2025 TC 113 TG 172 HDL 38 LDL 46     5. Carotid stenosis - followed by vascular Dr Fulton Job - has had bilateral CEAs - followed by vascular Dr Fulton Job, upcoming appt Sept 2025     6. Hyperkalemia/AKI - admit 03/2020 with hyperkalemia, K up to 8, Cr was 2.1. Presented with CVA like symptoms. Occurred when lisinopril  was increased.  - lisinopril  was stopped     7.Dizziness - no specific trigger - starts getting dizzy, feeling lightheaded. Starts getting dry heaves, weak all over.  - no true syncope.    8. Chronic HFpEF - takes lasix  20mg  bid - recent increased LE edema     Works [employer] Past Medical History:  Diagnosis Date   Acute systolic (congestive) heart failure (HCC)    Anxiety    Arthritis    Atrial fibrillation (HCC)    Back pain     Chronic kidney disease    pt told by PA of Dr. Monte Antonio that he had "stage 3 kidney so they had me stop taking Metformin "   Coronary artery disease    Diabetes mellitus without complication (HCC)    High cholesterol    History of gout    Hypertension    Myocardial infarction (HCC) 04/2018   Neuropathy    PAD (peripheral artery disease) (HCC)    Retinal detachment    One on the right and two on the left   Sleep apnea    Stop Bang score of 4   STEMI (ST elevation myocardial infarction) (HCC)    04/08/2018 PCI/DES RCA   Stroke [facility])    February 2022   VT (ventricular tachycardia) (HCC)      No Known Allergies   Current Outpatient Medications  Medication Sig Dispense Refill   ALPRAZolam  (XANAX ) 0.5 MG tablet Take 0.5 mg by mouth 2 (two) times daily as needed for anxiety.     amLODipine  (NORVASC ) 10 MG tablet Take 1 tablet (10 mg total) by mouth daily. 30 tablet 1   apixaban  (ELIQUIS ) 5 MG TABS tablet Take 1 tablet (5 mg total) by mouth 2 (two) times daily. Restart medication tomorrow, 09/12/2020 60 tablet 6   atorvastatin  (LIPITOR )  80 MG tablet TAKE ONE TABLET BY MOUTH EVERYDAY AT BEDTIME 90 tablet 2   carvedilol  (COREG ) 12.5 MG tablet Take 1 tablet (12.5 mg total) by mouth 2 (two) times daily. 180 tablet 3   Continuous Glucose Sensor (DEXCOM G7 SENSOR) MISC Change sensor every 10 days. Dx E11.65 9 each 3   DULoxetine  (CYMBALTA ) 60 MG capsule Take 60 mg by mouth daily with lunch.      ezetimibe  (ZETIA ) 10 MG tablet Take 1 tablet (10 mg total) by mouth daily. 30 tablet 5   furosemide  (LASIX ) 20 MG tablet Take 40 mg by mouth 2 (two) times daily.     gabapentin  (NEURONTIN ) 300 MG capsule Take 300 mg by mouth 2 (two) times daily.      glimepiride  (AMARYL ) 4 MG tablet Take 0.5 tablets (2 mg total) by mouth daily.     glucose blood (ONETOUCH ULTRA) test strip USE ONE TEST STRIP AS DIRECTED TWICE DAILY 100 each 0   hydrALAZINE  (APRESOLINE ) 50 MG tablet TAKE 1 TABLET BY MOUTH 3 TIMES DAILY 90  tablet 10   Insulin  Degludec (TRESIBA  FLEXTOUCH [place]) Inject 40 Units into the skin at bedtime.     metFORMIN  (GLUCOPHAGE ) 500 MG tablet Take 1 tablet (500 mg total) by mouth 2 (two) times daily with a meal. 180 tablet 3   mupirocin  ointment (BACTROBAN ) 2 % Apply 1 application topically daily as needed (melonoma).     nitroGLYCERIN  (NITROSTAT ) 0.4 MG SL tablet Place 1 tablet (0.4 mg total) under the tongue every 5 (five) minutes as needed. (Patient taking differently: Place 0.4 mg under the tongue every 5 (five) minutes as needed for chest pain.) 25 tablet 2   polyethylene glycol (MIRALAX  / GLYCOLAX ) 17 g packet Take 17 g by mouth 2 (two) times daily. 60 each 1   rOPINIRole  (REQUIP ) 1 MG tablet Take 1 mg by mouth 4 (four) times daily.      timolol  (TIMOPTIC ) 0.5 % ophthalmic solution INSTILL 1 DROP INTO EACH EYE TWICE DAILY (Patient taking differently: Place 1 drop into both eyes 2 (two) times daily.) 10 mL 10   UNIFINE PENTIPS 31G X 5 MM MISC USE TO INJECT INSULIN  AT BEDTIME (Patient taking differently: at bedtime.) 100 each 5   No current facility-administered medications for this visit.     Past Surgical History:  Procedure Laterality Date   APPENDECTOMY     CARDIOVERSION N/A 04/13/2018   Procedure: CARDIOVERSION;  Surgeon: Maudine Sos, MD;  Location: [facility] ENDOSCOPY;  Service: Cardiovascular;  Laterality: N/A;   CATARACT EXTRACTION W/PHACO Right 04/25/2014   Procedure: CATARACT EXTRACTION PHACO AND INTRAOCULAR LENS PLACEMENT (IOC);  Surgeon: Clay Cummins, MD;  Location: [facility] ORS;  Service: Ophthalmology;  Laterality: Right;  CDE:4.70   CATARACT EXTRACTION W/PHACO Left 07/11/2014   Procedure: CATARACT EXTRACTION PHACO AND INTRAOCULAR LENS PLACEMENT LEFT EYE CDE=8.32;  Surgeon: Clay Cummins, MD;  Location: [facility] ORS;  Service: Ophthalmology;  Laterality: Left;   COLONOSCOPY WITH PROPOFOL  N/A 09/23/2016   Rourk: 9 polyps removed, multiple tubular adenomas. recommended 3 yr surveillance    COLONOSCOPY WITH PROPOFOL  N/A 12/27/2019   ten sessile polyps, 4-9 mm in size in descending, hepatic flexure, and cecum, 11 mm polyp in sigmoid, three sessile polyps in rectum and mid rectum 2-3 mm in size. Tubular adenomas and hyperplastic. 3 year surveillance   CORONARY/GRAFT ACUTE MI REVASCULARIZATION N/A 04/07/2018   Procedure: Coronary/Graft Acute MI Revascularization;  Surgeon: Odie Benne, MD;  Location: [facility] INVASIVE CV LAB;  Service: Cardiovascular;  Laterality: N/A;   ENDARTERECTOMY Right 03/29/2020   Procedure: RIGHT CAROTID ARTERY ENDARTERECTOMY WITH PATCH ANGIOPLASTY;  Surgeon: Young Hensen, MD;  Location[facility] OR;  Service: Vascular;  Laterality: Right;   ENDARTERECTOMY Left 09/11/2020   Procedure: ENDARTERECTOMY CAROTID LEFT;  Surgeon: Young Hensen, MD;  Location: [facility] OR;  Service: Vascular;  Laterality: Left;   LEFT HEART CATH AND CORONARY ANGIOGRAPHY N/A 04/07/2018   Procedure: LEFT HEART CATH AND CORONARY ANGIOGRAPHY;  Surgeon: Odie Benne, MD;  Location: [facility] INVASIVE CV LAB;  Service: Cardiovascular;  Laterality: N/A;   MELANOMA EXCISION  07/2019   PATCH ANGIOPLASTY Left 09/11/2020   Procedure: PATCH ANGIOPLASTY;  Surgeon: Young Hensen, MD;  Location: [facility] OR;  Service: Vascular;  Laterality: Left;   POLYPECTOMY  09/23/2016   Procedure: POLYPECTOMY;  Surgeon: Suzette Espy, MD;  Location: [facility] ENDO SUITE;  Service: Endoscopy;;  colon   POLYPECTOMY  12/27/2019   Procedure: POLYPECTOMY;  Surgeon: Suzette Espy, MD;  Location: [facility] ENDO SUITE;  Service: Endoscopy;;   RETINAL DETACHMENT SURGERY       No Known Allergies    Family History  Problem Relation Age of Onset   Hypertension Father    Colon cancer Neg Hx    Diabetes Neg Hx      Social History Mr. Andres reports that he quit smoking about 10 years ago. His smoking use included cigarettes. He started smoking about 41 years ago. He has a 45 pack-year smoking history. He has  never been exposed to tobacco smoke. He has never used smokeless tobacco. Mr. Normoyle reports no history of alcohol use.   Physical Examination Today's Vitals   07/02/23 1010 07/02/23 1056  BP: (!) 158/80 (!) 150/80  Pulse: 75   SpO2: 97%   Weight: 231 lb 3.2 oz (104.9 kg)   Height: 5\' 11"  (1.803 m)    Body mass index is 32.25 kg/m.  Gen: resting comfortably, no acute distress HEENT: no scleral icterus, pupils equal round and reactive, no palptable cervical adenopathy,  CV: irreg, no mrg, no jvd Resp: mild crackles bilaterally GI: abdomen is soft, non-tender, non-distended, normal bowel sounds, no hepatosplenomegaly MSK: 2+ bilateral LE edema.  Skin: warm, no rash Neuro:  no focal deficits Psych: appropriate affect   Diagnostic Studies   Cath: 04/07/2018   Prox RCA lesion is 100% stenosed. Ost LAD to Prox LAD lesion is 30% stenosed. Mid LAD lesion is 40% stenosed. Ost 2nd Mrg to 2nd Mrg lesion is 40% stenosed. A drug-eluting stent was successfully placed using a STENT RESOLUTE ONYX 3.5X30. Post intervention, there is a 0% residual stenosis.   1. 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 Ventricular fibrillation/cardiac arrest with successful resuscitation 3. Successful PTCA/DES x 1 proximal RCA.  4. Successful aspiration thrombectomy distal RCA 5. Mild to moderate non-obstructive disease in the Circumflex and LAD   Recommendations: Will admit to ICU. Echo in the am. Will continue Aggrastat  for 18 hours. Will continue ASA/Brilinta  and statin. Will not start a beta blocker today given hypotension and bradycardia. Ace inh on hold with hypotension.    TTE: 04/08/2018   IMPRESSIONS      1. The left ventricle has mildly reduced systolic function, with an ejection fraction of 45-50%. The cavity size was normal. There is moderately increased left ventricular wall thickness. Left ventricular diastolic Doppler parameters are indeterminate  There  is abnormal septal motion consistent with RV pacemaker. Left ventricular  diffuse hypokinesis.  2. The right ventricle has mildly reduced systolic function. The cavity was mildly enlarged. There is no increase in right ventricular wall thickness.  3. The mitral valve is degenerative. There is mild mitral annular calcification present.  4. The aortic valve was not well visualized.  5. The aortic root and ascending aorta are normal in size and structure.  6. The inferior vena cava was dilated in size with <50% respiratory variability.  7. Severe hypokinesis of the left ventricular basal to mid inferior and inferoseptal.  8. The interatrial septum was not well visualized.  Assessment and Plan  CAD - recent chest pains, will obtain exercise nuclear stress test to further assess    2. PAF/acquired thrombophilia - no recent symptoms, continue current meds   3. HTN - elevated but has not taken meds yet, home bp's at goal - continue current meds  4. Hyperlipidemia - LDL at goal, continue current meds  5. Acute on chronic HFpEF - significant LE edema on exam today, increase lasix  to 40mg  bid. -check bmet/mg in 2 weeks.       Laurann Pollock, M.D.

## 2023-07-02 NOTE — Patient Instructions (Signed)
 Medication Instructions:  Your physician has recommended you make the following change in your medication:  Increase Lasix  to 40 mg twice daily  Continue taking all other medications as prescribed   Labwork: BMET in 2 weeks at [facility] in [place]   Testing/Procedures: Your physician has requested that you have en exercise stress myoview. For further information please visit [email or web address] Please follow instruction sheet, as given.   Follow-Up: Your physician recommends that you schedule a follow-up appointment in: 4-6 weeks with Clementine Cutting   Any Other Special Instructions Will Be Listed Below (If Applicable). Thank you for choosing [facility]!     If you need a refill on your cardiac medications before your next appointment, please call your pharmacy.

## 2023-07-04 DIAGNOSIS — Z6831 Body mass index (BMI) 31.0-31.9, adult: Secondary | ICD-10-CM | POA: Diagnosis not present

## 2023-07-04 DIAGNOSIS — M4722 Other spondylosis with radiculopathy, cervical region: Secondary | ICD-10-CM | POA: Diagnosis not present

## 2023-07-05 DIAGNOSIS — E1140 Type 2 diabetes mellitus with diabetic neuropathy, unspecified: Secondary | ICD-10-CM | POA: Diagnosis not present

## 2023-07-05 DIAGNOSIS — I700 Atherosclerosis of aorta: Secondary | ICD-10-CM | POA: Diagnosis not present

## 2023-07-10 ENCOUNTER — Encounter (HOSPITAL_COMMUNITY)

## 2023-07-10 ENCOUNTER — Ambulatory Visit (HOSPITAL_COMMUNITY)

## 2023-07-15 DIAGNOSIS — I5032 Chronic diastolic (congestive) heart failure: Secondary | ICD-10-CM | POA: Diagnosis not present

## 2023-07-16 LAB — BASIC METABOLIC PANEL WITH GFR
BUN/Creatinine Ratio: 19 (ref 10–24)
BUN: 17 mg/dL (ref 8–27)
CO2: 22 mmol/L (ref 20–29)
Calcium: 9 mg/dL (ref 8.6–10.2)
Chloride: 106 mmol/L (ref 96–106)
Creatinine, Ser: 0.9 mg/dL (ref 0.76–1.27)
Glucose: 128 mg/dL — ABNORMAL HIGH (ref 70–99)
Potassium: 4.7 mmol/L (ref 3.5–5.2)
Sodium: 139 mmol/L (ref 134–144)
eGFR: 91 mL/min/{1.73_m2} (ref >59)

## 2023-07-17 ENCOUNTER — Ambulatory Visit (HOSPITAL_BASED_OUTPATIENT_CLINIC_OR_DEPARTMENT_OTHER)
Admission: RE | Admit: 2023-07-17 | Discharge: 2023-07-17 | Disposition: A | Discharge status: Home / Self Care | Source: Ambulatory Visit | Attending: Cardiology | Admitting: Cardiology

## 2023-07-17 ENCOUNTER — Ambulatory Visit (HOSPITAL_COMMUNITY)
Admission: RE | Admit: 2023-07-17 | Discharge: 2023-07-17 | Disposition: A | Discharge status: Home / Self Care | Source: Ambulatory Visit | Attending: Cardiology | Admitting: Cardiology

## 2023-07-17 DIAGNOSIS — R079 Chest pain, unspecified: Secondary | ICD-10-CM | POA: Insufficient documentation

## 2023-07-17 LAB — NM MYOCAR MULTI W/SPECT W/WALL MOTION / EF
Estimated workload: 1
Exercise duration (min): 0 min
Exercise duration (sec): 0 s
LV dias vol: 125 mL (ref 62–150)
LV sys vol: 51 mL (ref 4.2–5.8)
MPHR: 149 {beats}/min
Nuc Stress EF: 59 %
Peak HR: 75 {beats}/min
Percent HR: 50 %
RATE: 0.5
Rest HR: 64 {beats}/min
Rest Nuclear Isotope Dose: 10.1 mCi
SDS: 0
SRS: 4
SSS: 4
ST Depression (mm): 0 mm
Stress Nuclear Isotope Dose: 31.9 mCi
TID: 1.06

## 2023-07-17 MED ORDER — REGADENOSON 0.4 MG/5ML IV SOLN
INTRAVENOUS | Status: AC
  Administered 2023-07-17: 0.4 mg via INTRAVENOUS
  Filled 2023-07-17: qty 5

## 2023-07-17 MED ORDER — TECHNETIUM TC 99M TETROFOSMIN IV KIT
10.1000 | PACK | Freq: Once | INTRAVENOUS | Status: AC | PRN
  Administered 2023-07-17: 10.1 via INTRAVENOUS

## 2023-07-17 MED ORDER — SODIUM CHLORIDE FLUSH 0.9 % IV SOLN
INTRAVENOUS | Status: AC
  Administered 2023-07-17: 10 mL via INTRAVENOUS
  Filled 2023-07-17: qty 10

## 2023-07-17 MED ORDER — TECHNETIUM TC 99M TETROFOSMIN IV KIT
31.9000 | PACK | Freq: Once | INTRAVENOUS | Status: AC | PRN
  Administered 2023-07-17: 31.9 via INTRAVENOUS

## 2023-07-21 DIAGNOSIS — E1165 Type 2 diabetes mellitus with hyperglycemia: Secondary | ICD-10-CM | POA: Diagnosis not present

## 2023-07-29 ENCOUNTER — Ambulatory Visit: Payer: Self-pay | Admitting: Cardiology

## 2023-07-29 DIAGNOSIS — R519 Headache, unspecified: Secondary | ICD-10-CM | POA: Diagnosis not present

## 2023-07-29 DIAGNOSIS — M542 Cervicalgia: Secondary | ICD-10-CM | POA: Diagnosis not present

## 2023-08-01 ENCOUNTER — Encounter: Payer: Self-pay | Admitting: *Deleted

## 2023-08-04 ENCOUNTER — Ambulatory Visit (HOSPITAL_COMMUNITY): Attending: Surgery

## 2023-08-04 ENCOUNTER — Other Ambulatory Visit: Payer: Self-pay

## 2023-08-04 ENCOUNTER — Encounter (HOSPITAL_COMMUNITY): Payer: Self-pay

## 2023-08-04 DIAGNOSIS — M4722 Other spondylosis with radiculopathy, cervical region: Secondary | ICD-10-CM | POA: Insufficient documentation

## 2023-08-04 DIAGNOSIS — M542 Cervicalgia: Secondary | ICD-10-CM | POA: Insufficient documentation

## 2023-08-04 NOTE — Therapy (Signed)
 OUTPATIENT PHYSICAL THERAPY CERVICAL EVALUATION   Patient Name: Xavier Caldwell MRN: [id number] DOB:[birth year], 73 y.o., male Today's Date: 08/04/2023  END OF SESSION:  PT End of Session - 08/04/23 0932     Visit Number 1    Number of Visits 9    Date for PT Re-Evaluation 09/01/23    Authorization Type [employer]    Authorization Time Period no auth    Progress Note Due on Visit 10    PT Start Time 0933    PT Stop Time 1010    PT Time Calculation (min) 37 min    Behavior During Therapy WFL for tasks assessed/performed          Past Medical History:  Diagnosis Date   Acute systolic (congestive) heart failure (HCC)    Anxiety    Arthritis    Atrial fibrillation (HCC)    Back pain    Chronic kidney disease    pt told by PA of Dr. Lenis that he had stage 3 kidney so they had me stop taking Metformin    Coronary artery disease    Diabetes mellitus without complication (HCC)    High cholesterol    History of gout    Hypertension    Myocardial infarction (HCC) 04/2018   Neuropathy    PAD (peripheral artery disease) (HCC)    Retinal detachment    One on the right and two on the left   Sleep apnea    Stop Bang score of 4   STEMI (ST elevation myocardial infarction) (HCC)    04/08/2018 PCI/DES RCA   Stroke [facility])    February 2022   VT (ventricular tachycardia) [facility])    Past Surgical History:  Procedure Laterality Date   APPENDECTOMY     CARDIOVERSION N/A 04/13/2018   Procedure: CARDIOVERSION;  Surgeon: Raford Riggs, MD;  Location: [facility] ENDOSCOPY;  Service: Cardiovascular;  Laterality: N/A;   CATARACT EXTRACTION W/PHACO Right 04/25/2014   Procedure: CATARACT EXTRACTION PHACO AND INTRAOCULAR LENS PLACEMENT (IOC);  Surgeon: Dow JULIANNA Burke, MD;  Location: [facility] ORS;  Service: Ophthalmology;  Laterality: Right;  CDE:4.70   CATARACT EXTRACTION W/PHACO Left 07/11/2014   Procedure: CATARACT EXTRACTION PHACO AND INTRAOCULAR LENS PLACEMENT LEFT EYE CDE=8.32;  Surgeon: Dow JULIANNA Burke, MD;  Location: [facility] ORS;  Service: Ophthalmology;  Laterality: Left;   COLONOSCOPY WITH PROPOFOL  N/A 09/23/2016   Rourk: 9 polyps removed, multiple tubular adenomas. recommended 3 yr surveillance   COLONOSCOPY WITH PROPOFOL  N/A 12/27/2019   ten sessile polyps, 4-9 mm in size in descending, hepatic flexure, and cecum, 11 mm polyp in sigmoid, three sessile polyps in rectum and mid rectum 2-3 mm in size. Tubular adenomas and hyperplastic. 3 year surveillance   CORONARY/GRAFT ACUTE MI REVASCULARIZATION N/A 04/07/2018   Procedure: Coronary/Graft Acute MI Revascularization;  Surgeon: Verlin Lonni BIRCH, MD;  Location: [facility] INVASIVE CV LAB;  Service: Cardiovascular;  Laterality: N/A;   ENDARTERECTOMY Right 03/29/2020   Procedure: RIGHT CAROTID ARTERY ENDARTERECTOMY WITH PATCH ANGIOPLASTY;  Surgeon: Gretta Lonni PARAS, MD;  Location: [facility] OR;  Service: Vascular;  Laterality: Right;   ENDARTERECTOMY Left 09/11/2020   Procedure: ENDARTERECTOMY CAROTID LEFT;  Surgeon: Gretta Lonni PARAS, MD;  Location: [facility] OR;  Service: Vascular;  Laterality: Left;   LEFT HEART CATH AND CORONARY ANGIOGRAPHY N/A 04/07/2018   Procedure: LEFT HEART CATH AND CORONARY ANGIOGRAPHY;  Surgeon: Verlin Lonni BIRCH, MD;  Location: [facility] INVASIVE CV LAB;  Service: Cardiovascular;  Laterality: N/A;   MELANOMA EXCISION  07/2019  PATCH ANGIOPLASTY Left 09/11/2020   Procedure: PATCH ANGIOPLASTY;  Surgeon: Gretta Lonni PARAS, MD;  Location: [facility] OR;  Service: Vascular;  Laterality: Left;   POLYPECTOMY  09/23/2016   Procedure: POLYPECTOMY;  Surgeon: Shaaron Lamar HERO, MD;  Location: [facility] ENDO SUITE;  Service: Endoscopy;;  colon   POLYPECTOMY  12/27/2019   Procedure: POLYPECTOMY;  Surgeon: Shaaron Lamar HERO, MD;  Location: [facility] ENDO SUITE;  Service: Endoscopy;;   RETINAL DETACHMENT SURGERY     Patient Active Problem List   Diagnosis Date Noted   Perirectal abscess 05/16/2022   COPD (chronic obstructive pulmonary disease) (HCC)  05/16/2022   Former smoker 05/16/2022   History of retinal detachment 01/03/2021   Hyperthyroidism 09/21/2020   Left carotid artery stenosis 09/11/2020   Hyperkalemia 03/09/2020   History of colonic polyps 10/05/2019   Chronic anticoagulation 10/05/2019   Diabetes (HCC) 06/14/2019   Left posterior capsular opacification 06/14/2019   Right posterior capsular opacification 06/14/2019   Primary open angle glaucoma of left eye, mild stage 06/14/2019   PVD (peripheral vascular disease) (HCC) 06/08/2019   Carotid stenosis 04/06/2019   Mixed hyperlipidemia 04/24/2018   Status post coronary artery stent placement    Paroxysmal atrial fibrillation (HCC)    Hematoma of groin 04/09/2018   Symptomatic bradycardia - - ischemic, s/p TPM 04/08/2018   Coronary artery disease involving native coronary artery of native heart with unstable angina pectoris (HCC) 04/08/2018   Presence of drug coated stent in right coronary artery 04/08/2018   Ventricular tachycardia (HCC) 04/08/2018   Acute ST elevation myocardial infarction (STEMI) of inferior wall (HCC)    Cervicalgia 04/16/2017   Acute pain of left shoulder 04/16/2017   Hemorrhoids 11/18/2016   Taking multiple medications for chronic disease 09/18/2016   Constipation 09/18/2016   Hyperlipidemia associated with type 2 diabetes mellitus (HCC) 12/19/2014   Essential hypertension, benign 12/19/2014    PCP: Bertell Satterfield, MD  REFERRING PROVIDER: Tomlinson, Sara Caylin, PA-C  REFERRING DIAG: [phone] (ICD-10-CM) - Other spondylosis with radiculopathy, cervical region   THERAPY DIAG:  Cervical pain (neck)  Cervical spondylosis with radiculopathy  Rationale for Evaluation and Treatment: Rehabilitation  ONSET DATE: so long ago, I can't even remember Months  SUBJECTIVE:                                                                                                                                                                                                          SUBJECTIVE STATEMENT: Pt started having neck pain and PCP gave him muscle relaxers. Pt reporting got up one morning  and it started hurting. Pt being setup with pain injections. Pt can't sleep on R side. Pt reports pain with transitioning supine to sit at neck. Intermittent episodes on random onsets of vertigo and dry heaving. Last episode was 30 days. Pt reports feeling light headed with headaches then onset of vertigo and dry heaving. Hand dominance: Right  PERTINENT HISTORY:    PAIN:  Are you having pain? Yes: NPRS scale: 0/10 @ rest   7-8/10 with R cervical rotation Pain location: Neck Pain description: resistance Aggravating factors: movement, transitions from positions,  Relieving factors: avoiding movement patterns.   PRECAUTIONS: None  RED FLAGS: Cervical red flags: Dysphagia No, Dysmetria No, Diplopia No, Nystagmus No, and Nausea No and Bowel or bladder incontinence: No     WEIGHT BEARING RESTRICTIONS: No  FALLS:  Has patient fallen in last 6 months? Yes. Number of falls 3-4   PATIENT GOALS: figure this out   OBJECTIVE:  Note: Objective measures were completed at Evaluation unless otherwise noted.  DIAGNOSTIC FINDINGS:    PATIENT SURVEYS:  NDI: 21 / 50 = 42.0 %  COGNITION: Overall cognitive status: Within functional limits for tasks assessed  SENSATION: WFL  POSTURE: rounded shoulders, forward head, increased thoracic kyphosis, and flexed trunk  R scapula elevated compared to left side  PALPATION: Tender to touch R UT CPAs C3-C5, hypomobile restricted passively to R rotation in supine.  Improved with repeated motions to R cervical rotation and lateral flexion.   CERVICAL ROM:   Active ROM A/PROM (deg) eval  Flexion 65  Extension 10  Right lateral flexion 12  Left lateral flexion 15  Right rotation 20  Left rotation 53   (Blank rows = not tested)  UPPER EXTREMITY ROM:  Active ROM Right eval Left eval  Shoulder  flexion 130 130  Shoulder extension    Shoulder abduction    Shoulder adduction    Shoulder extension    Shoulder internal rotation    Shoulder external rotation    Elbow flexion    Elbow extension    Wrist flexion    Wrist extension    Wrist ulnar deviation    Wrist radial deviation    Wrist pronation    Wrist supination     (Blank rows = not tested)  UPPER EXTREMITY MMT:  MMT Right eval Left eval  Shoulder flexion 3- 3-  Shoulder extension    Shoulder abduction    Shoulder adduction    Shoulder extension    Shoulder internal rotation    Shoulder external rotation 3+ 3+  Middle trapezius 3+ 3+  Lower trapezius 3- 3-  Elbow flexion    Elbow extension    Wrist flexion    Wrist extension    Wrist ulnar deviation    Wrist radial deviation    Wrist pronation    Wrist supination    Grip strength     (Blank rows = not tested)  CERVICAL SPECIAL TESTS:  Neck flexor muscle endurance test: Positive, Spurling's test: Negative, and Distraction test: Negative  FUNCTIONAL TESTS:    TREATMENT DATE:  Evaluation 08/04/23 BP: 153/88 HR 70 Smooth pursuits: normal Saccades: normal Accomodation: normal  UT stretch x 30 bilaterally Cervical rotation with towel x 10 to R side Seated cervical extension with towel x 10 -5x10'' scapular retraction  PATIENT EDUCATION:  Education details: PT Evaluation, findings, prognosis, frequency, attendance policy, and HEP. Person educated: Patient Education method: Medical illustrator Education comprehension: verbalized understanding  HOME EXERCISE PROGRAM: Access Code: [email or web address] URL: [email or web address] Date: 08/04/2023 Prepared by: Omega Bottcher  Exercises - Seated Cervical Sidebending Stretch  - 1 x daily - 7 x weekly - 3 sets - 1-2 reps - 30 hold - Seated Assisted Cervical  Rotation with Towel  - 1 x daily - 7 x weekly - 3 sets - 10-15 reps - Cervical Extension AROM with Strap  - 1 x daily - 7 x weekly - 3 sets - 10 reps - Seated Scapular Retraction  - 1 x daily - 7 x weekly - 3 sets - 10 reps - 10 hold  ASSESSMENT:  CLINICAL IMPRESSION: Patient is a 73y.o. male who was seen today for physical therapy evaluation and treatment for M47.22 (ICD-10-CM) - Other spondylosis with radiculopathy, cervical region.  Demonstrating significant limitations in cervical range of motion with provide muscle weakness, significant postural dysfunction limiting patient's ADLs, upper body functional activities, and driving.  Patient demonstrating significant movement patterns limiting right cervical rotation and lateral flexion.  Patient with significant closing pattern restriction on R side.  CPAs in today's evaluation with passive cervical range of motion demonstrates significant pain reduction and improved mobility to R side after multiple repeated motions.  Active range of motion improved to 45 degrees on R side of cervical rotation... Pt will benefit from skilled Physical Therapy services to address deficits/limitations in order to improve functional and QOL.    OBJECTIVE IMPAIRMENTS: decreased activity tolerance, decreased balance, difficulty walking, decreased ROM, hypomobility, postural dysfunction, and pain.   ACTIVITY LIMITATIONS: carrying, lifting, sitting, sleeping, transfers, and driving  PARTICIPATION LIMITATIONS: community activity, occupation, and yard work  PERSONAL FACTORS: Age are also affecting patient's functional outcome.   REHAB POTENTIAL: Excellent  CLINICAL DECISION MAKING: Stable/uncomplicated  EVALUATION COMPLEXITY: Low   GOALS: Goals reviewed with patient? No  SHORT TERM GOALS: Target date: 08/18/23  Pt will be independent with HEP in order to demonstrate participation in Physical Therapy POC.  Baseline: Goal status: INITIAL  2.  Pt will report  5/10 pain scale during UB ADLs and R cervical rotation in order to reduce pain and to optimize function.  Baseline:  Goal status: INITIAL  LONG TERM GOALS: Target date: 09/01/23  Pt will improve Deep Neck Flexor Endurance test by 10 seconds in order to demonstrate improved functional strength to return to desired activities.  Baseline: see objective.  Goal status: INITIAL  2.  Pt will improve Cervical ROM by 10 degrees in order to improve functional mobility during ADLs.  Baseline: see objective.  Goal status: INITIAL  3.  Pt will improve NDI score by 20% in order to demonstrate improved pain with functional goals and outcomes. Baseline: see objective.  Goal status: INITIAL  4.  Pt will report 2/10 pain scale during UB ADLs and R cervical in order to reduce pain and to optimize function lasting greater than > 30 minutes.  Baseline: see objective.  Goal status: INITIAL  5.  Pt will improve UB MMT by at least 1/2 grade to improve postural endurance and neck posture. Baseline: see objective.  Goal status: INITIAL   PLAN:  PT FREQUENCY: 2x/week  PT DURATION: 4 weeks  PLANNED INTERVENTIONS: 97164- PT Re-evaluation, 97750- Physical Performance Testing, 97110-Therapeutic exercises, 97530- Therapeutic activity, [id number]- Neuromuscular re-education, 97535- Self Care, [id number]- Manual therapy, [id number]- Electrical stimulation (unattended), [id number]- Electrical  stimulation (manual), 79439 (1-2 muscles), [id number] (3+ muscles)- Dry Needling, Joint mobilization, Joint manipulation, Spinal manipulation, and Spinal mobilization  PLAN FOR NEXT SESSION: cervical range of motion, manual therapy with cervical facet mobility activities, repeated manul motions.   Omega JONETTA Bottcher PT, DPT [facility] [place] [address] [phone] office   Omega JONETTA Bottcher, PT 08/04/2023, 10:12 AM

## 2023-08-13 ENCOUNTER — Encounter: Payer: Self-pay | Admitting: Cardiology

## 2023-08-13 ENCOUNTER — Ambulatory Visit: Attending: Cardiology | Admitting: Cardiology

## 2023-08-13 VITALS — BP 134/80 | HR 74 | Ht 71.0 in | Wt 230.0 lb

## 2023-08-13 DIAGNOSIS — I2510 Atherosclerotic heart disease of native coronary artery without angina pectoris: Secondary | ICD-10-CM

## 2023-08-13 DIAGNOSIS — I4891 Unspecified atrial fibrillation: Secondary | ICD-10-CM

## 2023-08-13 DIAGNOSIS — I5033 Acute on chronic diastolic (congestive) heart failure: Secondary | ICD-10-CM | POA: Diagnosis not present

## 2023-08-13 DIAGNOSIS — Z79899 Other long term (current) drug therapy: Secondary | ICD-10-CM

## 2023-08-13 DIAGNOSIS — I10 Essential (primary) hypertension: Secondary | ICD-10-CM | POA: Diagnosis not present

## 2023-08-13 MED ORDER — FUROSEMIDE 20 MG PO TABS: ORAL_TABLET | ORAL | 6 refills | Status: DC

## 2023-08-13 NOTE — Progress Notes (Signed)
 Clinical Summary [name] is a 73 y.o.male seen today for follow up of the following medical problem. This is a focused visit on recent issues with chest pain and LE edema.     1. CAD - admit 04/2018 with inferior STEMI - received DES to RCA. Complicated VT arrest requiring defib in the cath lab and by bradycardia, required temp pacer.  Due to afib was on triple therapy ASA/plavix /DOAC - 04/2018 echo LVEF 45-50%,    - some chest pains at times - comes on with activity. Comes on with high levels of activity. Left sided, dull pain 2/10 in severity. No other associated symptoms. Lasts few minutes. Occurs once every few weeks. Will take NG and resolves.  - of note 04/2018 with his MI presented with weakness, nausea.  - symptoms started roughly six months    07/2023 nuclear stress: no ischemia - pain has resolved since last visit   2. Chronic HFpEF - takes lasix  20mg  bid - recent increased LE edema    -last visit we increased lasix  to 40mg  bid - Cr and K stable after change - home scale 224 lbs, down about 9 lbs on his scalre.      Other medical problems not addressed this visit   2. Afib - new diagnosis during 04/2018 admission with STEMI - management complicated by bradycardia - was on admiodarone, s/p DCCV. Amiodarone  was discontinued at outpatient f/u - has had recurernt afib since that time, was not isolated to his STEMI   - no bleeding on eliquis    3. HTN - home bp's 130s/70s -has not taken meds yet today.    4. Hyperlipemia 09/2020 TC 111 TG 77 HDL 30 LDL 66 - he is on atorva 80 and zetia  10mg  - Jan 2025 TC 113 TG 172 HDL 38 LDL 46     5. Carotid stenosis - followed by vascular Dr Gretta - has had bilateral CEAs - followed by vascular Dr Gretta, upcoming appt Sept 2025     6. Hyperkalemia/AKI - admit 03/2020 with hyperkalemia, K up to 8, Cr was 2.1. Presented with CVA like symptoms. Occurred when lisinopril  was increased.  - lisinopril  was stopped      7.Dizziness - no specific trigger - starts getting dizzy, feeling lightheaded. Starts getting dry heaves, weak all over.  - no true syncope.    8. 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 Lenis that he had stage 3 kidney so they had me stop taking Metformin    Coronary artery disease    Diabetes mellitus without complication (HCC)    High cholesterol    History of gout    Hypertension    Myocardial infarction (HCC) 04/2018   Neuropathy    PAD (peripheral artery disease) (HCC)    Retinal detachment    One on the right and two on the left   Sleep apnea    Stop Bang score of 4   STEMI (ST elevation myocardial infarction) (HCC)    04/08/2018 PCI/DES RCA   Stroke [facility])    February 2022  VT (ventricular tachycardia) (HCC)      No Known Allergies   Current Outpatient Medications  Medication Sig Dispense Refill   ALPRAZolam  (XANAX ) 0.5 MG tablet Take 0.5 mg by mouth 2 (two) times daily as needed for anxiety.     amLODipine  (NORVASC ) 10 MG tablet Take 1 tablet (10 mg total) by mouth daily. 30 tablet 1   apixaban  (ELIQUIS ) 5 MG TABS tablet Take 1 tablet (5 mg total) by mouth 2 (two) times daily. Restart medication tomorrow, 09/12/2020 60 tablet 6   atorvastatin  (LIPITOR ) 80 MG tablet TAKE ONE TABLET BY MOUTH EVERYDAY AT BEDTIME 90 tablet 2   carvedilol  (COREG ) 12.5 MG tablet Take 1 tablet (12.5 mg total) by mouth 2 (two) times daily. 180 tablet 3   Continuous Glucose Sensor (DEXCOM G7 SENSOR) MISC Change sensor every 10 days. Dx E11.65 9 each 3   DULoxetine  (CYMBALTA ) 60 MG capsule Take 60 mg by mouth daily with lunch.      ezetimibe  (ZETIA ) 10 MG tablet Take 1 tablet (10  mg total) by mouth daily. 30 tablet 5   furosemide  (LASIX ) 20 MG tablet Take 2 tablets (40 mg total) by mouth 2 (two) times daily. 60 tablet 5   gabapentin  (NEURONTIN ) 300 MG capsule Take 300 mg by mouth 2 (two) times daily.      glimepiride  (AMARYL ) 4 MG tablet Take 0.5 tablets (2 mg total) by mouth daily.     glucose blood (ONETOUCH ULTRA) test strip USE ONE TEST STRIP AS DIRECTED TWICE DAILY 100 each 0   hydrALAZINE  (APRESOLINE ) 50 MG tablet TAKE 1 TABLET BY MOUTH 3 TIMES DAILY 90 tablet 10   Insulin  Degludec (TRESIBA  FLEXTOUCH ) Inject 40 Units into the skin at bedtime.     metFORMIN  (GLUCOPHAGE ) 500 MG tablet Take 1 tablet (500 mg total) by mouth 2 (two) times daily with a meal. 180 tablet 3   mupirocin  ointment (BACTROBAN ) 2 % Apply 1 application topically daily as needed (melonoma).     nitroGLYCERIN  (NITROSTAT ) 0.4 MG SL tablet Place 1 tablet (0.4 mg total) under the tongue every 5 (five) minutes as needed (if no relief after 3rd dose proceed to ER or call 911). 25 tablet 2   polyethylene glycol (MIRALAX  / GLYCOLAX ) 17 g packet Take 17 g by mouth 2 (two) times daily. 60 each 1   rOPINIRole  (REQUIP ) 1 MG tablet Take 1 mg by mouth 4 (four) times daily.      timolol  (TIMOPTIC ) 0.5 % ophthalmic solution INSTILL 1 DROP INTO EACH EYE TWICE DAILY (Patient taking differently: Place 1 drop into both eyes 2 (two) times daily.) 10 mL 10   UNIFINE PENTIPS 31G X 5 MM MISC USE TO INJECT INSULIN  AT BEDTIME (Patient taking differently: at bedtime.) 100 each 5   No current facility-administered medications for this visit.     Past Surgical History:  Procedure Laterality Date   APPENDECTOMY     CARDIOVERSION N/A 04/13/2018   Procedure: CARDIOVERSION;  Surgeon: Raford Riggs, MD;  Location: [facility] ENDOSCOPY;  Service: Cardiovascular;  Laterality: N/A;   CATARACT EXTRACTION W/PHACO Right 04/25/2014   Procedure: CATARACT EXTRACTION PHACO AND INTRAOCULAR LENS PLACEMENT (IOC);  Surgeon: Dow JULIANNA Burke,  MD;  Location: [facility] ORS;  Service: Ophthalmology;  Laterality: Right;  CDE:4.70   CATARACT EXTRACTION W/PHACO Left 07/11/2014   Procedure: CATARACT EXTRACTION PHACO AND INTRAOCULAR LENS PLACEMENT LEFT EYE CDE=8.32;  Surgeon: Dow JULIANNA Burke, MD;  Location: [facility] ORS;  Service: Ophthalmology;  Laterality: Left;  COLONOSCOPY WITH PROPOFOL  N/A 09/23/2016   Rourk: 9 polyps removed, multiple tubular adenomas. recommended 3 yr surveillance   COLONOSCOPY WITH PROPOFOL  N/A 12/27/2019   ten sessile polyps, 4-9 mm in size in descending, hepatic flexure, and cecum, 11 mm polyp in sigmoid, three sessile polyps in rectum and mid rectum 2-3 mm in size. Tubular adenomas and hyperplastic. 3 year surveillance   CORONARY/GRAFT ACUTE MI REVASCULARIZATION N/A 04/07/2018   Procedure: Coronary/Graft Acute MI Revascularization;  Surgeon: Verlin Lonni BIRCH, MD;  Location: [facility] INVASIVE CV LAB;  Service: Cardiovascular;  Laterality: N/A;   ENDARTERECTOMY Right 03/29/2020   Procedure: RIGHT CAROTID ARTERY ENDARTERECTOMY WITH PATCH ANGIOPLASTY;  Surgeon: Gretta Lonni PARAS, MD;  Location: [facility] OR;  Service: Vascular;  Laterality: Right;   ENDARTERECTOMY Left 09/11/2020   Procedure: ENDARTERECTOMY CAROTID LEFT;  Surgeon: Gretta Lonni PARAS, MD;  Location: [facility] OR;  Service: Vascular;  Laterality: Left;   LEFT HEART CATH AND CORONARY ANGIOGRAPHY N/A 04/07/2018   Procedure: LEFT HEART CATH AND CORONARY ANGIOGRAPHY;  Surgeon: Verlin Lonni BIRCH, MD;  Location: [facility] INVASIVE CV LAB;  Service: Cardiovascular;  Laterality: N/A;   MELANOMA EXCISION  07/2019   PATCH ANGIOPLASTY Left 09/11/2020   Procedure: PATCH ANGIOPLASTY;  Surgeon: Gretta Lonni PARAS, MD;  Location[facility] OR;  Service: Vascular;  Laterality: Left;   POLYPECTOMY  09/23/2016   Procedure: POLYPECTOMY;  Surgeon: Shaaron Lamar HERO, MD;  Location: [facility] ENDO SUITE;  Service: Endoscopy;;  colon   POLYPECTOMY  12/27/2019   Procedure: POLYPECTOMY;  Surgeon: Shaaron Lamar HERO, MD;  Location: [facility] ENDO SUITE;  Service: Endoscopy;;   RETINAL DETACHMENT SURGERY       No Known Allergies    Family History  Problem Relation Age of Onset   Hypertension Father    Colon cancer Neg Hx    Diabetes Neg Hx      Social History Mr. Mcinturff reports that he quit smoking about 11 years ago. His smoking use included cigarettes. He started smoking about 41 years ago. He has a 45 pack-year smoking history. He has never been exposed to tobacco smoke. He has never used smokeless tobacco. Mr. Hortman reports no history of alcohol use.     Physical Examination Vitals:   08/13/23 0930 08/13/23 0947  BP: (!) 142/68 134/80  Pulse: 74   SpO2: 97%    Filed Weights   08/13/23 0930  Weight: 230 lb (104.3 kg)    Gen: resting comfortably, no acute distress HEENT: no scleral icterus, pupils equal round and reactive, no palptable cervical adenopathy,  CV: RRR, no m/rg, no jvd Resp: Clear to auscultation bilaterally GI: abdomen is soft, non-tender, non-distended, normal bowel sounds, no hepatosplenomegaly MSK: extremities are warm, no edema.  Skin: warm, no rash Neuro:  no focal deficits Psych: appropriate affect   Diagnostic Studies  Cath: 04/07/2018   Prox RCA lesion is 100% stenosed. Ost LAD to Prox LAD lesion is 30% stenosed. Mid LAD lesion is 40% stenosed. Ost 2nd Mrg to 2nd Mrg lesion is 40% stenosed. A drug-eluting stent was successfully placed using a STENT RESOLUTE ONYX 3.5X30. Post intervention, there is a 0% residual stenosis.   1. 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 Ventricular fibrillation/cardiac arrest with successful resuscitation 3. Successful PTCA/DES x 1 proximal RCA.  4. Successful aspiration thrombectomy distal RCA 5. Mild to moderate non-obstructive disease in the Circumflex and LAD   Recommendations: Will admit to ICU. Echo in the am. Will continue Aggrastat  for  18 hours. Will continue ASA/Brilinta  and  statin. Will not start a beta blocker today given hypotension and bradycardia. Ace inh on hold with hypotension.    TTE: 04/08/2018   IMPRESSIONS      1. The left ventricle has mildly reduced systolic function, with an ejection fraction of 45-50%. The cavity size was normal. There is moderately increased left ventricular wall thickness. 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 Left ventricular diffuse hypokinesis.  2. The right ventricle has mildly reduced systolic function. The cavity was mildly enlarged. There is no increase in right ventricular wall thickness.  3. The mitral valve is degenerative. There is mild mitral annular calcification present.  4. The aortic valve was not well visualized.  5. The aortic root and ascending aorta are normal in size and structure.  6. The inferior vena cava was dilated in size with <50% respiratory variability.  7. Severe hypokinesis of the left ventricular basal to mid inferior and inferoseptal.  8. The interatrial septum was not well visualized.  07/2023 nuclear stress  The study is normal. There are no perfusion defects consistent with prior infarct or current ischemia.  The study is low risk.   No ST deviation was noted.   LV perfusion is normal.   Left ventricular function is normal. Nuclear stress EF: 59%. The left ventricular ejection fraction is normal (55-65%). End diastolic cavity size is normal.   There is a small mild intensity apical defect present in the resting images only with normal wall motion consistent with apical thinning.  Assessment and Plan  CAD - recent stress test was benign, prior chest pains have resolved - continue current meds   2. Acute on chronic HFpEF - some ongoing edema, increase lasix  to 60mg  in AM in and 40mg  in PM - check bmet/mg in 2 weeks   F/u 6 weeks.       Dorn PHEBE Ross, M.D.

## 2023-08-13 NOTE — Patient Instructions (Signed)
 Medication Instructions:   Increase Lasix  to 60mg  every morning & 40mg  every evening  Continue all other medications.     Labwork:  BMET, Mg - orders given today Please do in 2 weeks Office will contact with results via phone, letter or mychart.     Testing/Procedures:  none  Follow-Up:  6 weeks  Any Other Special Instructions Will Be Listed Below (If Applicable).   If you need a refill on your cardiac medications before your next appointment, please call your pharmacy.

## 2023-08-20 DIAGNOSIS — E1165 Type 2 diabetes mellitus with hyperglycemia: Secondary | ICD-10-CM | POA: Diagnosis not present

## 2023-08-25 DIAGNOSIS — I4891 Unspecified atrial fibrillation: Secondary | ICD-10-CM | POA: Diagnosis not present

## 2023-08-25 DIAGNOSIS — I5033 Acute on chronic diastolic (congestive) heart failure: Secondary | ICD-10-CM | POA: Diagnosis not present

## 2023-08-25 DIAGNOSIS — I10 Essential (primary) hypertension: Secondary | ICD-10-CM | POA: Diagnosis not present

## 2023-08-25 DIAGNOSIS — Z79899 Other long term (current) drug therapy: Secondary | ICD-10-CM | POA: Diagnosis not present

## 2023-08-26 LAB — BASIC METABOLIC PANEL WITH GFR
BUN/Creatinine Ratio: 17 (ref 10–24)
BUN: 17 mg/dL (ref 8–27)
CO2: 21 mmol/L (ref 20–29)
Calcium: 8.6 mg/dL (ref 8.6–10.2)
Chloride: 100 mmol/L (ref 96–106)
Creatinine, Ser: 1.03 mg/dL (ref 0.76–1.27)
Glucose: 190 mg/dL — ABNORMAL HIGH (ref 70–99)
Potassium: 3.8 mmol/L (ref 3.5–5.2)
Sodium: 136 mmol/L (ref 134–144)
eGFR: 78 mL/min/1.73 (ref >59)

## 2023-08-26 LAB — MAGNESIUM: Magnesium: 1.9 mg/dL (ref 1.6–2.3)

## 2023-08-27 DIAGNOSIS — Z961 Presence of intraocular lens: Secondary | ICD-10-CM | POA: Diagnosis not present

## 2023-08-27 DIAGNOSIS — H40003 Preglaucoma, unspecified, bilateral: Secondary | ICD-10-CM | POA: Diagnosis not present

## 2023-09-01 ENCOUNTER — Ambulatory Visit: Admitting: Nurse Practitioner

## 2023-09-02 ENCOUNTER — Ambulatory Visit (HOSPITAL_COMMUNITY): Attending: Surgery

## 2023-09-02 DIAGNOSIS — M542 Cervicalgia: Secondary | ICD-10-CM | POA: Diagnosis not present

## 2023-09-02 DIAGNOSIS — M4722 Other spondylosis with radiculopathy, cervical region: Secondary | ICD-10-CM | POA: Diagnosis not present

## 2023-09-02 NOTE — Therapy (Signed)
 OUTPATIENT PHYSICAL THERAPY CERVICAL EVALUATION   Patient Name: Xavier Caldwell MRN: [id number] DOB:04-22-1950, 73 y.o., male Today's Date: 09/02/2023  PHYSICAL THERAPY DISCHARGE SUMMARY  Visits from Start of Care: 2  Current functional level related to goals / functional outcomes: See below   Remaining deficits: See below   Education / Equipment: HEP   Patient agrees to discharge. Patient goals were met. Patient is being discharged due to meeting the stated rehab goals.   END OF SESSION:  PT End of Session - 09/02/23 0814     Visit Number 2    Number of Visits 9    Date for PT Re-Evaluation 09/01/23    Authorization Type [employer]    Authorization Time Period no auth    Progress Note Due on Visit 10    PT Start Time 0812    PT Stop Time 0845    PT Time Calculation (min) 33 min    Activity Tolerance Patient tolerated treatment well    Behavior During Therapy WFL for tasks assessed/performed          Past Medical History:  Diagnosis Date   Acute systolic (congestive) heart failure (HCC)    Anxiety    Arthritis    Atrial fibrillation (HCC)    Back pain    Chronic kidney disease    pt told by PA of Dr. Lenis that he had stage 3 kidney so they had me stop taking Metformin    Coronary artery disease    Diabetes mellitus without complication (HCC)    High cholesterol    History of gout    Hypertension    Myocardial infarction (HCC) 04/2018   Neuropathy    PAD (peripheral artery disease) (HCC)    Retinal detachment    One on the right and two on the left   Sleep apnea    Stop Bang score of 4   STEMI (ST elevation myocardial infarction) (HCC)    04/08/2018 PCI/DES RCA   Stroke [facility])    February 2022   VT (ventricular tachycardia) [facility])    Past Surgical History:  Procedure Laterality Date   APPENDECTOMY     CARDIOVERSION N/A 04/13/2018   Procedure: CARDIOVERSION;  Surgeon: Raford Riggs, MD;  Location: [facility] ENDOSCOPY;  Service: Cardiovascular;   Laterality: N/A;   CATARACT EXTRACTION W/PHACO Right 04/25/2014   Procedure: CATARACT EXTRACTION PHACO AND INTRAOCULAR LENS PLACEMENT (IOC);  Surgeon: Dow JULIANNA Burke, MD;  Location: [facility] ORS;  Service: Ophthalmology;  Laterality: Right;  CDE:4.70   CATARACT EXTRACTION W/PHACO Left 07/11/2014   Procedure: CATARACT EXTRACTION PHACO AND INTRAOCULAR LENS PLACEMENT LEFT EYE CDE=8.32;  Surgeon: Dow JULIANNA Burke, MD;  Location: [facility] ORS;  Service: Ophthalmology;  Laterality: Left;   COLONOSCOPY WITH PROPOFOL  N/A 09/23/2016   Rourk: 9 polyps removed, multiple tubular adenomas. recommended 3 yr surveillance   COLONOSCOPY WITH PROPOFOL  N/A 12/27/2019   ten sessile polyps, 4-9 mm in size in descending, hepatic flexure, and cecum, 11 mm polyp in sigmoid, three sessile polyps in rectum and mid rectum 2-3 mm in size. Tubular adenomas and hyperplastic. 3 year surveillance   CORONARY/GRAFT ACUTE MI REVASCULARIZATION N/A 04/07/2018   Procedure: Coronary/Graft Acute MI Revascularization;  Surgeon: Verlin Lonni BIRCH, MD;  Location: [facility] INVASIVE CV LAB;  Service: Cardiovascular;  Laterality: N/A;   ENDARTERECTOMY Right 03/29/2020   Procedure: RIGHT CAROTID ARTERY ENDARTERECTOMY WITH PATCH ANGIOPLASTY;  Surgeon: Gretta Lonni PARAS, MD;  Location[facility] OR;  Service: Vascular;  Laterality: Right;   ENDARTERECTOMY Left  09/11/2020   Procedure: ENDARTERECTOMY CAROTID LEFT;  Surgeon: Gretta Lonni PARAS, MD;  Location: [facility] OR;  Service: Vascular;  Laterality: Left;   LEFT HEART CATH AND CORONARY ANGIOGRAPHY N/A 04/07/2018   Procedure: LEFT HEART CATH AND CORONARY ANGIOGRAPHY;  Surgeon: Verlin Lonni BIRCH, MD;  Location: [facility] INVASIVE CV LAB;  Service: Cardiovascular;  Laterality: N/A;   MELANOMA EXCISION  07/2019   PATCH ANGIOPLASTY Left 09/11/2020   Procedure: PATCH ANGIOPLASTY;  Surgeon: Gretta Lonni PARAS, MD;  Location: [facility] OR;  Service: Vascular;  Laterality: Left;   POLYPECTOMY  09/23/2016   Procedure:  POLYPECTOMY;  Surgeon: Shaaron Lamar HERO, MD;  Location: [facility] ENDO SUITE;  Service: Endoscopy;;  colon   POLYPECTOMY  12/27/2019   Procedure: POLYPECTOMY;  Surgeon: Shaaron Lamar HERO, MD;  Location: [facility] ENDO SUITE;  Service: Endoscopy;;   RETINAL DETACHMENT SURGERY     Patient Active Problem List   Diagnosis Date Noted   Perirectal abscess 05/16/2022   COPD (chronic obstructive pulmonary disease) (HCC) 05/16/2022   Former smoker 05/16/2022   History of retinal detachment 01/03/2021   Hyperthyroidism 09/21/2020   Left carotid artery stenosis 09/11/2020   Hyperkalemia 03/09/2020   History of colonic polyps 10/05/2019   Chronic anticoagulation 10/05/2019   Diabetes (HCC) 06/14/2019   Left posterior capsular opacification 06/14/2019   Right posterior capsular opacification 06/14/2019   Primary open angle glaucoma of left eye, mild stage 06/14/2019   PVD (peripheral vascular disease) (HCC) 06/08/2019   Carotid stenosis 04/06/2019   Mixed hyperlipidemia 04/24/2018   Status post coronary artery stent placement    Paroxysmal atrial fibrillation (HCC)    Hematoma of groin 04/09/2018   Symptomatic bradycardia - - ischemic, s/p TPM 04/08/2018   Coronary artery disease involving native coronary artery of native heart with unstable angina pectoris (HCC) 04/08/2018   Presence of drug coated stent in right coronary artery 04/08/2018   Ventricular tachycardia (HCC) 04/08/2018   Acute ST elevation myocardial infarction (STEMI) of inferior wall (HCC)    Cervicalgia 04/16/2017   Acute pain of left shoulder 04/16/2017   Hemorrhoids 11/18/2016   Taking multiple medications for chronic disease 09/18/2016   Constipation 09/18/2016   Hyperlipidemia associated with type 2 diabetes mellitus (HCC) 12/19/2014   Essential hypertension, benign 12/19/2014    PCP: Bertell Satterfield, MD  REFERRING PROVIDER: Tomlinson, Sara Caylin, PA-C  REFERRING DIAG: [phone] (ICD-10-CM) - Other spondylosis with radiculopathy,  cervical region   THERAPY DIAG:  Cervical pain (neck)  Cervical spondylosis with radiculopathy  Rationale for Evaluation and Treatment: Rehabilitation  ONSET DATE: so long ago, I can't even remember Months  SUBJECTIVE:  SUBJECTIVE STATEMENT: About 75% better overall; not sure I need to come anymore  Eval: Pt started having neck pain and PCP gave him muscle relaxers. Pt reporting got up one morning and it started hurting. Pt being setup with pain injections. Pt can't sleep on R side. Pt reports pain with transitioning supine to sit at neck. Intermittent episodes on random onsets of vertigo and dry heaving. Last episode was 30 days. Pt reports feeling light headed with headaches then onset of vertigo and dry heaving. Hand dominance: Right  PERTINENT HISTORY:    PAIN:  Are you having pain? Yes: NPRS scale: 0/10 @ rest   7-8/10 with R cervical rotation Pain location: Neck Pain description: resistance Aggravating factors: movement, transitions from positions,  Relieving factors: avoiding movement patterns.   PRECAUTIONS: None  RED FLAGS: Cervical red flags: Dysphagia No, Dysmetria No, Diplopia No, Nystagmus No, and Nausea No and Bowel or bladder incontinence: No     WEIGHT BEARING RESTRICTIONS: No  FALLS:  Has patient fallen in last 6 months? Yes. Number of falls 3-4   PATIENT GOALS: figure this out   OBJECTIVE:  Note: Objective measures were completed at Evaluation unless otherwise noted.  DIAGNOSTIC FINDINGS:    PATIENT SURVEYS:  NDI: 21 / 50 = 42.0 %  COGNITION: Overall cognitive status: Within functional limits for tasks assessed  SENSATION: WFL  POSTURE: rounded shoulders, forward head, increased thoracic kyphosis, and flexed trunk  R scapula elevated  compared to left side  PALPATION: Tender to touch R UT CPAs C3-C5, hypomobile restricted passively to R rotation in supine.  Improved with repeated motions to R cervical rotation and lateral flexion.   CERVICAL ROM:   Active ROM AROM (deg) eval AROM  09/02/23  Flexion 65 full  Extension 10 48  Right lateral flexion 12 16  Left lateral flexion 15 20  Right rotation 20 46  Left rotation 53 54   (Blank rows = not tested)  UPPER EXTREMITY ROM:  Active ROM Right eval Left eval Right 09/02/23 Left 09/02/23  Shoulder flexion 130 130    Shoulder extension      Shoulder abduction      Shoulder adduction      Shoulder extension      Shoulder internal rotation      Shoulder external rotation      Elbow flexion      Elbow extension      Wrist flexion      Wrist extension      Wrist ulnar deviation      Wrist radial deviation      Wrist pronation      Wrist supination       (Blank rows = not tested)  UPPER EXTREMITY MMT:  MMT Right eval Left eval Right 09/02/23 Left 09/02/23  Shoulder flexion 3- 3- 5 5  Shoulder extension      Shoulder abduction      Shoulder adduction      Shoulder extension      Shoulder internal rotation      Shoulder external rotation 3+ 3+ 5 5  Middle trapezius 3+ 3+    Lower trapezius 3- 3-    Elbow flexion      Elbow extension      Wrist flexion      Wrist extension      Wrist ulnar deviation      Wrist radial deviation      Wrist pronation      Wrist supination  Grip strength       (Blank rows = not tested)  CERVICAL SPECIAL TESTS:  Neck flexor muscle endurance test: Positive, Spurling's test: Negative, and Distraction test: Negative  FUNCTIONAL TESTS:    TREATMENT DATE:  09/02/23 Progress note NDI 1/50 Cervical AROM see above MMT's see above   Evaluation 08/04/23 BP: 153/88 HR 70 Smooth pursuits: normal Saccades: normal Accomodation: normal  UT stretch x 30 bilaterally Cervical rotation with towel x 10 to R  side Seated cervical extension with towel x 10 -5x10'' scapular retraction                                                                                                                                  PATIENT EDUCATION:  Education details: PT Evaluation, findings, prognosis, frequency, attendance policy, and HEP. Person educated: Patient Education method: Medical illustrator Education comprehension: verbalized understanding  HOME EXERCISE PROGRAM: Access Code: [email or web address] URL: [email or web address] Date: 08/04/2023 Prepared by: Omega Bottcher  Exercises - Seated Cervical Sidebending Stretch  - 1 x daily - 7 x weekly - 3 sets - 1-2 reps - 30 hold - Seated Assisted Cervical Rotation with Towel  - 1 x daily - 7 x weekly - 3 sets - 10-15 reps - Cervical Extension AROM with Strap  - 1 x daily - 7 x weekly - 3 sets - 10 reps - Seated Scapular Retraction  - 1 x daily - 7 x weekly - 3 sets - 10 reps - 10 hold  ASSESSMENT:  CLINICAL IMPRESSION: Patient arrives today feeling quite a bit better; reports compliance with HEP.  Progress note to check status.  Patient has met all set rehab goals and is agreeable to discharge at this time.   Eval:  is a 73y.o. male who was seen today for physical therapy evaluation and treatment for M47.22 (ICD-10-CM) - Other spondylosis with radiculopathy, cervical region.  Demonstrating significant limitations in cervical range of motion with provide muscle weakness, significant postural dysfunction limiting patient's ADLs, upper body functional activities, and driving.  Patient demonstrating significant movement patterns limiting right cervical rotation and lateral flexion.  Patient with significant closing pattern restriction on R side.  CPAs in today's evaluation with passive cervical range of motion demonstrates significant pain reduction and improved mobility to R side after multiple repeated motions.  Active range of motion improved to  45 degrees on R side of cervical rotation... Pt will benefit from skilled Physical Therapy services to address deficits/limitations in order to improve functional and QOL.    OBJECTIVE IMPAIRMENTS: decreased activity tolerance, decreased balance, difficulty walking, decreased ROM, hypomobility, postural dysfunction, and pain.   ACTIVITY LIMITATIONS: carrying, lifting, sitting, sleeping, transfers, and driving  PARTICIPATION LIMITATIONS: community activity, occupation, and yard work  PERSONAL FACTORS: Age are also affecting patient's functional outcome.   REHAB POTENTIAL: Excellent  CLINICAL DECISION MAKING: Stable/uncomplicated  EVALUATION COMPLEXITY: Low   GOALS: Goals reviewed with patient? No  SHORT  TERM GOALS: Target date: 08/18/23  Pt will be independent with HEP in order to demonstrate participation in Physical Therapy POC.  Baseline: Goal status: MET  2.  Pt will report 5/10 pain scale during UB ADLs and R cervical rotation in order to reduce pain and to optimize function.  Baseline:  Goal status: MET  LONG TERM GOALS: Target date: 09/01/23  Pt will improve Deep Neck Flexor Endurance test by 10 seconds in order to demonstrate improved functional strength to return to desired activities.  Baseline: see objective.  Goal status: INITIAL  2.  Pt will improve Cervical ROM by 10 degrees in order to improve functional mobility during ADLs.  Baseline: see objective.  Goal status: MET  3.  Pt will improve NDI score by 20% in order to demonstrate improved pain with functional goals and outcomes. Baseline: see objective.  Goal status: MET  4.  Pt will report 2/10 pain scale during UB ADLs and R cervical in order to reduce pain and to optimize function lasting greater than > 30 minutes.  Baseline: see objective.  Goal status: MET  5.  Pt will improve UB MMT by at least 1/2 grade to improve postural endurance and neck posture. Baseline: see objective.  Goal status:  MET   PLAN:  PT FREQUENCY: 2x/week  PT DURATION: 4 weeks  PLANNED INTERVENTIONS: 97164- PT Re-evaluation, 97750- Physical Performance Testing, 97110-Therapeutic exercises, 97530- Therapeutic activity, 97112- Neuromuscular re-education, 97535- Self Care, 02859- Manual therapy, [id number]- Electrical stimulation (unattended), [phone]- Electrical stimulation (manual), 20560 (1-2 muscles), 20561 (3+ muscles)- Dry Needling, Joint mobilization, Joint manipulation, Spinal manipulation, and Spinal mobilization  PLAN FOR NEXT SESSION: discharge  8:32 AM, 09/02/23 Khamille Beynon Small Brnadon Eoff MPT [facility] [place] [phone] [phone]

## 2023-09-04 ENCOUNTER — Encounter (HOSPITAL_COMMUNITY): Admitting: Physical Therapy

## 2023-09-04 DIAGNOSIS — E1140 Type 2 diabetes mellitus with diabetic neuropathy, unspecified: Secondary | ICD-10-CM | POA: Diagnosis not present

## 2023-09-04 DIAGNOSIS — D6869 Other thrombophilia: Secondary | ICD-10-CM | POA: Diagnosis not present

## 2023-09-04 DIAGNOSIS — I4891 Unspecified atrial fibrillation: Secondary | ICD-10-CM | POA: Diagnosis not present

## 2023-09-04 DIAGNOSIS — I130 Hypertensive heart and chronic kidney disease with heart failure and stage 1 through stage 4 chronic kidney disease, or unspecified chronic kidney disease: Secondary | ICD-10-CM | POA: Diagnosis not present

## 2023-09-04 DIAGNOSIS — I2510 Atherosclerotic heart disease of native coronary artery without angina pectoris: Secondary | ICD-10-CM | POA: Diagnosis not present

## 2023-09-08 ENCOUNTER — Telehealth: Payer: Self-pay | Admitting: Pharmacy Technician

## 2023-09-08 ENCOUNTER — Other Ambulatory Visit: Payer: Self-pay | Admitting: *Deleted

## 2023-09-08 DIAGNOSIS — I6523 Occlusion and stenosis of bilateral carotid arteries: Secondary | ICD-10-CM

## 2023-09-08 NOTE — Telephone Encounter (Signed)
 Faxed bms eliquis  application  Scanned provider and patient in media

## 2023-09-08 NOTE — Telephone Encounter (Signed)
 Provider portion filled out and faxed back. Please scan into epic

## 2023-09-08 NOTE — Telephone Encounter (Signed)
 Hi, we received the patient portion of the eliquis  application so we just need the provider portion. Scanned in media under eliquis  bms provider portion blank is the blank form, if someone can please get the provider to sign and fax back to us  at [phone]? Thank you!

## 2023-09-09 ENCOUNTER — Other Ambulatory Visit (HOSPITAL_COMMUNITY): Payer: Self-pay

## 2023-09-09 ENCOUNTER — Encounter (HOSPITAL_COMMUNITY): Admitting: Physical Therapy

## 2023-09-09 NOTE — Telephone Encounter (Signed)
   Eliquis  says filled 09/03/23 per test claim'  I called [facility] and they said the copay was 152.71 for 30 days. They said he did pick that fill up from 09/03/23.   Too soon until [phone] to see what new copay would be or what copay would be for xarelto.  I called the patient and left him a message

## 2023-09-10 NOTE — Telephone Encounter (Signed)
 I spoke to the patient and made him aware of the denial. I will call him on 09/26/23 and see what the copay of eliquis  would be then or xarelto

## 2023-09-11 ENCOUNTER — Encounter (HOSPITAL_COMMUNITY)

## 2023-09-11 DIAGNOSIS — E6609 Other obesity due to excess calories: Secondary | ICD-10-CM | POA: Diagnosis not present

## 2023-09-11 DIAGNOSIS — Z6832 Body mass index (BMI) 32.0-32.9, adult: Secondary | ICD-10-CM | POA: Diagnosis not present

## 2023-09-11 DIAGNOSIS — F419 Anxiety disorder, unspecified: Secondary | ICD-10-CM | POA: Diagnosis not present

## 2023-09-11 DIAGNOSIS — G894 Chronic pain syndrome: Secondary | ICD-10-CM | POA: Diagnosis not present

## 2023-09-16 ENCOUNTER — Encounter (HOSPITAL_COMMUNITY)

## 2023-09-17 ENCOUNTER — Ambulatory Visit: Admitting: Cardiology

## 2023-09-18 ENCOUNTER — Encounter (HOSPITAL_COMMUNITY)

## 2023-09-20 DIAGNOSIS — E1165 Type 2 diabetes mellitus with hyperglycemia: Secondary | ICD-10-CM | POA: Diagnosis not present

## 2023-09-23 ENCOUNTER — Encounter (HOSPITAL_COMMUNITY)

## 2023-09-24 DIAGNOSIS — L820 Inflamed seborrheic keratosis: Secondary | ICD-10-CM | POA: Diagnosis not present

## 2023-09-24 DIAGNOSIS — I872 Venous insufficiency (chronic) (peripheral): Secondary | ICD-10-CM | POA: Diagnosis not present

## 2023-09-25 ENCOUNTER — Encounter (HOSPITAL_COMMUNITY)

## 2023-09-26 ENCOUNTER — Other Ambulatory Visit (HOSPITAL_COMMUNITY): Payer: Self-pay

## 2023-09-26 NOTE — Telephone Encounter (Signed)
 Hi, I spoke to the patient and he is wondering if he would be able to change to the generic pradaxa since he was denied assistance for eliquis  and his eliquis  is still 152.71 for 30 days. He has 12 more days of eliquis  left.   per test claims: Eliquis  5mg  would be 152.71 for 30 days Xarelto 20mg  would be 150.63 for 30 days Dabigatran 150mg  would be 53.03 for 30 days

## 2023-09-29 ENCOUNTER — Other Ambulatory Visit: Payer: Self-pay

## 2023-09-29 ENCOUNTER — Other Ambulatory Visit (HOSPITAL_COMMUNITY): Payer: Self-pay

## 2023-09-29 MED ORDER — DABIGATRAN ETEXILATE MESYLATE 150 MG PO CAPS
150.0000 mg | ORAL_CAPSULE | Freq: Two times a day (BID) | ORAL | 5 refills | Status: DC
  Filled 2023-09-29: qty 60, 30d supply, fill #0
  Filled 2023-11-04 – 2023-11-05 (×3): qty 60, 30d supply, fill #1
  Filled 2023-12-02 – 2023-12-04 (×3): qty 60, 30d supply, fill #2

## 2023-09-29 NOTE — Telephone Encounter (Signed)
 Dabigatran  150mg  bid would be ok to change to from eliquis . Eliquis  is a better medication but I understand the cost issues and dabigatran  is a reasonable alternative  JINNY Ross MD

## 2023-09-29 NOTE — Addendum Note (Signed)
 Addended by: Rosea Dory on: 09/29/2023 04:46 PM   Modules accepted: Orders

## 2023-09-29 NOTE — Telephone Encounter (Signed)
 Patient informed and verbalized understanding of plan. Patient is okay with this change. Spoke with Olam Hymen, RN and she advised that patient can finish out eliquis  and switch straight to Pradaxa . Patient informed and verbalized understanding of plan. Eliquis  D/C  New Rx sent in.

## 2023-09-30 ENCOUNTER — Encounter: Payer: Self-pay | Admitting: Pharmacist

## 2023-09-30 ENCOUNTER — Other Ambulatory Visit: Payer: Self-pay

## 2023-09-30 ENCOUNTER — Other Ambulatory Visit (HOSPITAL_COMMUNITY): Payer: Self-pay

## 2023-10-07 ENCOUNTER — Ambulatory Visit: Admitting: Vascular Surgery

## 2023-10-07 ENCOUNTER — Encounter: Payer: Self-pay | Admitting: Vascular Surgery

## 2023-10-07 ENCOUNTER — Ambulatory Visit (INDEPENDENT_AMBULATORY_CARE_PROVIDER_SITE_OTHER)

## 2023-10-07 VITALS — BP 158/76 | HR 79 | Temp 97.6°F | Resp 18 | Ht 71.0 in | Wt 229.0 lb

## 2023-10-07 DIAGNOSIS — I6523 Occlusion and stenosis of bilateral carotid arteries: Secondary | ICD-10-CM | POA: Diagnosis not present

## 2023-10-07 NOTE — Progress Notes (Signed)
 Patient name: Xavier Caldwell MRN: [id number] DOB: 07-Aug-1950 Sex: male  REASON FOR VISIT: 1 year follow-up for surveillance of carotid artery disease  HPI: LEVII HAIRFIELD is a 73 y.o. male with history of coronary disease status post NSTEMI complicated by V. tach arrest, A. fib on Eliquis , carotid disease, HTN, HLD that presents for 1 year follow-up for surveillance of his carotid artery disease.  Previously underwent left carotid endarterectomy for an asymptomatic high-grade stenosis on 09/11/2020.  He also previously underwent right carotid endarterectomy on 03/29/2020 for symptomatic high-grade stenosis.  Prior to surgery he had presented to the [facility] ED with some vague lower extremity weakness that progressed to left upper and lower extremity weakness concerning for TIA.    No new concerns today.  No recent stroke or TIA symptoms.  Had his Eliquis  switched to Pradaxa  by his cardiologist due to cost.  States he is fully retired.   Past Medical History:  Diagnosis Date   Acute systolic (congestive) heart failure (HCC)    Anxiety    Arthritis    Atrial fibrillation (HCC)    Back pain    Chronic kidney disease    pt told by PA of Dr. Lenis that he had stage 3 kidney so they had me stop taking Metformin    Coronary artery disease    Diabetes mellitus without complication (HCC)    High cholesterol    History of gout    Hypertension    Myocardial infarction [facility]) 04/2018   Neuropathy    PAD (peripheral artery disease) (HCC)    Retinal detachment    One on the right and two on the left   Sleep apnea    Stop Bang score of 4   STEMI (ST elevation myocardial infarction) (HCC)    04/08/2018 PCI/DES RCA   Stroke [facility])    February 2022   VT (ventricular tachycardia) [place])     Past Surgical History:  Procedure Laterality Date   APPENDECTOMY     CARDIOVERSION N/A 04/13/2018   Procedure: CARDIOVERSION;  Surgeon: Raford Riggs, MD;  Location: [facility] ENDOSCOPY;  Service: Cardiovascular;   Laterality: N/A;   CATARACT EXTRACTION W/PHACO Right 04/25/2014   Procedure: CATARACT EXTRACTION PHACO AND INTRAOCULAR LENS PLACEMENT (IOC);  Surgeon: Dow JULIANNA Burke, MD;  Location: [facility] ORS;  Service: Ophthalmology;  Laterality: Right;  CDE:4.70   CATARACT EXTRACTION W/PHACO Left 07/11/2014   Procedure: CATARACT EXTRACTION PHACO AND INTRAOCULAR LENS PLACEMENT LEFT EYE CDE=8.32;  Surgeon: Dow JULIANNA Burke, MD;  Location: [facility] ORS;  Service: Ophthalmology;  Laterality: Left;   COLONOSCOPY WITH PROPOFOL  N/A 09/23/2016   [place]: 9 polyps removed, multiple tubular adenomas. recommended 3 yr surveillance   COLONOSCOPY WITH PROPOFOL  N/A 12/27/2019   ten sessile polyps, 4-9 mm in size in descending, hepatic flexure, and cecum, 11 mm polyp in sigmoid, three sessile polyps in rectum and mid rectum 2-3 mm in size. Tubular adenomas and hyperplastic. 3 year surveillance   CORONARY/GRAFT ACUTE MI REVASCULARIZATION N/A 04/07/2018   Procedure: Coronary/Graft Acute MI Revascularization;  Surgeon: Verlin Lonni BIRCH, MD;  Location: [facility] INVASIVE CV LAB;  Service: Cardiovascular;  Laterality: N/A;   ENDARTERECTOMY Right 03/29/2020   Procedure: RIGHT CAROTID ARTERY ENDARTERECTOMY WITH PATCH ANGIOPLASTY;  Surgeon: Gretta Lonni PARAS, MD;  Location: [facility] OR;  Service: Vascular;  Laterality: Right;   ENDARTERECTOMY Left 09/11/2020   Procedure: ENDARTERECTOMY CAROTID LEFT;  Surgeon: Gretta Lonni PARAS, MD;  Location: [facility] OR;  Service: Vascular;  Laterality: Left;   LEFT  HEART CATH AND CORONARY ANGIOGRAPHY N/A 04/07/2018   Procedure: LEFT HEART CATH AND CORONARY ANGIOGRAPHY;  Surgeon: Verlin Lonni BIRCH, MD;  Location: [facility] INVASIVE CV LAB;  Service: Cardiovascular;  Laterality: N/A;   MELANOMA EXCISION  07/2019   PATCH ANGIOPLASTY Left 09/11/2020   Procedure: PATCH ANGIOPLASTY;  Surgeon: Gretta Lonni PARAS, MD;  Location: [facility] OR;  Service: Vascular;  Laterality: Left;   POLYPECTOMY  09/23/2016   Procedure:  POLYPECTOMY;  Surgeon: Shaaron Lamar HERO, MD;  Location: [facility] ENDO SUITE;  Service: Endoscopy;;  colon   POLYPECTOMY  12/27/2019   Procedure: POLYPECTOMY;  Surgeon: Shaaron Lamar HERO, MD;  Location: [facility] ENDO SUITE;  Service: Endoscopy;;   RETINAL DETACHMENT SURGERY      Family History  Problem Relation Age of Onset   Hypertension Father    Colon cancer Neg Hx    Diabetes Neg Hx     SOCIAL HISTORY: Social History   Tobacco Use   Smoking status: Former    Current packs/day: 0.00    Average packs/day: 1.5 packs/day for 30.0 years (45.0 ttl pk-yrs)    Types: Cigarettes    Start date: 07/06/1982    Quit date: 07/05/2012    Years since quitting: 11.2    Passive exposure: Never   Smokeless tobacco: Never  Substance Use Topics   Alcohol use: No    No Known Allergies  Current Outpatient Medications  Medication Sig Dispense Refill   ALPRAZolam  (XANAX ) 0.5 MG tablet Take 0.5 mg by mouth 2 (two) times daily as needed for anxiety.     amLODipine  (NORVASC ) 10 MG tablet Take 1 tablet (10 mg total) by mouth daily. 30 tablet 1   atorvastatin  (LIPITOR ) 80 MG tablet TAKE ONE TABLET BY MOUTH EVERYDAY AT BEDTIME 90 tablet 2   carvedilol  (COREG ) 12.5 MG tablet Take 1 tablet (12.5 mg total) by mouth 2 (two) times daily. 180 tablet 3   Continuous Glucose Sensor (DEXCOM G7 SENSOR) MISC Change sensor every 10 days. Dx E11.65 9 each 3   dabigatran  (PRADAXA ) 150 MG CAPS capsule Take 1 capsule (150 mg total) by mouth 2 (two) times daily. 60 capsule 5   DULoxetine  (CYMBALTA ) 60 MG capsule Take 60 mg by mouth daily with lunch.      ezetimibe  (ZETIA ) 10 MG tablet Take 1 tablet (10 mg total) by mouth daily. 30 tablet 5   furosemide  (LASIX ) 20 MG tablet Take 3 tablets (60 mg total) by mouth every morning AND 2 tablets (40 mg total) every evening. 150 tablet 6   gabapentin  (NEURONTIN ) 300 MG capsule Take 300 mg by mouth 2 (two) times daily.      glimepiride  (AMARYL ) 4 MG tablet Take 0.5 tablets (2 mg total) by mouth  daily.     glucose blood (ONETOUCH ULTRA) test strip USE ONE TEST STRIP AS DIRECTED TWICE DAILY 100 each 0   hydrALAZINE  (APRESOLINE ) 50 MG tablet TAKE 1 TABLET BY MOUTH 3 TIMES DAILY 90 tablet 10   Insulin  Degludec (TRESIBA  FLEXTOUCH [place]) Inject 40 Units into the skin at bedtime.     metFORMIN  (GLUCOPHAGE ) 500 MG tablet Take 1 tablet (500 mg total) by mouth 2 (two) times daily with a meal. 180 tablet 3   mupirocin  ointment (BACTROBAN ) 2 % Apply 1 application topically daily as needed (melonoma).     nitroGLYCERIN  (NITROSTAT ) 0.4 MG SL tablet Place 1 tablet (0.4 mg total) under the tongue every 5 (five) minutes as needed (if no relief after 3rd dose proceed to ER  or call 911). 25 tablet 2   polyethylene glycol (MIRALAX  / GLYCOLAX ) 17 g packet Take 17 g by mouth 2 (two) times daily. 60 each 1   rOPINIRole  (REQUIP ) 1 MG tablet Take 1 mg by mouth 4 (four) times daily.      timolol  (TIMOPTIC ) 0.5 % ophthalmic solution INSTILL 1 DROP INTO EACH EYE TWICE DAILY (Patient taking differently: Place 1 drop into both eyes 2 (two) times daily.) 10 mL 10   UNIFINE PENTIPS 31G X 5 MM MISC USE TO INJECT INSULIN  AT BEDTIME (Patient taking differently: at bedtime.) 100 each 5   No current facility-administered medications for this visit.    REVIEW OF SYSTEMS:  [X]  denotes positive finding, [ ]  denotes negative finding Cardiac  Comments:  Chest pain or chest pressure:    Shortness of breath upon exertion:    Short of breath when lying flat:    Irregular heart rhythm:        Vascular    Pain in calf, thigh, or hip brought on by ambulation:    Pain in feet at night that wakes you up from your sleep:     Blood clot in your veins:    Leg swelling:         Pulmonary    Oxygen at home:    Productive cough:     Wheezing:         Neurologic    Sudden weakness in arms or legs:     Sudden numbness in arms or legs:     Sudden onset of difficulty speaking or slurred speech:    Temporary loss of vision in one  eye:     Problems with dizziness:         Gastrointestinal    Blood in stool:     Vomited blood:         Genitourinary    Burning when urinating:     Blood in urine:        Psychiatric    Major depression:         Hematologic    Bleeding problems:    Problems with blood clotting too easily:        Skin    Rashes or ulcers:        Constitutional    Fever or chills:      PHYSICAL EXAM: Vitals:   10/07/23 1425 10/07/23 1428  BP: 132/73 (!) 158/76  Pulse: 79   Resp: 18   Temp: 97.6 F (36.4 C)   TempSrc: Oral   SpO2:  94%  Weight: 229 lb (103.9 kg)   Height: 5' 11 (1.803 m)     GENERAL: The patient is a well-nourished male, in no acute distress. The vital signs are documented above. CARDIAC: There is a regular rate and rhythm.  VASCULAR:  Bilateral neck incisions well-healed MUSCULOSKELETAL: There are no major deformities or cyanosis. NEUROLOGIC: No focal weakness or paresthesias are detected.  Nerves II through XII grossly intact. SKIN: There are no ulcers or rashes noted. PSYCHIATRIC: The patient has a normal affect.  DATA:   Carotid duplex today shows both carotid endarterectomy sites widely patent with no restenosis  Assessment/Plan:  73 year old male presents for 1 year interval follow-up after left carotid endarterectomy on 09/11/2020 for an asymptomatic high-grade stenosis and also a right carotid endarterectomy on 03/29/2020 for a high-grade symptomatic stenosis.    Discussed his carotid duplex today shows no evidence of significant recurrent stenosis with patent endarterectomy sites bilaterally.  He has remained asymptomatic.  I will see him in one year with repeat carotid duplex for ongoing surveillance.  Discussed he call with questions or concerns.  Lonni DOROTHA Gaskins, MD [facility] Office: [phone]

## 2023-10-09 ENCOUNTER — Other Ambulatory Visit (HOSPITAL_COMMUNITY): Payer: Self-pay

## 2023-10-09 ENCOUNTER — Ambulatory Visit: Attending: Nurse Practitioner | Admitting: Nurse Practitioner

## 2023-10-09 ENCOUNTER — Encounter: Payer: Self-pay | Admitting: Nurse Practitioner

## 2023-10-09 VITALS — BP 118/70 | HR 65 | Ht 71.0 in | Wt 232.2 lb

## 2023-10-09 DIAGNOSIS — Z79899 Other long term (current) drug therapy: Secondary | ICD-10-CM

## 2023-10-09 DIAGNOSIS — I4891 Unspecified atrial fibrillation: Secondary | ICD-10-CM

## 2023-10-09 DIAGNOSIS — Z8679 Personal history of other diseases of the circulatory system: Secondary | ICD-10-CM

## 2023-10-09 DIAGNOSIS — I10 Essential (primary) hypertension: Secondary | ICD-10-CM

## 2023-10-09 DIAGNOSIS — I5032 Chronic diastolic (congestive) heart failure: Secondary | ICD-10-CM

## 2023-10-09 DIAGNOSIS — I2510 Atherosclerotic heart disease of native coronary artery without angina pectoris: Secondary | ICD-10-CM

## 2023-10-09 DIAGNOSIS — E785 Hyperlipidemia, unspecified: Secondary | ICD-10-CM | POA: Diagnosis not present

## 2023-10-09 DIAGNOSIS — I5033 Acute on chronic diastolic (congestive) heart failure: Secondary | ICD-10-CM

## 2023-10-09 DIAGNOSIS — R600 Localized edema: Secondary | ICD-10-CM | POA: Diagnosis not present

## 2023-10-09 MED ORDER — TORSEMIDE 20 MG PO TABS
20.0000 mg | ORAL_TABLET | Freq: Two times a day (BID) | ORAL | 0 refills | Status: DC
  Filled 2023-10-09 – 2023-10-13 (×2): qty 60, 30d supply, fill #0

## 2023-10-09 MED ORDER — POTASSIUM CHLORIDE CRYS ER 20 MEQ PO TBCR
20.0000 meq | EXTENDED_RELEASE_TABLET | Freq: Every day | ORAL | 0 refills | Status: DC
  Filled 2023-10-09 – 2023-10-13 (×2): qty 90, 90d supply, fill #0

## 2023-10-09 NOTE — Patient Instructions (Addendum)
 Medication Instructions:  Your physician has recommended you make the following change in your medication:  Please stop Lasix   Please start Torsemide  20 Mg twice daily  Please start Potassium 20 MeQ daily   Labwork: In 2 weeks with [facility]   Testing/Procedures: None   Follow-Up: Your physician recommends that you schedule a follow-up appointment in: 6 weeks   Any Other Special Instructions Will Be Listed Below (If Applicable).  Update us  on your swelling and weight trends in 1 week.   If you need a refill on your cardiac medications before your next appointment, please call your pharmacy.

## 2023-10-09 NOTE — Progress Notes (Unsigned)
 Cardiology Office Note   Date:  10/09/2023 ID:  Xavier Caldwell, DOB 07-16-1950, MRN [id number] PCP: Bertell Satterfield, MD  [facility] Providers Cardiologist:  Alvan Carrier, MD { Click to update primary MD,subspecialty MD or APP then REFRESH:1}    History of Present Illness Xavier Caldwell is a 73 y.o. male with a PMH of CAD, s/p STEMI in 2020 and complicated by VT arrest, hx of chest pain, HTN, HLD, chronic HFpEF, A-fib, carotid artery stenosis, s/p bilateral CEA's (followed by [facility]), dizziness, hx of hyperkalemia/AKI, who presents today for 6 week follow-up.   Previous cardiovascular history of inferior STEMI in 2020 and received drug-eluting stent to RCA, complicated by VT arrest requiring defib in the Cath Lab and by bradycardia, and requiring temporary pacing at the time.  Echo around that time showed LVEF 45 to 50%.  Most recent NST in June 2025 was negative for ischemia.  Last seen by Dr. Alvan on 08/13/2023. Pt noted some ongoing edema, and lasix  was increased to 60 mg in AM, 40 mg in PM, and bloodwork arranged to be checked in 2 weeks.   Today he presents for 6 week follow-up. He states he is doing the same since last office visit.  Leg swelling remains the same.  Taking Lasix  as instructed from last office visit. Denies any chest pain, palpitations, syncope, presyncope, dizziness, orthopnea, PND, significant weight changes, acute bleeding, or claudication.  ROS: Negative.  See HPI.  Studies Reviewed  EKG: No EKG is ordered today.   Carotid duplex 10/2023: Summary:  Right Carotid: There is no evidence of stenosis in the right ICA.   Left Carotid: There is no evidence of stenosis in the left ICA.   Vertebrals:  Bilateral vertebral arteries demonstrate antegrade flow.  Subclavians: Normal flow hemodynamics were seen in bilateral subclavian               arteries.   *See table(s) above for measurements and observations.    Lexiscan  07/2023:   The study is normal. There  are no perfusion defects consistent with prior infarct or current ischemia.  The study is low risk.   No ST deviation was noted.   LV perfusion is normal.   Left ventricular function is normal. Nuclear stress EF: 59%. The left ventricular ejection fraction is normal (55-65%). End diastolic cavity size is normal.   There is a small mild intensity apical defect present in the resting images only with normal wall motion consistent with apical thinning.   Echocardiogram 05/2022: 1. Left ventricular ejection fraction, by estimation, is 55 to 60%. The  left ventricle has normal function. Left ventricular endocardial border  not optimally defined to evaluate regional wall motion. There is moderate  left ventricular hypertrophy. Left  ventricular diastolic parameters are indeterminate.   2. Right ventricular systolic function is normal. The right ventricular  size is normal. Tricuspid regurgitation signal is inadequate for assessing  PA pressure.   3. Left atrial size was severely dilated.   4. Right atrial size was severely dilated.   5. The mitral valve is normal in structure. No evidence of mitral valve  regurgitation. No evidence of mitral stenosis.   6. The aortic valve is tricuspid. Aortic valve regurgitation is not  visualized. No aortic stenosis is present.   7. The pulmonic valve was abnormal.   8. The inferior vena cava is dilated in size with <50% respiratory  variability, suggesting right atrial pressure of 15 mmHg.   ABI's 03/2020:  Summary:  Right: Resting right ankle-brachial index indicates moderate right lower  extremity arterial disease. The right toe-brachial index is abnormal.   Left: Resting left ankle-brachial index is within normal range. No  evidence of significant left lower extremity arterial disease. The left  toe-brachial index is abnormal.    *See table(s) above for measurements and observations.    LHC 04/2018:  Prox RCA lesion is 100% stenosed. Ost LAD to  Prox LAD lesion is 30% stenosed. Mid LAD lesion is 40% stenosed. Ost 2nd Mrg to 2nd Mrg lesion is 40% stenosed. A drug-eluting stent was successfully placed using a STENT RESOLUTE ONYX 3.5X30. Post intervention, there is a 0% residual stenosis.   1. 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 Ventricular fibrillation/cardiac arrest with successful resuscitation 3. Successful PTCA/DES x 1 proximal RCA.  4. Successful aspiration thrombectomy distal RCA 5. Mild to moderate non-obstructive disease in the Circumflex and LAD   Recommendations: Will admit to ICU. Echo in the am. Will continue Aggrastat  for 18 hours. Will continue ASA/Brilinta  and statin. Will not start a beta blocker today given hypotension and bradycardia. Ace inh on hold with hypotension.    Physical Exam VS:  BP 118/70   Pulse 65   Ht 5' 11 (1.803 m)   Wt 232 lb 3.2 oz (105.3 kg)   SpO2 99%   BMI 32.39 kg/m        Wt Readings from Last 3 Encounters:  10/09/23 232 lb 3.2 oz (105.3 kg)  10/07/23 229 lb (103.9 kg)  08/13/23 230 lb (104.3 kg)    GEN: Well nourished, well developed in no acute distress NECK: No JVD; No carotid bruits CARDIAC: S1/S2, RRR, no murmurs, rubs, gallops RESPIRATORY:  Clear to auscultation without rales, wheezing or rhonchi  ABDOMEN: Soft, non-tender, non-distended EXTREMITIES:  Nonpitting edema to BLE; No deformity   ASSESSMENT AND PLAN  Chronic HFpEF, leg edema Stage C, NYHA class I-II symptoms.  EF normal at 55 to 60% in April 2024.  Does show some continued leg edema.  Will stop Lasix  and switch to torsemide  20 mg twice daily and obtain BMET and magnesium  in 1 to 2 weeks.  He will update us  in 1 week regarding his swelling and weight trends.  Will also start potassium 20 mill equivalents daily.  Continue rest of medication regimen. Low sodium diet, fluid restriction <2L, and daily weights encouraged. Educated to contact our office for weight gain of 2 lbs  overnight or 5 lbs in one week.  CAD, s/p STEMI, complicated by VT arrest and bradycardia Stable with no anginal symptoms. No indication for ischemic evaluation.  No medication changes at this time besides what is noted above. Heart healthy diet and regular cardiovascular exercise encouraged.   A-fib Denies any tachycardia or palpitations.  Heart rate is well-controlled today.  Continue current medication regimen.  Eliquis  was switched to Pradaxa  due to cost.  Denies any bleeding issues and doing well on current medication regimen.  No medication changes at this time.  4. HTN Blood pressure stable and at goal. Discussed to monitor BP at home at least 2 hours after medications and sitting for 5-10 minutes.  No medication changes at this time. Heart healthy diet and regular cardiovascular exercise encouraged.    5. HLD      6.  Carotid artery stenosis, s/p bilateral CEA's    {Are you ordering a CV Procedure (e.g. stress test, cath, DCCV, TEE, etc)?   Press F2        :  [id number]}  Dispo: ***  Signed, Almarie Crate, NP

## 2023-10-10 DIAGNOSIS — I10 Essential (primary) hypertension: Secondary | ICD-10-CM | POA: Diagnosis not present

## 2023-10-10 DIAGNOSIS — E1122 Type 2 diabetes mellitus with diabetic chronic kidney disease: Secondary | ICD-10-CM | POA: Diagnosis not present

## 2023-10-10 DIAGNOSIS — G629 Polyneuropathy, unspecified: Secondary | ICD-10-CM | POA: Diagnosis not present

## 2023-10-10 DIAGNOSIS — M1991 Primary osteoarthritis, unspecified site: Secondary | ICD-10-CM | POA: Diagnosis not present

## 2023-10-10 DIAGNOSIS — M150 Primary generalized (osteo)arthritis: Secondary | ICD-10-CM | POA: Diagnosis not present

## 2023-10-10 DIAGNOSIS — I130 Hypertensive heart and chronic kidney disease with heart failure and stage 1 through stage 4 chronic kidney disease, or unspecified chronic kidney disease: Secondary | ICD-10-CM | POA: Diagnosis not present

## 2023-10-10 DIAGNOSIS — I5022 Chronic systolic (congestive) heart failure: Secondary | ICD-10-CM | POA: Diagnosis not present

## 2023-10-10 DIAGNOSIS — M5030 Other cervical disc degeneration, unspecified cervical region: Secondary | ICD-10-CM | POA: Diagnosis not present

## 2023-10-10 DIAGNOSIS — E1140 Type 2 diabetes mellitus with diabetic neuropathy, unspecified: Secondary | ICD-10-CM | POA: Diagnosis not present

## 2023-10-10 DIAGNOSIS — I4891 Unspecified atrial fibrillation: Secondary | ICD-10-CM | POA: Diagnosis not present

## 2023-10-13 ENCOUNTER — Other Ambulatory Visit: Payer: Self-pay

## 2023-10-13 ENCOUNTER — Other Ambulatory Visit (HOSPITAL_COMMUNITY): Payer: Self-pay

## 2023-10-17 ENCOUNTER — Encounter: Payer: Self-pay | Admitting: Gastroenterology

## 2023-10-20 ENCOUNTER — Ambulatory Visit: Payer: Self-pay | Admitting: Cardiology

## 2023-10-21 DIAGNOSIS — E1165 Type 2 diabetes mellitus with hyperglycemia: Secondary | ICD-10-CM | POA: Diagnosis not present

## 2023-10-23 ENCOUNTER — Other Ambulatory Visit (HOSPITAL_COMMUNITY): Payer: Self-pay

## 2023-10-23 ENCOUNTER — Other Ambulatory Visit: Payer: Self-pay

## 2023-10-23 ENCOUNTER — Encounter: Payer: Self-pay | Admitting: Nurse Practitioner

## 2023-10-23 ENCOUNTER — Ambulatory Visit: Admitting: Nurse Practitioner

## 2023-10-23 VITALS — BP 128/80 | HR 70 | Ht 71.0 in | Wt 225.6 lb

## 2023-10-23 DIAGNOSIS — E1122 Type 2 diabetes mellitus with diabetic chronic kidney disease: Secondary | ICD-10-CM | POA: Diagnosis not present

## 2023-10-23 DIAGNOSIS — N182 Chronic kidney disease, stage 2 (mild): Secondary | ICD-10-CM

## 2023-10-23 DIAGNOSIS — E041 Nontoxic single thyroid nodule: Secondary | ICD-10-CM | POA: Diagnosis not present

## 2023-10-23 DIAGNOSIS — N1831 Chronic kidney disease, stage 3a: Secondary | ICD-10-CM | POA: Diagnosis not present

## 2023-10-23 DIAGNOSIS — E782 Mixed hyperlipidemia: Secondary | ICD-10-CM

## 2023-10-23 DIAGNOSIS — I10 Essential (primary) hypertension: Secondary | ICD-10-CM | POA: Diagnosis not present

## 2023-10-23 DIAGNOSIS — E042 Nontoxic multinodular goiter: Secondary | ICD-10-CM

## 2023-10-23 DIAGNOSIS — Z794 Long term (current) use of insulin: Secondary | ICD-10-CM | POA: Diagnosis not present

## 2023-10-23 LAB — POCT GLYCOSYLATED HEMOGLOBIN (HGB A1C): Hemoglobin A1C: 9.7 % — AB (ref 4.0–5.6)

## 2023-10-23 MED ORDER — METFORMIN HCL 500 MG PO TABS
500.0000 mg | ORAL_TABLET | Freq: Two times a day (BID) | ORAL | 3 refills | Status: DC
  Filled 2023-10-23 – 2024-02-22 (×3): qty 180, 90d supply, fill #0

## 2023-10-23 MED ORDER — DEXCOM G7 SENSOR MISC
3 refills | Status: AC
  Filled 2023-10-23: qty 9, 90d supply, fill #0

## 2023-10-23 MED ORDER — GLIMEPIRIDE 4 MG PO TABS: 2.0000 mg | ORAL_TABLET | Freq: Every day | ORAL | Status: DC

## 2023-10-23 MED ORDER — TRESIBA FLEXTOUCH 100 UNIT/ML ~~LOC~~ SOPN
40.0000 [IU] | PEN_INJECTOR | Freq: Every day | SUBCUTANEOUS | 3 refills | Status: AC
  Filled 2023-10-23: qty 36, 90d supply, fill #0

## 2023-10-23 NOTE — Progress Notes (Signed)
 Endocrinology Follow Up Note 10/23/23    ---------------------------------------------------------------------------------------------------------------------- Subjective    Past Medical History:  Diagnosis Date   Acute systolic (congestive) heart failure (HCC)    Anxiety    Arthritis    Atrial fibrillation (HCC)    Back pain    Chronic kidney disease    pt told by PA of Dr. Lenis that he had stage 3 kidney so they had me stop taking Metformin    Coronary artery disease    Diabetes mellitus without complication (HCC)    High cholesterol    History of gout    Hypertension    Myocardial infarction [facility] 04/2018   Neuropathy    PAD (peripheral artery disease) (HCC)    Retinal detachment    One on the right and two on the left   Sleep apnea    Stop Bang score of 4   STEMI (ST elevation myocardial infarction) (HCC)    04/08/2018 PCI/DES RCA   Stroke [facility])    February 2022   VT (ventricular tachycardia) [facility])     Past Surgical History:  Procedure Laterality Date   APPENDECTOMY     CARDIOVERSION N/A 04/13/2018   Procedure: CARDIOVERSION;  Surgeon: Raford Riggs, MD;  Location: [facility] ENDOSCOPY;  Service: Cardiovascular;  Laterality: N/A;   CATARACT EXTRACTION W/PHACO Right 04/25/2014   Procedure: CATARACT EXTRACTION PHACO AND INTRAOCULAR LENS PLACEMENT (IOC);  Surgeon: Dow JULIANNA Burke, MD;  Location: [facility] ORS;  Service: Ophthalmology;  Laterality: Right;  CDE:4.70   CATARACT EXTRACTION W/PHACO Left 07/11/2014   Procedure: CATARACT EXTRACTION PHACO AND INTRAOCULAR LENS PLACEMENT LEFT EYE CDE=8.32;  Surgeon: Dow JULIANNA Burke, MD;  Location: [facility] ORS;  Service: Ophthalmology;  Laterality: Left;   COLONOSCOPY WITH PROPOFOL  N/A 09/23/2016   Rourk: 9 polyps removed, multiple tubular adenomas. recommended 3 yr surveillance   COLONOSCOPY WITH PROPOFOL  N/A 12/27/2019   ten sessile polyps, 4-9 mm in size in descending, hepatic flexure, and cecum, 11 mm polyp in sigmoid, three sessile polyps  in rectum and mid rectum 2-3 mm in size. Tubular adenomas and hyperplastic. 3 year surveillance   CORONARY/GRAFT ACUTE MI REVASCULARIZATION N/A 04/07/2018   Procedure: Coronary/Graft Acute MI Revascularization;  Surgeon: Verlin Lonni BIRCH, MD;  Location: [facility] INVASIVE CV LAB;  Service: Cardiovascular;  Laterality: N/A;   ENDARTERECTOMY Right 03/29/2020   Procedure: RIGHT CAROTID ARTERY ENDARTERECTOMY WITH PATCH ANGIOPLASTY;  Surgeon: Gretta Lonni PARAS, MD;  Location: [facility] OR;  Service: Vascular;  Laterality: Right;   ENDARTERECTOMY Left 09/11/2020   Procedure: ENDARTERECTOMY CAROTID LEFT;  Surgeon: Gretta Lonni PARAS, MD;  Location[facility] OR;  Service: Vascular;  Laterality: Left;   LEFT HEART CATH AND CORONARY ANGIOGRAPHY N/A 04/07/2018   Procedure: LEFT HEART CATH AND CORONARY ANGIOGRAPHY;  Surgeon: Verlin Lonni BIRCH, MD;  Location: [facility] INVASIVE CV LAB;  Service: Cardiovascular;  Laterality: N/A;   MELANOMA EXCISION  07/2019   PATCH ANGIOPLASTY Left 09/11/2020   Procedure: PATCH ANGIOPLASTY;  Surgeon: Gretta Lonni PARAS, MD;  Location: [facility] OR;  Service: Vascular;  Laterality: Left;   POLYPECTOMY  09/23/2016   Procedure: POLYPECTOMY;  Surgeon: Shaaron Lamar HERO, MD;  Location: [facility] ENDO SUITE;  Service: Endoscopy;;  colon   POLYPECTOMY  12/27/2019   Procedure: POLYPECTOMY;  Surgeon: Shaaron Lamar HERO, MD;  Location: [facility] ENDO [facility];  Service: Endoscopy;;   RETINAL DETACHMENT SURGERY      Social History   Socioeconomic History   Marital status: Married    Spouse name: Not on file   Number of  children: Not on file   Years of education: Not on file   Highest education level: Not on file  Occupational History   Not on file  Tobacco Use   Smoking status: Former    Current packs/day: 0.00    Average packs/day: 1.5 packs/day for 30.0 years (45.0 ttl pk-yrs)    Types: Cigarettes    Start date: 07/06/1982    Quit date: 07/05/2012    Years since quitting: 11.3    Passive exposure: Never    Smokeless tobacco: Never  Vaping Use   Vaping status: Never Used  Substance and Sexual Activity   Alcohol use: No   Drug use: No   Sexual activity: Not Currently    Birth control/protection: None  Other Topics Concern   Not on file  Social History Narrative   Not on file   Social Drivers of Health   Financial Resource Strain: Low Risk  (06/06/2022)   Overall Financial Resource Strain (CARDIA)    Difficulty of Paying Living Expenses: Not hard at all  Food Insecurity: No Food Insecurity (09/20/2022)   Hunger Vital Sign    Worried About Running Out of Food in the Last Year: Never true    Ran Out of Food in the Last Year: Never true  Transportation Needs: No Transportation Needs (09/20/2022)   PRAPARE - Administrator, Civil Service (Medical): No    Lack of Transportation (Non-Medical): No  Physical Activity: Sufficiently Active (09/20/2022)   Exercise Vital Sign    Days of Exercise per Week: 5 days    Minutes of Exercise per Session: 60 min  Stress: No Stress Concern Present (06/26/2022)   [employer] of Occupational Health - Occupational Stress Questionnaire    Feeling of Stress : Only a little  Social Connections: Socially Integrated (12/07/2021)   Social Connection and Isolation Panel    Frequency of Communication with Friends and Family: More than three times a week    Frequency of Social Gatherings with Friends and Family: More than three times a week    Attends Religious Services: More than 4 times per year    Active Member of [employer] or Organizations: Yes    Attends Engineer, structural: More than 4 times per year    Marital Status: Married  Catering manager Violence: Not on file    Current Outpatient Medications on File Prior to Visit  Medication Sig Dispense Refill   ALPRAZolam  (XANAX ) 0.5 MG tablet Take 0.5 mg by mouth 2 (two) times daily as needed for anxiety.     amLODipine  (NORVASC ) 10 MG tablet Take 1 tablet (10 mg total) by mouth daily. 30  tablet 1   atorvastatin  (LIPITOR ) 80 MG tablet TAKE ONE TABLET BY MOUTH EVERYDAY AT BEDTIME 90 tablet 2   carvedilol  (COREG ) 12.5 MG tablet Take 1 tablet (12.5 mg total) by mouth 2 (two) times daily. 180 tablet 3   dabigatran  (PRADAXA ) 150 MG CAPS capsule Take 1 capsule (150 mg total) by mouth 2 (two) times daily. 60 capsule 5   DULoxetine  (CYMBALTA ) 60 MG capsule Take 60 mg by mouth daily with lunch.      ezetimibe  (ZETIA ) 10 MG tablet Take 1 tablet (10 mg total) by mouth daily. 30 tablet 5   gabapentin  (NEURONTIN ) 300 MG capsule Take 300 mg by mouth 2 (two) times daily.      glucose blood (ONETOUCH ULTRA) test strip USE ONE TEST STRIP AS DIRECTED TWICE DAILY 100 each 0   hydrALAZINE  (  APRESOLINE ) 50 MG tablet TAKE 1 TABLET BY MOUTH 3 TIMES DAILY 90 tablet 10   mupirocin  ointment (BACTROBAN ) 2 % Apply 1 application topically daily as needed (melonoma).     nitroGLYCERIN  (NITROSTAT ) 0.4 MG SL tablet Place 1 tablet (0.4 mg total) under the tongue every 5 (five) minutes as needed (if no relief after 3rd dose proceed to ER or call 911). 25 tablet 2   polyethylene glycol (MIRALAX  / GLYCOLAX ) 17 g packet Take 17 g by mouth 2 (two) times daily. 60 each 1   potassium chloride  SA (KLOR-CON  M) 20 MEQ tablet Take 1 tablet (20 mEq total) by mouth daily. 90 tablet 0   rOPINIRole  (REQUIP ) 1 MG tablet Take 1 mg by mouth 4 (four) times daily.      timolol  (TIMOPTIC ) 0.5 % ophthalmic solution INSTILL 1 DROP INTO EACH EYE TWICE DAILY (Patient taking differently: Place 1 drop into both eyes 2 (two) times daily.) 10 mL 10   torsemide  (DEMADEX ) 20 MG tablet Take 1 tablet (20 mg total) by mouth 2 (two) times daily. 60 tablet 0   UNIFINE PENTIPS 31G X 5 MM MISC USE TO INJECT INSULIN  AT BEDTIME (Patient taking differently: at bedtime.) 100 each 5   No current facility-administered medications on file prior to visit.      HPI  Diabetes He presents for his follow-up diabetic visit. He has type 2 diabetes mellitus.  Onset time: diagnosed at approx age of 14. His disease course has been worsening. Hypoglycemia symptoms include dizziness, nervousness/anxiousness, sweats and tremors. Associated symptoms include blurred vision (hx detached retina) and fatigue. Hypoglycemia complications include nocturnal hypoglycemia. Symptoms are stable. Diabetic complications include a CVA and heart disease (previous MI). Risk factors for coronary artery disease include diabetes mellitus, dyslipidemia, family history, obesity, male sex and hypertension. Current diabetic treatment includes insulin  injections and oral agent (dual therapy). He is compliant with treatment most of the time. His weight is fluctuating minimally. He is following a generally healthy diet. When asked about meal planning, he reported none. He has not had a previous visit with a dietitian. He participates in exercise daily. His home blood glucose trend is fluctuating dramatically. His overall blood glucose range is >200 mg/dl. (He presents today with his CGM showing fluctuating glycemic profile overall.  His POCT A1c today is 9.7%, increasing from last visit of 8.5%.  He does have mild fasting hypoglycemia noted- likely related to sleeping late.  He admits he has some work to do with diet.  Analysis of his CGM shows TIR 25%, TAR 75 TBR 0% with a GMI of 9.5%.) An ACE inhibitor/angiotensin II receptor blocker is not being taken. He sees a podiatrist.Eye exam is current.    Xavier Caldwell is a 73 y.o.-year-old male, referred by his PCP, Dr. Bertell, for evaluation for multinodular goiter and now for uncontrolled diabetes management.  His nodule was incidentally found on CT chest following up on possible lung nodule.  He was then sent for thyroid  ultrasound to assess in depth. And subsequently was sent for FNA of mildly suspicious thyroid  nodule.  Unfortunately, the specimen obtained was not enough to rule out malignancy, thus needed to be repeated.  The repeat biopsy was  sufficient to determine benignity.     I reviewed pt's thyroid  tests: Lab Results  Component Value Date   TSH 0.441 (L) 02/12/2023   TSH 0.601 05/22/2022   TSH 0.434 (L) 03/04/2022   TSH 0.17 (L) 09/21/2020   TSH 0.166 (L) 02/29/2020  TSH 0.668 04/17/2018   TSH 0.547 04/11/2018   TSH 0.314 (L) 01/07/2017   TSH 0.410 (L) 01/01/2016   FREET4 1.12 02/12/2023   FREET4 1.31 05/22/2022   FREET4 0.99 03/04/2022   FREET4 0.82 09/21/2020   FREET4 1.20 02/29/2020   FREET4 1.13 01/07/2017   FREET4 1.05 01/01/2016    No FH of thyroid  ds. No FH of thyroid  cancer. No h/o radiation tx to head or neck.  No seaweed or kelp. No recent contrast studies. No steroid use. No herbal supplements. No Biotin supplements or Hair, Skin and Nails vitamins.  Pt also has a history of DM, HLD, PVD, Afib, CAD  with MI, HTN.  Review of systems  Constitutional: + Minimally fluctuating body weight,  current Body mass index is 31.46 kg/m. , no fatigue, no subjective hyperthermia, no subjective hypothermia Eyes: no blurry vision, no xerophthalmia ENT: no sore throat, no nodules palpated in throat, no dysphagia/odynophagia, no hoarseness Cardiovascular: no chest pain, no shortness of breath, no palpitations, no leg swelling Respiratory: no cough, no shortness of breath Gastrointestinal: no nausea/vomiting/diarrhea Musculoskeletal: no muscle/joint aches Skin: no rashes, no hyperemia Neurological: no tremors, no numbness, no tingling, no dizziness Psychiatric: no depression, no anxiety  ---------------------------------------------------------------------------------------------------------------------- Objective    BP 128/80 (BP Location: Right Arm, Patient Position: Sitting, Cuff Size: Large)   Pulse 70   Ht 5' 11 (1.803 m)   Wt 225 lb 9.6 oz (102.3 kg)   BMI 31.46 kg/m    BP Readings from Last 3 Encounters:  10/23/23 128/80  10/09/23 118/70  10/07/23 (!) 158/76    Wt Readings from Last 3  Encounters:  10/23/23 225 lb 9.6 oz (102.3 kg)  10/09/23 232 lb 3.2 oz (105.3 kg)  10/07/23 229 lb (103.9 kg)     Physical Exam- Limited  Constitutional:  Body mass index is 31.46 kg/m. , not in acute distress, normal state of mind Eyes:  EOMI, no exophthalmos Musculoskeletal: no gross deformities, strength intact in all four extremities, no gross restriction of joint movements Skin:  no rashes, no hyperemia Neurological: no tremor with outstretched hands    Thyroid  Ultrasound from 01/21/22 CLINICAL DATA:  Incidental on CT.   EXAM: THYROID  ULTRASOUND   TECHNIQUE: Ultrasound examination of the thyroid  gland and adjacent soft tissues was performed.   COMPARISON:  CT chest January 14, 2022   FINDINGS: Parenchymal Echotexture: Moderately heterogenous   Isthmus: 1.1 cm   Right lobe: 7.8 x 3.1 x 3.0 cm   Left lobe: 6.6 x 3.2 x 2.8 cm   _________________________________________________________   Estimated total number of nodules >/= 1 cm:   Number of spongiform nodules >/=  2 cm not described below (TR1): 0   Number of mixed cystic and solid nodules >/= 1.5 cm not described below (TR2): 0   _________________________________________________________   Nodule labeled 4 appears to be a mixed cystic and solid isoechoic TR 2 nodule in the thyroid  isthmus measuring 1.5 cm. This nodule does NOT meet TI-RADS criteria for biopsy or dedicated follow-up.   Nodule labeled 1 is a mixed cystic and solid isoechoic TR 2 nodule in the superior right thyroid  lobe measuring 1.7 cm. This nodule does NOT meet TI-RADS criteria for biopsy or dedicated follow-up.   Nodule labeled 2 is a mixed cystic and solid isoechoic TR 2 nodule in the mid right thyroid  lobe measuring 1.1 cm. This nodule does NOT meet TI-RADS criteria for biopsy or dedicated follow-up.   Nodule labeled 3 appears to be best  described as a solid isoechoic TR 3 nodule in the inferior right thyroid  lobe measuring 3.7 x  3.2 x 3.0 cm. **Given size (>/= 2.5 cm) and appearance, fine needle aspiration of this mildly suspicious nodule should be considered based on TI-RADS criteria.   Nodule labeled 5 is a mixed cystic and solid isoechoic TR 2 nodule in the superior left thyroid  lobe measuring 1.5 cm. This nodule does NOT meet TI-RADS criteria for biopsy or dedicated follow-up.   Nodule labeled 6 is a mixed cystic and solid isoechoic TR 2 nodule in the posterior aspect of the mid left thyroid  lobe measuring 1.5 cm. This nodule does NOT meet TI-RADS criteria for biopsy or dedicated follow-up.   IMPRESSION: 1. Enlarged multinodular thyroid  gland. 2. Nodule labeled 3 in the inferior right thyroid  lobe (3.7 cm TR 3) meets criteria for biopsy. 3. A number of additional benign-appearing nodules as described do not meet criteria for further dedicated follow-up or biopsy.   The above is in keeping with the ACR TI-RADS recommendations - J Am Coll Radiol 2017;14:587-595.     Electronically Signed   By: Felton Fry M.D.   On: 01/22/2022 14:02  FNA on 03/12/22  CYTOLOGY - NON PAP  CASE: [id number]  PATIENT: Raeqwon Monestime  Non-Gynecological Cytology Report      Clinical History: 3.7 cm RLL; TR3  Specimen Submitted:  A. THYROID , RLL, FINE NEEDLE ASPIRATION:    FINAL MICROSCOPIC DIAGNOSIS:  - Scant follicular epithelium present (Bethesda category I)   SPECIMEN ADEQUACY:  Satisfactory but limited for evaluation, scant cellularity   DIAGNOSTIC COMMENTS:  Inflammation present.   GROSS:  Received is/are 6 slides in 95% Ethyl alcohol, and 30 ccs of pale pink  Cytolyt solution. (CM:cm)  Prepared:  Smears: 6  Concentration Method (Thin Prep):  1  Cell Block:  Cell block attempted, not obtained.  Additional Studies:  Also there was an Afirma collected.  ------------------------------------------------------------------------------------------------------------- Repeat FNA 06/19/22 CYTOLOGY - NON  PAP CASE: [id number] PATIENT: Aaliyah Bedoya Non-Gynecological Cytology Report  Clinical History: Thyroid  nodule Specimen Submitted:  A. THYROID , RIGHT LOWER LOBE, FINE NEEDLE ASPIRATION:  FINAL MICROSCOPIC DIAGNOSIS: - Consistent with benign follicular nodule (Bethesda category II)  SPECIMEN ADEQUACY: Satisfactory for evaluation  DIAGNOSTIC COMMENTS: The aspirate shows abundant colloid with scattered follicular groups and macrophages consistent with a benign follicular/colloid nodule (follicular nodular disease).  GROSS: Received is/are 6 slides in 95% Ethyl alcohol, and 30ccs of pale beige Cytolyt solution. (TS:ts) Prepared: Smears:  6 Concentration Method (Thin Prep):  1 Cell Block:  Cell block attempted, not obtained. Additional Studies:  Afirma collected.     Final Diagnosis performed by Prentice Pitcher, MD.   Electronically signed 06/21/2022 Technical component performed at [facility], [address] [place], [place] [address].  Professional component performed at [employer]. [facility], [address] [place], [place] [address].  Immunohistochemistry Technical component (if applicable) was performed at [facility]. [address], [address], [place], [place] [address].   IMMUNOHISTOCHEMISTRY DISCLAIMER (if applicable): Some of these immunohistochemical stains may have been developed and the performance characteristics determine by [facility]. Some may not have been cleared or approved by the U.S. Food and Drug Administration. The FDA has determined that such clearance or approval is not necessary. This test is used for clinical purposes. It should not be regarded as investigational or for research. This laboratory is certified under the Clinical Laboratory Improvement Amendments of 1988 (CLIA-88) as qualified to perform  high complexity clinical laboratory testing.  The controls stained appropriately.   ------------------------------------------------------------------------------------------------------------------------- Thyroid  US  from 01/20/23  CLINICAL DATA:  73 year old male with multinodular thyroid    EXAM: THYROID  ULTRASOUND   TECHNIQUE: Ultrasound examination of the thyroid  gland and adjacent soft tissues was performed.   COMPARISON:  01/21/2022, biopsy right inferior thyroid  nodule 03/13/2022   Biopsy right inferior thyroid  nodule 06/19/2022   FINDINGS: Parenchymal Echotexture: Moderately heterogenous   Isthmus: 0.8 cm   Right lobe: 6.2 cm x 2.3 cm x 2.0 cm   Left lobe: 6.7 cm x 2.9 cm x 2.9 cm   _________________________________________________________   Estimated total number of nodules >/= 1 cm: 6-10   Number of spongiform nodules >/=  2 cm not described below (TR1): 0   Number of mixed cystic and solid nodules >/= 1.5 cm not described below (TR2): 0   _________________________________________________________   Nodule labeled 1 in the superior right thyroid , 1.6 cm, previously 1.7 cm. Nodule has clear spongiform characteristics on current and previous and does not meet criteria for surveillance.   Nodule labeled 2 mid right thyroid , 1.4 cm. Nodule has clear spongiform characteristics on the prior and the current and does not meet criteria for surveillance.   Nodule labeled 3 inferior right thyroid , 3.4 cm. Nodule has undergone biopsy on 2 prior occasion, 06/19/2022, 03/13/2022. Assuming benign result no further specific follow-up would be indicated.   Nodule labeled 4 on the current, isthmus, 1.5 cm and unchanged. This nodule has TR 2/cystic characteristics and does not meet criteria for surveillance.   Nodule labeled 5 superior left thyroid , 2.4 cm. Nodule has a differing measurement to the prior of 1.5 cm, attributable to inclusion of heterogeneous tissue adjacent to the nodule on the current. The cystic nodule appears unchanged to the prior  and given the TR 2 characteristics does not meet criteria for surveillance.   Nodule labeled 6, inferior left thyroid , 1.5 cm. This nodule remains TR 2/cystic and does not meet criteria for surveillance.   No adenopathy   IMPRESSION: Similar appearance of multi cystic/nodular thyroid  as above.   Assuming benign biopsy of inferior right thyroid  nodule (labeled 3), no further specific follow-up would be indicated.   Above recommendations follow those established by the new ACR TI-RADS criteria (J Am Coll Radiol 2017;14:587-595).     Electronically Signed   By: Ami Bellman D.O.   On: 01/20/2023 12:46   Latest Reference Range & Units 04/17/18 08:29 02/29/20 10:22 09/21/20 09:30 03/04/22 09:01 05/22/22 09:25 02/12/23 07:48  TSH 0.450 - 4.500 uIU/mL 0.668 0.166 (L) 0.17 (L) 0.434 (L) 0.601 0.441 (L)  Triiodothyronine,Free,Serum 2.0 - 4.4 pg/mL    3.2 3.1 3.0  T4,Free(Direct) 0.82 - 1.77 ng/dL  8.79 9.17 9.00 8.68 8.87  Thyroxine (T4) 4.5 - 12.0 ug/dL 9.2       Thyroperoxidase Ab SerPl-aCnc 0 - 34 IU/mL    10    Thyroglobulin Antibody 0.0 - 0.9 IU/mL    <1.0    (L): Data is abnormally low  ----------------------------------------------------------------------------------------------------------------------  ASSESSMENT / PLAN:  1. Thyroid  Nodule  -He had FNA on 03/12/22 to assess for malignancy of mildly suspicious TR3 nodule which unfortunately only had scant cellularity present, therefore malignancy could not be ruled out.  This biopsy was repeated once inflammation had decreased and was determined to be benign.  The rest of the nodules found on previous ultrasound do not meet criteria for additional follow up.  Repeat US  from 01/20/23 shows stable cystic multinodular thyroid  gland.  No further follow up is needed at this time.  His thyroid  antibody testing was negative, ruling out autoimmune thyroid  dysfunction.    His previous thyroid  function tests show euthyroid presentation.   He will not need any anti-thyroid  treatment or thyroid  hormone replacement at this time.    2. 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  His POCT A1c today is 9.7%, increasing from last visit of 8.5%.  He does have mild fasting hypoglycemia noted- likely related to sleeping late.  He admits he has some work to do with diet.  Analysis of his CGM shows TIR 25%, TAR 75 TBR 0% with a GMI of 9.5%.  - Patient has currently uncontrolled symptomatic type 2 DM since 73 years of age.   -Recent labs reviewed.  - I had a long discussion with him about the progressive nature of diabetes and the pathology behind its complications. -His diabetes is complicated by CVA and MI in the past and he remains at a high risk for more acute and chronic complications which include CAD, CVA, CKD, retinopathy, and neuropathy. These are all discussed in detail with him.  The following Lifestyle Medicine recommendations according to American College of Lifestyle Medicine [facility]) were discussed and offered to patient and he agrees to start the journey:  A. Whole Foods, Plant-based plate comprising of fruits and vegetables, plant-based proteins, whole-grain carbohydrates was discussed in detail with the patient.   A list for source of those nutrients were also provided to the patient.  Patient will use only water  or unsweetened tea for hydration. B.  The need to stay away from risky substances including alcohol, smoking; obtaining 7 to 9 hours of restorative sleep, at least 150 minutes of moderate intensity exercise weekly, the importance of healthy social connections,  and stress reduction techniques were discussed. C.  A full color page of  Calorie density of various food groups per pound showing examples of each food groups was provided to the patient.  - Nutritional counseling repeated at each appointment due  to patients tendency to fall back in to old habits.  - The patient admits there is a room for improvement in their diet and drink choices. -  Suggestion is made for the patient to avoid simple carbohydrates from their diet including Cakes, Sweet Desserts / Pastries, Ice Cream, Soda (diet and regular), Sweet Tea, Candies, Chips, Cookies, Sweet Pastries, Store Bought Juices, Alcohol in Excess of 1-2 drinks a day, Artificial Sweeteners, Coffee Creamer, and Sugar-free Products. This will help patient to have stable blood glucose profile and potentially avoid unintended weight gain.   - I encouraged the patient to switch to unprocessed or minimally processed complex starch and increased protein intake (animal or plant source), fruits, and vegetables.   - Patient is advised to stick to a routine mealtimes to eat 3 meals a day and avoid unnecessary snacks (to snack only to correct hypoglycemia).  - He will be scheduled with Penny Crumpton, RDN, CDE for diabetes education.  - I have approached him with the following individualized plan to manage his diabetes and patient agrees:   -He is advised to continue his Tresiba  40 units SQ nightly and Metformin  500 mg po twice daily, and Glimepiride  2 mg po daily at breakfast.  He will do best to avoid sweetened beverages and processed foods.  We did talk about the possibility of adding  meal time insulin  if he cannot regain control.  -He is encouraged to continue monitoring glucose 2 times daily (using his CGM), before breakfast and before bed, and to call the clinic if he has readings less than 70 or above 300 for 3 tests in a row.  I asked he reach out in 2 weeks with readings so we can further adjust medications if needed to avoid hypoglycemia.  - He is warned not to take insulin  without proper monitoring per orders. - Adjustment parameters are given to him for hypo and hyperglycemia in writing.  - He will be considered for incretin therapy as appropriate  next visit.  - Specific targets for  A1c; LDL, HDL, and Triglycerides were discussed with the patient.   FOLLOW UP PLAN: Return in about 3 months (around 01/22/2024) for Diabetes F/U with A1c in office, Previsit labs, Bring meter and logs.       I spent  30  minutes in the care of the patient today including review of labs from CMP, Lipids, Thyroid  Function, Hematology (current and previous including abstractions from other facilities); face-to-face time discussing  his blood glucose readings/logs, discussing hypoglycemia and hyperglycemia episodes and symptoms, medications doses, his options of short and long term treatment based on the latest standards of care / guidelines;  discussion about incorporating lifestyle medicine;  and documenting the encounter. Risk reduction counseling performed per USPSTF guidelines to reduce obesity and cardiovascular risk factors.     Please refer to Patient Instructions for Blood Glucose Monitoring and Insulin /Medications Dosing Guide  in media tab for additional information. Please  also refer to  Patient Self Inventory in the Media  tab for reviewed elements of pertinent patient history.  Tanda JAYSON [place] participated in the discussions, expressed understanding, and voiced agreement with the above plans.  All questions were answered to his satisfaction. he is encouraged to contact clinic should he have any questions or concerns prior to his return visit.    Benton Rio, [facility] [address] [place], [place] [address] Phone: [phone] Fax: [phone]

## 2023-10-24 ENCOUNTER — Other Ambulatory Visit (HOSPITAL_COMMUNITY): Payer: Self-pay

## 2023-10-27 ENCOUNTER — Other Ambulatory Visit (HOSPITAL_COMMUNITY): Payer: Self-pay

## 2023-11-05 ENCOUNTER — Other Ambulatory Visit (HOSPITAL_COMMUNITY): Payer: Self-pay

## 2023-11-05 ENCOUNTER — Other Ambulatory Visit: Payer: Self-pay

## 2023-11-12 LAB — BASIC METABOLIC PANEL WITH GFR
BUN/Creatinine Ratio: 22 (ref 10–24)
BUN: 25 mg/dL (ref 8–27)
CO2: 22 mmol/L (ref 20–29)
Calcium: 8.7 mg/dL (ref 8.6–10.2)
Chloride: 100 mmol/L (ref 96–106)
Creatinine, Ser: 1.16 mg/dL (ref 0.76–1.27)
Glucose: 147 mg/dL — ABNORMAL HIGH (ref 70–99)
Potassium: 4.5 mmol/L (ref 3.5–5.2)
Sodium: 136 mmol/L (ref 134–144)
eGFR: 67 mL/min/1.73 (ref >59)

## 2023-11-12 LAB — MAGNESIUM: Magnesium: 2.1 mg/dL (ref 1.6–2.3)

## 2023-11-13 ENCOUNTER — Ambulatory Visit: Payer: Self-pay | Admitting: Nurse Practitioner

## 2023-11-13 ENCOUNTER — Telehealth: Payer: Self-pay | Admitting: Cardiology

## 2023-11-13 NOTE — Telephone Encounter (Signed)
 Notified via detailed voice message & mychart.

## 2023-11-13 NOTE — Telephone Encounter (Signed)
 Pt is requesting a callback about his labs 10/6. Please advise.

## 2023-12-02 ENCOUNTER — Ambulatory Visit: Payer: Self-pay | Attending: Nurse Practitioner | Admitting: Nurse Practitioner

## 2023-12-02 ENCOUNTER — Other Ambulatory Visit (HOSPITAL_COMMUNITY): Payer: Self-pay

## 2023-12-02 ENCOUNTER — Encounter: Payer: Self-pay | Admitting: Nurse Practitioner

## 2023-12-02 ENCOUNTER — Telehealth (HOSPITAL_COMMUNITY): Payer: Self-pay

## 2023-12-02 ENCOUNTER — Other Ambulatory Visit: Payer: Self-pay

## 2023-12-02 VITALS — BP 138/70 | HR 79 | Ht 71.0 in | Wt 230.4 lb

## 2023-12-02 DIAGNOSIS — Z79899 Other long term (current) drug therapy: Secondary | ICD-10-CM

## 2023-12-02 DIAGNOSIS — I5032 Chronic diastolic (congestive) heart failure: Secondary | ICD-10-CM

## 2023-12-02 DIAGNOSIS — Z01818 Encounter for other preprocedural examination: Secondary | ICD-10-CM | POA: Diagnosis not present

## 2023-12-02 DIAGNOSIS — E785 Hyperlipidemia, unspecified: Secondary | ICD-10-CM

## 2023-12-02 DIAGNOSIS — I2510 Atherosclerotic heart disease of native coronary artery without angina pectoris: Secondary | ICD-10-CM

## 2023-12-02 DIAGNOSIS — R319 Hematuria, unspecified: Secondary | ICD-10-CM | POA: Diagnosis not present

## 2023-12-02 DIAGNOSIS — R600 Localized edema: Secondary | ICD-10-CM

## 2023-12-02 DIAGNOSIS — I4891 Unspecified atrial fibrillation: Secondary | ICD-10-CM

## 2023-12-02 DIAGNOSIS — I10 Essential (primary) hypertension: Secondary | ICD-10-CM

## 2023-12-02 DIAGNOSIS — Z8679 Personal history of other diseases of the circulatory system: Secondary | ICD-10-CM

## 2023-12-02 MED ORDER — TORSEMIDE 20 MG PO TABS
20.0000 mg | ORAL_TABLET | Freq: Every day | ORAL | 3 refills | Status: AC
  Filled 2023-12-02 (×2): qty 90, 90d supply, fill #0
  Filled 2024-02-09: qty 90, 90d supply, fill #1

## 2023-12-02 NOTE — Telephone Encounter (Signed)
 PA request has been Received. New Encounter has been or will be created for follow up. For additional info see Pharmacy Prior Auth telephone encounter from 12/02/23.

## 2023-12-02 NOTE — Progress Notes (Unsigned)
 Cardiology Office Note   Date:  10/09/2023 ID:  Xavier Caldwell, DOB 1951/01/24, MRN [id number] PCP: Bertell Satterfield, MD  [facility] Providers Cardiologist:  Alvan Carrier, MD     History of Present Illness Xavier Caldwell is a 73 y.o. male with a PMH of CAD, s/p STEMI in 2020 and complicated by VT arrest, hx of chest pain, HTN, HLD, chronic HFpEF, A-fib, carotid artery stenosis, s/p bilateral CEA's (followed by [facility]), dizziness, hx of hyperkalemia/AKI, who presents today for 6 week follow-up.   Previous cardiovascular history of inferior STEMI in 2020 and received drug-eluting stent to RCA, complicated by VT arrest requiring defib in the Cath Lab and by bradycardia, and requiring temporary pacing at the time.  Echo around that time showed LVEF 45 to 50%.  Most recent NST in June 2025 was negative for ischemia.  Last seen by Dr. Alvan on 08/13/2023. Pt noted some ongoing edema, and lasix  was increased to 60 mg in AM, 40 mg in PM, and bloodwork arranged to be checked in 2 weeks.   Today he presents for 6 week follow-up. He states he is doing the same since last office visit.  Leg swelling remains the same.  Taking Lasix  as instructed from last office visit. Denies any chest pain, palpitations, syncope, presyncope, dizziness, orthopnea, PND, significant weight changes, acute bleeding, or claudication.  ROS: Negative.  See HPI.  Studies Reviewed  EKG: No EKG is ordered today.   Carotid duplex 10/2023: Summary:  Right Carotid: There is no evidence of stenosis in the right ICA.   Left Carotid: There is no evidence of stenosis in the left ICA.   Vertebrals:  Bilateral vertebral arteries demonstrate antegrade flow.  Subclavians: Normal flow hemodynamics were seen in bilateral subclavian               arteries.   *See table(s) above for measurements and observations.    Lexiscan  07/2023:   The study is normal. There are no perfusion defects consistent with prior infarct or current  ischemia.  The study is low risk.   No ST deviation was noted.   LV perfusion is normal.   Left ventricular function is normal. Nuclear stress EF: 59%. The left ventricular ejection fraction is normal (55-65%). End diastolic cavity size is normal.   There is a small mild intensity apical defect present in the resting images only with normal wall motion consistent with apical thinning.   Echocardiogram 05/2022: 1. Left ventricular ejection fraction, by estimation, is 55 to 60%. The  left ventricle has normal function. Left ventricular endocardial border  not optimally defined to evaluate regional wall motion. There is moderate  left ventricular hypertrophy. Left  ventricular diastolic parameters are indeterminate.   2. Right ventricular systolic function is normal. The right ventricular  size is normal. Tricuspid regurgitation signal is inadequate for assessing  PA pressure.   3. Left atrial size was severely dilated.   4. Right atrial size was severely dilated.   5. The mitral valve is normal in structure. No evidence of mitral valve  regurgitation. No evidence of mitral stenosis.   6. The aortic valve is tricuspid. Aortic valve regurgitation is not  visualized. No aortic stenosis is present.   7. The pulmonic valve was abnormal.   8. The inferior vena cava is dilated in size with <50% respiratory  variability, suggesting right atrial pressure of 15 mmHg.   ABI's 03/2020:  Summary:  Right: Resting right ankle-brachial index indicates moderate right  lower  extremity arterial disease. The right toe-brachial index is abnormal.   Left: Resting left ankle-brachial index is within normal range. No  evidence of significant left lower extremity arterial disease. The left  toe-brachial index is abnormal.    *See table(s) above for measurements and observations.    LHC 04/2018:  Prox RCA lesion is 100% stenosed. Ost LAD to Prox LAD lesion is 30% stenosed. Mid LAD lesion is 40%  stenosed. Ost 2nd Mrg to 2nd Mrg lesion is 40% stenosed. A drug-eluting stent was successfully placed using a STENT RESOLUTE ONYX 3.5X30. Post intervention, there is a 0% residual stenosis.   1. 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 Ventricular fibrillation/cardiac arrest with successful resuscitation 3. Successful PTCA/DES x 1 proximal RCA.  4. Successful aspiration thrombectomy distal RCA 5. Mild to moderate non-obstructive disease in the Circumflex and LAD   Recommendations: Will admit to ICU. Echo in the am. Will continue Aggrastat  for 18 hours. Will continue ASA/Brilinta  and statin. Will not start a beta blocker today given hypotension and bradycardia. Ace inh on hold with hypotension.    Physical Exam VS:  There were no vitals taken for this visit.       Wt Readings from Last 3 Encounters:  10/23/23 225 lb 9.6 oz (102.3 kg)  10/09/23 232 lb 3.2 oz (105.3 kg)  10/07/23 229 lb (103.9 kg)    GEN: Well nourished, well developed in no acute distress NECK: No JVD; No carotid bruits CARDIAC: S1/S2, RRR, no murmurs, rubs, gallops RESPIRATORY:  Clear to auscultation without rales, wheezing or rhonchi  ABDOMEN: Soft, non-tender, non-distended EXTREMITIES:  Nonpitting edema to BLE; No deformity   ASSESSMENT AND PLAN  Chronic HFpEF, leg edema, medication management Stage C, NYHA class I-II symptoms.  EF normal at 55 to 60% in April 2024.  Does show some continued leg edema.  Will stop Lasix  and switch to torsemide  20 mg twice daily and obtain BMET and magnesium  in 1 to 2 weeks.  He will update us  in 1 week regarding his swelling and weight trends.  Will also start potassium 20 mill equivalents daily.  Continue rest of medication regimen. Low sodium diet, fluid restriction <2L, and daily weights encouraged. Educated to contact our office for weight gain of 2 lbs overnight or 5 lbs in one week.  CAD, s/p STEMI, complicated by VT arrest and  bradycardia Stable with no anginal symptoms. No indication for ischemic evaluation.  No medication changes at this time besides what is noted above. Heart healthy diet and regular cardiovascular exercise encouraged.   A-fib Denies any tachycardia or palpitations.  Heart rate is well-controlled today.  Continue current medication regimen.  Eliquis  was switched to Pradaxa  due to cost.  Denies any bleeding issues and doing well on current medication regimen.  No medication changes at this time.  4. HTN Blood pressure stable and at goal. Discussed to monitor BP at home at least 2 hours after medications and sitting for 5-10 minutes.  No medication changes at this time. Heart healthy diet and regular cardiovascular exercise encouraged.   5. HLD LDL 46 in January 2025, mildly elevated triglyceride counts.  Recommended heart healthy diet, Mediterranean diet.  Continue current medication regimen.  Continue follow-up with PCP.  6.  Carotid artery stenosis, s/p bilateral CEA's Most recent carotid duplex revealed no stenosis along bilateral ICAs.  Continue follow-up with [facility].   Dispo: Follow-up with MD/APP in 6 weeks or sooner if anything changes.  Signed, Almarie Crate, NP

## 2023-12-02 NOTE — Patient Instructions (Addendum)
 Medication Instructions:  Your physician has recommended you make the following change in your medication:  Decrease Torsemide  20 mg to once daily Continue taking all other medications as prescribed  Labwork: CBC and BMET in 2 weeks at [facility]  Testing/Procedures: None  Follow-Up: Your physician recommends that you schedule a follow-up appointment in: 2-3 months  Any Other Special Instructions Will Be Listed Below (If Applicable). Thank you for choosing [facility]!     If you need a refill on your cardiac medications before your next appointment, please call your pharmacy.

## 2023-12-03 ENCOUNTER — Telehealth (INDEPENDENT_AMBULATORY_CARE_PROVIDER_SITE_OTHER): Payer: Self-pay | Admitting: Gastroenterology

## 2023-12-03 ENCOUNTER — Telehealth (INDEPENDENT_AMBULATORY_CARE_PROVIDER_SITE_OTHER): Payer: Self-pay

## 2023-12-03 ENCOUNTER — Ambulatory Visit: Admitting: Gastroenterology

## 2023-12-03 ENCOUNTER — Encounter: Payer: Self-pay | Admitting: Gastroenterology

## 2023-12-03 VITALS — BP 101/59 | HR 73 | Temp 97.1°F | Ht 71.0 in | Wt 235.9 lb

## 2023-12-03 DIAGNOSIS — Z860100 Personal history of colon polyps, unspecified: Secondary | ICD-10-CM | POA: Diagnosis not present

## 2023-12-03 DIAGNOSIS — K5900 Constipation, unspecified: Secondary | ICD-10-CM

## 2023-12-03 MED ORDER — PEG 3350-KCL-NA BICARB-NACL 420 G PO SOLR: 4000.0000 mL | Freq: Once | ORAL | 0 refills | Status: AC

## 2023-12-03 MED ORDER — LINACLOTIDE 145 MCG PO CAPS: 145.0000 ug | ORAL_CAPSULE | Freq: Every day | ORAL | Status: AC

## 2023-12-03 NOTE — Telephone Encounter (Signed)
    12/03/23  Xavier Caldwell [place] 12/13/1950  What type of surgery is being performed? Colonoscopy   When is surgery scheduled? 12/10/2023  What type of clearance is required (medical or pharmacy to hold medication or both? medication  Are there any medications that need to be held prior to surgery and how long? Pradaxa , hold 2 days prior  Name of physician performing surgery?  Dr. Shaaron Rouse [facility] at [facility] Phone: [phone] Fax: [phone]  Anethesia type (none, local, MAC, general)? MAC

## 2023-12-03 NOTE — Patient Instructions (Signed)
 We are arranging a colonoscopy with Dr. Shaaron in the near future.   You will hold Pradaxa  48 hours prior.  I would like for you to try Linzess  again. Linzess  works best when taken once a day every day, on an empty stomach, at least 30 minutes before your first meal of the day.  When Linzess  is taken daily as directed:  *Constipation relief is typically felt in about a week *IBS-C patients may begin to experience relief from belly pain and overall abdominal symptoms (pain, discomfort, and bloating) in about 1 week,   with symptoms typically improving over 12 weeks.  Diarrhea may occur in the first 2 weeks -keep taking it.  The diarrhea should go away and you should start having normal, complete, full bowel movements. It may be helpful to start treatment when you can be near the comfort of your own bathroom, such as a weekend.   Let me know how you do with this, and we can adjust the dosage up or down if needed!  We will see you in 3 months!   I enjoyed seeing you again today! I value our relationship and want to provide genuine, compassionate, and quality care. You may receive a survey regarding your visit with me, and I welcome your feedback! Thanks so much for taking the time to complete this. I look forward to seeing you again.      Therisa MICAEL Stager, PhD, ANP-BC [facility]

## 2023-12-03 NOTE — H&P (View-Only) (Signed)
 Gastroenterology Office Note     Primary Care Physician:  Bevely Doffing, FNP  Primary Gastroenterologist: Dr. Shaaron    Chief Complaint   Chief Complaint  Patient presents with   Colonoscopy    Pt arrives to schedule colonoscopy. Last TCS by Dr.Rourk in 2021. Pt does report constipation. Pt is taking a colon cleanser. Pt prefers Mondays if possible and would like TCS done ASAP.      History of Present Illness   Xavier Caldwell is a 73 y.o. male presenting today with a history of chronic constipation, multiple adenomas in the past and overdue for surveillance, last seen in 2022. He is with his wife today. 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 Last colonoscopy 2021 with 3-year-surveillance recommended. Additional history includes CHF, afib, CKD, CAD, DM, HLD, HTN, MI in 2020, PAD, prior stroke, actually just seen by cardiology 10/28 and doing well. Last EF 55-60%, normal stress test June 2025. He is on Pradaxa , previously on Eliquis .   Dealing with chronic constipation. Colon cleanse every other day. He liked linzess  in the past. Has to strain at times.   No rectal bleeding. No abdominal pain. No weight loss or lack of appetite. No dysphagia. No chronic GERD. No PPI.    No FH colon cancer  Last colonoscopy 2021: ten sessile polyps, 4-9 mm in sized, 11 mm polyp in sigmoid, three sessile polyps in rectum and mid rectum 2-3 mm in size, tubular adenomas and hyperplastic. 3 year surveillance  2019: 9 polyps, multiple tubular adenomas     Past Medical History:  Diagnosis Date   Acute systolic (congestive) heart failure (HCC)    Anxiety    Arthritis    Atrial fibrillation (HCC)    Back pain    Chronic kidney disease    pt told by PA of Dr. Lenis that he had stage 3 kidney so they had me stop taking Metformin    Coronary artery disease    Diabetes mellitus without complication (HCC)    High cholesterol    History of gout     Hypertension    Myocardial infarction (HCC) 04/2018   Neuropathy    PAD (peripheral artery disease)    Retinal detachment    One on the right and two on the left   Sleep apnea    Stop Bang score of 4   STEMI (ST elevation myocardial infarction) (HCC)    04/08/2018 PCI/DES RCA   Stroke [facility])    February 2022   VT (ventricular tachycardia) [facility])     Past Surgical History:  Procedure Laterality Date   APPENDECTOMY     CARDIOVERSION N/A 04/13/2018   Procedure: CARDIOVERSION;  Surgeon: Raford Riggs, MD;  Location: [facility] ENDOSCOPY;  Service: Cardiovascular;  Laterality: N/A;   CATARACT EXTRACTION W/PHACO Right 04/25/2014   Procedure: CATARACT EXTRACTION PHACO AND INTRAOCULAR LENS PLACEMENT (IOC);  Surgeon: Dow JULIANNA Burke, MD;  Location: [facility] ORS;  Service: Ophthalmology;  Laterality: Right;  CDE:4.70   CATARACT EXTRACTION W/PHACO Left 07/11/2014   Procedure: CATARACT EXTRACTION PHACO AND INTRAOCULAR LENS PLACEMENT LEFT EYE CDE=8.32;  Surgeon: Dow JULIANNA Burke, MD;  Location: [facility] ORS;  Service: Ophthalmology;  Laterality: Left;   COLONOSCOPY WITH PROPOFOL  N/A 09/23/2016   Rourk: 9 polyps removed, multiple tubular adenomas. recommended 3 yr surveillance   COLONOSCOPY WITH PROPOFOL  N/A 12/27/2019   ten sessile polyps, 4-9 mm in size in descending, hepatic flexure, and cecum, 11 mm  polyp in sigmoid, three sessile polyps in rectum and mid rectum 2-3 mm in size. Tubular adenomas and hyperplastic. 3 year surveillance   CORONARY/GRAFT ACUTE MI REVASCULARIZATION N/A 04/07/2018   Procedure: Coronary/Graft Acute MI Revascularization;  Surgeon: Verlin Lonni BIRCH, MD;  Location: [facility] INVASIVE CV LAB;  Service: Cardiovascular;  Laterality: N/A;   ENDARTERECTOMY Right 03/29/2020   Procedure: RIGHT CAROTID ARTERY ENDARTERECTOMY WITH PATCH ANGIOPLASTY;  Surgeon: Gretta Lonni PARAS, MD;  Location: [facility] OR;  Service: Vascular;  Laterality: Right;   ENDARTERECTOMY Left 09/11/2020   Procedure:  ENDARTERECTOMY CAROTID LEFT;  Surgeon: Gretta Lonni PARAS, MD;  Location: [facility] OR;  Service: Vascular;  Laterality: Left;   LEFT HEART CATH AND CORONARY ANGIOGRAPHY N/A 04/07/2018   Procedure: LEFT HEART CATH AND CORONARY ANGIOGRAPHY;  Surgeon: Verlin Lonni BIRCH, MD;  Location: [facility] INVASIVE CV LAB;  Service: Cardiovascular;  Laterality: N/A;   MELANOMA EXCISION  07/2019   PATCH ANGIOPLASTY Left 09/11/2020   Procedure: PATCH ANGIOPLASTY;  Surgeon: Gretta Lonni PARAS, MD;  Location: [facility] OR;  Service: Vascular;  Laterality: Left;   POLYPECTOMY  09/23/2016   Procedure: POLYPECTOMY;  Surgeon: Shaaron Lamar HERO, MD;  Location: [facility] ENDO SU[facility];  Service: Endoscopy;;  colon   POLYPECTOMY  12/27/2019   Procedure: POLYPECTOMY;  Surgeon: Shaaron Lamar HERO, MD;  Location: [facility] ENDO [facility];  Service: Endoscopy;;   RETINAL DETACHMENT SURGERY      Current Outpatient Medications  Medication Sig Dispense Refill   ALPRAZolam  (XANAX ) 0.5 MG tablet Take 0.5 mg by mouth 2 (two) times daily as needed for anxiety.     amLODipine  (NORVASC ) 10 MG tablet Take 1 tablet (10 mg total) by mouth daily. 30 tablet 1   atorvastatin  (LIPITOR ) 80 MG tablet TAKE ONE TABLET BY MOUTH EVERYDAY AT BEDTIME 90 tablet 2   carvedilol  (COREG ) 12.5 MG tablet Take 1 tablet (12.5 mg total) by mouth 2 (two) times daily. 180 tablet 3   Continuous Glucose Sensor (DEXCOM G7 SENSOR) MISC Change sensor every 10 days. Dx E11.65 9 each 3   dabigatran  (PRADAXA ) 150 MG CAPS capsule Take 1 capsule (150 mg total) by mouth 2 (two) times daily. 60 capsule 5   DULoxetine  (CYMBALTA ) 60 MG capsule Take 60 mg by mouth daily with lunch.      ezetimibe  (ZETIA ) 10 MG tablet Take 1 tablet (10 mg total) by mouth daily. 30 tablet 5   gabapentin  (NEURONTIN ) 300 MG capsule Take 300 mg by mouth 2 (two) times daily.      glimepiride  (AMARYL ) 4 MG tablet Take 0.5 tablets (2 mg total) by mouth daily.     hydrALAZINE  (APRESOLINE ) 50 MG tablet TAKE 1 TABLET BY MOUTH 3 TIMES  DAILY 90 tablet 10   insulin  degludec (TRESIBA  FLEXTOUCH) 100 UNIT/ML FlexTouch Pen Inject 40 Units into the skin at bedtime. 36 mL 3   metFORMIN  (GLUCOPHAGE ) 500 MG tablet Take 1 tablet (500 mg total) by mouth 2 (two) times daily with a meal. 180 tablet 3   Misc Natural Products (COLON CLEANSE PO) Take by mouth.     nitroGLYCERIN  (NITROSTAT ) 0.4 MG SL tablet Place 1 tablet (0.4 mg total) under the tongue every 5 (five) minutes as needed (if no relief after 3rd dose proceed to ER or call 911). 25 tablet 2   potassium chloride  SA (KLOR-CON  M) 20 MEQ tablet Take 1 tablet (20 mEq total) by mouth daily. 90 tablet 0   rOPINIRole  (REQUIP ) 1 MG tablet Take 1 mg by mouth 4 (four) times  daily.      timolol  (TIMOPTIC ) 0.5 % ophthalmic solution INSTILL 1 DROP INTO EACH EYE TWICE DAILY (Patient taking differently: Place 1 drop into both eyes 2 (two) times daily. 1 drop both eyes BID) 10 mL 10   torsemide  (DEMADEX ) 20 MG tablet Take 1 tablet (20 mg total) by mouth daily. 90 tablet 3   UNABLE TO FIND Med Name: Super Vitamin B complex one daily     UNIFINE PENTIPS 31G X 5 MM MISC USE TO INJECT INSULIN  AT BEDTIME 100 each 5   No current facility-administered medications for this visit.    Allergies as of 12/03/2023   (No Known Allergies)    Family History  Problem Relation Age of Onset   Hypertension Father    Colon cancer Neg Hx    Diabetes Neg Hx     Social History   Socioeconomic History   Marital status: Married    Spouse name: Not on file   Number of children: Not on file   Years of education: Not on file   Highest education level: Not on file  Occupational History   Not on file  Tobacco Use   Smoking status: Former    Current packs/day: 0.00    Average packs/day: 1.5 packs/day for 30.0 years (45.0 ttl pk-yrs)    Types: Cigarettes    Start date: 07/06/1982    Quit date: 07/05/2012    Years since quitting: 11.4    Passive exposure: Never   Smokeless tobacco: Never  Vaping Use   Vaping  status: Never Used  Substance and Sexual Activity   Alcohol use: No   Drug use: No   Sexual activity: Not Currently    Birth control/protection: None  Other Topics Concern   Not on file  Social History Narrative   Not on file   Social Drivers of Health   Financial Resource Strain: Low Risk  (06/06/2022)   Overall Financial Resource Strain (CARDIA)    Difficulty of Paying Living Expenses: Not hard at all  Food Insecurity: No Food Insecurity (09/20/2022)   Hunger Vital Sign    Worried About Running Out of Food in the Last Year: Never true    Ran Out of Food in the Last Year: Never true  Transportation Needs: No Transportation Needs (09/20/2022)   PRAPARE - Administrator, Civil Service (Medical): No    Lack of Transportation (Non-Medical): No  Physical Activity: Sufficiently Active (09/20/2022)   Exercise Vital Sign    Days of Exercise per Week: 5 days    Minutes of Exercise per Session: 60 min  Stress: No Stress Concern Present (06/26/2022)   [employer] of Occupational Health - Occupational Stress Questionnaire    Feeling of Stress : Only a little  Social Connections: Socially Integrated (12/07/2021)   Social Connection and Isolation Panel    Frequency of Communication with Friends and Family: More than three times a week    Frequency of Social Gatherings with Friends and Family: More than three times a week    Attends Religious Services: More than 4 times per year    Active Member of [employer] or Organizations: Yes    Attends Engineer, Structural: More than 4 times per year    Marital Status: Married  Catering Manager Violence: Not on file     Review of Systems   Gen: Denies any fever, chills, fatigue, weight loss, lack of appetite.  CV: Denies chest pain, heart palpitations, peripheral edema, syncope.  Resp: Denies shortness of breath at rest or with exertion. Denies wheezing or cough.  GI: Denies dysphagia or odynophagia. Denies jaundice, hematemesis,  fecal incontinence. GU : Denies urinary burning, urinary frequency, urinary hesitancy MS: Denies joint pain, muscle weakness, cramps, or limitation of movement.  Derm: Denies rash, itching, dry skin Psych: Denies depression, anxiety, memory loss, and confusion Heme: Denies bruising, bleeding, and enlarged lymph nodes.   Physical Exam   BP (!) 101/59   Pulse 73   Temp (!) 97.1 F (36.2 C)   Ht 5' 11 (1.803 m)   Wt 235 lb 14.4 oz (107 kg)   BMI 32.90 kg/m  General:   Alert and oriented. Pleasant and cooperative. Well-nourished and well-developed.  Head:  Normocephalic and atraumatic. Eyes:  Without icterus Cardiac: S1 S2 without obvious murmurs Lungs: clear bilatrally Abdomen:  +BS, soft, larger AP diameter and difficult to assess for HSM, no rebound or guarding Rectal:  Deferred  Msk:  Symmetrical without gross deformities. Normal posture. Extremities:  With 1-2 +lower etremity edema Neurologic:  Alert and  oriented x4;  grossly normal neurologically. Skin:  Intact without significant lesions or rashes. Psych:  Alert and cooperative. Normal mood and affect.   Assessment   Xavier Caldwell is a 73 y.o. male presenting today with a history of chronic constipation, multiple adenomas in the past and overdue for surveillance, last seen in 2022. Additional pertinent history includes CHF, afib, CKD, CAD, DM, HLD, HTN, MI in 2020, PAD, prior stroke 2022, actually just seen by cardiology 10/28 and doing well. Last EF 55-60%, normal stress test June 2025. He is on Pradaxa , previously on Eliquis .   Constipation has been chronic and addressed during prior visits. We will trial again Linzess , as this worked well for him in the past.   Hx of multiple polyps: will arrange surveillance now. Will also have modified prep to ensure he has adequate clean out. 3 year surveillance due now.   Requesting to hold Pradaxa  X 48 hours prior: will discuss with cardiology.     PLAN    Linzess  145 mcg  daily: send progress report Proceed with colonoscopy by Dr. Shaaron in near future: the risks, benefits, and alternatives have been discussed with the patient in detail. The patient states understanding and desires to proceed. Extra half day of clears Hold Pradaxa  X 48 hours prior 3 month follow-up   Therisa MICAEL Stager, PhD, ANP-BC [facility] Gastroenterology

## 2023-12-03 NOTE — Progress Notes (Signed)
 Gastroenterology Office Note     Primary Care Physician:  Bevely Doffing, FNP  Primary Gastroenterologist: Dr. Shaaron    Chief Complaint   Chief Complaint  Patient presents with   Colonoscopy    Pt arrives to schedule colonoscopy. Last TCS by Dr.Rourk in 2021. Pt does report constipation. Pt is taking a colon cleanser. Pt prefers Mondays if possible and would like TCS done ASAP.      History of Present Illness   Xavier Caldwell is a 73 y.o. male presenting today with a history of chronic constipation, multiple adenomas in the past and overdue for surveillance, last seen in 2022. He is with his wife today. 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 Last colonoscopy 2021 with 3-year-surveillance recommended. Additional history includes CHF, afib, CKD, CAD, DM, HLD, HTN, MI in 2020, PAD, prior stroke, actually just seen by cardiology 10/28 and doing well. Last EF 55-60%, normal stress test June 2025. He is on Pradaxa , previously on Eliquis .   Dealing with chronic constipation. Colon cleanse every other day. He liked linzess  in the past. Has to strain at times.   No rectal bleeding. No abdominal pain. No weight loss or lack of appetite. No dysphagia. No chronic GERD. No PPI.    No FH colon cancer  Last colonoscopy 2021: ten sessile polyps, 4-9 mm in sized, 11 mm polyp in sigmoid, three sessile polyps in rectum and mid rectum 2-3 mm in size, tubular adenomas and hyperplastic. 3 year surveillance  2019: 9 polyps, multiple tubular adenomas     Past Medical History:  Diagnosis Date   Acute systolic (congestive) heart failure (HCC)    Anxiety    Arthritis    Atrial fibrillation (HCC)    Back pain    Chronic kidney disease    pt told by PA of Dr. Lenis that he had stage 3 kidney so they had me stop taking Metformin    Coronary artery disease    Diabetes mellitus without complication (HCC)    High cholesterol    History of gout     Hypertension    Myocardial infarction (HCC) 04/2018   Neuropathy    PAD (peripheral artery disease)    Retinal detachment    One on the right and two on the left   Sleep apnea    Stop Bang score of 4   STEMI (ST elevation myocardial infarction) (HCC)    04/08/2018 PCI/DES RCA   Stroke [facility])    February 2022   VT (ventricular tachycardia) [facility])     Past Surgical History:  Procedure Laterality Date   APPENDECTOMY     CARDIOVERSION N/A 04/13/2018   Procedure: CARDIOVERSION;  Surgeon: Raford Riggs, MD;  Location: [facility] ENDOSCOPY;  Service: Cardiovascular;  Laterality: N/A;   CATARACT EXTRACTION W/PHACO Right 04/25/2014   Procedure: CATARACT EXTRACTION PHACO AND INTRAOCULAR LENS PLACEMENT (IOC);  Surgeon: Dow JULIANNA Burke, MD;  Location: [facility] ORS;  Service: Ophthalmology;  Laterality: Right;  CDE:4.70   CATARACT EXTRACTION W/PHACO Left 07/11/2014   Procedure: CATARACT EXTRACTION PHACO AND INTRAOCULAR LENS PLACEMENT LEFT EYE CDE=8.32;  Surgeon: Dow JULIANNA Burke, MD;  Location: [facility] ORS;  Service: Ophthalmology;  Laterality: Left;   COLONOSCOPY WITH PROPOFOL  N/A 09/23/2016   Rourk: 9 polyps removed, multiple tubular adenomas. recommended 3 yr surveillance   COLONOSCOPY WITH PROPOFOL  N/A 12/27/2019   ten sessile polyps, 4-9 mm in size in descending, hepatic flexure, and cecum, 11 mm  polyp in sigmoid, three sessile polyps in rectum and mid rectum 2-3 mm in size. Tubular adenomas and hyperplastic. 3 year surveillance   CORONARY/GRAFT ACUTE MI REVASCULARIZATION N/A 04/07/2018   Procedure: Coronary/Graft Acute MI Revascularization;  Surgeon: Verlin Lonni BIRCH, MD;  Location: [facility] INVASIVE CV LAB;  Service: Cardiovascular;  Laterality: N/A;   ENDARTERECTOMY Right 03/29/2020   Procedure: RIGHT CAROTID ARTERY ENDARTERECTOMY WITH PATCH ANGIOPLASTY;  Surgeon: Gretta Lonni PARAS, MD;  Location: [facility] OR;  Service: Vascular;  Laterality: Right;   ENDARTERECTOMY Left 09/11/2020   Procedure:  ENDARTERECTOMY CAROTID LEFT;  Surgeon: Gretta Lonni PARAS, MD;  Location: [facility] OR;  Service: Vascular;  Laterality: Left;   LEFT HEART CATH AND CORONARY ANGIOGRAPHY N/A 04/07/2018   Procedure: LEFT HEART CATH AND CORONARY ANGIOGRAPHY;  Surgeon: Verlin Lonni BIRCH, MD;  Location: [facility] INVASIVE CV LAB;  Service: Cardiovascular;  Laterality: N/A;   MELANOMA EXCISION  07/2019   PATCH ANGIOPLASTY Left 09/11/2020   Procedure: PATCH ANGIOPLASTY;  Surgeon: Gretta Lonni PARAS, MD;  Location: [facility] OR;  Service: Vascular;  Laterality: Left;   POLYPECTOMY  09/23/2016   Procedure: POLYPECTOMY;  Surgeon: Shaaron Lamar HERO, MD;  Location: [facility] ENDO SU[facility];  Service: Endoscopy;;  colon   POLYPECTOMY  12/27/2019   Procedure: POLYPECTOMY;  Surgeon: Shaaron Lamar HERO, MD;  Location: [facility] ENDO [facility];  Service: Endoscopy;;   RETINAL DETACHMENT SURGERY      Current Outpatient Medications  Medication Sig Dispense Refill   ALPRAZolam  (XANAX ) 0.5 MG tablet Take 0.5 mg by mouth 2 (two) times daily as needed for anxiety.     amLODipine  (NORVASC ) 10 MG tablet Take 1 tablet (10 mg total) by mouth daily. 30 tablet 1   atorvastatin  (LIPITOR ) 80 MG tablet TAKE ONE TABLET BY MOUTH EVERYDAY AT BEDTIME 90 tablet 2   carvedilol  (COREG ) 12.5 MG tablet Take 1 tablet (12.5 mg total) by mouth 2 (two) times daily. 180 tablet 3   Continuous Glucose Sensor (DEXCOM G7 SENSOR) MISC Change sensor every 10 days. Dx E11.65 9 each 3   dabigatran  (PRADAXA ) 150 MG CAPS capsule Take 1 capsule (150 mg total) by mouth 2 (two) times daily. 60 capsule 5   DULoxetine  (CYMBALTA ) 60 MG capsule Take 60 mg by mouth daily with lunch.      ezetimibe  (ZETIA ) 10 MG tablet Take 1 tablet (10 mg total) by mouth daily. 30 tablet 5   gabapentin  (NEURONTIN ) 300 MG capsule Take 300 mg by mouth 2 (two) times daily.      glimepiride  (AMARYL ) 4 MG tablet Take 0.5 tablets (2 mg total) by mouth daily.     hydrALAZINE  (APRESOLINE ) 50 MG tablet TAKE 1 TABLET BY MOUTH 3 TIMES  DAILY 90 tablet 10   insulin  degludec (TRESIBA  FLEXTOUCH) 100 UNIT/ML FlexTouch Pen Inject 40 Units into the skin at bedtime. 36 mL 3   metFORMIN  (GLUCOPHAGE ) 500 MG tablet Take 1 tablet (500 mg total) by mouth 2 (two) times daily with a meal. 180 tablet 3   Misc Natural Products (COLON CLEANSE PO) Take by mouth.     nitroGLYCERIN  (NITROSTAT ) 0.4 MG SL tablet Place 1 tablet (0.4 mg total) under the tongue every 5 (five) minutes as needed (if no relief after 3rd dose proceed to ER or call 911). 25 tablet 2   potassium chloride  SA (KLOR-CON  M) 20 MEQ tablet Take 1 tablet (20 mEq total) by mouth daily. 90 tablet 0   rOPINIRole  (REQUIP ) 1 MG tablet Take 1 mg by mouth 4 (four) times  daily.      timolol  (TIMOPTIC ) 0.5 % ophthalmic solution INSTILL 1 DROP INTO EACH EYE TWICE DAILY (Patient taking differently: Place 1 drop into both eyes 2 (two) times daily. 1 drop both eyes BID) 10 mL 10   torsemide  (DEMADEX ) 20 MG tablet Take 1 tablet (20 mg total) by mouth daily. 90 tablet 3   UNABLE TO FIND Med Name: Super Vitamin B complex one daily     UNIFINE PENTIPS 31G X 5 MM MISC USE TO INJECT INSULIN  AT BEDTIME 100 each 5   No current facility-administered medications for this visit.    Allergies as of 12/03/2023   (No Known Allergies)    Family History  Problem Relation Age of Onset   Hypertension Father    Colon cancer Neg Hx    Diabetes Neg Hx     Social History   Socioeconomic History   Marital status: Married    Spouse name: Not on file   Number of children: Not on file   Years of education: Not on file   Highest education level: Not on file  Occupational History   Not on file  Tobacco Use   Smoking status: Former    Current packs/day: 0.00    Average packs/day: 1.5 packs/day for 30.0 years (45.0 ttl pk-yrs)    Types: Cigarettes    Start date: 07/06/1982    Quit date: 07/05/2012    Years since quitting: 11.4    Passive exposure: Never   Smokeless tobacco: Never  Vaping Use   Vaping  status: Never Used  Substance and Sexual Activity   Alcohol use: No   Drug use: No   Sexual activity: Not Currently    Birth control/protection: None  Other Topics Concern   Not on file  Social History Narrative   Not on file   Social Drivers of Health   Financial Resource Strain: Low Risk  (06/06/2022)   Overall Financial Resource Strain (CARDIA)    Difficulty of Paying Living Expenses: Not hard at all  Food Insecurity: No Food Insecurity (09/20/2022)   Hunger Vital Sign    Worried About Running Out of Food in the Last Year: Never true    Ran Out of Food in the Last Year: Never true  Transportation Needs: No Transportation Needs (09/20/2022)   PRAPARE - Administrator, Civil Service (Medical): No    Lack of Transportation (Non-Medical): No  Physical Activity: Sufficiently Active (09/20/2022)   Exercise Vital Sign    Days of Exercise per Week: 5 days    Minutes of Exercise per Session: 60 min  Stress: No Stress Concern Present (06/26/2022)   [employer] of Occupational Health - Occupational Stress Questionnaire    Feeling of Stress : Only a little  Social Connections: Socially Integrated (12/07/2021)   Social Connection and Isolation Panel    Frequency of Communication with Friends and Family: More than three times a week    Frequency of Social Gatherings with Friends and Family: More than three times a week    Attends Religious Services: More than 4 times per year    Active Member of [employer] or Organizations: Yes    Attends Engineer, Structural: More than 4 times per year    Marital Status: Married  Catering Manager Violence: Not on file     Review of Systems   Gen: Denies any fever, chills, fatigue, weight loss, lack of appetite.  CV: Denies chest pain, heart palpitations, peripheral edema, syncope.  Resp: Denies shortness of breath at rest or with exertion. Denies wheezing or cough.  GI: Denies dysphagia or odynophagia. Denies jaundice, hematemesis,  fecal incontinence. GU : Denies urinary burning, urinary frequency, urinary hesitancy MS: Denies joint pain, muscle weakness, cramps, or limitation of movement.  Derm: Denies rash, itching, dry skin Psych: Denies depression, anxiety, memory loss, and confusion Heme: Denies bruising, bleeding, and enlarged lymph nodes.   Physical Exam   BP (!) 101/59   Pulse 73   Temp (!) 97.1 F (36.2 C)   Ht 5' 11 (1.803 m)   Wt 235 lb 14.4 oz (107 kg)   BMI 32.90 kg/m  General:   Alert and oriented. Pleasant and cooperative. Well-nourished and well-developed.  Head:  Normocephalic and atraumatic. Eyes:  Without icterus Cardiac: S1 S2 without obvious murmurs Lungs: clear bilatrally Abdomen:  +BS, soft, larger AP diameter and difficult to assess for HSM, no rebound or guarding Rectal:  Deferred  Msk:  Symmetrical without gross deformities. Normal posture. Extremities:  With 1-2 +lower etremity edema Neurologic:  Alert and  oriented x4;  grossly normal neurologically. Skin:  Intact without significant lesions or rashes. Psych:  Alert and cooperative. Normal mood and affect.   Assessment   RAJA LISKA is a 73 y.o. male presenting today with a history of chronic constipation, multiple adenomas in the past and overdue for surveillance, last seen in 2022. Additional pertinent history includes CHF, afib, CKD, CAD, DM, HLD, HTN, MI in 2020, PAD, prior stroke 2022, actually just seen by cardiology 10/28 and doing well. Last EF 55-60%, normal stress test June 2025. He is on Pradaxa , previously on Eliquis .   Constipation has been chronic and addressed during prior visits. We will trial again Linzess , as this worked well for him in the past.   Hx of multiple polyps: will arrange surveillance now. Will also have modified prep to ensure he has adequate clean out. 3 year surveillance due now.   Requesting to hold Pradaxa  X 48 hours prior: will discuss with cardiology.     PLAN    Linzess  145 mcg  daily: send progress report Proceed with colonoscopy by Dr. Shaaron in near future: the risks, benefits, and alternatives have been discussed with the patient in detail. The patient states understanding and desires to proceed. Extra half day of clears Hold Pradaxa  X 48 hours prior 3 month follow-up   Xavier MICAEL Stager, PhD, ANP-BC [facility] G[facility]enterology

## 2023-12-03 NOTE — Telephone Encounter (Signed)
 PA on [facility] for TCS: Notification or Prior Authorization is not required for the requested services You are not required to submit a notification/prior authorization based on the information provided.  Decision ID #: [id number]

## 2023-12-03 NOTE — Telephone Encounter (Signed)
 Medication Samples have been provided to the patient.  Drug name: Linzess        Strength: 145 mcg        Qty: 2 boxes   LOT: [id number]  Exp.Date: 3/26  Dosing instructions: take one po daily  The patient has been instructed regarding the correct time, dose, and frequency of taking this medication, including desired effects and most common side effects.   Xavier Caldwell 9:59 AM 12/03/2023

## 2023-12-03 NOTE — Telephone Encounter (Signed)
 Spoke with patient in the office, scheduled TCS for 12/10/2023 at 1pm. Rx sent to pharmacy. Instructions given to patient in the office.

## 2023-12-04 ENCOUNTER — Encounter (HOSPITAL_COMMUNITY)
Admission: RE | Admit: 2023-12-04 | Discharge: 2023-12-04 | Disposition: A | Discharge status: Home / Self Care | Source: Ambulatory Visit | Attending: Internal Medicine | Admitting: Internal Medicine

## 2023-12-04 ENCOUNTER — Encounter (HOSPITAL_COMMUNITY): Payer: Self-pay

## 2023-12-04 ENCOUNTER — Other Ambulatory Visit: Payer: Self-pay

## 2023-12-04 ENCOUNTER — Other Ambulatory Visit (HOSPITAL_COMMUNITY): Payer: Self-pay

## 2023-12-04 ENCOUNTER — Other Ambulatory Visit: Payer: Self-pay | Admitting: Cardiology

## 2023-12-04 MED ORDER — DABIGATRAN ETEXILATE MESYLATE 150 MG PO CAPS
150.0000 mg | ORAL_CAPSULE | Freq: Two times a day (BID) | ORAL | 1 refills | Status: DC
  Filled 2023-12-04: qty 60, 30d supply, fill #0

## 2023-12-04 NOTE — Telephone Encounter (Signed)
 We don't refill Pradaxa . Please address

## 2023-12-04 NOTE — Telephone Encounter (Signed)
*  STAT* If patient is at the pharmacy, call can be transferred to refill team.   1. Which medications need to be refilled? (please list name of each medication and dose if known)   dabigatran  (PRADAXA ) 150 MG CAPS capsule    2. Which pharmacy/location (including street and city if local pharmacy) is medication to be sent to?  [place] [facility]      3. Do they need a 30 day or 90 day supply? 90 day    Pt is out of medication

## 2023-12-04 NOTE — Telephone Encounter (Signed)
 Prescription refill request for pradaxa  received.  Indication:afib Last office visit:10/25 Weight:107  kg Age:73 Scr:1.16  10/25 CrCl:88.4  ml/min  Prescription refilled

## 2023-12-08 NOTE — Telephone Encounter (Signed)
   Primary Cardiologist: Alvan Carrier, MD  Chart reviewed as part of pre-operative protocol coverage. Given past medical history and time since last visit, based on ACC/AHA guidelines, GIFFORD BALLON would be at acceptable risk for the planned procedure without further cardiovascular testing.   Patient should contact our office if he is having new symptoms that are concerning from a cardiac perspective to arrange a follow-up appointment.    Per office protocol, he may hold Pradaxa  for 2 days prior to procedure and should resume as soon as hemodynamically stable postoperatively.  I will route this recommendation to the requesting party via Epic fax function and remove from pre-op pool.  Please call with questions.  Rosaline EMERSON Bane, NP-C 12/08/2023, 12:06 PM [address] [place], [place] [address] Office [phone] Fax [phone]

## 2023-12-08 NOTE — Telephone Encounter (Signed)
 Noted, thanks!

## 2023-12-09 ENCOUNTER — Other Ambulatory Visit (HOSPITAL_COMMUNITY): Payer: Self-pay

## 2023-12-09 ENCOUNTER — Other Ambulatory Visit: Payer: Self-pay | Admitting: Cardiology

## 2023-12-09 DIAGNOSIS — I4891 Unspecified atrial fibrillation: Secondary | ICD-10-CM

## 2023-12-09 MED ORDER — DABIGATRAN ETEXILATE MESYLATE 150 MG PO CAPS
150.0000 mg | ORAL_CAPSULE | Freq: Two times a day (BID) | ORAL | 1 refills | Status: DC
  Filled 2023-12-09: qty 60, 30d supply, fill #0

## 2023-12-09 NOTE — Telephone Encounter (Signed)
*  STAT* If patient is at the pharmacy, call can be transferred to refill team.   1. Which medications need to be refilled? (please list name of each medication and dose if known)   dabigatran  (PRADAXA ) 150 MG CAPS capsule    2. Which pharmacy/location (including street and city if local pharmacy) is medication to be sent to?  [id number] - [place], [place] - [address]      3. Do they need a 30 day or 90 day supply? 90 day

## 2023-12-10 ENCOUNTER — Encounter (HOSPITAL_COMMUNITY): Payer: Self-pay | Admitting: Internal Medicine

## 2023-12-10 ENCOUNTER — Ambulatory Visit (HOSPITAL_COMMUNITY)
Admission: RE | Admit: 2023-12-10 | Discharge: 2023-12-10 | Disposition: A | Discharge status: Home / Self Care | Attending: Internal Medicine | Admitting: Internal Medicine

## 2023-12-10 ENCOUNTER — Encounter (HOSPITAL_COMMUNITY)
Admission: RE | Disposition: A | Discharge status: Home / Self Care | Payer: Self-pay | Source: Home / Self Care | Attending: Internal Medicine

## 2023-12-10 ENCOUNTER — Other Ambulatory Visit: Payer: Self-pay

## 2023-12-10 ENCOUNTER — Ambulatory Visit (HOSPITAL_COMMUNITY): Admitting: Anesthesiology

## 2023-12-10 DIAGNOSIS — Z860101 Personal history of adenomatous and serrated colon polyps: Secondary | ICD-10-CM

## 2023-12-10 DIAGNOSIS — Z794 Long term (current) use of insulin: Secondary | ICD-10-CM | POA: Insufficient documentation

## 2023-12-10 DIAGNOSIS — Z1211 Encounter for screening for malignant neoplasm of colon: Secondary | ICD-10-CM | POA: Insufficient documentation

## 2023-12-10 DIAGNOSIS — I110 Hypertensive heart disease with heart failure: Secondary | ICD-10-CM

## 2023-12-10 DIAGNOSIS — E1151 Type 2 diabetes mellitus with diabetic peripheral angiopathy without gangrene: Secondary | ICD-10-CM | POA: Insufficient documentation

## 2023-12-10 DIAGNOSIS — I252 Old myocardial infarction: Secondary | ICD-10-CM | POA: Diagnosis not present

## 2023-12-10 DIAGNOSIS — I509 Heart failure, unspecified: Secondary | ICD-10-CM | POA: Diagnosis not present

## 2023-12-10 DIAGNOSIS — K5900 Constipation, unspecified: Secondary | ICD-10-CM

## 2023-12-10 DIAGNOSIS — I2510 Atherosclerotic heart disease of native coronary artery without angina pectoris: Secondary | ICD-10-CM

## 2023-12-10 DIAGNOSIS — F419 Anxiety disorder, unspecified: Secondary | ICD-10-CM | POA: Insufficient documentation

## 2023-12-10 DIAGNOSIS — I25119 Atherosclerotic heart disease of native coronary artery with unspecified angina pectoris: Secondary | ICD-10-CM | POA: Insufficient documentation

## 2023-12-10 DIAGNOSIS — Z8673 Personal history of transient ischemic attack (TIA), and cerebral infarction without residual deficits: Secondary | ICD-10-CM | POA: Diagnosis not present

## 2023-12-10 DIAGNOSIS — E785 Hyperlipidemia, unspecified: Secondary | ICD-10-CM | POA: Insufficient documentation

## 2023-12-10 DIAGNOSIS — G4730 Sleep apnea, unspecified: Secondary | ICD-10-CM | POA: Insufficient documentation

## 2023-12-10 DIAGNOSIS — Z7901 Long term (current) use of anticoagulants: Secondary | ICD-10-CM | POA: Diagnosis not present

## 2023-12-10 DIAGNOSIS — D123 Benign neoplasm of transverse colon: Secondary | ICD-10-CM | POA: Insufficient documentation

## 2023-12-10 DIAGNOSIS — Z87891 Personal history of nicotine dependence: Secondary | ICD-10-CM | POA: Diagnosis not present

## 2023-12-10 DIAGNOSIS — I5022 Chronic systolic (congestive) heart failure: Secondary | ICD-10-CM | POA: Diagnosis not present

## 2023-12-10 DIAGNOSIS — K5909 Other constipation: Secondary | ICD-10-CM | POA: Diagnosis not present

## 2023-12-10 DIAGNOSIS — Z7984 Long term (current) use of oral hypoglycemic drugs: Secondary | ICD-10-CM | POA: Insufficient documentation

## 2023-12-10 DIAGNOSIS — E119 Type 2 diabetes mellitus without complications: Secondary | ICD-10-CM

## 2023-12-10 HISTORY — PX: COLONOSCOPY: SHX5424

## 2023-12-10 LAB — GLUCOSE, CAPILLARY
Glucose-Capillary: 89 mg/dL (ref 70–99)
Glucose-Capillary: 92 mg/dL (ref 70–99)

## 2023-12-10 SURGERY — COLONOSCOPY
Anesthesia: General

## 2023-12-10 MED ORDER — LACTATED RINGERS IV SOLN: INTRAVENOUS | Status: DC

## 2023-12-10 MED ORDER — LIDOCAINE HCL (CARDIAC) PF 100 MG/5ML IV SOSY
PREFILLED_SYRINGE | INTRAVENOUS | Status: DC | PRN
  Administered 2023-12-10: 50 mg via INTRAVENOUS

## 2023-12-10 MED ORDER — PROPOFOL 10 MG/ML IV BOLUS
INTRAVENOUS | Status: DC | PRN
Start: 2023-12-10 — End: 2023-12-10
  Administered 2023-12-10: 100 mg via INTRAVENOUS
  Administered 2023-12-10: 150 ug/kg/min via INTRAVENOUS

## 2023-12-10 NOTE — Discharge Instructions (Signed)
  Colonoscopy Discharge Instructions  Read the instructions outlined below and refer to this sheet in the next few weeks. These discharge instructions provide you with general information on caring for yourself after you leave the hospital. Your doctor may also give you specific instructions. While your treatment has been planned according to the most current medical practices available, unavoidable complications occasionally occur. If you have any problems or questions after discharge, call Dr. Shaaron at [phone]. ACTIVITY You may resume your regular activity, but move at a slower pace for the next 24 hours.  Take frequent rest periods for the next 24 hours.  Walking will help get rid of the air and reduce the bloated feeling in your belly (abdomen).  No driving for 24 hours (because of the medicine (anesthesia) used during the test).   Do not sign any important legal documents or operate any machinery for 24 hours (because of the anesthesia used during the test).  NUTRITION Drink plenty of fluids.  You may resume your normal diet as instructed by your doctor.  Begin with a light meal and progress to your normal diet. Heavy or fried foods are harder to digest and may make you feel sick to your stomach (nauseated).  Avoid alcoholic beverages for 24 hours or as instructed.  MEDICATIONS You may resume your normal medications unless your doctor tells you otherwise.  WHAT YOU CAN EXPECT TODAY Some feelings of bloating in the abdomen.  Passage of more gas than usual.  Spotting of blood in your stool or on the toilet paper.  IF YOU HAD POLYPS REMOVED DURING THE COLONOSCOPY: No aspirin  products for 7 days or as instructed.  No alcohol for 7 days or as instructed.  Eat a soft diet for the next 24 hours.  FINDING OUT THE RESULTS OF YOUR TEST Not all test results are available during your visit. If your test results are not back during the visit, make an appointment with your caregiver to find out the  results. Do not assume everything is normal if you have not heard from your caregiver or the medical facility. It is important for you to follow up on all of your test results.  SEEK IMMEDIATE MEDICAL ATTENTION IF: You have more than a spotting of blood in your stool.  Your belly is swollen (abdominal distention).  You are nauseated or vomiting.  You have a temperature over 101.  You have abdominal pain or discomfort that is severe or gets worse throughout the day.     3 polyps found and removed from your colon today  Further recommendations to follow pending repeat review of pathology report   resume Pradaxa  12/12/2023   at patient request, I called Dorothe Dade at [phone] -  reviewed findings and recommendations

## 2023-12-10 NOTE — Anesthesia Postprocedure Evaluation (Signed)
 Anesthesia Post Note  Patient: Xavier Caldwell  Procedure(s) Performed: COLONOSCOPY  Patient location during evaluation: Phase II Anesthesia Type: General Level of consciousness: awake and alert Pain management: pain level controlled Vital Signs Assessment: post-procedure vital signs reviewed and stable Respiratory status: spontaneous breathing, nonlabored ventilation and respiratory function stable Cardiovascular status: stable Anesthetic complications: no   There were no known notable events for this encounter.   Last Vitals:  Vitals:   12/10/23 1208  BP: 137/68  Pulse: 72  Resp: 20  Temp: (!) 36.3 C  SpO2: 99%    Last Pain:  Vitals:   12/10/23 1322  TempSrc:   PainSc: 0-No pain                 [name] L [name]

## 2023-12-10 NOTE — Interval H&P Note (Signed)
 History and Physical Interval Note:  12/10/2023 1:14 PM  Xavier Caldwell  has presented today for surgery, with the diagnosis of history of colon polyps, constipation.  The various methods of treatment have been discussed with the patient and family. After consideration of risks, benefits and other options for treatment, the patient has consented to  Procedure(s) with comments: COLONOSCOPY (N/A) - 1:00pm, ASA 3 as a surgical intervention.  The patient's history has been reviewed, patient examined, no change in status, stable for surgery.  I have reviewed the patient's chart and labs.  Questions were answered to the patient's satisfaction.     Wilmarie Sparlin   no change.   Pradaxa  held.  Surveillance colonoscopy per plan.The risks, benefits, limitations, alternatives and imponderables have been reviewed with the patient. Questions have been answered. All parties are agreeable.

## 2023-12-10 NOTE — Anesthesia Preprocedure Evaluation (Addendum)
 Anesthesia Evaluation  Patient identified by MRN, date of birth, ID band Patient awake    Reviewed: Allergy & Precautions, NPO status , Patient's Chart, lab work & pertinent test results, reviewed documented beta blocker date and time   History of Anesthesia Complications Negative for: history of anesthetic complications  Airway Mallampati: III  TM Distance: >3 FB Neck ROM: Full    Dental  (+) Lower Dentures, Upper Dentures,    Pulmonary sleep apnea , former smoker   Pulmonary exam normal breath sounds clear to auscultation       Cardiovascular Exercise Tolerance: Good hypertension, Pt. on medications + angina  + CAD, + Past MI, + Cardiac Stents, + Peripheral Vascular Disease and +CHF  Normal cardiovascular exam+ dysrhythmias Atrial Fibrillation and Ventricular Tachycardia  Rhythm:Regular Rate:Normal  Recent lexiscan  and echo are low risk   04/2018  Prox RCA lesion is 100% stenosed.  Ost LAD to Prox LAD lesion is 30% stenosed.  Mid LAD lesion is 40% stenosed.  Ost 2nd Mrg to 2nd Mrg lesion is 40% stenosed.  A drug-eluting stent was successfully placed using a STENT RESOLUTE ONYX 3.5X30.  Post intervention, there is a 0% residual stenosis.   1. 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 Ventricular fibrillation/cardiac arrest with successful resuscitation 3. Successful PTCA/DES x 1 proximal RCA.  4. Successful aspiration thrombectomy distal RCA 5. Mild to moderate non-obstructive disease in the Circumflex and LAD  Recommendations: Will admit to ICU. Echo in the am. Will continue Aggrastat  for 18 hours. Will continue ASA/Brilinta  and statin. Will not start a beta blocker today given hypotension and bradycardia. Ace inh on hold with hypotension.     1. The left ventricle has mildly reduced systolic function, with an ejection fraction of 45-50%. The cavity size was normal. There is  moderately increased left ventricular wall thickness. 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 Left ventricular diffuse hypokinesis.  2. The right ventricle has mildly reduced systolic function. The cavity  was mildly enlarged. There is no increase in right ventricular wall  thickness.  3. The mitral valve is degenerative. There is mild mitral annular  calcification present.  4. The aortic valve was not well visualized.  5. The aortic root and ascending aorta are normal in size and structure.  6. The inferior vena cava was dilated in size with <50% respiratory variability.  7. Severe hypokinesis of the left ventricular basal to mid inferior and  inferoseptal.  8. The interatrial septum was not well visualized   Neuro/Psych   Anxiety     Recent CEA    GI/Hepatic Neg liver ROS, Bowel prep,,,  Endo/Other  diabetes, Well Controlled, Type 2, Oral Hypoglycemic Agents, Insulin  Dependent Hyperthyroidism   Renal/GU Renal diseaseHistory renal failure but labs OK     Musculoskeletal  (+) Arthritis  (back pain, gout), Osteoarthritis,    Abdominal   Peds  Hematology   Anesthesia Other Findings Retinal detachment melanoma  Reproductive/Obstetrics                              Anesthesia Physical Anesthesia Plan  ASA: 3  Anesthesia Plan: General   Post-op Pain Management: Minimal or no pain anticipated   Induction: Intravenous  PONV Risk Score and Plan: Propofol  infusion  Airway Management Planned: Nasal Cannula and Natural Airway  Additional Equipment: None  Intra-op Plan:  Post-operative Plan:   Informed Consent: I have reviewed the patients History and Physical, chart, labs and discussed the procedure including the risks, benefits and alternatives for the proposed anesthesia with the patient or authorized representative who has indicated his/her  understanding and acceptance.     Dental advisory given  Plan Discussed with: CRNA  Anesthesia Plan Comments:          Anesthesia Quick Evaluation

## 2023-12-10 NOTE — Transfer of Care (Signed)
 Immediate Anesthesia Transfer of Care Note  Patient: SEYON STRADER  Procedure(s) Performed: COLONOSCOPY  Patient Location: Short Stay  Anesthesia Type:General  Level of Consciousness: awake, alert , oriented, and patient cooperative  Airway & Oxygen Therapy: Patient Spontanous Breathing  Post-op Assessment: Report given to RN and Post -op Vital signs reviewed and stable  Post vital signs: Reviewed and stable  Last Vitals:  Vitals Value Taken Time  BP 128/66 12/10/23 14:00  Temp 36.6 C 12/10/23 14:00  Pulse 71 12/10/23 14:00  Resp 23 12/10/23 14:00  SpO2 97 % 12/10/23 14:00    Last Pain:  Vitals:   12/10/23 1400  TempSrc: Oral  PainSc: Asleep      Patients Stated Pain Goal: 6 (12/10/23 1208)  Complications: There were no known notable events for this encounter.

## 2023-12-10 NOTE — Interval H&P Note (Signed)
 History and Physical Interval Note:  12/10/2023 1:16 PM  Xavier Caldwell  has presented today for surgery, with the diagnosis of history of colon polyps, constipation.  The various methods of treatment have been discussed with the patient and family. After consideration of risks, benefits and other options for treatment, the patient has consented to  Procedure(s) with comments: COLONOSCOPY (N/A) - 1:00pm, ASA 3 as a surgical intervention.  The patient's history has been reviewed, patient examined, no change in status, stable for surgery.  I have reviewed the patient's chart and labs.  Questions were answered to the patient's satisfaction.     Lamar Hollingshead

## 2023-12-10 NOTE — Op Note (Signed)
 [facility] Patient Name: Xavier Caldwell Procedure Date: 12/10/2023 12:52 PM MRN: [id number] Date of Birth: [birth year] Attending MD: Lamar Ozell Hollingshead , MD, [id number] CSN: [id number] Age: 73 Admit Type: Outpatient Procedure:                Colonoscopy Indications:              High risk colon cancer surveillance: Personal                            history of colonic polyps Providers:                Lamar Ozell Hollingshead, MD, Crystal Page, Alm Dorcas Balm., Technician Referring MD:              Medicines:                Propofol  per Anesthesia Complications:            No immediate complications. Estimated Blood Loss:     Estimated blood loss: none. Procedure:                Pre-Anesthesia Assessment:                           - Prior to the procedure, a History and Physical                            was performed, and patient medications and                            allergies were reviewed. The patient's tolerance of                            previous anesthesia was also reviewed. The risks                            and benefits of the procedure and the sedation                            options and risks were discussed with the patient.                            All questions were answered, and informed consent                            was obtained. Prior Anticoagulants: The patient                            last took Pradaxa  (dabigatran ) 2 days prior to the                            procedure. ASA Grade Assessment: III - A patient  with severe systemic disease. After reviewing the                            risks and benefits, the patient was deemed in                            satisfactory condition to undergo the procedure.                           After obtaining informed consent, the colonoscope                            was passed under direct vision. Throughout the                            procedure,  the patient's blood pressure, pulse, and                            oxygen saturations were monitored continuously. The                            [id number] ([id number]) Colon was introduced through                            the anus and advanced to the the cecum, identified                            by appendiceal orifice and ileocecal valve. The                            colonoscopy was performed without difficulty. The                            patient tolerated the procedure well. The quality                            of the bowel preparation was adequate. The                            ileocecal valve, appendiceal orifice, and rectum                            were photographed. Scope In: 1:27:35 PM Scope Out: 1:54:58 PM Scope Withdrawal Time: 0 hours 15 minutes 50 seconds  Total Procedure Duration: 0 hours 27 minutes 23 seconds  Findings:      The perianal and digital rectal examinations were normal.      Three semi-pedunculated polyps were found in the splenic flexure and       hepatic flexure. The polyps were 7 to 8 mm in size. These polyps were       removed with a hot snare. Resection and retrieval were complete.       Estimated blood loss: none.      The exam was otherwise without abnormality on direct and retroflexion       views.  Impression:               - Three 7 to 8 mm polyps at the splenic flexure and                            at the hepatic flexure, removed with a hot snare.                            Resected and retrieved.                           - The examination was otherwise normal on direct                            and retroflexion views. Moderate Sedation:      Moderate (conscious) sedation was personally administered by an       anesthesia professional. The following parameters were monitored: oxygen       saturation, heart rate, blood pressure, respiratory rate, EKG, adequacy       of pulmonary ventilation, and response to care. Recommendation:            - Patient has a contact number available for                            emergencies. The signs and symptoms of potential                            delayed complications were discussed with the                            patient. Return to normal activities tomorrow.                            Written discharge instructions were provided to the                            patient.                           - Advance diet as tolerated.                           - Continue present medications. However, do not                            resume Pradaxa  until 12/12/2023.                           - Repeat colonoscopy date to be determined after                            pending pathology results are reviewed for                            surveillance.                           -  Return to GI office (date not yet determined). Procedure Code(s):        --- Professional ---                           [phone], Colonoscopy, flexible; with removal of                            tumor(s), polyp(s), or other lesion(s) by snare                            technique Diagnosis Code(s):        --- Professional ---                           Z86.010, Personal history of colonic polyps                           D12.3, Benign neoplasm of transverse colon (hepatic                            flexure or splenic flexure) CPT copyright 2022 American Medical Association. All rights reserved. The codes documented in this report are preliminary and upon coder review may  be revised to meet current compliance requirements. Lamar HERO. Chriselda Leppert, MD Lamar Ozell Hollingshead, MD 12/10/2023 2:07:17 PM This report has been signed electronically. Number of Addenda: 0

## 2023-12-11 ENCOUNTER — Other Ambulatory Visit (HOSPITAL_COMMUNITY): Payer: Self-pay

## 2023-12-11 ENCOUNTER — Telehealth: Payer: Self-pay | Admitting: Cardiology

## 2023-12-11 ENCOUNTER — Other Ambulatory Visit (HOSPITAL_BASED_OUTPATIENT_CLINIC_OR_DEPARTMENT_OTHER): Payer: Self-pay

## 2023-12-11 LAB — SURGICAL PATHOLOGY

## 2023-12-11 MED ORDER — APIXABAN 5 MG PO TABS
5.0000 mg | ORAL_TABLET | Freq: Two times a day (BID) | ORAL | 11 refills | Status: AC
  Filled 2023-12-11 – 2023-12-12 (×2): qty 60, 30d supply, fill #0
  Filled 2024-01-07: qty 60, 30d supply, fill #1
  Filled 2024-02-05: qty 60, 30d supply, fill #2
  Filled 2024-03-06: qty 60, 30d supply, fill #3

## 2023-12-11 NOTE — Telephone Encounter (Signed)
 Spoke to pt's wife who verbalized agreement and understanding.   Please advise dosage of Eliquis 

## 2023-12-11 NOTE — Telephone Encounter (Signed)
 Pt c/o medication issue:  1. Name of Medication:  dabigatran  (PRADAXA ) 150 MG CAPS capsule  2. How are you currently taking this medication (dosage and times per day)?   3. Are you having a reaction (difficulty breathing--STAT)?   4. What is your medication issue?  Wife says insurance is not covering the medication and it increased from $7 to $59. He has 1 tablet remaining for tomorrow and she would like a call back to discuss other options.

## 2023-12-11 NOTE — Telephone Encounter (Signed)
 Eliquis  is $47, Pradaxa  is $59. The only thing cheaper than those would be warfarin, which is $0 on plan.

## 2023-12-11 NOTE — Telephone Encounter (Signed)
 Spoke to pt's wife- she is asking to switch pt back to Eliquis  as this would be more affordable for them.   Please advise.

## 2023-12-12 ENCOUNTER — Other Ambulatory Visit: Payer: Self-pay

## 2023-12-12 ENCOUNTER — Other Ambulatory Visit (HOSPITAL_COMMUNITY): Payer: Self-pay

## 2023-12-12 ENCOUNTER — Other Ambulatory Visit (HOSPITAL_BASED_OUTPATIENT_CLINIC_OR_DEPARTMENT_OTHER): Payer: Self-pay

## 2023-12-12 ENCOUNTER — Encounter (HOSPITAL_COMMUNITY): Payer: Self-pay | Admitting: Internal Medicine

## 2023-12-12 ENCOUNTER — Ambulatory Visit: Payer: Self-pay | Admitting: Internal Medicine

## 2023-12-24 ENCOUNTER — Other Ambulatory Visit (HOSPITAL_COMMUNITY)
Admission: RE | Admit: 2023-12-24 | Discharge: 2023-12-24 | Disposition: A | Discharge status: Home / Self Care | Attending: Nurse Practitioner | Admitting: Nurse Practitioner

## 2023-12-24 DIAGNOSIS — R319 Hematuria, unspecified: Secondary | ICD-10-CM | POA: Insufficient documentation

## 2023-12-24 DIAGNOSIS — R600 Localized edema: Secondary | ICD-10-CM | POA: Insufficient documentation

## 2023-12-24 DIAGNOSIS — I5032 Chronic diastolic (congestive) heart failure: Secondary | ICD-10-CM | POA: Insufficient documentation

## 2023-12-24 LAB — CBC
HCT: 41.7 % (ref 39.0–52.0)
Hemoglobin: 13.4 g/dL (ref 13.0–17.0)
MCH: 27.8 pg (ref 26.0–34.0)
MCHC: 32.1 g/dL (ref 30.0–36.0)
MCV: 86.5 fL (ref 80.0–100.0)
Platelets: 208 K/uL (ref 150–400)
RBC: 4.82 MIL/uL (ref 4.22–5.81)
RDW: 14.6 % (ref 11.5–15.5)
WBC: 10 K/uL (ref 4.0–10.5)
nRBC: 0 % (ref 0.0–0.2)

## 2023-12-24 LAB — BASIC METABOLIC PANEL WITH GFR
Anion gap: 9 (ref 5–15)
BUN: 17 mg/dL (ref 8–23)
CO2: 26 mmol/L (ref 22–32)
Calcium: 9.2 mg/dL (ref 8.9–10.3)
Chloride: 103 mmol/L (ref 98–111)
Creatinine, Ser: 1.01 mg/dL (ref 0.61–1.24)
GFR, Estimated: >60 mL/min (ref >60)
Glucose, Bld: 118 mg/dL — ABNORMAL HIGH (ref 70–99)
Potassium: 4.8 mmol/L (ref 3.5–5.1)
Sodium: 137 mmol/L (ref 135–145)

## 2023-12-25 ENCOUNTER — Ambulatory Visit: Payer: Self-pay | Admitting: Nurse Practitioner

## 2023-12-30 ENCOUNTER — Telehealth: Payer: Self-pay | Admitting: *Deleted

## 2023-12-30 NOTE — Telephone Encounter (Signed)
 Patient left a message that [employer]  had told him there was something missing from his application. I called and talked to Alfonse, a representative from [employer]. 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 The 2025 application was sent in by Dr. Bertell who is now retired. I shared this with Alfonse. She is going to send to our office what is needed so that the patient can get what he needs before 02/04/2024. Patient's wife was called and made aware.

## 2024-01-07 ENCOUNTER — Other Ambulatory Visit (HOSPITAL_COMMUNITY): Payer: Self-pay

## 2024-01-08 ENCOUNTER — Other Ambulatory Visit (HOSPITAL_COMMUNITY): Payer: Self-pay

## 2024-01-09 LAB — COMPREHENSIVE METABOLIC PANEL WITH GFR
ALT: 20 IU/L (ref 0–44)
AST: 19 IU/L (ref 0–40)
Albumin: 3.8 g/dL (ref 3.8–4.8)
Alkaline Phosphatase: 101 IU/L (ref 47–123)
BUN/Creatinine Ratio: 19 (ref 10–24)
BUN: 20 mg/dL (ref 8–27)
Bilirubin Total: 0.4 mg/dL (ref 0.0–1.2)
CO2: 22 mmol/L (ref 20–29)
Calcium: 9.1 mg/dL (ref 8.6–10.2)
Chloride: 103 mmol/L (ref 96–106)
Creatinine, Ser: 1.05 mg/dL (ref 0.76–1.27)
Globulin, Total: 2.3 g/dL (ref 1.5–4.5)
Glucose: 146 mg/dL — ABNORMAL HIGH (ref 70–99)
Potassium: 4.5 mmol/L (ref 3.5–5.2)
Sodium: 139 mmol/L (ref 134–144)
Total Protein: 6.1 g/dL (ref 6.0–8.5)
eGFR: 76 mL/min/1.73 (ref >59)

## 2024-01-09 LAB — VITAMIN D 25 HYDROXY (VIT D DEFICIENCY, FRACTURES): Vit D, 25-Hydroxy: 36.9 ng/mL (ref 30.0–100.0)

## 2024-01-09 LAB — LIPID PANEL
Chol/HDL Ratio: 2.7 ratio (ref 0.0–5.0)
Cholesterol, Total: 91 mg/dL — ABNORMAL LOW (ref 100–199)
HDL: 34 mg/dL — ABNORMAL LOW (ref >39)
LDL Chol Calc (NIH): 41 mg/dL (ref 0–99)
Triglycerides: 75 mg/dL (ref 0–149)
VLDL Cholesterol Cal: 16 mg/dL (ref 5–40)

## 2024-01-09 LAB — TSH: TSH: 0.947 u[IU]/mL (ref 0.450–4.500)

## 2024-01-09 LAB — T4, FREE: Free T4: 0.95 ng/dL (ref 0.82–1.77)

## 2024-01-19 ENCOUNTER — Telehealth: Payer: Self-pay | Admitting: Cardiology

## 2024-01-19 MED ORDER — HYDRALAZINE HCL 50 MG PO TABS: 50.0000 mg | ORAL_TABLET | Freq: Three times a day (TID) | ORAL | 3 refills | Status: AC

## 2024-01-19 MED ORDER — AMLODIPINE BESYLATE 10 MG PO TABS: 10.0000 mg | ORAL_TABLET | Freq: Every day | ORAL | 3 refills | Status: AC

## 2024-01-19 MED ORDER — ATORVASTATIN CALCIUM 80 MG PO TABS: 80.0000 mg | ORAL_TABLET | Freq: Every day | ORAL | 3 refills | Status: AC

## 2024-01-19 NOTE — Telephone Encounter (Signed)
 Refill sent for Hydralazine , Amlodipine , & Atorvastatin .  Will have to send to Sherell Crate, NP, to see about the Cymbalta  and Xanax  refill due to having to see a new PCP.

## 2024-01-19 NOTE — Telephone Encounter (Signed)
°*  STAT* If patient is at the pharmacy, call can be transferred to refill team.   1. Which medications need to be refilled? (please list name of each medication and dose if known)   hydrALAZINE  (APRESOLINE ) 50 MG tablet  amLODipine  (NORVASC ) 10 MG tablet  atorvastatin  (LIPITOR ) 80 MG tablet   ALPRAZolam  (XANAX ) 0.5 MG tablet  DULoxetine  (CYMBALTA ) 60 MG capsule   2. Would you like to learn more about the convenience, safety, & potential cost savings by using the [facility]?   3. Are you open to using the [facility] (Type [facility]. ).  4. Which pharmacy/location (including street and city if local pharmacy) is medication to be sent to?  [facility] - [place], [place] - [address] [place]   5. Do they need a 30 day or 90 day supply?   90 day  Patient stated he has 1 week left of these medication.  Patient stated he is changing to a new primary doctor and will not see them until 1/7.  Patient has an appointment scheduled with CHARLENA Crate, NP on 1/12.

## 2024-01-22 ENCOUNTER — Ambulatory Visit: Admitting: Nurse Practitioner

## 2024-02-04 ENCOUNTER — Encounter: Payer: Self-pay | Admitting: Internal Medicine

## 2024-02-06 ENCOUNTER — Other Ambulatory Visit: Payer: Self-pay

## 2024-02-06 ENCOUNTER — Other Ambulatory Visit (HOSPITAL_COMMUNITY): Payer: Self-pay

## 2024-02-09 ENCOUNTER — Other Ambulatory Visit: Payer: Self-pay | Admitting: Nurse Practitioner

## 2024-02-09 ENCOUNTER — Other Ambulatory Visit: Payer: Self-pay

## 2024-02-09 MED ORDER — POTASSIUM CHLORIDE CRYS ER 20 MEQ PO TBCR
20.0000 meq | EXTENDED_RELEASE_TABLET | Freq: Every day | ORAL | 0 refills | Status: AC
  Filled 2024-02-09: qty 90, 90d supply, fill #0

## 2024-02-10 ENCOUNTER — Ambulatory Visit: Admitting: Nurse Practitioner

## 2024-02-11 ENCOUNTER — Encounter: Payer: Self-pay | Admitting: Nurse Practitioner

## 2024-02-11 ENCOUNTER — Ambulatory Visit (INDEPENDENT_AMBULATORY_CARE_PROVIDER_SITE_OTHER): Payer: Self-pay

## 2024-02-11 ENCOUNTER — Ambulatory Visit: Admitting: Nurse Practitioner

## 2024-02-11 VITALS — BP 131/71 | HR 67 | Ht 71.0 in | Wt 227.0 lb

## 2024-02-11 VITALS — BP 114/60 | HR 63 | Ht 71.0 in | Wt 228.0 lb

## 2024-02-11 DIAGNOSIS — E785 Hyperlipidemia, unspecified: Secondary | ICD-10-CM | POA: Diagnosis not present

## 2024-02-11 DIAGNOSIS — R7989 Other specified abnormal findings of blood chemistry: Secondary | ICD-10-CM | POA: Diagnosis not present

## 2024-02-11 DIAGNOSIS — E042 Nontoxic multinodular goiter: Secondary | ICD-10-CM

## 2024-02-11 DIAGNOSIS — F419 Anxiety disorder, unspecified: Secondary | ICD-10-CM

## 2024-02-11 DIAGNOSIS — N182 Chronic kidney disease, stage 2 (mild): Secondary | ICD-10-CM

## 2024-02-11 DIAGNOSIS — Z794 Long term (current) use of insulin: Secondary | ICD-10-CM

## 2024-02-11 DIAGNOSIS — E782 Mixed hyperlipidemia: Secondary | ICD-10-CM | POA: Diagnosis not present

## 2024-02-11 DIAGNOSIS — E1122 Type 2 diabetes mellitus with diabetic chronic kidney disease: Secondary | ICD-10-CM

## 2024-02-11 DIAGNOSIS — I10 Essential (primary) hypertension: Secondary | ICD-10-CM

## 2024-02-11 DIAGNOSIS — Z7984 Long term (current) use of oral hypoglycemic drugs: Secondary | ICD-10-CM | POA: Diagnosis not present

## 2024-02-11 DIAGNOSIS — N1831 Chronic kidney disease, stage 3a: Secondary | ICD-10-CM

## 2024-02-11 DIAGNOSIS — E1169 Type 2 diabetes mellitus with other specified complication: Secondary | ICD-10-CM

## 2024-02-11 DIAGNOSIS — E041 Nontoxic single thyroid nodule: Secondary | ICD-10-CM | POA: Diagnosis not present

## 2024-02-11 LAB — POCT GLYCOSYLATED HEMOGLOBIN (HGB A1C): Hemoglobin A1C: 8.6 % — AB (ref 4.0–5.6)

## 2024-02-11 MED ORDER — GLIMEPIRIDE 4 MG PO TABS: 4.0000 mg | ORAL_TABLET | Freq: Every day | ORAL | 3 refills | Status: AC

## 2024-02-11 MED ORDER — ALPRAZOLAM 0.5 MG PO TABS: 0.5000 mg | ORAL_TABLET | Freq: Two times a day (BID) | ORAL | 0 refills | Status: AC | PRN

## 2024-02-11 MED ORDER — DULOXETINE HCL 60 MG PO CPEP: 60.0000 mg | ORAL_CAPSULE | Freq: Every day | ORAL | 3 refills | Status: AC

## 2024-02-11 MED ORDER — EZETIMIBE 10 MG PO TABS: 10.0000 mg | ORAL_TABLET | Freq: Every day | ORAL | 3 refills | Status: AC

## 2024-02-11 NOTE — Progress Notes (Signed)
 "  New Patient Office Visit  Subjective    Patient ID: Xavier Caldwell, male    DOB: Mar 30, 1950  Age: 74 y.o. MRN: [id number]  CC:  Chief Complaint  Patient presents with   Establish Care    HPI Xavier Caldwell presents to establish care  Discussed the use of AI scribe software for clinical note transcription with the patient, who gave verbal consent to proceed.  History of Present Illness    Xavier Caldwell is a 74 year old male with neuropathy who presents with leg pain and numbness.  Peripheral neuropathy and lower extremity pain - Shooting pain in both legs, sometimes radiating down to the foot - Pain severity can be intense enough to cause vocal outbursts - Numbness predominantly in the right leg - Requires assistance to walk to the bathroom due to numbness - Uses a cane for mobility but is reluctant to rely on it consistently - Ran out of duloxetine  about a month ago, previously used for neuropathy - Currently taking gabapentin , with consideration for dosage increase  Gait instability and falls - History of three falls since May - Legs give out suddenly, contributing to falls  Diabetes mellitus management - Diabetes managed with glimepiride  - A1c levels have been improving - Diabetes medication was not refilled during a recent visit with another provider  Urinary frequency and weight loss - Increased urination - Prescribed a diuretic by Almarie Crate - Recent weight loss from 230 pounds to 227 pounds  Anxiety - Ran out of Xanax  about a month ago, previously used for anxiety  Hyperlipidemia - Taking Zetia  for cholesterol management     Outpatient Encounter Medications as of 02/11/2024  Medication Sig   amLODipine  (NORVASC ) 10 MG tablet Take 1 tablet (10 mg total) by mouth daily.   apixaban  (ELIQUIS ) 5 MG TABS tablet Take 1 tablet (5 mg total) by mouth 2 (two) times daily.   atorvastatin  (LIPITOR ) 80 MG tablet Take 1 tablet (80 mg total) by mouth daily.   carvedilol   (COREG ) 12.5 MG tablet Take 1 tablet (12.5 mg total) by mouth 2 (two) times daily.   Continuous Glucose Sensor (DEXCOM G7 SENSOR) MISC Change sensor every 10 days. Dx E11.65   gabapentin  (NEURONTIN ) 300 MG capsule Take 300 mg by mouth 2 (two) times daily.    hydrALAZINE  (APRESOLINE ) 50 MG tablet Take 1 tablet (50 mg total) by mouth 3 (three) times daily.   insulin  degludec (TRESIBA  FLEXTOUCH) 100 UNIT/ML FlexTouch Pen Inject 40 Units into the skin at bedtime.   linaclotide  (LINZESS ) 145 MCG CAPS capsule Take 1 capsule (145 mcg total) by mouth daily before breakfast.   metFORMIN  (GLUCOPHAGE ) 500 MG tablet Take 1 tablet (500 mg total) by mouth 2 (two) times daily with a meal.   Misc Natural Products (COLON CLEANSE PO) Take by mouth.   nitroGLYCERIN  (NITROSTAT ) 0.4 MG SL tablet Place 1 tablet (0.4 mg total) under the tongue every 5 (five) minutes as needed (if no relief after 3rd dose proceed to ER or call 911).   potassium chloride  SA (KLOR-CON  M) 20 MEQ tablet Take 1 tablet (20 mEq total) by mouth daily.   rOPINIRole  (REQUIP ) 1 MG tablet Take 1 mg by mouth 4 (four) times daily.    timolol  (TIMOPTIC ) 0.5 % ophthalmic solution INSTILL 1 DROP INTO EACH EYE TWICE DAILY   torsemide  (DEMADEX ) 20 MG tablet Take 1 tablet (20 mg total) by mouth daily.   UNABLE TO FIND Med Name: Super Vitamin B  complex one daily   UNIFINE PENTIPS 31G X 5 MM MISC USE TO INJECT INSULIN  AT BEDTIME   [DISCONTINUED] ALPRAZolam  (XANAX ) 0.5 MG tablet Take 0.5 mg by mouth 2 (two) times daily as needed for anxiety.   [DISCONTINUED] DULoxetine  (CYMBALTA ) 60 MG capsule Take 60 mg by mouth daily with lunch.    [DISCONTINUED] ezetimibe  (ZETIA ) 10 MG tablet Take 1 tablet (10 mg total) by mouth daily.   [DISCONTINUED] glimepiride  (AMARYL ) 4 MG tablet Take 0.5 tablets (2 mg total) by mouth daily.   ALPRAZolam  (XANAX ) 0.5 MG tablet Take 1 tablet (0.5 mg total) by mouth 2 (two) times daily as needed for anxiety.   DULoxetine  (CYMBALTA ) 60 MG  capsule Take 1 capsule (60 mg total) by mouth daily with lunch.   ezetimibe  (ZETIA ) 10 MG tablet Take 1 tablet (10 mg total) by mouth daily.   glimepiride  (AMARYL ) 4 MG tablet Take 1 tablet (4 mg total) by mouth daily.   No facility-administered encounter medications on file as of 02/11/2024.    Past Medical History:  Diagnosis Date   Acute systolic (congestive) heart failure (HCC)    Anxiety    Arthritis    Atrial fibrillation (HCC)    Back pain    Chronic kidney disease    pt told by PA of Dr. Lenis that he had stage 3 kidney so they had me stop taking Metformin    Coronary artery disease    Diabetes mellitus without complication (HCC)    High cholesterol    History of gout    Hypertension    Melanoma (HCC)    nose. hx of Mohs   Myocardial infarction [facility]) 04/2018   Neuropathy    PAD (peripheral artery disease)    Retinal detachment    One on the right and two on the left   STEMI (ST elevation myocardial infarction) (HCC)    04/08/2018 PCI/DES RCA   Stroke [facility])    February 2022   VT (ventricular tachycardia) [facility])     Past Surgical History:  Procedure Laterality Date   APPENDECTOMY     CARDIOVERSION N/A 04/13/2018   Procedure: CARDIOVERSION;  Surgeon: Raford Riggs, MD;  Location: [facility] ENDOSCOPY;  Service: Cardiovascular;  Laterality: N/A;   CATARACT EXTRACTION W/PHACO Right 04/25/2014   Procedure: CATARACT EXTRACTION PHACO AND INTRAOCULAR LENS PLACEMENT (IOC);  Surgeon: Dow JULIANNA Burke, MD;  Location: [facility] ORS;  Service: Ophthalmology;  Laterality: Right;  CDE:4.70   CATARACT EXTRACTION W/PHACO Left 07/11/2014   Procedure: CATARACT EXTRACTION PHACO AND INTRAOCULAR LENS PLACEMENT LEFT EYE CDE=8.32;  Surgeon: Dow JULIANNA Burke, MD;  Location: [facility] ORS;  Service: Ophthalmology;  Laterality: Left;   COLONOSCOPY N/A 12/10/2023   Procedure: COLONOSCOPY;  Surgeon: Shaaron Lamar HERO, MD;  Location: [facility] ENDO SUITE;  Service: Endoscopy;  Laterality: N/A;  1:00pm, ASA 3   COLONOSCOPY  WITH PROPOFOL  N/A 09/23/2016   Rourk: 9 polyps removed, multiple tubular adenomas. recommended 3 yr surveillance   COLONOSCOPY WITH PROPOFOL  N/A 12/27/2019   ten sessile polyps, 4-9 mm in size in descending, hepatic flexure, and cecum, 11 mm polyp in sigmoid, three sessile polyps in rectum and mid rectum 2-3 mm in size. Tubular adenomas and hyperplastic. 3 year surveillance   CORONARY/GRAFT ACUTE MI REVASCULARIZATION N/A 04/07/2018   Procedure: Coronary/Graft Acute MI Revascularization;  Surgeon: Verlin Lonni BIRCH, MD;  Location: [facility] INVASIVE CV LAB;  Service: Cardiovascular;  Laterality: N/A;   ENDARTERECTOMY Right 03/29/2020   Procedure: RIGHT CAROTID ARTERY ENDARTERECTOMY WITH PATCH ANGIOPLASTY;  Surgeon: Gretta Lonni PARAS, MD;  Location: [facility] OR;  Service: Vascular;  Laterality: Right;   ENDARTERECTOMY Left 09/11/2020   Procedure: ENDARTERECTOMY CAROTID LEFT;  Surgeon: Gretta Lonni PARAS, MD;  Location: [facility] OR;  Service: Vascular;  Laterality: Left;   LEFT HEART CATH AND CORONARY ANGIOGRAPHY N/A 04/07/2018   Procedure: LEFT HEART CATH AND CORONARY ANGIOGRAPHY;  Surgeon: Verlin Lonni BIRCH, MD;  Location: [facility] INVASIVE CV LAB;  Service: Cardiovascular;  Laterality: N/A;   MELANOMA EXCISION  07/2019   PATCH ANGIOPLASTY Left 09/11/2020   Procedure: PATCH ANGIOPLASTY;  Surgeon: Gretta Lonni PARAS, MD;  Location: [facility] OR;  Service: Vascular;  Laterality: Left;   POLYPECTOMY  09/23/2016   Procedure: POLYPECTOMY;  Surgeon: Shaaron Lamar HERO, MD;  [facility] END[facility];  Service: Endoscopy;;  colon   POLYPECTOMY  12/27/2019   Procedure: POLYPECTOMY;  Surgeon: Shaaron Lamar HERO, MD;  Location: [facility];  Service: Endoscopy;;   RETINAL DETACHMENT SURGERY      Family History  Problem Relation Age of Onset   Hypertension Father    Colon cancer Neg Hx    Diabetes Neg Hx     Social History   Socioeconomic History   Marital status: Married    Spouse name: Not on file   Number of  children: Not on file   Years of education: Not on file   Highest education level: Not on file  Occupational History   Not on file  Tobacco Use   Smoking status: Former    Current packs/day: 0.00    Average packs/day: 1.5 packs/day for 30.0 years (45.0 ttl pk-yrs)    Types: Cigarettes    Start date: 07/06/1982    Quit date: 07/05/2012    Years since quitting: 11.6    Passive exposure: Never   Smokeless tobacco: Never  Vaping Use   Vaping status: Never Used  Substance and Sexual Activity   Alcohol use: No   Drug use: No   Sexual activity: Not Currently    Birth control/protection: None  Other Topics Concern   Not on file  Social History Narrative   Not on file   Social Drivers of Health   Tobacco Use: Medium Risk (02/11/2024)   Patient History    Smoking Tobacco Use: Former    Smokeless Tobacco Use: Never    Passive Exposure: Never  Physicist, Medical Strain: Low Risk (06/06/2022)   Overall Financial Resource Strain (CARDIA)    Difficulty of Paying Living Expenses: Not hard at all  Food Insecurity: No Food Insecurity (09/20/2022)   Hunger Vital Sign    Worried About Running Out of Food in the Last Year: Never true    Ran Out of Food in the Last Year: Never true  Transportation Needs: No Transportation Needs (09/20/2022)   PRAPARE - Administrator, Civil Service (Medical): No    Lack of Transportation (Non-Medical): No  Physical Activity: Sufficiently Active (09/20/2022)   Exercise Vital Sign    Days of Exercise per Week: 5 days    Minutes of Exercise per Session: 60 min  Stress: No Stress Concern Present (06/26/2022)   [employer] of Occupational Health - Occupational Stress Questionnaire    Feeling of Stress : Only a little  Social Connections: Socially Integrated (12/07/2021)   Social Connection and Isolation Panel    Frequency of Communication with Friends and Family: More than three times a week    Frequency of Social Gatherings with Friends and Family:  More than  three times a week    Attends Religious Services: More than 4 times per year    Active Member of Clubs or Organizations: Yes    Attends Banker Meetings: More than 4 times per year    Marital Status: Married  Catering Manager Violence: Not on file  Depression (PHQ2-9): Medium Risk (02/11/2024)   Depression (PHQ2-9)    PHQ-2 Score: 8  Alcohol Screen: Not on file  Housing: Low Risk (02/07/2022)   Housing    Last Housing Risk Score: 0  Utilities: Not At Risk (02/07/2022)   AHC Utilities    Threatened with loss of utilities: No  Health Literacy: Not on file   ROS      Objective    BP 131/71   Pulse 67   Ht 5' 11 (1.803 m)   Wt 227 lb 0.6 oz (103 kg)   SpO2 96%   BMI 31.67 kg/m   Physical Exam Vitals and nursing note reviewed.  Constitutional:      Appearance: Normal appearance. He is obese.  HENT:     Head: Normocephalic.  Eyes:     Extraocular Movements: Extraocular movements intact.     Pupils: Pupils are equal, round, and reactive to light.  Cardiovascular:     Rate and Rhythm: Normal rate and regular rhythm.  Pulmonary:     Effort: Pulmonary effort is normal.     Breath sounds: Normal breath sounds.  Musculoskeletal:     Cervical back: Normal range of motion and neck supple.  Neurological:     Mental Status: He is alert and oriented to person, place, and time.  Psychiatric:        Mood and Affect: Mood normal.        Thought Content: Thought content normal.         Assessment & Plan:   Problem List Items Addressed This Visit       Endocrine   Hyperlipidemia associated with type 2 diabetes mellitus (HCC) (Chronic)   Diabetic neuropathy with shooting leg pain, possibly sciatic. Hyperlipidemia managed with Zetia . - Sent prescription for duloxetine  to [facility]. - Sent prescription for Zetia  to [facility].      Relevant Medications   glimepiride  (AMARYL ) 4 MG tablet   ezetimibe  (ZETIA ) 10 MG tablet   Diabetes (HCC) - Primary   A1c  levels decreased slightly. Glimepiride  used for management. - Sent prescription for glimepiride  to [facility].      Relevant Medications   glimepiride  (AMARYL ) 4 MG tablet   DULoxetine  (CYMBALTA ) 60 MG capsule     Other   Anxiety   Managed with Xanax , taken once daily. - PDMP reviewed.  - Sent prescription for Xanax  to [facility].      Relevant Medications   ALPRAZolam  (XANAX ) 0.5 MG tablet   DULoxetine  (CYMBALTA ) 60 MG capsule    Return in about 6 months (around 08/10/2024) for chronic follow-up with PCP.   Leita Longs, FNP   "

## 2024-02-11 NOTE — Progress Notes (Signed)
 " Endocrinology Follow Up Note 02/11/2024    ---------------------------------------------------------------------------------------------------------------------- Subjective    Past Medical History:  Diagnosis Date   Acute systolic (congestive) heart failure (HCC)    Anxiety    Arthritis    Atrial fibrillation (HCC)    Back pain    Chronic kidney disease    pt told by PA of Dr. Lenis that he had stage 3 kidney so they had me stop taking Metformin    Coronary artery disease    Diabetes mellitus without complication (HCC)    High cholesterol    History of gout    Hypertension    Melanoma (HCC)    nose. hx of Mohs   Myocardial infarction [facility]) 04/2018   Neuropathy    PAD (peripheral artery disease)    Retinal detachment    One on the right and two on the left   STEMI (ST elevation myocardial infarction) (HCC)    04/08/2018 PCI/DES RCA   Stroke [facility])    February 2022   VT (ventricular tachycardia) [facility])     Past Surgical History:  Procedure Laterality Date   APPENDECTOMY     CARDIOVERSION N/A 04/13/2018   Procedure: CARDIOVERSION;  Surgeon: Raford Riggs, MD;  Location: [facility] ENDOSCOPY;  Service: Cardiovascular;  Laterality: N/A;   CATARACT EXTRACTION W/PHACO Right 04/25/2014   Procedure: CATARACT EXTRACTION PHACO AND INTRAOCULAR LENS PLACEMENT (IOC);  Surgeon: Dow JULIANNA Burke, MD;  Location: [facility] ORS;  Service: Ophthalmology;  Laterality: Right;  CDE:4.70   CATARACT EXTRACTION W/PHACO Left 07/11/2014   Procedure: CATARACT EXTRACTION PHACO AND INTRAOCULAR LENS PLACEMENT LEFT EYE CDE=8.32;  Surgeon: Dow JULIANNA Burke, MD;  Location: [facility] ORS;  Service: Ophthalmology;  Laterality: Left;   COLONOSCOPY N/A 12/10/2023   Procedure: COLONOSCOPY;  Surgeon: Shaaron Lamar HERO, MD;  Location: [facility] ENDO SUITE;  Service: Endoscopy;  Laterality: N/A;  1:00pm, ASA 3   COLONOSCOPY WITH PROPOFOL  N/A 09/23/2016   Rourk: 9 polyps removed, multiple tubular adenomas. recommended 3 yr surveillance    COLONOSCOPY WITH PROPOFOL  N/A 12/27/2019   ten sessile polyps, 4-9 mm in size in descending, hepatic flexure, and cecum, 11 mm polyp in sigmoid, three sessile polyps in rectum and mid rectum 2-3 mm in size. Tubular adenomas and hyperplastic. 3 year surveillance   CORONARY/GRAFT ACUTE MI REVASCULARIZATION N/A 04/07/2018   Procedure: Coronary/Graft Acute MI Revascularization;  Surgeon: Verlin Lonni BIRCH, MD;  Location: [facility] INVASIVE CV LAB;  Service: Cardiovascular;  Laterality: N/A;   ENDARTERECTOMY Right 03/29/2020   Procedure: RIGHT CAROTID ARTERY ENDARTERECTOMY WITH PATCH ANGIOPLASTY;  Surgeon: Gretta Lonni PARAS, MD;  Location: [facility] OR;  Service: Vascular;  Laterality: Right;   ENDARTERECTOMY Left 09/11/2020   Procedure: ENDARTERECTOMY CAROTID LEFT;  Surgeon: Gretta Lonni PARAS, MD;  Location: [facility] OR;  Service: Vascular;  Laterality: Left;   LEFT HEART CATH AND CORONARY ANGIOGRAPHY N/A 04/07/2018   Procedure: LEFT HEART CATH AND CORONARY ANGIOGRAPHY;  Surgeon: Verlin Lonni BIRCH, MD;  Location: [facility] INVASIVE CV LAB;  Service: Cardiovascular;  Laterality: N/A;   MELANOMA EXCISION  07/2019   PATCH ANGIOPLASTY Left 09/11/2020   Procedure: PATCH ANGIOPLASTY;  Surgeon: Gretta Lonni PARAS, MD;  Location: [facility] OR;  Service: Vascular;  Laterality: Left;   POLYPECTOMY  09/23/2016   Procedure: POLYPECTOMY;  Surgeon: Shaaron Lamar HERO, MD;  Location: [facility] ENDO SUITE;  Service: Endoscopy;;  colon   POLYPECTOMY  12/27/2019   Procedure: POLYPECTOMY;  Surgeon: Shaaron Lamar HERO, MD;  Location: [facility] ENDO SUITE;  Service: Endoscopy;;   RETINAL DETACHMENT  SURGERY      Social History   Socioeconomic History   Marital status: Married    Spouse name: Not on file   Number of children: Not on file   Years of education: Not on file   Highest education level: Not on file  Occupational History   Not on file  Tobacco Use   Smoking status: Former    Current packs/day: 0.00    Average packs/day: 1.5 packs/day  for 30.0 years (45.0 ttl pk-yrs)    Types: Cigarettes    Start date: 07/06/1982    Quit date: 07/05/2012    Years since quitting: 11.6    Passive exposure: Never   Smokeless tobacco: Never  Vaping Use   Vaping status: Never Used  Substance and Sexual Activity   Alcohol use: No   Drug use: No   Sexual activity: Not Currently    Birth control/protection: None  Other Topics Concern   Not on file  Social History Narrative   Not on file   Social Drivers of Health   Tobacco Use: Medium Risk (02/11/2024)   Patient History    Smoking Tobacco Use: Former    Smokeless Tobacco Use: Never    Passive Exposure: Never  Physicist, Medical Strain: Low Risk (06/06/2022)   Overall Financial Resource Strain (CARDIA)    Difficulty of Paying Living Expenses: Not hard at all  Food Insecurity: No Food Insecurity (09/20/2022)   Hunger Vital Sign    Worried About Running Out of Food in the Last Year: Never true    Ran Out of Food in the Last Year: Never true  Transportation Needs: No Transportation Needs (09/20/2022)   PRAPARE - Administrator, Civil Service (Medical): No    Lack of Transportation (Non-Medical): No  Physical Activity: Sufficiently Active (09/20/2022)   Exercise Vital Sign    Days of Exercise per Week: 5 days    Minutes of Exercise per Session: 60 min  Stress: No Stress Concern Present (06/26/2022)   [employer] of Occupational Health - Occupational Stress Questionnaire    Feeling of Stress : Only a little  Social Connections: Socially Integrated (12/07/2021)   Social Connection and Isolation Panel    Frequency of Communication with Friends and Family: More than three times a week    Frequency of Social Gatherings with Friends and Family: More than three times a week    Attends Religious Services: More than 4 times per year    Active Member of [employer] or Organizations: Yes    Attends Engineer, Structural: More than 4 times per year    Marital Status: Married   Catering Manager Violence: Not on file  Depression (EYV7-0): Not on file  Alcohol Screen: Not on file  Housing: Low Risk (02/07/2022)   Housing    Last Housing Risk Score: 0  Utilities: Not At Risk (02/07/2022)   AHC Utilities    Threatened with loss of utilities: No  Health Literacy: Not on file    Current Outpatient Medications on File Prior to Visit  Medication Sig Dispense Refill   ALPRAZolam  (XANAX ) 0.5 MG tablet Take 0.5 mg by mouth 2 (two) times daily as needed for anxiety.     amLODipine  (NORVASC ) 10 MG tablet Take 1 tablet (10 mg total) by mouth daily. 90 tablet 3   apixaban  (ELIQUIS ) 5 MG TABS tablet Take 1 tablet (5 mg total) by mouth 2 (two) times daily. 60 tablet 11   atorvastatin  (LIPITOR ) 80 MG tablet  Take 1 tablet (80 mg total) by mouth daily. 90 tablet 3   carvedilol  (COREG ) 12.5 MG tablet Take 1 tablet (12.5 mg total) by mouth 2 (two) times daily. 180 tablet 3   Continuous Glucose Sensor (DEXCOM G7 SENSOR) MISC Change sensor every 10 days. Dx E11.65 9 each 3   DULoxetine  (CYMBALTA ) 60 MG capsule Take 60 mg by mouth daily with lunch.      ezetimibe  (ZETIA ) 10 MG tablet Take 1 tablet (10 mg total) by mouth daily. 30 tablet 5   gabapentin  (NEURONTIN ) 300 MG capsule Take 300 mg by mouth 2 (two) times daily.      glimepiride  (AMARYL ) 4 MG tablet Take 0.5 tablets (2 mg total) by mouth daily.     hydrALAZINE  (APRESOLINE ) 50 MG tablet Take 1 tablet (50 mg total) by mouth 3 (three) times daily. 270 tablet 3   insulin  degludec (TRESIBA  FLEXTOUCH) 100 UNIT/ML FlexTouch Pen Inject 40 Units into the skin at bedtime. 36 mL 3   linaclotide  (LINZESS ) 145 MCG CAPS capsule Take 1 capsule (145 mcg total) by mouth daily before breakfast.     metFORMIN  (GLUCOPHAGE ) 500 MG tablet Take 1 tablet (500 mg total) by mouth 2 (two) times daily with a meal. 180 tablet 3   Misc Natural Products (COLON CLEANSE PO) Take by mouth.     nitroGLYCERIN  (NITROSTAT ) 0.4 MG SL tablet Place 1 tablet (0.4 mg total)  under the tongue every 5 (five) minutes as needed (if no relief after 3rd dose proceed to ER or call 911). 25 tablet 2   potassium chloride  SA (KLOR-CON  M) 20 MEQ tablet Take 1 tablet (20 mEq total) by mouth daily. 90 tablet 0   rOPINIRole  (REQUIP ) 1 MG tablet Take 1 mg by mouth 4 (four) times daily.      timolol  (TIMOPTIC ) 0.5 % ophthalmic solution INSTILL 1 DROP INTO EACH EYE TWICE DAILY 10 mL 10   torsemide  (DEMADEX ) 20 MG tablet Take 1 tablet (20 mg total) by mouth daily. 90 tablet 3   UNABLE TO FIND Med Name: Super Vitamin B complex one daily     UNIFINE PENTIPS 31G X 5 MM MISC USE TO INJECT INSULIN  AT BEDTIME 100 each 5   No current facility-administered medications on file prior to visit.      HPI  Diabetes He presents for his follow-up diabetic visit. He has type 2 diabetes mellitus. Onset time: diagnosed at approx age of 36. His disease course has been improving. There are no hypoglycemic associated symptoms. Associated symptoms include blurred vision (hx detached retina) and fatigue. There are no hypoglycemic complications. Symptoms are stable. Diabetic complications include a CVA and heart disease (previous MI). Risk factors for coronary artery disease include diabetes mellitus, dyslipidemia, family history, obesity, male sex and hypertension. Current diabetic treatment includes insulin  injections and oral agent (dual therapy). He is compliant with treatment most of the time. His weight is fluctuating minimally. He is following a generally healthy diet. When asked about meal planning, he reported none. He has not had a previous visit with a dietitian. He participates in exercise daily. His home blood glucose trend is decreasing steadily. His overall blood glucose range is 180-200 mg/dl. (He presents today with his CGM showing improving, mostly at target glycemic profile overall.  His POCT A1c today is 8.6%, improving from last visit of 9.7%.  Analysis of his CGM shows TIR 53%, TAR 47%, TBR  <1% with a GMI of 7.8%.  ) An ACE inhibitor/angiotensin II receptor  blocker is not being taken. He sees a podiatrist.Eye exam is current.    Xavier Caldwell is a 74 y.o.-year-old male, referred by his PCP, Dr. Bertell, for evaluation for multinodular goiter and now for uncontrolled diabetes management.  His nodule was incidentally found on CT chest following up on possible lung nodule.  He was then sent for thyroid  ultrasound to assess in depth. And subsequently was sent for FNA of mildly suspicious thyroid  nodule.  Unfortunately, the specimen obtained was not enough to rule out malignancy, thus needed to be repeated.  The repeat biopsy was sufficient to determine benignity.     I reviewed pt's thyroid  tests: Lab Results  Component Value Date   TSH 0.947 01/08/2024   TSH 0.441 (L) 02/12/2023   TSH 0.601 05/22/2022   TSH 0.434 (L) 03/04/2022   TSH 0.17 (L) 09/21/2020   TSH 0.166 (L) 02/29/2020   TSH 0.668 04/17/2018   TSH 0.547 04/11/2018   TSH 0.314 (L) 01/07/2017   TSH 0.410 (L) 01/01/2016   FREET4 0.95 01/08/2024   FREET4 1.12 02/12/2023   FREET4 1.31 05/22/2022   FREET4 0.99 03/04/2022   FREET4 0.82 09/21/2020   FREET4 1.20 02/29/2020   FREET4 1.13 01/07/2017   FREET4 1.05 01/01/2016    No FH of thyroid  ds. No FH of thyroid  cancer. No h/o radiation tx to head or neck.  No seaweed or kelp. No recent contrast studies. No steroid use. No herbal supplements. No Biotin supplements or Hair, Skin and Nails vitamins.  Pt also has a history of DM, HLD, PVD, Afib, CAD  with MI, HTN.  Review of systems  Constitutional: + decreasing body weight,  current Body mass index is 31.8 kg/m. , no fatigue, no subjective hyperthermia, no subjective hypothermia Eyes: no blurry vision, no xerophthalmia ENT: no sore throat, no nodules palpated in throat, no dysphagia/odynophagia, no hoarseness Cardiovascular: no chest pain, no shortness of breath, no palpitations, no leg swelling Respiratory: no  cough, no shortness of breath Gastrointestinal: no nausea/vomiting/diarrhea Musculoskeletal: no muscle/joint aches Skin: no rashes, no hyperemia Neurological: no tremors, no numbness, no tingling, no dizziness Psychiatric: no depression, no anxiety  ---------------------------------------------------------------------------------------------------------------------- Objective    BP 114/60 (BP Location: Left Arm, Patient Position: Sitting, Cuff Size: Large)   Pulse 63   Ht 5' 11 (1.803 m)   Wt 228 lb (103.4 kg)   BMI 31.80 kg/m    BP Readings from Last 3 Encounters:  02/11/24 114/60  12/10/23 128/66  12/03/23 (!) 101/59    Wt Readings from Last 3 Encounters:  02/11/24 228 lb (103.4 kg)  12/10/23 235 lb 14.3 oz (107 kg)  12/03/23 235 lb 14.4 oz (107 kg)     Physical Exam- Limited  Constitutional:  Body mass index is 31.8 kg/m. , not in acute distress, normal state of mind Eyes:  EOMI, no exophthalmos Musculoskeletal: no gross deformities, strength intact in all four extremities, no gross restriction of joint movements Skin:  no rashes, no hyperemia Neurological: no tremor with outstretched hands    Thyroid  Ultrasound from 01/21/22 CLINICAL DATA:  Incidental on CT.   EXAM: THYROID  ULTRASOUND   TECHNIQUE: Ultrasound examination of the thyroid  gland and adjacent soft tissues was performed.   COMPARISON:  CT chest January 14, 2022   FINDINGS: Parenchymal Echotexture: Moderately heterogenous   Isthmus: 1.1 cm   Right lobe: 7.8 x 3.1 x 3.0 cm   Left lobe: 6.6 x 3.2 x 2.8 cm   _________________________________________________________   Estimated total number  of nodules >/= 1 cm:   Number of spongiform nodules >/=  2 cm not described below (TR1): 0   Number of mixed cystic and solid nodules >/= 1.5 cm not described below (TR2): 0   _________________________________________________________   Nodule labeled 4 appears to be a mixed cystic and solid  isoechoic TR 2 nodule in the thyroid  isthmus measuring 1.5 cm. This nodule does NOT meet TI-RADS criteria for biopsy or dedicated follow-up.   Nodule labeled 1 is a mixed cystic and solid isoechoic TR 2 nodule in the superior right thyroid  lobe measuring 1.7 cm. This nodule does NOT meet TI-RADS criteria for biopsy or dedicated follow-up.   Nodule labeled 2 is a mixed cystic and solid isoechoic TR 2 nodule in the mid right thyroid  lobe measuring 1.1 cm. This nodule does NOT meet TI-RADS criteria for biopsy or dedicated follow-up.   Nodule labeled 3 appears to be best described as a solid isoechoic TR 3 nodule in the inferior right thyroid  lobe measuring 3.7 x 3.2 x 3.0 cm. **Given size (>/= 2.5 cm) and appearance, fine needle aspiration of this mildly suspicious nodule should be considered based on TI-RADS criteria.   Nodule labeled 5 is a mixed cystic and solid isoechoic TR 2 nodule in the superior left thyroid  lobe measuring 1.5 cm. This nodule does NOT meet TI-RADS criteria for biopsy or dedicated follow-up.   Nodule labeled 6 is a mixed cystic and solid isoechoic TR 2 nodule in the posterior aspect of the mid left thyroid  lobe measuring 1.5 cm. This nodule does NOT meet TI-RADS criteria for biopsy or dedicated follow-up.   IMPRESSION: 1. Enlarged multinodular thyroid  gland. 2. Nodule labeled 3 in the inferior right thyroid  lobe (3.7 cm TR 3) meets criteria for biopsy. 3. A number of additional benign-appearing nodules as described do not meet criteria for further dedicated follow-up or biopsy.   The above is in keeping with the ACR TI-RADS recommendations - J Am Coll Radiol 2017;14:587-595.     Electronically Signed   By: Felton Fry M.D.   On: 01/22/2022 14:02  FNA on 03/12/22  CYTOLOGY - NON PAP  CASE: [id number]  PATIENT: Xavier Caldwell  Non-Gynecological Cytology Report      Clinical History: 3.7 cm RLL; TR3  Specimen Submitted:  A. THYROID , RLL, FINE  NEEDLE ASPIRATION:    FINAL MICROSCOPIC DIAGNOSIS:  - Scant follicular epithelium present (Bethesda category I)   SPECIMEN ADEQUACY:  Satisfactory but limited for evaluation, scant cellularity   DIAGNOSTIC COMMENTS:  Inflammation present.   GROSS:  Received is/are 6 slides in 95% Ethyl alcohol, and 30 ccs of pale pink  Cytolyt solution. (CM:cm)  Prepared:  Smears: 6  Concentration Method (Thin Prep):  1  Cell Block:  Cell block attempted, not obtained.  Additional Studies:  Also there was an Afirma collected.  ------------------------------------------------------------------------------------------------------------- Repeat FNA 06/19/22 CYTOLOGY - NON PAP CASE: [id number] PATIENT: Xavier Caldwell Non-Gynecological Cytology Report  Clinical History: Thyroid  nodule Specimen Submitted:  A. THYROID , RIGHT LOWER LOBE, FINE NEEDLE ASPIRATION:  FINAL MICROSCOPIC DIAGNOSIS: - Consistent with benign follicular nodule (Bethesda category II)  SPECIMEN ADEQUACY: Satisfactory for evaluation  DIAGNOSTIC COMMENTS: The aspirate shows abundant colloid with scattered follicular groups and macrophages consistent with a benign follicular/colloid nodule (follicular nodular disease).  GROSS: Received is/are 6 slides in 95% Ethyl alcohol, and 30ccs of pale beige Cytolyt solution. (TS:ts) Prepared: Smears:  6 Concentration Method (Thin Prep):  1 Cell Block:  Cell block attempted, not obtained. Additional Studies:  Afirma collected.     Final Diagnosis performed by Prentice Pitcher, MD.   Electronically signed 06/21/2022 Technical component performed at [facility], [address], [place], [place] [address].  Professional component performed at [employer]. [facility], [address], [place], [place] [address].  Immunohistochemistry Technical component (if applicable) was performed at [facility]. [address], [address], [place], [place] [address].   IMMUNOHISTOCHEMISTRY DISCLAIMER (if applicable): Some of these immunohistochemical stains may have been developed and the performance characteristics determine by [facility]. Some may not have been cleared or approved by the U.S. Food and Drug Administration. The FDA has determined that such clearance or approval is not necessary. This test is used for clinical purposes. It should not be regarded as investigational or for research. This laboratory is certified under the Clinical Laboratory Improvement Amendments of 1988 (CLIA-88) as qualified to perform high complexity clinical laboratory testing.  The controls stained appropriately.  ------------------------------------------------------------------------------------------------------------------------- Thyroid  US  from 01/20/23  CLINICAL DATA:  74 year old male with multinodular thyroid    EXAM: THYROID  ULTRASOUND   TECHNIQUE: Ultrasound examination of the thyroid  gland and adjacent soft tissues was performed.   COMPARISON:  01/21/2022, biopsy right inferior thyroid  nodule 03/13/2022   Biopsy right inferior thyroid  nodule 06/19/2022   FINDINGS: Parenchymal Echotexture: Moderately heterogenous   Isthmus: 0.8 cm   Right lobe: 6.2 cm x 2.3 cm x 2.0 cm   Left lobe: 6.7 cm x 2.9 cm x 2.9 cm   _________________________________________________________   Estimated total number of nodules >/= 1 cm: 6-10   Number of spongiform nodules >/=  2 cm not described below (TR1): 0   Number of mixed cystic and solid nodules >/= 1.5 cm not described below (TR2): 0   _________________________________________________________   Nodule labeled 1 in the superior right thyroid , 1.6 cm, previously 1.7 cm. Nodule has clear spongiform characteristics on current and previous and does not meet criteria for surveillance.   Nodule labeled 2 mid right thyroid , 1.4 cm. Nodule has clear spongiform  characteristics on the prior and the current and does not meet criteria for surveillance.   Nodule labeled 3 inferior right thyroid , 3.4 cm. Nodule has undergone biopsy on 2 prior occasion, 06/19/2022, 03/13/2022. Assuming benign result no further specific follow-up would be indicated.   Nodule labeled 4 on the current, isthmus, 1.5 cm and unchanged. This nodule has TR 2/cystic characteristics and does not meet criteria for surveillance.   Nodule labeled 5 superior left thyroid , 2.4 cm. Nodule has a differing measurement to the prior of 1.5 cm, attributable to inclusion of heterogeneous tissue adjacent to the nodule on the current. The cystic nodule appears unchanged to the prior and given the TR 2 characteristics does not meet criteria for surveillance.   Nodule labeled 6, inferior left thyroid , 1.5 cm. This nodule remains TR 2/cystic and does not meet criteria for surveillance.   No adenopathy   IMPRESSION: Similar appearance of multi cystic/nodular thyroid  as above.   Assuming benign biopsy of inferior right thyroid  nodule (labeled 3), no further specific follow-up would be indicated.   Above recommendations follow those established by the new ACR TI-RADS criteria (J Am Coll Radiol 2017;14:587-595).     Electronically Signed   By: Ami Bellman D.O.   On: 01/20/2023 12:46   Latest Reference Range & Units 04/17/18 08:29 02/29/20 10:22 09/21/20 09:30 03/04/22 09:01 05/22/22 09:25 02/12/23 07:48  TSH 0.450 - 4.500 uIU/mL 0.668 0.166 (L) 0.17 (L) 0.434 (L)  0.601 0.441 (L)  Triiodothyronine,Free,Serum 2.0 - 4.4 pg/mL    3.2 3.1 3.0  T4,Free(Direct) 0.82 - 1.77 ng/dL  8.79 9.17 9.00 8.68 8.87  Thyroxine (T4) 4.5 - 12.0 ug/dL 9.2       Thyroperoxidase Ab SerPl-aCnc 0 - 34 IU/mL    10    Thyroglobulin Antibody 0.0 - 0.9 IU/mL    <1.0    (L): Data is abnormally  low  ----------------------------------------------------------------------------------------------------------------------  ASSESSMENT / PLAN:  1. Thyroid  Nodule  -He had FNA on 03/12/22 to assess for malignancy of mildly suspicious TR3 nodule which unfortunately only had scant cellularity present, therefore malignancy could not be ruled out.  This biopsy was repeated once inflammation had decreased and was determined to be benign.  The rest of the nodules found on previous ultrasound do not meet criteria for additional follow up.  Repeat US  from 01/20/23 shows stable cystic multinodular thyroid  gland.  No further follow up is needed at this time.  His thyroid  antibody testing was negative, ruling out autoimmune thyroid  dysfunction.    His previous thyroid  function tests show euthyroid presentation.  He will not need any anti-thyroid  treatment or thyroid  hormone replacement at this time.    2. Type 2 Diabetes with stage 2 chronic kidney disease with long term use of insulin   He presents today with his CGM showing improving, mostly at target glycemic profile overall.  His POCT A1c today is 8.6%, improving from last visit of 9.7%.  Analysis of his CGM shows TIR 53%, TAR 47%, TBR <1% with a GMI of 7.8%.    - Patient has currently uncontrolled symptomatic type 2 DM since 74 years of age.   -Recent labs reviewed.  - I had a long discussion with him about the progressive nature of diabetes and the pathology behind its complications. -His diabetes is complicated by CVA and MI in the past and he remains at a high risk for more acute and chronic complications which include CAD, CVA, CKD, retinopathy, and neuropathy. These are all discussed in detail with him.  The following Lifestyle Medicine recommendations according to American College of Lifestyle Medicine [facility]) were discussed and offered to patient and he agrees to start the journey:  A. Whole Foods, Plant-based plate comprising of fruits and  vegetables, plant-based proteins, whole-grain carbohydrates was discussed in detail with the patient.   A list for source of those nutrients were also provided to the patient.  Patient will use only water  or unsweetened tea for hydration. B.  The need to stay away from risky substances including alcohol, smoking; obtaining 7 to 9 hours of restorative sleep, at least 150 minutes of moderate intensity exercise weekly, the importance of healthy social connections,  and stress reduction techniques were discussed. C.  A full color page of  Calorie density of various food groups per pound showing examples of each food groups was provided to the patient.  - Nutritional counseling repeated/built upon at each appointment.  - The patient admits there is a room for improvement in their diet and drink choices. -  Suggestion is made for the patient to avoid simple carbohydrates from their diet including Cakes, Sweet Desserts / Pastries, Ice Cream, Soda (diet and regular), Sweet Tea, Candies, Chips, Cookies, Sweet Pastries, Store Bought Juices, Alcohol in Excess of 1-2 drinks a day, Artificial Sweeteners, Coffee Creamer, and Sugar-free Products. This will help patient to have stable blood glucose profile and potentially avoid unintended weight gain.   - I encouraged the patient to  switch to unprocessed or minimally processed complex starch and increased protein intake (animal or plant source), fruits, and vegetables.   - Patient is advised to stick to a routine mealtimes to eat 3 meals a day and avoid unnecessary snacks (to snack only to correct hypoglycemia).  - He will be scheduled with Penny Crumpton, RDN, CDE for diabetes education.  - I have approached him with the following individualized plan to manage his diabetes and patient agrees:   -Due to his improving glycemic profile, no changes will be made to his regimen today.  I did encouraged him to eat his larger meal at lunch rather than supper.  He is  advised to continue his Tresiba  40 units SQ nightly and Metformin  500 mg po twice daily, and Glimepiride  2 mg po daily at breakfast.    -He is encouraged to continue monitoring glucose 2 times daily (using his CGM), before breakfast and before bed, and to call the clinic if he has readings less than 70 or above 300 for 3 tests in a row.   - He is warned not to take insulin  without proper monitoring per orders. - Adjustment parameters are given to him for hypo and hyperglycemia in writing.  - He will be considered for incretin therapy as appropriate next visit.  - Specific targets for  A1c; LDL, HDL, and Triglycerides were discussed with the patient.   FOLLOW UP PLAN: Return in about 4 months (around 06/10/2024) for Diabetes F/U with A1c in office, No previsit labs, Bring meter and logs.      I spent  25  minutes in the care of the patient today including review of labs from CMP, Lipids, Thyroid  Function, Hematology (current and previous including abstractions from other facilities); face-to-face time discussing  his blood glucose readings/logs, discussing hypoglycemia and hyperglycemia episodes and symptoms, medications doses, his options of short and long term treatment based on the latest standards of care / guidelines;  discussion about incorporating lifestyle medicine;  and documenting the encounter. Risk reduction counseling performed per USPSTF guidelines to reduce obesity and cardiovascular risk factors.     Please refer to Patient Instructions for Blood Glucose Monitoring and Insulin /Medications Dosing Guide  in media tab for additional information. Please  also refer to  Patient Self Inventory in the Media  tab for reviewed elements of pertinent patient history.  Tanda JAYSON Buffalo participated in the discussions, expressed understanding, and voiced agreement with the above plans.  All questions were answered to his satisfaction. he is encouraged to contact clinic should he have any  questions or concerns prior to his return visit.   Benton Rio, Ku [facility] [address] [place], [place] [address] Phone: [phone] Fax: [phone] "

## 2024-02-12 DIAGNOSIS — F419 Anxiety disorder, unspecified: Secondary | ICD-10-CM | POA: Insufficient documentation

## 2024-02-12 NOTE — Assessment & Plan Note (Signed)
 Diabetic neuropathy with shooting leg pain, possibly sciatic. Hyperlipidemia managed with Zetia . - Sent prescription for duloxetine  to [facility]. - Sent prescription for Zetia  to [facility].

## 2024-02-12 NOTE — Assessment & Plan Note (Signed)
 Managed with Xanax , taken once daily. - PDMP reviewed.  - Sent prescription for Xanax  to [facility].

## 2024-02-12 NOTE — Assessment & Plan Note (Signed)
 A1c levels decreased slightly. Glimepiride  used for management. - Sent prescription for glimepiride  to [facility].

## 2024-02-16 ENCOUNTER — Ambulatory Visit: Attending: Nurse Practitioner | Admitting: Nurse Practitioner

## 2024-02-16 ENCOUNTER — Other Ambulatory Visit: Payer: Self-pay | Admitting: Nurse Practitioner

## 2024-02-16 ENCOUNTER — Encounter: Payer: Self-pay | Admitting: Nurse Practitioner

## 2024-02-16 VITALS — BP 124/62 | HR 71 | Ht 71.0 in | Wt 226.6 lb

## 2024-02-16 DIAGNOSIS — Z87898 Personal history of other specified conditions: Secondary | ICD-10-CM

## 2024-02-16 DIAGNOSIS — I4891 Unspecified atrial fibrillation: Secondary | ICD-10-CM | POA: Diagnosis not present

## 2024-02-16 DIAGNOSIS — Z8679 Personal history of other diseases of the circulatory system: Secondary | ICD-10-CM

## 2024-02-16 DIAGNOSIS — R600 Localized edema: Secondary | ICD-10-CM

## 2024-02-16 DIAGNOSIS — I5032 Chronic diastolic (congestive) heart failure: Secondary | ICD-10-CM | POA: Diagnosis not present

## 2024-02-16 DIAGNOSIS — E785 Hyperlipidemia, unspecified: Secondary | ICD-10-CM | POA: Diagnosis not present

## 2024-02-16 DIAGNOSIS — I10 Essential (primary) hypertension: Secondary | ICD-10-CM

## 2024-02-16 DIAGNOSIS — Z8674 Personal history of sudden cardiac arrest: Secondary | ICD-10-CM | POA: Diagnosis not present

## 2024-02-16 DIAGNOSIS — I2510 Atherosclerotic heart disease of native coronary artery without angina pectoris: Secondary | ICD-10-CM

## 2024-02-16 NOTE — Progress Notes (Unsigned)
 " Cardiology Office Note   Date:  12/02/2023 ID:  Xavier Caldwell, DOB Aug 30, 1950, MRN [id number] PCP: Bevely Doffing, FNP  [facility] Providers Cardiologist:  Alvan Carrier, MD     History of Present Illness Xavier Caldwell is a 74 y.o. male with a PMH of CAD, s/p STEMI in 2020 and complicated by VT arrest, hx of chest pain, HTN, HLD, chronic HFpEF, A-fib, carotid artery stenosis, s/p bilateral CEA's (followed by [facility]), dizziness, hx of hyperkalemia/AKI, who presents today for 6 week follow-up.   Previous cardiovascular history of inferior STEMI in 2020 and received drug-eluting stent to RCA, complicated by VT arrest requiring defib in the Cath Lab and by bradycardia, and requiring temporary pacing at the time.  Echo around that time showed LVEF 45 to 50%.  Most recent NST in June 2025 was negative for ischemia.  Last seen by Dr. Alvan on 08/13/2023. Pt noted some ongoing edema, and lasix  was increased to 60 mg in AM, 40 mg in PM, and bloodwork arranged to be checked in 2 weeks.   10/09/2023 - Today he presents for 6 week follow-up. He states he is doing the same since last office visit.  Leg swelling remains the same.  Taking Lasix  as instructed from last office visit. Denies any chest pain, palpitations, syncope, presyncope, dizziness, orthopnea, PND, significant weight changes, acute bleeding, or claudication.  12/02/2023 - Here for follow-up. Doing well.  Tells me he stopped taking torsemide  as he noticed that his urine was rust color and has not returned to starting this medication again.  Denies any UTI symptoms.  Denies any chest pain, shortness of breath, palpitations, syncope, presyncope, dizziness, orthopnea, PND, significant weight changes, acute bleeding, or claudication.  ROS: Negative.  See HPI.  Studies Reviewed  EKG: No EKG is ordered today.   Carotid duplex 10/2023: Summary:  Right Carotid: There is no evidence of stenosis in the right ICA.   Left Carotid: There is no  evidence of stenosis in the left ICA.   Vertebrals:  Bilateral vertebral arteries demonstrate antegrade flow.  Subclavians: Normal flow hemodynamics were seen in bilateral subclavian               arteries.   *See table(s) above for measurements and observations.    Lexiscan  07/2023:   The study is normal. There are no perfusion defects consistent with prior infarct or current ischemia.  The study is low risk.   No ST deviation was noted.   LV perfusion is normal.   Left ventricular function is normal. Nuclear stress EF: 59%. The left ventricular ejection fraction is normal (55-65%). End diastolic cavity size is normal.   There is a small mild intensity apical defect present in the resting images only with normal wall motion consistent with apical thinning.   Echocardiogram 05/2022: 1. Left ventricular ejection fraction, by estimation, is 55 to 60%. The  left ventricle has normal function. Left ventricular endocardial border  not optimally defined to evaluate regional wall motion. There is moderate  left ventricular hypertrophy. Left  ventricular diastolic parameters are indeterminate.   2. Right ventricular systolic function is normal. The right ventricular  size is normal. Tricuspid regurgitation signal is inadequate for assessing  PA pressure.   3. Left atrial size was severely dilated.   4. Right atrial size was severely dilated.   5. The mitral valve is normal in structure. No evidence of mitral valve  regurgitation. No evidence of mitral stenosis.   6. The  aortic valve is tricuspid. Aortic valve regurgitation is not  visualized. No aortic stenosis is present.   7. The pulmonic valve was abnormal.   8. The inferior vena cava is dilated in size with <50% respiratory  variability, suggesting right atrial pressure of 15 mmHg.   ABI's 03/2020:  Summary:  Right: Resting right ankle-brachial index indicates moderate right lower  extremity arterial disease. The right toe-brachial  index is abnormal.   Left: Resting left ankle-brachial index is within normal range. No  evidence of significant left lower extremity arterial disease. The left  toe-brachial index is abnormal.    *See table(s) above for measurements and observations.    LHC 04/2018:  Prox RCA lesion is 100% stenosed. Ost LAD to Prox LAD lesion is 30% stenosed. Mid LAD lesion is 40% stenosed. Ost 2nd Mrg to 2nd Mrg lesion is 40% stenosed. A drug-eluting stent was successfully placed using a STENT RESOLUTE ONYX 3.5X30. Post intervention, there is a 0% residual stenosis.   1. 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 Ventricular fibrillation/cardiac arrest with successful resuscitation 3. Successful PTCA/DES x 1 proximal RCA.  4. Successful aspiration thrombectomy distal RCA 5. Mild to moderate non-obstructive disease in the Circumflex and LAD   Recommendations: Will admit to ICU. Echo in the am. Will continue Aggrastat  for 18 hours. Will continue ASA/Brilinta  and statin. Will not start a beta blocker today given hypotension and bradycardia. Ace inh on hold with hypotension.    Physical Exam VS:  BP 124/62 (BP Location: Left Arm)   Pulse 71   Ht 5' 11 (1.803 m)   Wt 226 lb 9.6 oz (102.8 kg)   SpO2 96%   BMI 31.60 kg/m        Wt Readings from Last 3 Encounters:  02/16/24 226 lb 9.6 oz (102.8 kg)  02/11/24 227 lb 0.6 oz (103 kg)  02/11/24 228 lb (103.4 kg)    GEN: Well nourished, well developed in no acute distress NECK: No JVD; No carotid bruits CARDIAC: S1/S2, RRR, no murmurs, rubs, gallops RESPIRATORY:  Clear to auscultation without rales, wheezing or rhonchi  ABDOMEN: Soft, non-tender, non-distended EXTREMITIES:  Nonpitting edema to BLE; No deformity   ASSESSMENT AND PLAN  Chronic HFpEF, leg edema, hematuria?, medication management Stage C, NYHA class I-II symptoms.  EF normal at 55 to 60% in April 2024.  Does show some continued leg edema.   Instructed him to restart torsemide  20 mg once daily and obtain CBC and BMET in 1 to 2 weeks.  Continue rest of medication regimen. Low sodium diet, fluid restriction <2L, and daily weights encouraged. Educated to contact our office for weight gain of 2 lbs overnight or 5 lbs in one week.  CAD, s/p STEMI, complicated by VT arrest and bradycardia Stable with no anginal symptoms. No indication for ischemic evaluation.  No medication changes at this time besides what is noted above. Heart healthy diet and regular cardiovascular exercise encouraged.   A-fib Denies any tachycardia or palpitations.  Heart rate is well-controlled today.  Continue current medication regimen.  Eliquis  was switched to Pradaxa  due to cost.  Denies any bleeding issues and doing well on current medication regimen.  No medication changes at this time.  4. HTN Blood pressure stable and at goal. Discussed to monitor BP at home at least 2 hours after medications and sitting for 5-10 minutes.  No medication changes at this time. Heart healthy diet and regular cardiovascular exercise encouraged.  5. HLD LDL 46 in January 2025, mildly elevated triglyceride counts.  Recommended heart healthy diet, Mediterranean diet.  Continue current medication regimen.  Continue follow-up with PCP.  6.  Carotid artery stenosis, s/p bilateral CEA's Most recent carotid duplex revealed no stenosis along bilateral ICAs.  Continue follow-up with [facility].     Dispo: Care and ED precautions discussed.  Follow-up with MD/APP in 2-3 months or sooner if anything changes.  Signed, Almarie Crate, NP   "

## 2024-02-16 NOTE — Patient Instructions (Signed)

## 2024-02-22 ENCOUNTER — Other Ambulatory Visit (HOSPITAL_COMMUNITY): Payer: Self-pay

## 2024-02-23 ENCOUNTER — Other Ambulatory Visit (HOSPITAL_COMMUNITY): Payer: Self-pay

## 2024-02-23 ENCOUNTER — Telehealth: Payer: Self-pay | Admitting: *Deleted

## 2024-02-23 ENCOUNTER — Telehealth: Payer: Self-pay | Admitting: Nurse Practitioner

## 2024-02-23 NOTE — Telephone Encounter (Signed)
 Patient has left a message that his insurance has sent over a PA for his Dexcom senors. They advised the patient that they had sent it to us  and only rec'd a portion back, patient states that if they do not get what they need, they are going to deny it.  Will send this to the PA team to see if they have seen this and can follow up on this

## 2024-02-24 ENCOUNTER — Telehealth: Payer: Self-pay | Admitting: Pharmacy Technician

## 2024-02-24 NOTE — Telephone Encounter (Signed)
 Pharmacy Patient Advocate Encounter   Received notification from [facility] that prior authorization for Dexcom G7 Sensor  is required/requested.   Insurance verification completed.   The patient is insured through [facility].   Per test claim: PA required; PA submitted to above mentioned insurance via Latent Key/confirmation #/EOC [id number] Status is pending

## 2024-02-25 ENCOUNTER — Other Ambulatory Visit (HOSPITAL_COMMUNITY): Payer: Self-pay

## 2024-02-25 NOTE — Telephone Encounter (Signed)
 Pharmacy Patient Advocate Encounter  Received notification from [facility] that Prior Authorization for Dexcom G7 Sensor  has been APPROVED from 02/24/24 to 02/03/25. Ran test claim, Copay is $0.00. This test claim was processed through [facility]- copay amounts may vary at other pharmacies due to pharmacy/plan contracts, or as the patient moves through the different stages of their insurance plan.   PA #/Case ID/Reference #: [id number]

## 2024-02-25 NOTE — Telephone Encounter (Signed)
 PA team responded in separate encounter

## 2024-03-02 ENCOUNTER — Telehealth: Payer: Self-pay | Admitting: *Deleted

## 2024-03-02 NOTE — Telephone Encounter (Signed)
 Patient's wife left a message asking about the patient's Dexcom Sensors.  She said in her message that a PA was needed and that it needed to go to [facility].  Earlier today we received a fax from [employer] and they were requesting their paper work and office notes sent to them for this. This was completed and faxed to them.  I returned the call to Ms. Cleatus. I shared this with her. I looked in the patient's chart and see where a PA was completed through [employer] and was approved on 02/24/2024. I shared this with his wife.  She then shared that the number she had called told her that they had not received anything. Further discussion, she was saying that it was [employer].  I called and spoke with a representative, and when I shared all of this with her, she said that it could take 24-48 hours before they received the necessary paper work. She has marked it Urgent and is aware that patient is out of sensors.  I called the [employer] back and left a detailed message sharing this.

## 2024-03-03 ENCOUNTER — Telehealth: Payer: Self-pay | Admitting: Nurse Practitioner

## 2024-03-03 NOTE — Telephone Encounter (Signed)
 LVM to let pt know PAP of Tresiba  is here for pick up

## 2024-03-04 ENCOUNTER — Other Ambulatory Visit: Payer: Self-pay

## 2024-03-04 ENCOUNTER — Other Ambulatory Visit (HOSPITAL_COMMUNITY): Payer: Self-pay

## 2024-03-04 NOTE — Telephone Encounter (Signed)
 Pts wife picked up PAP of Tresiba 

## 2024-03-07 ENCOUNTER — Other Ambulatory Visit (HOSPITAL_COMMUNITY): Payer: Self-pay

## 2024-03-09 ENCOUNTER — Other Ambulatory Visit (HOSPITAL_COMMUNITY): Payer: Self-pay

## 2024-03-11 ENCOUNTER — Other Ambulatory Visit: Payer: Self-pay

## 2024-04-08 ENCOUNTER — Ambulatory Visit: Admitting: Gastroenterology

## 2024-06-10 ENCOUNTER — Ambulatory Visit: Admitting: Nurse Practitioner

## 2024-08-11 ENCOUNTER — Ambulatory Visit: Payer: Self-pay
